# Patient Record
Sex: Female | Born: 1950
Health system: Southern US, Community
[De-identification: ages and names within clinical notes are randomized; demographics above are authoritative.]

## PROBLEM LIST (undated history)

## (undated) DIAGNOSIS — K219 Gastro-esophageal reflux disease without esophagitis: Secondary | ICD-10-CM

## (undated) DIAGNOSIS — F329 Major depressive disorder, single episode, unspecified: Secondary | ICD-10-CM

## (undated) DIAGNOSIS — F410 Panic disorder [episodic paroxysmal anxiety] without agoraphobia: Secondary | ICD-10-CM

## (undated) DIAGNOSIS — R42 Dizziness and giddiness: Secondary | ICD-10-CM

## (undated) DIAGNOSIS — N63 Unspecified lump in unspecified breast: Secondary | ICD-10-CM

## (undated) DIAGNOSIS — R238 Other skin changes: Secondary | ICD-10-CM

## (undated) DIAGNOSIS — H5347 Heteronymous bilateral field defects: Secondary | ICD-10-CM

## (undated) DIAGNOSIS — K5090 Crohn's disease, unspecified, without complications: Secondary | ICD-10-CM

## (undated) DIAGNOSIS — Z9889 Other specified postprocedural states: Secondary | ICD-10-CM

## (undated) DIAGNOSIS — R112 Nausea with vomiting, unspecified: Secondary | ICD-10-CM

## (undated) DIAGNOSIS — E119 Type 2 diabetes mellitus without complications: Secondary | ICD-10-CM

## (undated) DIAGNOSIS — R233 Spontaneous ecchymoses: Secondary | ICD-10-CM

## (undated) DIAGNOSIS — I639 Cerebral infarction, unspecified: Secondary | ICD-10-CM

## (undated) DIAGNOSIS — E039 Hypothyroidism, unspecified: Secondary | ICD-10-CM

## (undated) DIAGNOSIS — R269 Unspecified abnormalities of gait and mobility: Secondary | ICD-10-CM

## (undated) DIAGNOSIS — E785 Hyperlipidemia, unspecified: Secondary | ICD-10-CM

## (undated) DIAGNOSIS — I69398 Other sequelae of cerebral infarction: Principal | ICD-10-CM

## (undated) DIAGNOSIS — I69359 Hemiplegia and hemiparesis following cerebral infarction affecting unspecified side: Principal | ICD-10-CM

## (undated) DIAGNOSIS — I10 Essential (primary) hypertension: Secondary | ICD-10-CM

## (undated) DIAGNOSIS — F32A Depression, unspecified: Secondary | ICD-10-CM

## (undated) HISTORY — DX: Other skin changes: R23.8

## (undated) HISTORY — DX: Unspecified lump in unspecified breast: N63.0

## (undated) HISTORY — DX: Essential (primary) hypertension: I10

## (undated) HISTORY — DX: Panic disorder (episodic paroxysmal anxiety): F41.0

## (undated) HISTORY — DX: Hyperlipidemia, unspecified: E78.5

## (undated) HISTORY — DX: Spontaneous ecchymoses: R23.3

## (undated) HISTORY — DX: Crohn's disease, unspecified, without complications: K50.90

## (undated) HISTORY — DX: Hemiplegia and hemiparesis following cerebral infarction affecting unspecified side: I69.359

## (undated) HISTORY — DX: Other sequelae of cerebral infarction: I69.398

## (undated) HISTORY — DX: Depression, unspecified: F32.A

## (undated) HISTORY — DX: Unspecified abnormalities of gait and mobility: R26.9

## (undated) HISTORY — DX: Cerebral infarction, unspecified: I63.9

## (undated) HISTORY — DX: Gastro-esophageal reflux disease without esophagitis: K21.9

## (undated) HISTORY — DX: Major depressive disorder, single episode, unspecified: F32.9

## (undated) HISTORY — DX: Dizziness and giddiness: R42

---

## 1981-02-24 HISTORY — PX: TUBAL LIGATION: SHX77

## 1984-02-25 HISTORY — PX: APPENDECTOMY: SHX54

## 1984-02-25 HISTORY — PX: CHOLECYSTECTOMY: SHX55

## 1988-10-25 HISTORY — PX: BREAST LUMPECTOMY: SHX2

## 1991-05-26 HISTORY — PX: SMALL INTESTINE SURGERY: SHX150

## 1999-12-13 ENCOUNTER — Other Ambulatory Visit: Admission: RE | Admit: 1999-12-13 | Discharge: 1999-12-13 | Payer: Self-pay | Admitting: *Deleted

## 2000-10-13 ENCOUNTER — Encounter: Payer: Self-pay | Admitting: Gastroenterology

## 2000-10-14 ENCOUNTER — Inpatient Hospital Stay (HOSPITAL_COMMUNITY): Admission: EM | Admit: 2000-10-14 | Discharge: 2000-10-17 | Payer: Self-pay | Admitting: Emergency Medicine

## 2000-10-14 ENCOUNTER — Encounter: Payer: Self-pay | Admitting: Gastroenterology

## 2000-10-15 ENCOUNTER — Encounter: Payer: Self-pay | Admitting: General Surgery

## 2000-10-16 ENCOUNTER — Encounter: Payer: Self-pay | Admitting: Cardiology

## 2000-10-16 ENCOUNTER — Encounter: Payer: Self-pay | Admitting: Gastroenterology

## 2000-11-26 ENCOUNTER — Encounter: Admission: RE | Admit: 2000-11-26 | Discharge: 2000-11-26 | Payer: Self-pay | Admitting: Gastroenterology

## 2000-11-26 ENCOUNTER — Encounter: Payer: Self-pay | Admitting: Gastroenterology

## 2001-04-13 ENCOUNTER — Encounter: Payer: Self-pay | Admitting: Gastroenterology

## 2001-04-13 ENCOUNTER — Inpatient Hospital Stay (HOSPITAL_COMMUNITY): Admission: EM | Admit: 2001-04-13 | Discharge: 2001-04-15 | Payer: Self-pay | Admitting: Gastroenterology

## 2001-04-14 ENCOUNTER — Encounter: Payer: Self-pay | Admitting: General Surgery

## 2001-04-15 ENCOUNTER — Encounter: Payer: Self-pay | Admitting: Gastroenterology

## 2001-08-05 ENCOUNTER — Ambulatory Visit (HOSPITAL_COMMUNITY): Admission: RE | Admit: 2001-08-05 | Discharge: 2001-08-05 | Payer: Self-pay | Admitting: Gastroenterology

## 2002-05-16 ENCOUNTER — Other Ambulatory Visit: Admission: RE | Admit: 2002-05-16 | Discharge: 2002-05-16 | Payer: Self-pay | Admitting: *Deleted

## 2003-06-28 ENCOUNTER — Other Ambulatory Visit: Admission: RE | Admit: 2003-06-28 | Discharge: 2003-06-28 | Payer: Self-pay | Admitting: *Deleted

## 2004-01-24 ENCOUNTER — Encounter: Admission: RE | Admit: 2004-01-24 | Discharge: 2004-01-24 | Payer: Self-pay | Admitting: *Deleted

## 2004-02-25 HISTORY — PX: BREAST BIOPSY: SHX20

## 2004-05-07 ENCOUNTER — Emergency Department: Payer: Self-pay | Admitting: Emergency Medicine

## 2004-11-27 ENCOUNTER — Observation Stay (HOSPITAL_COMMUNITY): Admission: EM | Admit: 2004-11-27 | Discharge: 2004-11-28 | Payer: Self-pay | Admitting: Emergency Medicine

## 2005-02-07 ENCOUNTER — Encounter: Admission: RE | Admit: 2005-02-07 | Discharge: 2005-02-07 | Payer: Self-pay | Admitting: *Deleted

## 2005-05-13 ENCOUNTER — Ambulatory Visit: Payer: Self-pay | Admitting: General Practice

## 2005-05-25 ENCOUNTER — Ambulatory Visit: Payer: Self-pay | Admitting: General Practice

## 2005-06-08 ENCOUNTER — Emergency Department (HOSPITAL_COMMUNITY): Admission: EM | Admit: 2005-06-08 | Discharge: 2005-06-08 | Payer: Self-pay | Admitting: Emergency Medicine

## 2005-08-22 ENCOUNTER — Emergency Department: Payer: Self-pay | Admitting: Emergency Medicine

## 2006-02-24 HISTORY — PX: ANAL FISSURE REPAIR: SHX2312

## 2006-04-05 ENCOUNTER — Inpatient Hospital Stay: Payer: Self-pay | Admitting: Internal Medicine

## 2006-04-09 ENCOUNTER — Encounter: Admission: RE | Admit: 2006-04-09 | Discharge: 2006-04-09 | Payer: Self-pay | Admitting: *Deleted

## 2006-04-28 ENCOUNTER — Encounter: Admission: RE | Admit: 2006-04-28 | Discharge: 2006-04-28 | Payer: Self-pay | Admitting: *Deleted

## 2006-06-24 ENCOUNTER — Encounter: Admission: RE | Admit: 2006-06-24 | Discharge: 2006-06-24 | Payer: Self-pay | Admitting: Gastroenterology

## 2006-06-29 ENCOUNTER — Encounter: Admission: RE | Admit: 2006-06-29 | Discharge: 2006-06-29 | Payer: Self-pay | Admitting: Gastroenterology

## 2006-06-30 ENCOUNTER — Ambulatory Visit (HOSPITAL_COMMUNITY): Admission: AD | Admit: 2006-06-30 | Discharge: 2006-06-30 | Payer: Self-pay | Admitting: *Deleted

## 2006-09-23 ENCOUNTER — Ambulatory Visit (HOSPITAL_COMMUNITY): Admission: RE | Admit: 2006-09-23 | Discharge: 2006-09-23 | Payer: Self-pay | Admitting: *Deleted

## 2007-02-25 DIAGNOSIS — I639 Cerebral infarction, unspecified: Secondary | ICD-10-CM

## 2007-02-25 HISTORY — DX: Cerebral infarction, unspecified: I63.9

## 2007-03-22 ENCOUNTER — Ambulatory Visit: Payer: Self-pay | Admitting: Endocrinology

## 2007-05-06 ENCOUNTER — Ambulatory Visit: Payer: Self-pay | Admitting: Unknown Physician Specialty

## 2007-05-20 ENCOUNTER — Emergency Department: Payer: Self-pay | Admitting: Emergency Medicine

## 2007-05-21 ENCOUNTER — Inpatient Hospital Stay: Payer: Self-pay | Admitting: Internal Medicine

## 2007-05-21 ENCOUNTER — Other Ambulatory Visit: Payer: Self-pay

## 2007-05-26 ENCOUNTER — Ambulatory Visit: Payer: Self-pay | Admitting: Physical Medicine & Rehabilitation

## 2007-05-26 ENCOUNTER — Inpatient Hospital Stay (HOSPITAL_COMMUNITY)
Admission: AD | Admit: 2007-05-26 | Discharge: 2007-06-17 | Payer: Self-pay | Admitting: Physical Medicine & Rehabilitation

## 2007-06-17 ENCOUNTER — Encounter: Payer: Self-pay | Admitting: Internal Medicine

## 2007-06-25 ENCOUNTER — Encounter: Payer: Self-pay | Admitting: Internal Medicine

## 2007-07-26 ENCOUNTER — Encounter: Payer: Self-pay | Admitting: Internal Medicine

## 2007-07-29 ENCOUNTER — Encounter: Payer: Self-pay | Admitting: Internal Medicine

## 2007-08-25 ENCOUNTER — Encounter: Payer: Self-pay | Admitting: Internal Medicine

## 2007-09-09 ENCOUNTER — Encounter
Admission: RE | Admit: 2007-09-09 | Discharge: 2007-09-10 | Payer: Self-pay | Admitting: Physical Medicine & Rehabilitation

## 2007-09-10 ENCOUNTER — Ambulatory Visit: Payer: Self-pay | Admitting: Physical Medicine & Rehabilitation

## 2007-09-25 ENCOUNTER — Encounter: Payer: Self-pay | Admitting: Internal Medicine

## 2007-10-26 ENCOUNTER — Encounter: Payer: Self-pay | Admitting: Internal Medicine

## 2007-11-25 ENCOUNTER — Encounter: Payer: Self-pay | Admitting: Internal Medicine

## 2007-12-26 ENCOUNTER — Encounter: Payer: Self-pay | Admitting: Internal Medicine

## 2008-01-25 ENCOUNTER — Encounter: Payer: Self-pay | Admitting: Internal Medicine

## 2008-02-25 ENCOUNTER — Encounter: Payer: Self-pay | Admitting: Internal Medicine

## 2008-03-27 ENCOUNTER — Encounter: Payer: Self-pay | Admitting: Internal Medicine

## 2008-04-24 ENCOUNTER — Encounter: Payer: Self-pay | Admitting: Internal Medicine

## 2008-05-25 ENCOUNTER — Encounter: Payer: Self-pay | Admitting: Internal Medicine

## 2008-06-24 ENCOUNTER — Encounter: Payer: Self-pay | Admitting: Internal Medicine

## 2008-07-25 ENCOUNTER — Encounter: Payer: Self-pay | Admitting: Internal Medicine

## 2008-08-24 ENCOUNTER — Encounter: Payer: Self-pay | Admitting: Internal Medicine

## 2010-07-09 NOTE — Op Note (Signed)
NAMEERYNNE, KEALEY                 ACCOUNT NO.:  [id number]   MEDICAL RECORD NO.:  [id number]          PATIENT TYPE:  AMB   LOCATION:  SDS                          FACILITY:  [facility]   PHYSICIAN:  Jonne Ply, MD   DATE OF BIRTH:  [birth year]   DATE OF PROCEDURE:  09/23/2006  DATE OF DISCHARGE:                               OPERATIVE REPORT   PREOPERATIVE DIAGNOSIS:  History of Crohn's disease with perianal  abscess status post drainage now with fistula en ano.   POSTOPERATIVE DIAGNOSIS:  History of Crohn's disease with perianal  abscess status post drainage now with fistula en ano, trans-sphincteric.   PROCEDURE:  Fistulotomy.   SURGEON:  Jonne Ply, M.D.   ANESTHESIA:  General.   DESCRIPTION:  The patient was taken to the operating room and placed in  the supine position.  The fistulous opening in the right posterior  perianal space was probed with a rectal probe and a small opening was  seen at one of the anal crypts.  The skin and subcutaneous tissue was  opened along with a partial portion of the internal sphincter muscle.  Adequate hemostasis was ensured.  There was no active infection.  Surgicel was placed along with Gelfoam packing.  A sterile dressing was  applied.  The patient tolerated the procedure well and went to the PACU  in good condition.      Jonne Ply, MD  Electronically Signed     KRE/MEDQ  D:  09/23/2006  T:  09/24/2006  Job:  [id number]   cc:   Earle Gell, M.D.

## 2010-07-09 NOTE — Op Note (Signed)
Danielle, [name]                 ACCOUNT NO.:  [id number]   MEDICAL RECORD NO.:  [id number]          PATIENT TYPE:  AMB   LOCATION:  SDS                          FACILITY:  [facility]   PHYSICIAN:  Jonne Ply, MD   DATE OF BIRTH:  1950/06/22   DATE OF PROCEDURE:  09/23/2006  DATE OF DISCHARGE:  09/23/2006                               OPERATIVE REPORT   This is a repeat dictation.   PREOPERATIVE DIAGNOSIS:  Anal fistula with a history of Crohn's disease.   POSTOPERATIVE DIAGNOSIS:  Anal fistula with a history of Crohn's  disease.   PROCEDURE:  Fistulotomy.   ANESTHESIA:  General.   DESCRIPTION:  The patient was taken to the operating room and placed in  the supine position.  After adequate general anesthesia was induced  using endotracheal tube, the patient was placed in the lithotomy  position.  The perianal prep was undertaken with Betadine.  The  fistulous track was identified with an endoscope and a rectal probe.  This was opened with electrocautery through the external sphincter.  Adequate hemostasis was insured.  Gelfoam packing was placed.  The  patient tolerated the procedure well.  Sterile dressing was applied and  she was taken to PACU in good condition.      Jonne Ply, MD  Electronically Signed     KRE/MEDQ  D:  10/22/2006  T:  10/22/2006  Job:  [id number]

## 2010-07-09 NOTE — Assessment & Plan Note (Signed)
[name] returns to clinic today for followup evaluation accompanied by  her husband.  The patient is a 60 year old Caucasian female with a  history of diabetes mellitus along with Crohn disease.  She was admitted  to [facility] on May 21, 2007, with a 3-4 day history  of blurred vision and left facial numbness and tingling.  Initial  cranial CT was negative.  Followup cranial CT showed focal areas of low  attentuation in the left basal ganglia which were thought to be chronic  but no acute changes.  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  No significant  vertebrobasilar or carotid stenosis was identified.  Carotid Dopplers  showed no signs of significant plaque or stenosis.  Lumbar puncture  showed a glucose of 90 with negative culture.  Cardiac echo showed an  ejection fraction of 55% with mild aortic stenosis.  Repeat CT of the  brain on May 23, 2007, showed an involving right temporo-occipital  infarct with chronic small vessel disease.  It was recommended that she  will be placed on Aggrenox for stroke prophylaxis.   The patient stabilized and was moved to the Rehabilitation Unit at [facility] on May 26, 2007.  She remained there until June 17, 2007.   The patient has finished home health therapy and is now doing outpatient  therapy at [facility] including physical and occupational therapy  2 times per week and speech therapy is cutting back.  She did see her  primary care physician, Dr. Wynetta Emery, who apparently stopped the Aggrenox  secondary to potential diarrhea and placed her on aspirin.  She reports  that her blood sugars have been in the 105-135 range.  She still reports  poor vision.  She walks with a cane and uses a wheelchair when she  is  tired.  She walks at least 30 minutes per day.  She is continent of  bowel and bladder and is modified independent with bathing and dressing.   MEDICATIONS:  1. Levoxyl 150 mcg p.o. daily.  2. Protonix 40 mg daily.  3. Aspirin 81 mg daily.  4. Januvia 100 mg daily.  5. Altace 2.5 mg daily.  6. Zocor 10 mg daily.   REVIEW OF SYSTEMS:  Noncontributory.   PHYSICAL EXAMINATION:  GENERAL:  Reasonably well-appearing, middle aged  adult female, in mild-to-no acute discomfort.  VITAL SIGNS:  Not obtained in the office today.  EXTREMITIES:  She had 4+/5 strength throughout the right upper and right  lower extremity.  Left upper extremity strength showed approximately 3  to 3+/5 strength with decreased coordinated movements on the left hand.  Tone was increased in the left upper extremity.  Left lower extremity  strength was essentially 4-/5 with slightly increased tone.   IMPRESSION:  1. Status post right temporo-occipital infarct with left homonymous      hemianopsia and left hemisensory deficits along with left      hemiparesis of the arm and leg.  2. Type 2 diabetes mellitus.  3. Hypertension.  4. Dyslipidemia.  5. History of Crohn disease.  6. Hypothyroidism.  7. Situational depression.   In the office today, we did give the family the name of Dr. Frederico Hamman,  local neuro-ophthalmologist, that they can see in [facility] here for  problems related  to above-noted vision after the stroke.  She will be  continuing in outpatient therapies at this point.  At this point, I do  not think she is going to be able to return to work, and her husband is  considering starting the process to apply for disability.  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  I doubt that she would be able to return to that in the  foreseeable future.  We will plan on seeing the patient in followup on  an as needed basis as she continues to do reasonably well  recovering  from the above-noted stroke.           ______________________________  [name], M.D.     DC/MedQ  D:  09/13/2007 11:23:17  T:  09/14/2007 02:04:02  Job #:  [id number]

## 2010-07-09 NOTE — Discharge Summary (Signed)
[name], [name]                 ACCOUNT NO.:  [id number]   MEDICAL RECORD NO.:  [id number]          PATIENT TYPE:  IPS   LOCATION:  4031                         FACILITY:  [place]   PHYSICIAN:  Jarvis Morgan, M.D.   DATE OF BIRTH:  04-20-1950   DATE OF ADMISSION:  05/26/2007  DATE OF DISCHARGE:  06/17/2007                               DISCHARGE SUMMARY   DISCHARGE DIAGNOSES:  1. Right temporo-occipital cerebrovascular accident with left      hemiparesis, left homonymous hemianopsia, and left hemisensory      deficits, left facial droop, and mild dysphagia.  2. Diabetes mellitus type 2.  3. Hypertension.  4. Dyslipidemia.  5. History of Crohn's disease.  6. Hypothyroid.  7. Constipation, resolving.  8. Situational depression.   [name] is a 60 year old female with a history of diabetes mellitus,  Crohn's disease, admitted to [facility] on May 21, 2007, with a 3-4-day history of blurred vision and left facial numbness  and tingling.  CCT was initially negative.  Followup CCT showed focal  areas of low attenuation of left basal ganglia, it was chronic, no acute  changes.  CTA head and neck showed complete occlusion of proximal right  PCA, mild hypoplasia of left AC, and direct origin of left vertebral  artery from aortic arch.  No significant vertebrobasilar or carotid  stenosis.  Carotid ultrasound showed no plaque or stenosis.  LP done  showed increased glucose at 90, WBC without growth on culture.  Cardiac  echo showed EF of 55% with mild aortic stenosis.  Repeat CT of head on  May 23, 2007, showed evolving right temporo-occipital infarct, chronic  small-vessel disease.  Neuro recommends Aggrenox for CVA prophylaxis,  hypocoagulopathy panel drawn and currently pending.  TEE done showed no  thrombus and no shunt.  The patient continues with blurred vision,  decreased sensation on the left side with hypersensitivity, question  left homonymous  hemianopsia.  The patient has had issues with shortness  of breath and chest pain on May 26, 2007.  Bilateral lower extremity  Dopplers done were negative.  CTA chest negative for PE.  Currently, the  patient continues to have problems with mobility requiring +2 assist  with verbal cues to avoid objects on the left, requires cues to raise  left lower extremity.  Rehab consulted for further therapies.   PAST MEDICAL HISTORY:  Significant for:  1. Crohn's disease.  2. Anal fissure.  3. DM type 2.  4. Hypothyroid.  5. GERD.  6. Anemia in the past.  7. History of small bowel obstruction, requiring resection.  8. Cholecystectomy.  9. C-sections x2.  10.I&D perirectal abscess.   ALLERGIES:  CODEINE AND MORPHINE.   FAMILY HISTORY:  Positive for coronary artery disease and diabetes.   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  Quit tobacco 10 years ago.  Has 10-  pack-year history.  The patient was very active and walking  approximately 2 miles daily.   HOSPITAL COURSE:  [name] was admitted to rehab on May 26, 2007, for inpatient therapies to consist of PT, OT, and speech therapy.  Past admission, she was maintained on Aggrenox for CVA prophylaxis.  She  was noted to have situational depression with high levels of anxiety and  Dr. Valentina Shaggy, neuropsych was consulted for input.  She was noted to have  complaints of constipation and senna-S was added for treatment.   Labs done past admission revealed hemoglobin 12.0, hematocrit 35.8,  white count 9.3, and platelets 229,000.  Check of lytes revealed sodium  137, potassium 3.9, chloride 103, CO2 25, BUN 15, creatinine 0.85, and  glucose 151.  LFTs revealed SGOT 25, SGPT 19, alkaline phosphatase 15,  and total bilirubin 0.4.  The patient's blood sugars were checked on  a.c. and nightly during his  stay.  These have ranged from 80s to 130s  range.  Blood pressures checked on b.i.d. have been stable ranging from  16X to 096E systolic and 45W to 09W diastolic.  The patient has been  continent of bowel and bladder with toileting.  PVRs checked initially  showed evidence of retention.  However, with the patient's improved  mobility and getting the patient to bedside commode, this has resolved  issues with retention.   The patient's lability and depressive mood is much improving.  The  patient is better adjusting to deficits with improved overall outlook.  She did have a followup with General Surgery for her chronic anal  fistula and Dr. Excell Seltzer, who evaluated the patient felt that the  patient meets complete healing of anal fissure.  Therapies have been  ongoing during this stay.  The patient is currently at supervision level  for scoot, pivot, tub transfers, is noted to have increase in dynamic  standing balance with supervision with increase in functional use of  left upper extremity for ADL completion.  She is able to recall her  precautions for ADLs with minimal verbal cues showing increase in  attention to left side to compensate for left homonymous hemianopsia.  Last PT notes revealed the patient had min assist for ambulating 90 feet  with rolling walker with cues for correct left foot placement and  balance.  She is min to mod assist to ambulate around corners with  increase apraxia with tones.  She is able to ambulate up to 100 feet  with rolling walker with left foot Ace wrapped and braced.  Double  upright left AFO was ordered to help with her mobility.  Speech therapy  has been working with the patient to help complete oral motor  therapeutic exercises.  The patient is currently independent for this.  She is at supervision to maintain alternate attention in min distracting  environment.  She is supervision for solving mildly complex problems,  supervision to min assist to  utilize strategies to visually scan to the  left.  Currently, secondary to amount of assistance needed, her family  has elected on SNF.  Bed is available at [facility] for June 17, 2007, and the patient to be discharged to this facility with progressive  PT, OT, and speech therapy to continue past discharge.   DISCHARGE MEDICATIONS:  1. Levothyroxine 150 mcg p.o. per day.  2. Protonix 40 mg a day.  3. Januvia 100 mg p.o. per day.  4. Aggrenox one p.o. b.i.d.  5. Altace 2.5 mg p.o. nightly.  6. Zocor 10 mg p.o.  nightly.  7. Tylenol 650 mg p.o. q.12 hours.  8. Zofran 4 mg p.o. b.i.d.  9. Flexeril 5 mg p.o. nightly.  10.Senokot-S two p.o. b.i.d.  11.MiraLax 17 g and 8 ounces fluid p.o. per day.  12.Dulcolax suppository one p.o. per rectum daily p.r.n., no BM.  13.Ultram 50 mg p.o. q.i.d. p.r.n. pain.   DIET:  Carb modified medium.   SPECIAL INSTRUCTIONS:  A 24-hour supervision assistance as needed.  Continue monitoring blood sugars with a.c. and nightly CBG checks and  adjust oral hypoglycemic as needed.  Routine pressure relief measures.  Progressive PT, OT, and speech therapy to continue past discharge.   FOLLOWUP:  The patient to follow up with LMD for medical issues and for  results regarding coagulopathy panel.  Follow up with Dr. Theda Sers in 4-6  weeks.      Thornton Dales, P.A.    ______________________________  Jarvis Morgan, M.D.    PP/MEDQ  D:  06/16/2007  T:  06/17/2007  Job:  [id number]   cc:   Earle Gell, M.D.

## 2010-07-12 NOTE — Discharge Summary (Signed)
[facility]  Patient:    [name], [name] Visit Number: [id number] MRN: [id number]          Service Type: Attending:  Mickeal Skinner, M.D. Dictated by:   Mickeal Skinner, M.D. Adm. Date:  04/08/01 Disc. Date: 04/15/01   CC:         Marland Kitchen T. Hoxworth, M.D.   Discharge Summary  DISCHARGE DIAGNOSES: 1. Crohns ileitis complicated by early partial small bowel obstruction. 2. Iron-deficiency anemia.  DISCHARGE MEDICATIONS: 1. Pentasa 1 g t.i.d. 2. Aciphex 20 mg q.d. for gastroesophageal reflux. 3. Multivitamin with iron. 4. Cipro 500 mg b.i.d. x6 weeks; then 500 mg q.d. x6 weeks.  FOLLOWUP:  I will reassess Ms. Boan in my office in 4 to 6 weeks.  LABORATORY DATA:  Vitamin B12 level 409.  Serum ferratin 3.  Total cholesterol 156, HDL cholesterol 43, LDL cholesterol 90, triglycerides 117.  Reticulocyte count 64,500.  White blood cell count 12,100, hemoglobin 10.2, platelet count 304,000.  Comprehensive metabolic profile on admission to the hospital was normal except for a random glucose of 147.  SGOT 40.  Admission acute abdominal x-ray series was consistent with an early partial small bowel obstruction with no signs of cardiopulmonary disease.  HOSPITAL COURSE:  Ms. Skyann Ganim is a 60 year old female born [birth year].  She was diagnosed with Crohns ileitis approximately 18 years ago.  In May 1994, she presented with small bowel obstruction secondary to Crohns ileitis.  She had 12 cm of her terminal ileum resected.  The proximal ileum was re-anastomosed to the terminal ileum.  She also underwent an appendectomy and Meckels diverticulectomy.  On November 26, 2000, Ms. Bowens upper GI small-bowel follow-through x-ray series revealed Crohns ileitis associated with a stricture approximately 10 cm proximal the ileocecal valve.  Ms. Freehling presented to my office on April 08, 2001, with a 12 hour history of generalized abdominal  distension, abdominal cramps, nausea, and bilious vomiting.  Ms. Lodato was admitted through the hospital, but did not require nasogastric suction.  She preferred not being placed on prednisone or Solu-Medrol.  She was placed on intravenous Cipro which was switched to oral Cipro.  Her small bowel obstruction symptoms rapidly improved, to the point that she is able to be discharged on oral Cipro, Pentasa, multivitamin with iron, and Aciphex.  I will see Ms. Trickett in my office in followup. Dictated by:   Mickeal Skinner, M.D. Attending:  Mickeal Skinner, M.D. DD:  04/15/01 TD:  04/16/01 Job: [id number]

## 2010-07-12 NOTE — H&P (Signed)
[facility]  Patient:    [name], [name] Visit Number: [id number] MRN: [id number]          Service Type: Attending:  Mickeal Skinner, M.D. Dictated by:   Mickeal Skinner, M.D. Adm. Date:  04/13/01   CC:         Marland Kitchen T. Hoxworth, M.D.                         History and Physical  ADMISSION PROBLEM:  Crohns ileitis with early small-bowel obstruction.  HISTORY OF PRESENT ILLNESS:  Ms. [name] is a 60 year old female born [birth year].  Ms. [name] was diagnosed with Crohns ileitis approximately 18 years ago.  In May 1994, she presented with small-bowel obstruction secondary to Crohns ileitis.  She had 12 cm of her terminal ileum resected.  The proximal ileum was reanastomosed to the terminal ileum.  She also underwent an appendectomy and Meckels diverticulectomy.  On November 26, 2000, Ms. [name] upper GI and small-bowel follow-through x-ray series revealed Crohns ileitis associated with a stricture approximately 10 cm proximal to the ileocecal valve.  She was treated with Pentasa 750 mg t.i.d. plus Cipro 500 mg b.i.d. for six weeks and then 500 mg daily for six weeks.  Ms. [name] presented to my office April 13, 2001, with a 12-hour history of generalized abdominal distention, abdominal cramps, nausea, and bilious vomiting.  Prior to coming to my office she did have a small bowel movement.  MEDICATION ALLERGIES:  CODEINE.  CHRONIC MEDICATIONS: 1. Pentasa 750 mg t.i.d. 2. Aciphex 20 mg daily. 3. Multivitamin with iron.  PAST MEDICAL HISTORY: 1. May 1994, 12 cm of terminal ileum resected to treat obstructing Crohns    ileitis.  The proximal ileum was reanastomosed to the terminal ileum.    Incidental appendectomy and Meckels diverticulectomy performed. 2. September 2002, [place] hospitalization with small-bowel obstruction.    CT scan of the abdomen and pelvis revealed a fatty liver.  Upper GI and    small-bowel  follow-through x-ray series revealed Crohns ileitis with a    stricture 10 cm proximal to the ileocecal valve. 3. Chronic iron deficiency anemia. 4. Remote open cholecystectomy in 1985. 5. Gastroesophageal reflux disease. 6. June 01, 1992, esophagogastroduodenoscopy and proctocolonoscopy to the    cecum were normal. 7. Two remote cesarean sections.  HABITS:  Ms. [name] does not smoke cigarettes or consume alcohol.  FAMILY HISTORY:  Noncontributory.  PHYSICAL EXAMINATION:  VITAL SIGNS:  Weight 185 pounds (80 kg).  Blood pressure 140/90.  GENERAL:  Ms. [name] is ambulatory and alert.  HEENT:  Sclerae nonicteric.  Oropharynx normal.  LUNGS:  Clear to auscultation.  CARDIAC:  Regular rhythm.  Without murmurs, clicks, or rubs.  ABDOMEN:  Soft but slightly distended.  Diminished bowel sounds.  Generalized abdominal discomfort to palpation without signs of peritonitis.  EXTREMITIES:  No edema.  ASSESSMENT: 1. Early partial small-bowel obstruction. 2. November 26, 2000, upper gastrointestinal and small-bowel follow-through    x-ray series revealed Crohns ileitis complicated by a stricture 10 cm    proximal to the ileocecal valve. 3. May 1994, obstructing Crohns disease, required resection of 12 cm distal    ileum with the proximal ileum reanastomosed to the terminal ileum plus    appendectomy and Meckels diverticulectomy.  PLAN:  Ms. [name] requests that I not use intravenous Solu-Medrol or oral prednisone due to side effects she has experienced in the past (insomnia).  I will start her on intravenous Cipro.  I have asked Dr. Adonis Housekeeper to evaluate Ms. [name] for her ileal stricture. Dictated by:   Mickeal Skinner, M.D. Attending:  Mickeal Skinner, M.D. DD:  04/14/01 TD:  04/14/01 Job: [id number]

## 2010-07-12 NOTE — Discharge Summary (Signed)
[facility]  Patient:    [name], [name] Visit Number: [id number] MRN: [id number]          Service Type: Attending:  Mickeal Skinner, M.D. Dictated by:   Mickeal Skinner, M.D. Disc. Date: 10/17/00                             Discharge Summary  DISCHARGE DIAGNOSES: 1. Resolved small bowel obstruction, etiology undetermined. 2. History of Crohns ileitis. 3. Distal ileal resection to treat obstructing Crohns ileitis in 1995. 4. Iron-deficiency anemia.  DISCHARGE MEDICATIONS: 1. Cipro 500 mg b.i.d. x 6 weeks; then 500 mg q.d. x 6 weeks. 2. Multivitamin with iron b.i.d. 3. Aciphex 20 mg q.a.m.  OFFICE FOLLOWUP:  I will reevaluate [name] in my office in 2 to 4 weeks.  LABORATORY DATA:  White blood cell count 7600, hemoglobin 9.9 g, platelet count normal.  Comprehensive metabolic panel normal.  Serum amylase normal. Serum iron saturation 9%.  Urinalysis normal.  C. difficile toxin negative. Acute abdominal x-ray series on admission was consistent with distal small bowel obstruction.  CT scan of the abdomen and pelvis reveals fatty infiltration of the liver, status post cholecystectomy, upper limits of normal caliber small bowel loops without evidence of bowel obstruction.  HOSPITAL COURSE:  [name] is a 60 year old female.  She has a history of chronic Crohns ileitis diagnosed 17 years ago.  In 1995, she presented with ileal obstruction secondary to her Crohns disease and required a terminal ileal resection by Dr. Adonis Housekeeper.  [name] was admitted to the hospital on October 14, 2000, by Dr. Mikki Santee Buccini with signs and symptoms consistent with a partial small bowel obstruction. She was placed on intravenous Cipro and intravenous Protonix.  She did not require nasogastric suction.  With observation, her small bowel obstruction resolved spontaneously without surgery.  CT scan of the abdomen and pelvis at the end of her  hospitalization was unremarkable.  There was no signs of recurrent Crohns disease.  [name] was discharged in stable medical condition.  She is on oral Cipro to treat possible recurrent Crohns ileitis, a multivitamin with iron to treat iron-deficiency anemia, and oral Aciphex.  I will see her back in my office in 2 to 4 weeks. Dictated by:   Mickeal Skinner, M.D. Attending:  Mickeal Skinner, M.D. DD:  10/21/00 TD:  10/21/00 Job: [id number]

## 2010-07-12 NOTE — H&P (Signed)
[facility]  Patient:    [name], [name] Visit Number: [id number] MRN: [id number]          Service Type: MED Location: 3W 5701 01 Attending Physician:  Mauri Brooklyn Ii Adm. Date:  10/13/2000   CC:         Mickeal Skinner, M.D.   History and Physical  HISTORY OF PRESENT ILLNESS:  This 60 year old female is being admitted to the hospital because of an apparent small-bowel obstruction.  [name] is a patient of Dr. [name] III, with a history of Crohns disease diagnosed approximately 17 years ago and requiring a small bowel resection approximately 8 years ago.  She has been doing well and has not required any ongoing medical therapy.  In recent months, she has had occasional brief twinges of abdominal pain but this afternoon around 3 oclock, developed fairly severe abdominal pain, colicky in character, migratory across her abdomen, consistent with waves of peristalsis, and finally culminating in multiple episodes of vomiting with some relief of the pain, however, the nausea and pain started to recur thereafter and it was felt prudent to have her come to the emergency room for evaluation.  She has felt chilled with this but has not had any fevers.  Her last bowel movement was earlier today and was normal in character.  ALLERGIES:  CODEINE (passes out).  OUTPATIENT MEDICATIONS:  Aciphex 20 mg daily.  OPERATIONS:  Terminal ileal resection with incidental appendectomy around 1995 by Dr. Marland Kitchen T. Hoxworth.  Open cholecystectomy around [place] prior to diagnosis of Crohns disease.  PAST MEDICAL HISTORY:  Medical illnesses:  Crohns disease diagnosed approximately 17 years ago.  No other significant medical conditions other than GERD.  No known cardiopulmonary disease, diabetes or hypertension.  HABITS:  Nonsmoker, nondrinker.  FAMILY HISTORY:  Negative for inflammatory bowel disease except for a questionable history of some sort of  intestinal condition in her father. Negative for gallstones, colon cancer or ulcers.  SOCIAL HISTORY:  The patient is married with two children.  She is originally from [place].  Currently, she works part-time at [employer] and also does some real estate work and she is taking college-level studies at [employer] in [employer] in religious studies.  REVIEW OF SYSTEMS:  Generally negative.  Prior to the current illness, the patient would get periodic mild twinges of abdominal pain but by and large, she does not have any chronic abdominal pain; no problem with anorexia, weight loss or fatigue; no ongoing extra-intestinal manifestations of inflammatory bowel disease such as ocular inflammation, skin rashes, oral ulcerations or significant arthritis.  The patient does have a tendency toward heartburn and right shoulder pain related to her GERD, but these symptoms are well-controlled by Aciphex.  No problem with dysphagia.  No nausea.  No rectal bleeding.  From the lower GI perspective, the patient actually has alternating constipation and diarrhea.  She might go as many as two days without a bowel movement, and then have a succession of multiple loose bowel movements at the end of that period.  No problem with shortness of breath, cough or chest pain.  PHYSICAL EXAMINATION:  GENERAL:  Ms. Spike is an overweight, pleasant, generally healthy-appearing female who is in not in any acute distress but appears perhaps slightly uncomfortable.  VITAL SIGNS:  Temperature 98.4, blood pressure 116/86, pulse 92, respirations 20 and unlabored.  HEENT:  Anicteric.  No conjunctival pallor.  Oropharynx benign without ulcerations or thrush.  CHEST:  Clear to auscultation.  HEART:  No gallops, rubs, murmurs, clicks or arrhythmias appreciated.  ABDOMEN:  Bowel sounds are actually rather quiet and sparse at this time.  The patient has well-healed incisions without any obvious ventral or  incisional herniae.  No organomegaly, guarding, mass or significant tenderness are present at this time, although subjectively, she does report a little bit of tenderness to palpation.  RECTAL:  Examination demonstrates well-formed but not hard, brown, Hemoccult-negative stool, without rectal masses.  NEUROLOGIC:  The patient is alert, coherent, appropriate and without obvious cranial nerve or focal motor deficits.  LABORATORY DATA:  WBC 13,800, hemoglobin 10.9 with an MCV of 70, platelets 141,000.  Metabolic panel generally unremarkable.  Glucose 140 (nonfasting). BUN 8, creatinine 0.8, albumin of 4.2, SGOT slightly elevated at 44, other liver chemistries within normal limits.  Amylase normal at 59.  Three-way abdominal series (my interpretation):  No free air.  Dilated stepladdered small bowel loops in the upper abdomen consistent with SBO. Small air-fluid levels elsewhere.  IMPRESSION: 1. Small-bowel obstruction.  Differential diagnosis would include Crohns    exacerbation with ileal edema leading to obstruction, progressive    stricturing secondary to Crohns disease leading to obstruction, plugging    of a narrowed segment of the patients ileum with food debris leading to    acute obstruction, or intestinal obstruction due to adhesions. 2. Long-standing Crohns disease, relatively clinically quiescent with minimal    symptomatology despite absence of medications (has had occasional courses    of Cipro for apparent mild flare-ups now and then). 3. Status post resection of terminal ileum, status post open cholecystectomy. 4. Microcytic anemia.  PLAN:  The patient will be given hydration overnight and, as needed, symptomatic medications such as antiemetic and analgesic medications intravenously.  We will repeat films in the morning.  Depending on her clinical evolution, NG suction may be needed, and/or a CT scan of the abdomen to better delineate the nature of the process causing  her obstruction and  associated symptoms.  Attending Physician:  Mauri Brooklyn Ii DD:  10/14/00 TD:  10/14/00 Job: [id number]

## 2010-07-12 NOTE — Procedures (Signed)
[place]  Patient:    [name], [name] Visit Number: [id number] MRN: [id number]          Service Type: END Location: ENDO Attending Physician:  Mauri Brooklyn Ii Dictated by:   Mickeal Skinner, M.D. Proc. Date: 08/05/01 Admit Date:  08/05/2001 Discharge Date: 08/05/2001   CC:         Earnstine Regal, M.D.   Procedure Report  PROCEDURE:  Colonoscopy.  INDICATIONS FOR PROCEDURE:  The patient is a 60 year old female born 1950/08/05.  The patient was diagnosed with Crohns ileitis in 1986.  On June 01, 1992, esophagogastroduodenoscopy, proctocolonoscopy to the cecum with distal ileoscopy were normal.  In 1995, the patient presented with obstructing Crohns ileitis; she underwent a segmental partial ileal resection.  The proximal ileum was reanastomosed to her normal terminal ileum.  She also underwent a Meckels diverticulectomy and appendectomy.  The patient was hospitalized at [facility] in September 2002, with a partial small bowel obstruction.  CT scan of the abdomen and pelvis revealed fatty infiltration of the liver.  Her small bowel obstruction resolved.  On November 26, 2000, her upper GI small bowel follow through x-ray series revealed either Crohns ileitis or an anastomotic stricture in the distal ileum.  The patient has intermittent small bowel obstructive-type symptoms.  She is undergoing diagnostic colonoscopy and distal ileoscopy to rule out Crohns ileocolitis.  PAST MEDICAL HISTORY:  Iron-deficiency anemia, primary hypothyroidism.  ENDOSCOPIST:  Mickeal Skinner, M.D.  PREMEDICATION:  Versed 10 mg and Demerol 50 mg.  ENDOSCOPE:  Olympus pediatric colonoscope.  DESCRIPTION OF PROCEDURE:  After obtaining informed consent, the patient was placed in the left lateral decubitus position.  I administered intravenous Demerol and intravenous Versed to achieve conscious sedation for the procedure.  The  patients blood pressure, oxygen saturation, and cardiac rhythm were monitored throughout the procedure, and documented in the medical record.  Anal inspection was normal.  Digital rectal exam was normal.  The Olympus pediatric video colonoscope was introduced into the rectum and advanced to the cecum.  A widely patent normal-appearing ileocecal valve was intubated and the mid - distal ileum was easily inspected including the surgical anastomosis in the ileum.  Colonic preparation for the exam today was excellent.  Rectum normal.  Sigmoid colon and descending colon normal.  Splenic flexure normal.  Transverse colon normal.  Hepatic flexure normal.  Ascending colon normal.  Cecum and ileocecal valve normal.  Mid - distal ileum normal.  There is a mild stricture at the ileal surgical anastomosis, but no signs of Crohns ileitis.  ASSESSMENT:  Normal proctocolonoscopy to the cecum.  Widely patent normal-appearing ileocecal valve.  Normal mid - distal ileum except for the presence of a mild stricture at the ileal surgical anastomoses which offers minimal resistance to passage of the pediatric colonoscope. Dictated by:   Mickeal Skinner, M.D. Attending Physician:  Mauri Brooklyn Ii DD:  08/05/01 TD:  08/07/01 Job: [id number]

## 2010-07-12 NOTE — Op Note (Signed)
NAMEJERELENE, SALAAM                 ACCOUNT NO.:  [id number]   MEDICAL RECORD NO.:  [id number]          PATIENT TYPE:  AMB   LOCATION:  DAY                          FACILITY:  [facility]   PHYSICIAN:  Jonne Ply, MD   DATE OF BIRTH:  Mar 21, 1950   DATE OF PROCEDURE:  06/30/2006  DATE OF DISCHARGE:  06/30/2006                               OPERATIVE REPORT   PREOPERATIVE DIAGNOSIS:  Perirectal abscess.   POSTOPERATIVE DIAGNOSIS:  Perirectal abscess.   PROCEDURE:  Incision and drainage of perirectal abscess.   SURGEON:  Jonne Ply, M.D.   ANESTHESIA:  General.   DESCRIPTION OF PROCEDURE:  The patient was taken to the operating room  and placed in the lithotomy position.  Perianal and rectal prep were  undertaken.  Area of induration was identified on the right lateral  buttock and gluteal cleft.  This was incised and drained easily and  packed opened.  The patient was advised to be discharged, do Sitz baths.  Medications include Vicodin.  The patient went to the recovery room in  good condition.      Jonne Ply, MD  Electronically Signed     KRE/MEDQ  D:  07/22/2006  T:  07/22/2006  Job:  [id number]

## 2010-07-12 NOTE — H&P (Signed)
NAMEHAROLYN, [name]                 ACCOUNT NO.:  [id number]   MEDICAL RECORD NO.:  [id number]          PATIENT TYPE:  INP   LOCATION:  5010                         FACILITY:  [facility]   PHYSICIAN:  Earle Gell, M.D.   DATE OF BIRTH:  [birth year]   DATE OF ADMISSION:  11/27/2004  DATE OF DISCHARGE:                                HISTORY & PHYSICAL   ADMITTING DIAGNOSIS:  Lower gastrointestinal bleeding (painless  hematochezia).   HISTORY:  Ms. [name] is a 60 year old female born Jun 01, 1950. Ms.  [name] has had chronic Crohn's ileitis for approximately 20 years but no  history of Crohn's colitis. Her June 2003 proctocolonoscopy to the cecum was  normal. She has chronic iron deficiency anemia.   Ms. [name] has had three episodes of painless hematochezia since this morning  unassociated with anorectal pain or abdominal pain. She does not use  nonsteroidal antiinflammatory drugs. She has been feeling well.   MEDICATION ALLERGIES:  CODEINE.   CHRONIC MEDICATIONS:  1.  Levoxyl 150 mcg daily.  2.  Zoloft 50 mg daily.  3.  AcipHex 20 mg daily.   PAST MEDICAL HISTORY:  1.  Chronic Crohn's ileitis.  2.  May 1994 resection of 12 cm ileum, anastomosing the proximal ileum to      the terminal ileum to manage obstructing Crohn's ileitis.  3.  Remote appendectomy.  4.  Open cholecystectomy.  5.  Meckel's diverticulectomy.  6.  Chronic iron deficiency anemia.  7.  Gastroesophageal reflux disease.  8.  In 1994, esophagogastroduodenoscopy and colonoscopy normal.  9.  Primary hypothyroidism.  10. June 2003, proctocolonoscopy to the cecum normal. Inspection of the      ileum revealed a mild stricture at the ileoileal surgical anastomosis.  11. Two previous cesarean sections.  12. Nonmelanoma skin cancer removed from the left shoulder.  13. Anxiety/depression.   FAMILY HISTORY:  Noncontributory.   HABITS:  [name] does not smoke cigarettes or consume alcohol.   PHYSICAL EXAMINATION:   GENERAL:  [name] is alert and feels well.  HEENT:  Nonicteric sclerae.  LUNGS:  Clear to auscultation.  CARDIAC:  Reveals a regular rhythm without murmurs, clicks, or rubs.  ABDOMEN:  Soft, flat, and nontender.  SKIN:  Reveals a tattoo on her right forearm.   ASSESSMENT:  Painless hematochezia, rule out internal hemorrhoidal bleed,  diverticular bleed, vascular ectasia bleed.   PLAN:  Monitor hemoglobin.           ______________________________  Earle Gell, M.D.     MJ/MEDQ  D:  11/27/2004  T:  11/27/2004  Job:  [id number]

## 2010-07-12 NOTE — Discharge Summary (Signed)
NAMEJOSHUA, [name]                 ACCOUNT NO.:  [id number]   MEDICAL RECORD NO.:  [id number]          PATIENT TYPE:  INP   LOCATION:  5010                         FACILITY:  [facility] [facility]   PHYSICIAN:  Earle Gell, M.D.   DATE OF BIRTH:  January 21, 1951   DATE OF ADMISSION:  11/27/2004  DATE OF DISCHARGE:  11/28/2004                                 DISCHARGE SUMMARY   DISCHARGE DIAGNOSES:  1.  Lower gastrointestinal bleeding (painless hematochezia), resolved.  2.  New-onset type 2 diabetes mellitus (fasting blood sugar 192;  hemoglobin      A1c pending).   DISCHARGE MEDICATIONS:  1.  Levoxyl 150 mcg daily.  2.  Zoloft 25 mg daily for one week and then discontinue Zoloft.  3.  Aciphex 20 mg each morning.   OFFICE FOLLOWUP:  [name] will perform home glucose monitoring for the next  two weeks, and I will see her back in the office in two weeks and obtain a  repeat hemoglobin.   HOSPITAL COURSE:  [name] is a 60 year old female, born 1950/12/07.  Ms. Eyman has had chronic Crohn's ileitis for approximately 20 years,  but there is no history of colitis.  In June, 2003, her proctocolonoscopy to  the cecum was normal.  [name] has chronic iron deficiency anemia.   Ms. Driver was admitted to the hospital yesterday afternoon following three  episodes of painless hematochezia earlier in the morning.  She reported no  significant abdominal pain and no anorectal pain.  She does not use  nonsteroidal anti-inflammatory drugs.  She had been feeling well.   Her admission hemoglobin to the hospital was 10.5 g.  Twelve hours post-  hospitalization, her hemoglobin was 9.5 g.  Approximately 24 hours post-  hospitalization, her hemoglobin was 9.4 g.  Her lower gastrointestinal  bleeding has resolved.  I do not plan to repeat a colonoscopy or flexible  proctosigmoidoscopy.   [name]'s random blood sugar on admission to the hospital was 205.  Her  fasting blood sugar the morning of discharge was  192.  Her hemoglobin A1c is  pending.  Her mother had diabetes mellitus.  Her older brother had  prediabetes.   [name] has done blood sugar monitoring for her mother in the past, and I will  have her perform home glucose monitoring over the next two weeks and re-  evaluate her in the office.   [name] is discharged this morning in stable medical condition.   MEDICATION ALLERGIES:  CODEINE.   PAST MEDICAL HISTORY:  Chronic Crohn's ileitis;  May, 1994:  Resection of 12  cm of ileum, anastomosing the proximal ileum to the terminal ileum to manage  obstructing Crohn's ileitis, remote appendectomy, open cholecystectomy,  Meckel's diverticulectomy, chronic iron deficiency anemia, chronic  gastroesophageal reflux  associated with a normal esophagogastroduodenoscopy in 1994,  proctocolonoscopy to the cecum normal in 1994 in June, 2003, primary  hypothyroidism, two previous cesarean sections, non-melanoma skin cancer  removed from the left shoulder, anxiety/depression.           ______________________________  Earle Gell, M.D.  MJ/MEDQ  D:  11/28/2004  T:  11/28/2004  Job:  [id number]

## 2010-11-19 LAB — URINALYSIS, ROUTINE W REFLEX MICROSCOPIC
Bilirubin Urine: NEGATIVE
Bilirubin Urine: NEGATIVE
Glucose, UA: NEGATIVE
Glucose, UA: NEGATIVE
Hgb urine dipstick: NEGATIVE
Hgb urine dipstick: NEGATIVE
Ketones, ur: NEGATIVE
Ketones, ur: NEGATIVE
Nitrite: NEGATIVE
Nitrite: NEGATIVE
Protein, ur: NEGATIVE
Protein, ur: NEGATIVE
Specific Gravity, Urine: 1.012
Specific Gravity, Urine: 1.037 — ABNORMAL HIGH
Urobilinogen, UA: 0.2
Urobilinogen, UA: 1
pH: 6
pH: 6.5

## 2010-11-19 LAB — URINE CULTURE
Colony Count: 70000
Colony Count: NO GROWTH
Culture: NO GROWTH
Special Requests: NEGATIVE

## 2010-11-19 LAB — COMPREHENSIVE METABOLIC PANEL
ALT: 19
AST: 25
Albumin: 3.4 — ABNORMAL LOW
Alkaline Phosphatase: 57
BUN: 15
CO2: 25
Calcium: 9.3
Chloride: 103
Creatinine, Ser: 0.85
GFR calc Af Amer: >60
GFR calc non Af Amer: >60
Glucose, Bld: 151 — ABNORMAL HIGH
Potassium: 3.9
Sodium: 137
Total Bilirubin: 0.4
Total Protein: 6.5

## 2010-11-19 LAB — CBC
HCT: 35.8 — ABNORMAL LOW
Hemoglobin: 12
MCHC: 33.5
MCV: 75.7 — ABNORMAL LOW
Platelets: 229
RBC: 4.74
RDW: 15.8 — ABNORMAL HIGH
WBC: 9.3

## 2010-11-19 LAB — DIFFERENTIAL
Basophils Absolute: 0
Basophils Relative: 0
Eosinophils Absolute: 0.2
Eosinophils Relative: 2
Lymphocytes Relative: 28
Lymphs Abs: 2.6
Monocytes Absolute: 0.7
Monocytes Relative: 7
Neutro Abs: 5.9
Neutrophils Relative %: 63

## 2010-11-19 LAB — URINE MICROSCOPIC-ADD ON

## 2010-12-09 LAB — CBC
HCT: 37.6
Hemoglobin: 12.3
MCHC: 32.7
MCV: 75.9 — ABNORMAL LOW
Platelets: 267
RBC: 4.95
RDW: 21.2 — ABNORMAL HIGH
WBC: 7

## 2010-12-09 LAB — BASIC METABOLIC PANEL
BUN: 11
CO2: 25
Calcium: 9.5
Chloride: 103
Creatinine, Ser: 0.61
GFR calc Af Amer: >60
GFR calc non Af Amer: >60
Glucose, Bld: 121 — ABNORMAL HIGH
Potassium: 4.2
Sodium: 136

## 2011-04-01 ENCOUNTER — Telehealth (INDEPENDENT_AMBULATORY_CARE_PROVIDER_SITE_OTHER): Payer: Self-pay

## 2011-04-01 NOTE — Telephone Encounter (Signed)
Received voice message from [facility] requesting appointment for [name].

## 2011-04-02 NOTE — Telephone Encounter (Signed)
Spoke with [name] regarding her upcoming appointment with [name] for evaluation of new breast lump, patient states her last mammogram was in 2008.  Spoke to [name] @ [name] office, they will order a mammogram and have films sent to our office prior to patient appointment.  [name] has a cyst on her chin she would like to have removed, I informed patient that she would need to see a Plastic Surgeon due to location.  Patient scheduled for 04/17/11 w/[name] for Breast Lump Evaluation.

## 2011-04-03 ENCOUNTER — Ambulatory Visit: Payer: Self-pay

## 2011-04-17 ENCOUNTER — Ambulatory Visit (INDEPENDENT_AMBULATORY_CARE_PROVIDER_SITE_OTHER): Payer: MEDICARE | Admitting: General Surgery

## 2011-04-17 ENCOUNTER — Encounter (INDEPENDENT_AMBULATORY_CARE_PROVIDER_SITE_OTHER): Payer: Self-pay | Admitting: General Surgery

## 2011-04-17 VITALS — BP 126/72 | HR 68 | Temp 97.4°F | Resp 16 | Ht 64.5 in | Wt 168.8 lb

## 2011-04-17 DIAGNOSIS — N63 Unspecified lump in unspecified breast: Secondary | ICD-10-CM

## 2011-04-17 NOTE — Progress Notes (Signed)
Subjective:   Possible right breast lump  Patient ID: [name], female   DOB: 18-May-1950, 61 y.o.   MRN: [id number]  HPI The patient comes to the office referred through the courtesy of Dr. Earle Gell for a possible right breast mass. The patient states that she noted a very small soft lump just adjacent to her right nipple on self exam about one month ago. It has not really changed since she discovered. She does have a remote history of a lipoma removed from the right breast about 15 years ago. She has no family history of breast cancer. She had mammogram and ultrasound performed in evaluation.  Past Medical History  Diagnosis Date  . Diabetes mellitus   . GERD (gastroesophageal reflux disease)   . Hyperlipidemia   . Hypertension   . Stroke   . Thyroid disease   . Visual disturbance     Patient has vision loss.  . Constipation   . Bruises easily   . Lump in female breast    Past Surgical History  Procedure Date  . Cesarean section 1981  . Cesarean section 1983  . Cholecystectomy 1986  . Small intestine surgery 05/1991  . Av fistula repair 2008  . Breast surgery    Current Outpatient Prescriptions  Medication Sig Dispense Refill  . aspirin 81 MG tablet Take 81 mg by mouth daily.      . cyclobenzaprine (FLEXERIL) 5 MG tablet Daily.      Marland Kitchen JANUVIA 100 MG tablet Daily.      Marland Kitchen levothyroxine (SYNTHROID, LEVOTHROID) 125 MCG tablet Daily.      . pantoprazole (PROTONIX) 40 MG tablet Daily.      . ramipril (ALTACE) 2.5 MG capsule Daily.      . simvastatin (ZOCOR) 10 MG tablet Daily.       Allergies  Allergen Reactions  . Codeine Other (See Comments)    Syncope.  . Morphine And Related Nausea And Vomiting and Other (See Comments)    Hallucinations.  . Sulfur Itching    Per patient happened years ago.     Review of Systems  Respiratory: Negative for shortness of breath.   Cardiovascular: Negative for chest pain.  Gastrointestinal: Negative for abdominal pain.    Neurological: Positive for dizziness, weakness and numbness.       Objective:   Physical Exam General: Alert and well-developed in no distress Skin: No rash or infection HEENT: No palpable thyromegaly. There is a tiny subcutaneous probably sebaceous cyst. 4 mm just beneath her chin noninflamed. Lymph nodes: No palpable cervical, supraclavicular, or axillary nodes Breasts: Right breast slightly atrophic. Just medial to the areolar edge is a very small rounded superficial density probably just a few millimeters. I think this represents just a surface or subcutaneous irregularity. No dominant mass is really in either breast.  Imaging: I reviewed mammogram and ultrasound reports.  With attention directed to the area of concern there was no abnormality seen on either exam    Assessment:     Possible small right breast mass most consistent with just irregular subcutaneous or breast tissue. I don't think this represents a clinically dominant mass in all her imaging is negative.    Plan:     Will return in 3 months for reexamination to confirm stability.

## 2011-06-19 ENCOUNTER — Encounter (INDEPENDENT_AMBULATORY_CARE_PROVIDER_SITE_OTHER): Payer: Self-pay

## 2013-01-17 ENCOUNTER — Telehealth: Payer: Self-pay | Admitting: Neurology

## 2013-01-17 NOTE — Telephone Encounter (Signed)
I called patient. She has not been seen through this practice yet. The patient is having new onset headaches, had to be rescheduled from her December 5 appointment. I'll try to get her into the office as soon as possible.

## 2013-01-17 NOTE — Telephone Encounter (Signed)
Spoke with patient and she said that the pain is coming and going, don't think its anything serious, will call back to reschedule appt.

## 2013-01-17 NOTE — Telephone Encounter (Signed)
Called patient to sched appt, left VM scheduled appt, patient to call back to confirm, sooner appt

## 2013-01-18 ENCOUNTER — Ambulatory Visit: Payer: Self-pay | Admitting: Neurology

## 2013-01-26 ENCOUNTER — Encounter: Payer: Self-pay | Admitting: Neurology

## 2013-01-26 ENCOUNTER — Ambulatory Visit (INDEPENDENT_AMBULATORY_CARE_PROVIDER_SITE_OTHER): Payer: Medicare Other | Admitting: Neurology

## 2013-01-26 ENCOUNTER — Telehealth: Payer: Self-pay | Admitting: Neurology

## 2013-01-26 VITALS — BP 126/77 | HR 96 | Ht 64.5 in | Wt 169.0 lb

## 2013-01-26 DIAGNOSIS — R269 Unspecified abnormalities of gait and mobility: Secondary | ICD-10-CM | POA: Insufficient documentation

## 2013-01-26 DIAGNOSIS — R2681 Unsteadiness on feet: Secondary | ICD-10-CM | POA: Insufficient documentation

## 2013-01-26 DIAGNOSIS — I69998 Other sequelae following unspecified cerebrovascular disease: Secondary | ICD-10-CM

## 2013-01-26 DIAGNOSIS — I69359 Hemiplegia and hemiparesis following cerebral infarction affecting unspecified side: Secondary | ICD-10-CM

## 2013-01-26 DIAGNOSIS — R42 Dizziness and giddiness: Secondary | ICD-10-CM

## 2013-01-26 DIAGNOSIS — I69959 Hemiplegia and hemiparesis following unspecified cerebrovascular disease affecting unspecified side: Secondary | ICD-10-CM

## 2013-01-26 HISTORY — DX: Unspecified abnormalities of gait and mobility: R26.9

## 2013-01-26 HISTORY — DX: Hemiplegia and hemiparesis following cerebral infarction affecting unspecified side: I69.359

## 2013-01-26 HISTORY — DX: Dizziness and giddiness: R42

## 2013-01-26 MED ORDER — MECLIZINE HCL 25 MG PO TABS: 25.0000 mg | ORAL_TABLET | Freq: Three times a day (TID) | ORAL | Status: DC | PRN

## 2013-01-26 MED ORDER — TIZANIDINE HCL 4 MG PO TABS: 4.0000 mg | ORAL_TABLET | Freq: Every day | ORAL | Status: DC

## 2013-01-26 NOTE — Telephone Encounter (Signed)
I called patient. The patient is on Flexeril 5 mg at night, she may stop this medication when she goes on the tizanidine.

## 2013-01-26 NOTE — Progress Notes (Signed)
Reason for visit: Left sided spasticity  [name] is a 62 y.o. female  History of present illness:  [name] is a 62 year old right-handed white female with a history of a stroke in 2009 affecting the right posterior cerebral artery distribution. The patient sustained a left-sided hemiparesis, and a left homonymous visual field deficit. The patient has had a gait disturbance since that time, and she has developed significant spasticity affecting her left arm and left leg. The patient indicates that her left arm is in flexion, and her fingers are also in flexion. The patient has difficulty with pronation of the left foot, and she uses a brace for this reason. The patient has a service dog to help her walk, and to help maintain balance. The patient has not had any falls. The patient indicates that at nighttime, the left arm and leg will go into spasms that are painful. The patient is taking Flexeril for this without significant benefit. The patient in the past has not been able to tolerate baclofen secondary to drowsiness. The patient also reports some problems with depression, and intermittent episodes of vertigo associated with nystagmus, nausea. The patient has had episodes of "ice pick pains", and some occasional headaches. The patient has issues with alternating diarrhea and constipation, with a history of Crohn's disease. The patient remains on low-dose aspirin. The patient comes to this office for an evaluation. The patient is mainly interested in treating the spasticity, and she is interested in Botox therapies.  Past Medical History  Diagnosis Date  . Diabetes mellitus   . GERD (gastroesophageal reflux disease)   . Hyperlipidemia   . Hypertension   . Stroke   . Thyroid disease   . Visual disturbance     Patient has vision loss.  . Constipation   . Bruises easily   . Lump in female breast   . Hemiparesis and alteration of sensations as late effects of stroke 01/26/2013  .  Abnormality of gait 01/26/2013  . Dizziness and giddiness 01/26/2013  . Crohn's disease   . Depression   . Panic disorder     Past Surgical History  Procedure Laterality Date  . Cesarean section  1981  . Cesarean section  1983  . Cholecystectomy  1986  . Small intestine surgery  05/1991  . Av fistula repair  2008  . Breast surgery      Family History  Problem Relation Age of Onset  . Heart disease Mother   . Heart disease Father   . Diabetes Brother   . Diabetes Brother   . Migraines Son     Social history:  reports that she quit smoking about 19 years ago. She has never used smokeless tobacco. She reports that she does not drink alcohol or use illicit drugs.  Medications:  Current Outpatient Prescriptions on File Prior to Visit  Medication Sig Dispense Refill  . aspirin 81 MG tablet Take 81 mg by mouth daily.      . cyclobenzaprine (FLEXERIL) 5 MG tablet Take 5 mg by mouth Daily.       Marland Kitchen JANUVIA 100 MG tablet Take 100 mg by mouth Daily.       Marland Kitchen levothyroxine (SYNTHROID, LEVOTHROID) 125 MCG tablet Take 125 mcg by mouth Daily.       . pantoprazole (PROTONIX) 40 MG tablet Take 40 mg by mouth daily.       . ramipril (ALTACE) 2.5 MG capsule Take 2.5 mg by mouth daily.       Marland Kitchen  simvastatin (ZOCOR) 10 MG tablet Take 10 mg by mouth Daily.        No current facility-administered medications on file prior to visit.      Allergies  Allergen Reactions  . Codeine Other (See Comments)    Syncope.  . Morphine And Related Nausea And Vomiting and Other (See Comments)    Hallucinations.  . Baclofen     Sleepy   . Sulfur Itching    Per patient happened years ago.    ROS:  Out of a complete 14 system review of symptoms, the patient complains only of the following symptoms, and all other reviewed systems are negative.  Vertigo Itching Diarrhea, constipation  Joint pain Allergies Memory loss, confusion, headache, numbness, weakness Depression, anxiety, suicidal  thoughts Restless legs  Blood pressure 126/77, pulse 96, height 5' 4.5" (1.638 m), weight 169 lb (76.658 kg).  Physical Exam  General: The patient is alert and cooperative at the time of the examination. Mild masking of the face is seen.  Head: Pupils are equal, round, and reactive to light. Discs are flat bilaterally.  Neck: The neck is supple, no carotid bruits are noted.  Respiratory: The respiratory examination is clear.  Cardiovascular: The cardiovascular examination reveals a regular rate and rhythm, no obvious murmurs or rubs are noted.  Skin: Extremities are without significant edema.  Neurologic Exam  Mental status:  Cranial nerves: Facial symmetry is present. There is good sensation of the face to pinprick and soft touch on the right, decreased on the left. The strength of the facial muscles and the muscles to head turning and shoulder shrug are normal bilaterally. Speech is well enunciated, no aphasia or dysarthria is noted. Extraocular movements are full. Visual fields are notable for a dense left homonymous visual field deficit.  Motor: The motor testing reveals 5 over 5 strength of the right extremities. Increased motor tone is noted on the left arm and left leg, left arm is in flexion. The patient has good grip strength, good flexion at the elbow, difficulty with extension of the left arm. 4/5 strength proximally in the left arm. The patient has increased tone on the left leg, pronation of the left foot, left toe is upgoing, unable to dorsiflex the left foot well. The patient has good extensor and flexor strength of the left leg, good proximal strength. The patient has difficulty with eversion and inversion of the left foot.  Sensory: Sensory testing is intact to pinprick, soft touch, vibration sensation, and position sense on the right side, decreased pinprick on the left arm and leg, vibration sensation is relatively symmetric, position sense is decreased on the left foot,  normal on the left hand. No evidence of extinction is noted.  Coordination: Cerebellar testing reveals good finger-nose-finger and heel-to-shin on the right, the patient has difficulty performing on the left side secondary to spasticity.  Gait and station: Gait is unsteady, the patient has a circumduction type gait with the left leg. Tandem gait was not attempted. Romberg is negative.  Reflexes: Deep tendon reflexes are brisk on the left arm and leg, normal the right.. Toes are downgoing on the right, upgoing on the left.   Assessment/Plan:  1. Cerebrovascular disease, right posterior cerebral artery distribution stroke  2. Left hemiparesis, spasticity  3. Gait disturbance  4. History of vertigo  5. Depression, anxiety, panic disorder  The patient should be a good candidate for Botox therapy on her leg and arm. The patient will be set up to see  Dr. Krista Blue for this purpose. The patient will be placed on tizanidine 4 mg at night, and in the future, medication such as Lexapro may be added for depression and anxiety. The patient was given a prescription for meclizine to take if needed for her intermittent vertigo. The patient will followup for evaluation for possible Botox. The patient may benefit from an occupational therapy evaluation in the future to maximize the use of her left hand.  Jill Alexanders MD 01/26/2013 9:04 PM  [facility] [address] [place], [place] [address]  Phone [phone] Fax [phone]

## 2013-01-26 NOTE — Telephone Encounter (Signed)
States Dr. Vonzella Nipple her a muscle relaxer for her and she wants to know if she should stop taking Flexeril. Please advise

## 2013-01-26 NOTE — Patient Instructions (Signed)

## 2013-01-28 ENCOUNTER — Ambulatory Visit: Payer: Medicare Other | Admitting: Neurology

## 2013-01-31 ENCOUNTER — Telehealth: Payer: Self-pay | Admitting: *Deleted

## 2013-01-31 NOTE — Telephone Encounter (Signed)
Apparently Dr. Hosie Poisson can do Botox for stroke, I have no problem with him doing this patient's Botox therapies.

## 2013-02-14 ENCOUNTER — Telehealth: Payer: Self-pay | Admitting: Neurology

## 2013-02-14 MED ORDER — TIZANIDINE HCL 2 MG PO TABS: ORAL_TABLET | ORAL | Status: DC

## 2013-02-14 NOTE — Telephone Encounter (Signed)
Spoke with patient and she said after reading medical profile of botox physicians, would prefer to schedule treatment with Dr Hosie Poisson instead of Dr Terrace Arabia.  She wanted to know if Dr Anne Hahn could  Prescribe Zanaflex for daytime as well, has noticed improvement in her tone but seems to wear off after about eight hours.

## 2013-02-15 NOTE — Telephone Encounter (Signed)
Patient is orginally sched with Dr Terrace Arabia, reschedule with Dr Hosie Poisson per Dr Anne Hahn.  She should already have paperwork

## 2013-02-15 NOTE — Telephone Encounter (Signed)
Change appt to botox with Dr Hosie Poisson

## 2013-02-15 NOTE — Telephone Encounter (Signed)
I don't have paperwork for this patient at all as far as botox.

## 2013-03-02 ENCOUNTER — Telehealth: Payer: Self-pay | Admitting: Neurology

## 2013-03-02 NOTE — Telephone Encounter (Signed)
TOOK AN EXTRA 2MG ZANAFLEX TODAY BY MISTAKE--FEELS VERY SLEEPY--PLEASE CALL

## 2013-03-02 NOTE — Telephone Encounter (Signed)
Spoke to patient and she was concerned that she took and extra 2mg  tab of Zanaflex today.  She feels sleepy.  I let her know I didn't think the extra tab was harmful but will forward to doctor for advice.

## 2013-03-02 NOTE — Telephone Encounter (Signed)
I called the patient. The patient is doing better now, took an extra 2 mg Zanaflex tablet. She was somewhat sleepy, the medication effect is now worn off.

## 2013-03-21 ENCOUNTER — Telehealth: Payer: Self-pay | Admitting: Neurology

## 2013-03-21 NOTE — Telephone Encounter (Signed)
I called patient. The patient had onset of true vertigo today, associated with some nausea. The patient forgot that she had meclizine, but she is feeling better anyway as the day goes on. The patient will contact me if the vertigo persists, and we will consider a referral for vestibular rehabilitation. The patient does have a headache, but she generally does get headaches if there are changes in the weather. The patient will followup if needed.

## 2013-03-21 NOTE — Telephone Encounter (Signed)
Patient called stating she has been dizzy for a few days and her muscle tone feels tight. She has mild pain on the right side of her head. Patient requesting a call back from a nurse.

## 2013-03-21 NOTE — Telephone Encounter (Signed)
R/T patient's call in regards to complaints of dizziness and head pain.  She woke up around 5 am and felt very dizzy.She says her bs was 195 and her mouth is very dry. I asked if she had been taking Meclizine. She says she totally forgot about it.  She says she is starting to feel a little better. I told her that I would still give Dr. Jannifer Franklin the message.

## 2013-04-01 ENCOUNTER — Ambulatory Visit: Payer: Medicare Other | Admitting: Neurology

## 2013-04-01 ENCOUNTER — Ambulatory Visit (INDEPENDENT_AMBULATORY_CARE_PROVIDER_SITE_OTHER): Payer: Medicare Other | Admitting: Neurology

## 2013-04-01 ENCOUNTER — Encounter: Payer: Self-pay | Admitting: Neurology

## 2013-04-01 VITALS — BP 132/82 | HR 95 | Ht 64.0 in | Wt 176.0 lb

## 2013-04-01 DIAGNOSIS — G8110 Spastic hemiplegia affecting unspecified side: Secondary | ICD-10-CM

## 2013-04-01 DIAGNOSIS — I6350 Cerebral infarction due to unspecified occlusion or stenosis of unspecified cerebral artery: Secondary | ICD-10-CM

## 2013-04-01 DIAGNOSIS — I639 Cerebral infarction, unspecified: Secondary | ICD-10-CM

## 2013-04-01 NOTE — Progress Notes (Signed)
Reason for visit: Left sided spasticity  [name] is a 63 y.o. female presenting today for initial evaluation for botulinum toxin therapy for spastic hemiplegia resulting from a stroke in 2009.  She notes a flexed posture of the left upper extremity, difficulty opening her fingers, pain in fingers and forearm area. Notes a inversion of the right ankle and flexion of the toes causing difficulty walking and pain. Her symptoms have made it difficult to function on a daily basis, she has had trips and falls, impaired hygiene of her hand and difficulty taking her shirt and clothing off. She uses an assistance dog for balance.  Past Medical History  Diagnosis Date  . Diabetes mellitus   . GERD (gastroesophageal reflux disease)   . Hyperlipidemia   . Hypertension   . Stroke   . Thyroid disease   . Visual disturbance     Patient has vision loss.  . Constipation   . Bruises easily   . Lump in female breast   . Hemiparesis and alteration of sensations as late effects of stroke 01/26/2013  . Abnormality of gait 01/26/2013  . Dizziness and giddiness 01/26/2013  . Crohn's disease   . Depression   . Panic disorder     Past Surgical History  Procedure Laterality Date  . Cesarean section  1981  . Cesarean section  1983  . Cholecystectomy  1986  . Small intestine surgery  05/1991  . Av fistula repair  2008  . Breast surgery      Family History  Problem Relation Age of Onset  . Heart disease Mother   . Heart disease Father   . Diabetes Brother   . Diabetes Brother   . Migraines Son     Social history:  reports that she quit smoking about 19 years ago. She has never used smokeless tobacco. She reports that she does not drink alcohol or use illicit drugs.  Medications:  Current Outpatient Prescriptions on File Prior to Visit  Medication Sig Dispense Refill  . aspirin 81 MG tablet Take 81 mg by mouth daily.      Marland Kitchen JANUVIA 100 MG tablet Take 100 mg by mouth Daily.       Marland Kitchen  levothyroxine (SYNTHROID, LEVOTHROID) 125 MCG tablet Take 125 mcg by mouth Daily.       . meclizine (ANTIVERT) 25 MG tablet Take 1 tablet (25 mg total) by mouth 3 (three) times daily as needed for dizziness.  30 tablet  3  . MICRONIZED COLESTIPOL HCL 1 G tablet Take 1 g by mouth daily.      . pantoprazole (PROTONIX) 40 MG tablet Take 40 mg by mouth daily.       . ramipril (ALTACE) 2.5 MG capsule Take 2.5 mg by mouth daily.       . repaglinide (PRANDIN) 0.5 MG tablet Take 0.5 mg by mouth daily.      . simvastatin (ZOCOR) 10 MG tablet Take 10 mg by mouth Daily.       Marland Kitchen tiZANidine (ZANAFLEX) 2 MG tablet 1 tablet twice daily, 2 tablets at night  120 tablet  5   No current facility-administered medications on file prior to visit.      Allergies  Allergen Reactions  . Codeine Other (See Comments)    Syncope.  . Morphine And Related Nausea And Vomiting and Other (See Comments)    Hallucinations.  . Baclofen     Sleepy   . Sulfur Itching  Per patient happened years ago.    ROS:  Out of a complete 14 system review of symptoms, the patient complains only of the following symptoms, and all other reviewed systems are negative.  Denies any positive root systems at this time   Blood pressure 132/82, pulse 95, height 5\' 4"  (1.626 m), weight 176 lb (79.833 kg).  Physical Exam  General: The patient is alert and cooperative at the time of the examination. Mild masking of the face is seen.  Head: Pupils are equal, round, and reactive to light. Discs are flat bilaterally.  Neck: The neck is supple, no carotid bruits are noted.  Respiratory: The respiratory examination is clear.  Cardiovascular: The cardiovascular examination reveals a regular rate and rhythm, no obvious murmurs or rubs are noted.  Skin: Extremities are without significant edema.  Neurologic Exam  Mental status:  Cranial nerves: Facial symmetry is present. There is good sensation of the face to pinprick and soft  touch on the right, decreased on the left. The strength of the facial muscles and the muscles to head turning and shoulder shrug are normal bilaterally. Speech is well enunciated, no aphasia or dysarthria is noted. Extraocular movements are full. Visual fields are notable for a dense left homonymous visual field deficit.  Motor: The motor testing reveals 5 over 5 strength of the right extremities. Increased motor tone is noted on the left arm and left leg, left arm is in flexion. With effort able to fully extend all fingers. The patient has good grip strength, good flexion at the elbow, difficulty with extension of the left arm. 4/5 strength proximally in the left arm. The patient has increased tone on the left leg, pronation of the left foot, left toe is upgoing, unable to dorsiflex the left foot well. The patient has good extensor and flexor strength of the left leg, good proximal strength. The patient has difficulty with eversion and inversion of the left foot.  Sensory: Sensory testing is intact to pinprick, soft touch, vibration sensation, and position sense on the right side, decreased pinprick on the left arm and leg, vibration sensation is relatively symmetric, position sense is decreased on the left foot, normal on the left hand. No evidence of extinction is noted.  Coordination: Cerebellar testing reveals good finger-nose-finger and heel-to-shin on the right, the patient has difficulty performing on the left side secondary to spasticity.  Gait and station: Gait is unsteady, the patient has a circumduction type gait with the left leg. Tandem gait was not attempted. Romberg is negative.  Reflexes: Deep tendon reflexes are brisk on the left arm and leg, normal the right.. Toes are downgoing on the right, upgoing on the left.   Assessment/Plan:  1. Cerebrovascular disease, right posterior cerebral artery distribution stroke  2. Left hemiparesis, spasticity  3. Gait disturbance  Patient is  in today for initial evaluation of Botox therapy for spastic hemiplegia resulting from a stroke in 2009. Patient's exam is notable for increased tone on the left side, with flex posture of the left upper extremity and extended posture the left lower extremity. The symptoms are interfering with patient's quality of life and causing trouble walking. She appears to be an excellent candidate for Botox therapy. Discussed benefits and risks with patient, she expressed understanding. Will place referral with entrance, once approval granted will schedule patient for initial visit.  Jim Like, DO   [facility] [address] [place], [place] [address]  Phone [phone] Fax [phone]

## 2013-04-01 NOTE — Patient Instructions (Signed)
Overall you are doing fairly well but I do want to suggest a few things today:   You may benefit from Botox injections to help treat your spasticity. We will place a request with your insurance company and notify you once we get approval.   After you do the injections you will likely benefit from doing a course of physical therapy. We can discuss this further in the future.   My clinical assistant and will answer any of your questions and relay your messages to me and also relay most of my messages to you.   Our phone number is [phone]. We also have an after hours call service for urgent matters and there is a physician on-call for urgent questions. For any emergencies you know to call 911 or go to the nearest emergency room

## 2013-05-13 ENCOUNTER — Ambulatory Visit: Payer: Self-pay | Admitting: Gastroenterology

## 2013-05-13 ENCOUNTER — Ambulatory Visit: Payer: Medicare Other | Admitting: Neurology

## 2013-05-20 ENCOUNTER — Ambulatory Visit (INDEPENDENT_AMBULATORY_CARE_PROVIDER_SITE_OTHER): Payer: Medicare Other | Admitting: Neurology

## 2013-05-20 ENCOUNTER — Encounter: Payer: Self-pay | Admitting: Neurology

## 2013-05-20 VITALS — BP 134/86 | HR 96

## 2013-05-20 DIAGNOSIS — G8110 Spastic hemiplegia affecting unspecified side: Secondary | ICD-10-CM

## 2013-05-20 MED ORDER — ONABOTULINUMTOXINA 100 UNITS IJ SOLR: 200.0000 [IU] | Freq: Once | INTRAMUSCULAR | Status: DC

## 2013-05-20 NOTE — Progress Notes (Signed)
[facility]   Provider:  Dr Janann Colonel Referring Provider: [name], MD Primary Care Physician:  [name], MD  CC:  Spastic hemiplegia  HPI:  [name] is a 63 y.o. female here for initial botox injections for spastic hemiplegia.   Presents today for initial Botox injections. Notes main concerns is increased flexion of her fingers, difficulty abducting her shoulder and inversion of her left ankle when walking. Continues to do stretching exercises at home. Has service dog who helps with her balance.   PE:  Flexion posture of LUE, pronated forearm. Inversion L ankle L pec major: 30 L biceps 25 L pronator teres 20 L FDP 15 L FDS 10  L posterior tib: 30  Total 130  Contraindications and precautions discussed with patient. EMG guidance was used to inject muscles detailed below. Aseptic procedure was observed and patient tolerated procedure. Procedure performed by Dr. Jim Like.  The condition has existed for more than 6 months, and pt does not have a diagnosis of ALS, Myasthenia Gravis or Lambert-Eaton Syndrome.  Risks and benefits of injections discussed and pt agrees to proceed with the procedure.  Written consent obtained These injections are medically necessary. He receives good benefits from these injections. These injections do not cause sedations or hallucinations which the oral therapies may cause.  Indication/Diagnosis:spastic hemiplegia  Type of toxin:Botox   Lot #[id number]  Expiration date: March 2016    History   Social History  . Marital Status: Married    Spouse Name: [name]    Number of Children: 2  . 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 Smoking status: Former Smoker    Quit date: 04/16/1993  . Smokeless tobacco: Never Used  . Alcohol Use: No  . Drug Use: No  . Sexual Activity: Not on file   Other Topics Concern  . Not on file   Social  History Narrative   Patient is married [name]), has 2 children   Patient is right handed   Education level is Doctorate   Caffeine consumption is rarely, drinks decaf    Family History  Problem Relation Age of Onset  . Heart disease Mother   . Heart disease Father   . Diabetes Brother   . Diabetes Brother   . Migraines Son     Past Medical History  Diagnosis Date  . Diabetes mellitus   . GERD (gastroesophageal reflux disease)   . Hyperlipidemia   . Hypertension   . Stroke   . Thyroid disease   . Visual disturbance     Patient has vision loss.  . Constipation   . Bruises easily   . Lump in female breast   . Hemiparesis and alteration of sensations as late effects of stroke 01/26/2013  . Abnormality of gait 01/26/2013  . Dizziness and giddiness 01/26/2013  . Crohn's disease   . Depression   . Panic disorder     Past Surgical History  Procedure Laterality Date  . Cesarean section  1981  . Cesarean section  1983  . Cholecystectomy  1986  . Small intestine surgery  05/1991  . Av fistula repair  2008  . Breast surgery      Current Outpatient Prescriptions  Medication Sig Dispense Refill  . aspirin 81 MG tablet Take 81 mg by mouth daily.      Marland Kitchen JANUVIA 100 MG tablet Take 100 mg by mouth Daily.       Marland Kitchen  levothyroxine (SYNTHROID, LEVOTHROID) 125 MCG tablet Take 125 mcg by mouth Daily.       . meclizine (ANTIVERT) 25 MG tablet Take 1 tablet (25 mg total) by mouth 3 (three) times daily as needed for dizziness.  30 tablet  3  . MICRONIZED COLESTIPOL HCL 1 G tablet Take 1 g by mouth daily.      . pantoprazole (PROTONIX) 40 MG tablet Take 40 mg by mouth daily.       . ramipril (ALTACE) 2.5 MG capsule Take 2.5 mg by mouth daily.       . repaglinide (PRANDIN) 0.5 MG tablet Take 0.5 mg by mouth daily.      . simvastatin (ZOCOR) 10 MG tablet Take 10 mg by mouth Daily.       Marland Kitchen tiZANidine (ZANAFLEX) 2 MG tablet 1 tablet twice daily, 2 tablets at night  120 tablet  5   No current  facility-administered medications for this visit.    Allergies as of 05/20/2013 - Review Complete 05/20/2013  Allergen Reaction Noted  . Codeine Other (See Comments) 04/17/2011  . Morphine and related Nausea And Vomiting and Other (See Comments) 04/17/2011  . Baclofen Nausea Only and Other (See Comments) 01/26/2013  . Sulfur Itching 04/17/2011    Vitals: BP 134/86  Pulse 96 Last Weight:  Wt Readings from Last 1 Encounters:  04/01/13 176 lb (79.833 kg)   Last Height:   Ht Readings from Last 1 Encounters:  04/01/13 5\' 4"  (1.626 m)      Assessment:  After physical and neurologic examination, review of laboratory studies, imaging, neurophysiology testing and pre-existing records, assessment will be reviewed on the problem list.  Plan:  Treatment plan and additional workup will be reviewed under Problem List.  [name] is a 64 y.o. female here for botulinum toxin injections. They tolerated procedure well with no complications.   1) Botox injections as detailed above. A total of 130 units was used, 70 units wasted. 2) Tylenol or Motrin for injection site pain. 3) Medication guide dispensed. 4) Follow up for repeat injections in 3 months   Jim Like, DO  [facility] [address] [place], [place] [address]  Phone [phone] Fax [phone]

## 2013-05-26 ENCOUNTER — Telehealth: Payer: Self-pay | Admitting: Neurology

## 2013-05-26 NOTE — Telephone Encounter (Signed)
Patient questioning when occupational therapy is to start? She has not heard back from anyone and would like to start treatment. Please call to advise.

## 2013-05-26 NOTE — Telephone Encounter (Signed)
Called pt and pt wanted to know about her occupational therapy, pt stated that she has not heard from anyone. I informed the pt that Dr. Janann Colonel was out of the office and would not be in until Monday. I informed pt that per OV note on 04/01/13 that occupational therapy would be set up after the botox injections. Pt stated that it was supposed to be set up while she was getting the injections. Pt stated that she would wait until Dr. Janann Colonel comes back in the office. Please advise

## 2013-05-30 ENCOUNTER — Other Ambulatory Visit: Payer: Self-pay | Admitting: Neurology

## 2013-05-30 DIAGNOSIS — G8110 Spastic hemiplegia affecting unspecified side: Secondary | ICD-10-CM

## 2013-05-30 NOTE — Telephone Encounter (Signed)
Please let her know the referral is in and she should be hearing from someone shortly. Thanks.

## 2013-05-30 NOTE — Telephone Encounter (Signed)
Called pt to inform her per Dr. Janann Colonel that her referral had been put in and that someone would be getting in contact with the pt shortly and if she has any other problems, questions or concerns to call the office. Pt verbalized understanding.

## 2013-06-08 ENCOUNTER — Other Ambulatory Visit: Payer: Self-pay | Admitting: Neurology

## 2013-06-08 DIAGNOSIS — G8110 Spastic hemiplegia affecting unspecified side: Secondary | ICD-10-CM

## 2013-06-17 ENCOUNTER — Other Ambulatory Visit: Payer: Self-pay | Admitting: Neurology

## 2013-06-17 DIAGNOSIS — G8110 Spastic hemiplegia affecting unspecified side: Secondary | ICD-10-CM

## 2013-06-17 NOTE — Telephone Encounter (Signed)
Please let her know I sent a request in for PT. Thanks.

## 2013-06-17 NOTE — Telephone Encounter (Signed)
Pt called states she can't do OT in [place] due to transportation, pt would like this to be at [facility]. Pt would like for Dr. Janann Colonel or his nurse to return her call concerning this matter. Thanks

## 2013-06-17 NOTE — Telephone Encounter (Signed)
Spoke with patient, referral was sent to [facility], patient states that she would also like to be seen for physical therapy. Dr. Janann Colonel please advise.

## 2013-07-20 ENCOUNTER — Encounter: Payer: Self-pay | Admitting: Neurology

## 2013-07-25 ENCOUNTER — Telehealth: Payer: Self-pay | Admitting: Neurology

## 2013-07-25 NOTE — Telephone Encounter (Signed)
Patient calling to state that she believes she may have had hallucinations today. Patient took her medication at 1 PM and then fell asleep while watching TV. Patient describes that when she woke up and looked down, she saw a lizard crawling o her sofa. Patient states that she doesn't have any lizards in her house and states she believes she may have just had a hallucination. Please call patient and advise.

## 2013-07-26 NOTE — Telephone Encounter (Signed)
I called the patient, left a message, I will call back later. 

## 2013-07-26 NOTE — Telephone Encounter (Signed)
I called patient. The patient has had an event where she had a hallucination of the lizard after taking a nap. This is from the tizanidine. She is also having some vivid dreams. These events are not too concerning for her, she may continue the medication. At times, she may get sleepy from the a.m. dose of the tizanidine, she is to take medication with food, and consider breaking the tablet in half on the first dose taking only 1 mg to 2 mg.

## 2013-07-26 NOTE — Telephone Encounter (Signed)
Spoke with patient and she said that the zanaflex sometimes works fine but sometimes she can't stay awake.  She started hallucinating -seeing movements out of the corner of her eyes, seeing things that were not there. She saw on the bottle that you are to take the medicine with a full glass of water which she did not do otherwise was taken as directed. Patient said that  Once in the past  she had taken morphine and 1 hour after taken, had the same feeling.

## 2013-08-12 ENCOUNTER — Ambulatory Visit (INDEPENDENT_AMBULATORY_CARE_PROVIDER_SITE_OTHER): Payer: Medicare Other | Admitting: Neurology

## 2013-08-12 ENCOUNTER — Encounter: Payer: Self-pay | Admitting: Neurology

## 2013-08-12 VITALS — BP 138/77 | HR 84 | Ht 64.0 in | Wt 176.0 lb

## 2013-08-12 DIAGNOSIS — H8110 Benign paroxysmal vertigo, unspecified ear: Secondary | ICD-10-CM

## 2013-08-12 DIAGNOSIS — G8110 Spastic hemiplegia affecting unspecified side: Secondary | ICD-10-CM

## 2013-08-12 MED ORDER — ONABOTULINUMTOXINA 100 UNITS IJ SOLR: 200.0000 [IU] | Freq: Once | INTRAMUSCULAR | Status: DC

## 2013-08-12 NOTE — Progress Notes (Signed)
[facility]   Provider:  Dr Janann Colonel Referring Provider: [name], MD Primary Care Physician:  [name], MD  CC:  Spastic hemiplegia  HPI:  [name] is a 63 y.o. female here for follow up botox injections for spastic hemiplegia.   Presents today for follow up Botox injections. Recently started OT, has gone 4 times so far. Notes good benefit from the injections. Tolerated well.  Notes main concerns is increased flexion of her fingers, difficulty abducting her shoulder and inversion of her left ankle when walking. Continues to do stretching exercises at home. Has service dog who helps with her balance.   Currently taking Zanaflex 2mg  tablet, (1/2 tab at 7am, 1/2 tab at 1pm and 2 tabs at 7pm) PE:  Flexion posture of LUE, pronated forearm. Inversion L ankle L pec major: 35 L biceps 40 L pronator teres 20 L FDP 20 L FDS 15  L posterior tib: 40  Total 170  Contraindications and precautions discussed with patient. EMG guidance was used to inject muscles detailed below. Aseptic procedure was observed and patient tolerated procedure. Procedure performed by Dr. Jim [name].  The condition has existed for more than 6 months, and pt does not have a diagnosis of ALS, Myasthenia Gravis or Lambert-Eaton Syndrome.  Risks and benefits of injections discussed and pt agrees to proceed with the procedure.  Written consent obtained These injections are medically necessary. He receives good benefits from these injections. These injections do not cause sedations or hallucinations which the oral therapies may cause.  Indication/Diagnosis:spastic hemiplegia  Type of toxin:Botox   Lot #[id number]  Expiration date: March 2016    History   Social History  . Marital Status: Married    Spouse Name: [name]    Number of Children: 2  . 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 Smoking status:  Former Smoker    Quit date: 04/16/1993  . Smokeless tobacco: Never Used  . Alcohol Use: No  . Drug Use: No  . Sexual Activity: Not on file   Other Topics Concern  . Not on file   Social History Narrative   Patient is married [name]), has 2 children   Patient is right handed   Education level is Doctorate   Caffeine consumption is rarely, drinks decaf    Family History  Problem Relation Age of Onset  . Heart disease Mother   . Heart disease Father   . Diabetes Brother   . Diabetes Brother   . Migraines Son     Past Medical History  Diagnosis Date  . Diabetes mellitus   . GERD (gastroesophageal reflux disease)   . Hyperlipidemia   . Hypertension   . Stroke   . Thyroid disease   . Visual disturbance     Patient has vision loss.  . Constipation   . Bruises easily   . Lump in female breast   . Hemiparesis and alteration of sensations as late effects of stroke 01/26/2013  . Abnormality of gait 01/26/2013  . Dizziness and giddiness 01/26/2013  . Crohn's disease   . Depression   . Panic disorder     Past Surgical History  Procedure Laterality Date  . Cesarean section  1981  . Cesarean section  1983  . Cholecystectomy  1986  . Small intestine surgery  05/1991  . Av fistula repair  2008  . Breast surgery  Current Outpatient Prescriptions  Medication Sig Dispense Refill  . aspirin 81 MG tablet Take 81 mg by mouth daily.      Marland Kitchen JANUVIA 100 MG tablet Take 100 mg by mouth Daily.       Marland Kitchen levothyroxine (SYNTHROID, LEVOTHROID) 125 MCG tablet Take 125 mcg by mouth Daily.       . meclizine (ANTIVERT) 25 MG tablet Take 1 tablet (25 mg total) by mouth 3 (three) times daily as needed for dizziness.  30 tablet  3  . MICRONIZED COLESTIPOL HCL 1 G tablet Take 1 g by mouth daily.      . pantoprazole (PROTONIX) 40 MG tablet Take 40 mg by mouth daily.       . ramipril (ALTACE) 2.5 MG capsule Take 2.5 mg by mouth daily.       . repaglinide (PRANDIN) 0.5 MG tablet Take 0.5 mg by  mouth daily.      . simvastatin (ZOCOR) 10 MG tablet Take 10 mg by mouth Daily.       Marland Kitchen tiZANidine (ZANAFLEX) 2 MG tablet 1 tablet twice daily, 2 tablets at night  120 tablet  5   Current Facility-Administered Medications  Medication Dose Route Frequency Provider Last Rate Last Dose  . botulinum toxin Type A (BOTOX) injection 200 Units  200 Units Intramuscular Once Hulen Luster, DO        Allergies as of 08/12/2013 - Review Complete 08/12/2013  Allergen Reaction Noted  . Codeine Other (See Comments) 04/17/2011  . Morphine and related Nausea And Vomiting and Other (See Comments) 04/17/2011  . Baclofen Nausea Only and Other (See Comments) 01/26/2013  . Sulfur Itching 04/17/2011    Vitals: BP 138/77  Pulse 84  Ht 5\' 4"  (1.626 m)  Wt 176 lb (79.833 kg)  BMI 30.20 kg/m2 Last Weight:  Wt Readings from Last 1 Encounters:  08/12/13 176 lb (79.833 kg)   Last Height:   Ht Readings from Last 1 Encounters:  08/12/13 5\' 4"  (1.626 m)      Assessment:  After physical and neurologic examination, review of laboratory studies, imaging, neurophysiology testing and pre-existing records, assessment will be reviewed on the problem list.  Plan:  Treatment plan and additional workup will be reviewed under Problem List.  [name] is a 63 y.o. female here for botulinum toxin injections. They tolerated procedure well with no complications.   1) Botox injections as detailed above. A total of 170 units was used, 30 units wasted. 2) Tylenol or Motrin for injection site pain. 3) Medication guide dispensed. 4) Follow up for repeat injections in 3 months 5)Referral placed for vestibular rehab as patient notes intermittent episodes of vertigo   Danielle Like, DO  [facility] [address] [place], [place] [address]  Phone [phone] Fax [phone]

## 2013-08-19 ENCOUNTER — Ambulatory Visit: Payer: Medicare Other | Admitting: Neurology

## 2013-08-22 ENCOUNTER — Other Ambulatory Visit: Payer: Self-pay | Admitting: Neurology

## 2013-08-22 NOTE — Telephone Encounter (Signed)
Last Procedure visit says: Patient takes Tizanidine 1/2 tab at 7am, 1/2 tab at 1pm and 2 tabs at 7pm

## 2013-09-06 ENCOUNTER — Inpatient Hospital Stay (HOSPITAL_COMMUNITY)
Admission: EM | Admit: 2013-09-06 | Discharge: 2013-09-10 | DRG: 389 | Disposition: A | Discharge status: Home / Self Care | Payer: Medicare Other | Attending: Family Medicine | Admitting: Family Medicine

## 2013-09-06 ENCOUNTER — Encounter (HOSPITAL_COMMUNITY): Payer: Self-pay | Admitting: Emergency Medicine

## 2013-09-06 ENCOUNTER — Emergency Department (HOSPITAL_COMMUNITY): Payer: Medicare Other

## 2013-09-06 DIAGNOSIS — R42 Dizziness and giddiness: Secondary | ICD-10-CM

## 2013-09-06 DIAGNOSIS — K219 Gastro-esophageal reflux disease without esophagitis: Secondary | ICD-10-CM | POA: Diagnosis present

## 2013-09-06 DIAGNOSIS — K5000 Crohn's disease of small intestine without complications: Secondary | ICD-10-CM | POA: Diagnosis present

## 2013-09-06 DIAGNOSIS — E039 Hypothyroidism, unspecified: Secondary | ICD-10-CM | POA: Diagnosis present

## 2013-09-06 DIAGNOSIS — H547 Unspecified visual loss: Secondary | ICD-10-CM | POA: Diagnosis present

## 2013-09-06 DIAGNOSIS — R269 Unspecified abnormalities of gait and mobility: Secondary | ICD-10-CM

## 2013-09-06 DIAGNOSIS — R1030 Lower abdominal pain, unspecified: Secondary | ICD-10-CM

## 2013-09-06 DIAGNOSIS — I69359 Hemiplegia and hemiparesis following cerebral infarction affecting unspecified side: Secondary | ICD-10-CM

## 2013-09-06 DIAGNOSIS — E860 Dehydration: Secondary | ICD-10-CM | POA: Diagnosis present

## 2013-09-06 DIAGNOSIS — R109 Unspecified abdominal pain: Secondary | ICD-10-CM | POA: Diagnosis not present

## 2013-09-06 DIAGNOSIS — Z8673 Personal history of transient ischemic attack (TIA), and cerebral infarction without residual deficits: Secondary | ICD-10-CM

## 2013-09-06 DIAGNOSIS — K50112 Crohn's disease of large intestine with intestinal obstruction: Secondary | ICD-10-CM

## 2013-09-06 DIAGNOSIS — E119 Type 2 diabetes mellitus without complications: Secondary | ICD-10-CM | POA: Diagnosis present

## 2013-09-06 DIAGNOSIS — K56609 Unspecified intestinal obstruction, unspecified as to partial versus complete obstruction: Principal | ICD-10-CM | POA: Diagnosis present

## 2013-09-06 DIAGNOSIS — K5900 Constipation, unspecified: Secondary | ICD-10-CM | POA: Diagnosis present

## 2013-09-06 DIAGNOSIS — Z7982 Long term (current) use of aspirin: Secondary | ICD-10-CM

## 2013-09-06 DIAGNOSIS — I10 Essential (primary) hypertension: Secondary | ICD-10-CM | POA: Diagnosis present

## 2013-09-06 DIAGNOSIS — Z87891 Personal history of nicotine dependence: Secondary | ICD-10-CM

## 2013-09-06 DIAGNOSIS — I69398 Other sequelae of cerebral infarction: Secondary | ICD-10-CM

## 2013-09-06 DIAGNOSIS — K5010 Crohn's disease of large intestine without complications: Secondary | ICD-10-CM | POA: Diagnosis present

## 2013-09-06 DIAGNOSIS — E785 Hyperlipidemia, unspecified: Secondary | ICD-10-CM | POA: Diagnosis present

## 2013-09-06 LAB — COMPREHENSIVE METABOLIC PANEL
ALT: 29 U/L (ref 0–35)
ANION GAP: 16 — AB (ref 5–15)
AST: 40 U/L — ABNORMAL HIGH (ref 0–37)
Albumin: 4.3 g/dL (ref 3.5–5.2)
Alkaline Phosphatase: 57 U/L (ref 39–117)
BUN: 12 mg/dL (ref 6–23)
CALCIUM: 9.3 mg/dL (ref 8.4–10.5)
CO2: 22 meq/L (ref 19–32)
CREATININE: 0.56 mg/dL (ref 0.50–1.10)
Chloride: 98 mEq/L (ref 96–112)
Glucose, Bld: 176 mg/dL — ABNORMAL HIGH (ref 70–99)
Potassium: 4.4 mEq/L (ref 3.7–5.3)
SODIUM: 136 meq/L — AB (ref 137–147)
TOTAL PROTEIN: 7.7 g/dL (ref 6.0–8.3)
Total Bilirubin: 0.5 mg/dL (ref 0.3–1.2)

## 2013-09-06 LAB — CBC WITH DIFFERENTIAL/PLATELET
Basophils Absolute: 0 10*3/uL (ref 0.0–0.1)
Basophils Relative: 0 % (ref 0–1)
EOS ABS: 0.1 10*3/uL (ref 0.0–0.7)
EOS PCT: 1 % (ref 0–5)
HEMATOCRIT: 39.7 % (ref 36.0–46.0)
Hemoglobin: 12.5 g/dL (ref 12.0–15.0)
Lymphocytes Relative: 15 % (ref 12–46)
Lymphs Abs: 1.3 10*3/uL (ref 0.7–4.0)
MCH: 25.5 pg — AB (ref 26.0–34.0)
MCHC: 31.5 g/dL (ref 30.0–36.0)
MCV: 80.9 fL (ref 78.0–100.0)
Monocytes Absolute: 0.4 10*3/uL (ref 0.1–1.0)
Monocytes Relative: 5 % (ref 3–12)
Neutro Abs: 7 10*3/uL (ref 1.7–7.7)
Neutrophils Relative %: 79 % — ABNORMAL HIGH (ref 43–77)
PLATELETS: 198 10*3/uL (ref 150–400)
RBC: 4.91 MIL/uL (ref 3.87–5.11)
RDW: 15.3 % (ref 11.5–15.5)
WBC: 8.9 10*3/uL (ref 4.0–10.5)

## 2013-09-06 LAB — URINALYSIS, ROUTINE W REFLEX MICROSCOPIC
Bilirubin Urine: NEGATIVE
GLUCOSE, UA: NEGATIVE mg/dL
Hgb urine dipstick: NEGATIVE
Leukocytes, UA: NEGATIVE
Nitrite: NEGATIVE
PROTEIN: NEGATIVE mg/dL
Specific Gravity, Urine: 1.022 (ref 1.005–1.030)
UROBILINOGEN UA: 1 mg/dL (ref 0.0–1.0)
pH: 6 (ref 5.0–8.0)

## 2013-09-06 LAB — CBG MONITORING, ED: Glucose-Capillary: 145 mg/dL — ABNORMAL HIGH (ref 70–99)

## 2013-09-06 LAB — LIPASE, BLOOD: LIPASE: 45 U/L (ref 11–59)

## 2013-09-06 MED ORDER — PREDNISONE 20 MG PO TABS: 40.0000 mg | ORAL_TABLET | Freq: Once | ORAL | Status: DC

## 2013-09-06 MED ORDER — SODIUM CHLORIDE 0.9 % IV BOLUS (SEPSIS)
1000.0000 mL | Freq: Once | INTRAVENOUS | Status: AC
  Administered 2013-09-06: 1000 mL via INTRAVENOUS

## 2013-09-06 MED ORDER — ONDANSETRON HCL 4 MG/2ML IJ SOLN
4.0000 mg | Freq: Once | INTRAMUSCULAR | Status: AC
  Administered 2013-09-06: 4 mg via INTRAVENOUS
  Filled 2013-09-06: qty 2

## 2013-09-06 MED ORDER — IOHEXOL 300 MG/ML  SOLN
80.0000 mL | Freq: Once | INTRAMUSCULAR | Status: AC | PRN
  Administered 2013-09-06: 80 mL via INTRAVENOUS

## 2013-09-06 MED ORDER — ONDANSETRON 4 MG PO TBDP: 4.0000 mg | ORAL_TABLET | Freq: Once | ORAL | Status: DC

## 2013-09-06 NOTE — ED Notes (Signed)
Pt vomiting at this time 

## 2013-09-06 NOTE — ED Notes (Signed)
Presents with upper abdominal pain with radiation to back began this AM, Sent ove rby Dr, Earle Gell [facility] with concerns for a bowel obstruction. Abdomen distended, tender. Pain described as spasms. Hyperactive bowel sounds on right-bowels sounds auscultated in all fields.  Reports nausea, denies vomiting, denies diarrhea. Reports constipation. Small hard BM this AM.  HX of Crohn's and colon resection.

## 2013-09-06 NOTE — Discharge Instructions (Signed)

## 2013-09-06 NOTE — ED Provider Notes (Signed)
CSN: [id number]     Arrival date & time 09/06/13  1659 History   First MD Initiated Contact with Patient 09/06/13 1752     Chief Complaint  Patient presents with  . Abdominal Pain     (Consider location/radiation/quality/duration/timing/severity/associated sxs/prior Treatment) HPI 63 y/o female with PMH of Chrones who presented with acute onset of diffuse abdominal pain that radiates to the back, worse with movement and food with associated nausea/vomiting. Patient has not tried any pain medication at home and is refusing pain medication in the department. She has had a small BM that is non-bloody.   Past Medical History  Diagnosis Date  . Diabetes mellitus   . GERD (gastroesophageal reflux disease)   . Hyperlipidemia   . Hypertension   . Stroke   . Thyroid disease   . Visual disturbance     Patient has vision loss.  . Constipation   . Bruises easily   . Lump in female breast   . Hemiparesis and alteration of sensations as late effects of stroke 01/26/2013  . Abnormality of gait 01/26/2013  . Dizziness and giddiness 01/26/2013  . Crohn's disease   . Depression   . Panic disorder    Past Surgical History  Procedure Laterality Date  . Cesarean section  1981  . Cesarean section  1983  . Cholecystectomy  1986  . Small intestine surgery  05/1991  . Av fistula repair  2008  . Breast surgery     Family History  Problem Relation Age of Onset  . Heart disease Mother   . Heart disease Father   . Diabetes Brother   . Diabetes Brother   . Migraines Son    History  Substance Use Topics  . Smoking status: Former Smoker    Quit date: 04/16/1993  . Smokeless tobacco: Never Used  . Alcohol Use: No   OB History   Grav Para Term Preterm Abortions TAB SAB Ect Mult Living                 Review of Systems  Constitutional: Negative for activity change.  HENT: Negative for congestion.   Respiratory: Negative for cough and shortness of breath.   Cardiovascular: Negative for chest  pain and leg swelling.  Gastrointestinal: Positive for nausea, vomiting and abdominal pain. Negative for diarrhea, constipation, blood in stool and abdominal distention.  Genitourinary: Negative for dysuria, flank pain and vaginal discharge.  Musculoskeletal: Negative for back pain.  Skin: Negative for color change.  Neurological: Negative for syncope and headaches.  Psychiatric/Behavioral: Negative for agitation.      Allergies  Codeine; Morphine and related; Baclofen; and Sulfur  Home Medications   Prior to Admission medications   Medication Sig Start Date End Date Taking? Authorizing Provider  aspirin 81 MG tablet Take 81 mg by mouth daily.   Yes Historical Provider, MD  JANUVIA 100 MG tablet Take 100 mg by mouth Daily.  04/13/11  Yes Historical Provider, MD  levothyroxine (SYNTHROID, LEVOTHROID) 125 MCG tablet Take 125 mcg by mouth Daily.  04/01/11  Yes Historical Provider, MD  MICRONIZED COLESTIPOL HCL 1 G tablet Take 1 g by mouth daily. 01/06/13  Yes Historical Provider, MD  pantoprazole (PROTONIX) 40 MG tablet Take 40 mg by mouth daily.  04/01/11  Yes Historical Provider, MD  ramipril (ALTACE) 2.5 MG capsule Take 2.5 mg by mouth daily.  04/01/11  Yes Historical Provider, MD  repaglinide (PRANDIN) 0.5 MG tablet Take 0.5 mg by mouth 2 (two) times daily  before a meal.  01/11/13  Yes Historical Provider, MD  simvastatin (ZOCOR) 10 MG tablet Take 10 mg by mouth Daily.  04/01/11  Yes Historical Provider, MD  tiZANidine (ZANAFLEX) 2 MG tablet Take 2-4 mg by mouth every 6 (six) hours as needed for muscle spasms. Take 1mg  every morning, 1mg  at noon, and 4mg  at bedtime.   Yes Historical Provider, MD   BP 154/81  Pulse 85  Temp(Src) 98 F (36.7 C) (Oral)  Resp 18  Ht 5\' 4"  (1.626 m)  Wt 165 lb (74.844 kg)  BMI 28.31 kg/m2  SpO2 97% Physical Exam  Constitutional: She is oriented to person, place, and time. She appears well-developed.  HENT:  Head: Normocephalic.  Eyes: Pupils are equal,  round, and reactive to light.  Neck: Neck supple.  Cardiovascular: Normal rate.  Exam reveals no gallop and no friction rub.   No murmur heard. Pulmonary/Chest: Effort normal and breath sounds normal. No respiratory distress.  Abdominal: Soft. She exhibits no distension. There is generalized tenderness. There is no rigidity, no rebound, no guarding, no CVA tenderness, no tenderness at McBurney's point and negative Murphy's sign.  Musculoskeletal: She exhibits no edema.  No tenderness to palpation of back    Neurological: She is alert and oriented to person, place, and time.  Skin: Skin is warm.  Psychiatric: She has a normal mood and affect.    ED Course  Procedures (including critical care time) Labs Review Labs Reviewed  CBC WITH DIFFERENTIAL - Abnormal; Notable for the following:    MCH 25.5 (*)    Neutrophils Relative % 79 (*)    All other components within normal limits  COMPREHENSIVE METABOLIC PANEL - Abnormal; Notable for the following:    Sodium 136 (*)    Glucose, Bld 176 (*)    AST 40 (*)    Anion gap 16 (*)    All other components within normal limits  URINALYSIS, ROUTINE W REFLEX MICROSCOPIC - Abnormal; Notable for the following:    Ketones, ur >80 (*)    All other components within normal limits  C-REACTIVE PROTEIN - Abnormal; Notable for the following:    CRP <0.5 (*)    All other components within normal limits  CBC WITH DIFFERENTIAL - Abnormal; Notable for the following:    Hemoglobin 11.2 (*)    HCT 35.8 (*)    MCH 24.8 (*)    All other components within normal limits  COMPREHENSIVE METABOLIC PANEL - Abnormal; Notable for the following:    Glucose, Bld 147 (*)    AST 41 (*)    Anion gap 17 (*)    All other components within normal limits  GLUCOSE, CAPILLARY - Abnormal; Notable for the following:    Glucose-Capillary 143 (*)    All other components within normal limits  GLUCOSE, CAPILLARY - Abnormal; Notable for the following:    Glucose-Capillary 192  (*)    All other components within normal limits  GLUCOSE, CAPILLARY - Abnormal; Notable for the following:    Glucose-Capillary 194 (*)    All other components within normal limits  BASIC METABOLIC PANEL - Abnormal; Notable for the following:    Glucose, Bld 175 (*)    Calcium 8.3 (*)    All other components within normal limits  CBC - Abnormal; Notable for the following:    Hemoglobin 10.5 (*)    HCT 34.1 (*)    MCH 24.8 (*)    RDW 15.7 (*)    All other  components within normal limits  GLUCOSE, CAPILLARY - Abnormal; Notable for the following:    Glucose-Capillary 155 (*)    All other components within normal limits  GLUCOSE, CAPILLARY - Abnormal; Notable for the following:    Glucose-Capillary 112 (*)    All other components within normal limits  GLUCOSE, CAPILLARY - Abnormal; Notable for the following:    Glucose-Capillary 163 (*)    All other components within normal limits  GLUCOSE, CAPILLARY - Abnormal; Notable for the following:    Glucose-Capillary 173 (*)    All other components within normal limits  GLUCOSE, CAPILLARY - Abnormal; Notable for the following:    Glucose-Capillary 137 (*)    All other components within normal limits  GLUCOSE, CAPILLARY - Abnormal; Notable for the following:    Glucose-Capillary 237 (*)    All other components within normal limits  CBG MONITORING, ED - Abnormal; Notable for the following:    Glucose-Capillary 145 (*)    All other components within normal limits  CLOSTRIDIUM DIFFICILE BY PCR  LIPASE, BLOOD  SEDIMENTATION RATE  GI PATHOGEN PANEL BY PCR, STOOL    Imaging Review Ct Abdomen Pelvis W Contrast  09/06/2013   CLINICAL DATA:  Upper abdominal pain  EXAM: CT ABDOMEN AND PELVIS WITH CONTRAST  TECHNIQUE: Multidetector CT imaging of the abdomen and pelvis was performed using the standard protocol following bolus administration of intravenous contrast.  CONTRAST:  31mL OMNIPAQUE IOHEXOL 300 MG/ML  SOLN  COMPARISON:  None.  FINDINGS:  Lung bases are free of acute infiltrate or sizable effusion.  The liver is diffusely fatty infiltrated. The gallbladder has been surgically removed. The spleen, adrenal glands and pancreas are within normal limits. The kidneys are well visualized and demonstrates some cystic changes bilaterally. No renal calculi or obstructive changes are seen.  Small bowel dilatation is noted in the mid jejunum and distal ileum although no definitive obstructive changes seen. There are postsurgical changes in the right lower quadrant consistent with prior small bowel resections although they do not appear to be obstructive in nature. No definitive transition zone is seen.  Bladder is well distended. No free pelvic fluid is seen. No all sidewall abnormality is noted. The osseous structures are within normal limits.  IMPRESSION: Small bowel dilatation without definitive transition zone.  Chronic changes without acute abnormality.   Electronically Signed   By: Inez Catalina M.D.   On: 09/06/2013 20:14     EKG Interpretation None      MDM   Final diagnoses:  Lower abdominal pain   62 y/o female with PMH Crones that presented to nausea/vomiting with out fever. Patient evaluated with CT abd/pelvis and found to have dialated loops of bowel with no transition point. Screening labs non-contributory. U/A positive for ketones.  Patient given fluids and zofran in the ED, refused pain medications on multiple occasions.  GI consulted and state that patient should be started on prednisone which she refused because she does not like the way it makes her feel. Patient noted to be actively vomiting in the department and was admitted for inability to tolerate PO to the [facility] internal medicine service.     Claudean Severance, MD 09/08/13 1827

## 2013-09-06 NOTE — ED Notes (Signed)
Dr. Shepard at bedside.  

## 2013-09-06 NOTE — ED Notes (Signed)
Notified CT that patient is done with contrast.

## 2013-09-07 ENCOUNTER — Encounter (HOSPITAL_COMMUNITY): Payer: Self-pay | Admitting: *Deleted

## 2013-09-07 DIAGNOSIS — H547 Unspecified visual loss: Secondary | ICD-10-CM | POA: Diagnosis present

## 2013-09-07 DIAGNOSIS — E785 Hyperlipidemia, unspecified: Secondary | ICD-10-CM | POA: Diagnosis present

## 2013-09-07 DIAGNOSIS — I10 Essential (primary) hypertension: Secondary | ICD-10-CM | POA: Diagnosis present

## 2013-09-07 DIAGNOSIS — K56609 Unspecified intestinal obstruction, unspecified as to partial versus complete obstruction: Principal | ICD-10-CM

## 2013-09-07 DIAGNOSIS — K219 Gastro-esophageal reflux disease without esophagitis: Secondary | ICD-10-CM | POA: Diagnosis present

## 2013-09-07 DIAGNOSIS — K5000 Crohn's disease of small intestine without complications: Secondary | ICD-10-CM | POA: Diagnosis present

## 2013-09-07 DIAGNOSIS — K5010 Crohn's disease of large intestine without complications: Secondary | ICD-10-CM

## 2013-09-07 DIAGNOSIS — Z7982 Long term (current) use of aspirin: Secondary | ICD-10-CM | POA: Diagnosis not present

## 2013-09-07 DIAGNOSIS — E860 Dehydration: Secondary | ICD-10-CM | POA: Diagnosis present

## 2013-09-07 DIAGNOSIS — K5090 Crohn's disease, unspecified, without complications: Secondary | ICD-10-CM | POA: Insufficient documentation

## 2013-09-07 DIAGNOSIS — E039 Hypothyroidism, unspecified: Secondary | ICD-10-CM

## 2013-09-07 DIAGNOSIS — E119 Type 2 diabetes mellitus without complications: Secondary | ICD-10-CM | POA: Insufficient documentation

## 2013-09-07 DIAGNOSIS — K5900 Constipation, unspecified: Secondary | ICD-10-CM | POA: Diagnosis present

## 2013-09-07 DIAGNOSIS — R109 Unspecified abdominal pain: Secondary | ICD-10-CM | POA: Diagnosis present

## 2013-09-07 DIAGNOSIS — Z8673 Personal history of transient ischemic attack (TIA), and cerebral infarction without residual deficits: Secondary | ICD-10-CM | POA: Diagnosis not present

## 2013-09-07 DIAGNOSIS — Z87891 Personal history of nicotine dependence: Secondary | ICD-10-CM | POA: Diagnosis not present

## 2013-09-07 LAB — CBC WITH DIFFERENTIAL/PLATELET
Basophils Absolute: 0 10*3/uL (ref 0.0–0.1)
Basophils Relative: 0 % (ref 0–1)
EOS PCT: 2 % (ref 0–5)
Eosinophils Absolute: 0.2 10*3/uL (ref 0.0–0.7)
HEMATOCRIT: 35.8 % — AB (ref 36.0–46.0)
Hemoglobin: 11.2 g/dL — ABNORMAL LOW (ref 12.0–15.0)
LYMPHS ABS: 2.3 10*3/uL (ref 0.7–4.0)
LYMPHS PCT: 25 % (ref 12–46)
MCH: 24.8 pg — ABNORMAL LOW (ref 26.0–34.0)
MCHC: 31.3 g/dL (ref 30.0–36.0)
MCV: 79.2 fL (ref 78.0–100.0)
Monocytes Absolute: 0.8 10*3/uL (ref 0.1–1.0)
Monocytes Relative: 8 % (ref 3–12)
Neutro Abs: 6.1 10*3/uL (ref 1.7–7.7)
Neutrophils Relative %: 65 % (ref 43–77)
Platelets: 189 10*3/uL (ref 150–400)
RBC: 4.52 MIL/uL (ref 3.87–5.11)
RDW: 15.4 % (ref 11.5–15.5)
WBC: 9.3 10*3/uL (ref 4.0–10.5)

## 2013-09-07 LAB — GLUCOSE, CAPILLARY
GLUCOSE-CAPILLARY: 143 mg/dL — AB (ref 70–99)
Glucose-Capillary: 192 mg/dL — ABNORMAL HIGH (ref 70–99)
Glucose-Capillary: 194 mg/dL — ABNORMAL HIGH (ref 70–99)
Glucose-Capillary: 155 mg/dL — ABNORMAL HIGH (ref 70–99)
Glucose-Capillary: 112 mg/dL — ABNORMAL HIGH (ref 70–99)

## 2013-09-07 LAB — SEDIMENTATION RATE: Sed Rate: 2 mm/hr (ref 0–22)

## 2013-09-07 LAB — C-REACTIVE PROTEIN: CRP: <0.5 mg/dL — ABNORMAL LOW (ref <0.60)

## 2013-09-07 LAB — COMPREHENSIVE METABOLIC PANEL
ALT: 31 U/L (ref 0–35)
ANION GAP: 17 — AB (ref 5–15)
AST: 41 U/L — ABNORMAL HIGH (ref 0–37)
Albumin: 3.5 g/dL (ref 3.5–5.2)
Alkaline Phosphatase: 53 U/L (ref 39–117)
BUN: 9 mg/dL (ref 6–23)
CO2: 19 meq/L (ref 19–32)
CREATININE: 0.6 mg/dL (ref 0.50–1.10)
Calcium: 8.7 mg/dL (ref 8.4–10.5)
Chloride: 102 mEq/L (ref 96–112)
GLUCOSE: 147 mg/dL — AB (ref 70–99)
Potassium: 3.9 mEq/L (ref 3.7–5.3)
Sodium: 138 mEq/L (ref 137–147)
Total Bilirubin: 0.4 mg/dL (ref 0.3–1.2)
Total Protein: 6.2 g/dL (ref 6.0–8.3)

## 2013-09-07 MED ORDER — ONDANSETRON HCL 4 MG PO TABS: 4.0000 mg | ORAL_TABLET | Freq: Four times a day (QID) | ORAL | Status: DC | PRN

## 2013-09-07 MED ORDER — BUDESONIDE 3 MG PO CP24
9.0000 mg | ORAL_CAPSULE | Freq: Every day | ORAL | Status: DC
  Administered 2013-09-07 – 2013-09-08 (×2): 9 mg via ORAL
  Filled 2013-09-07 (×2): qty 3

## 2013-09-07 MED ORDER — CIPROFLOXACIN IN D5W 400 MG/200ML IV SOLN
400.0000 mg | Freq: Once | INTRAVENOUS | Status: AC
  Administered 2013-09-07: 400 mg via INTRAVENOUS
  Filled 2013-09-07: qty 200

## 2013-09-07 MED ORDER — SODIUM CHLORIDE 0.9 % IV SOLN
INTRAVENOUS | Status: DC
  Administered 2013-09-07 (×2): via INTRAVENOUS

## 2013-09-07 MED ORDER — MORPHINE SULFATE 2 MG/ML IJ SOLN: 2.0000 mg | INTRAMUSCULAR | Status: DC | PRN

## 2013-09-07 MED ORDER — PANTOPRAZOLE SODIUM 40 MG PO TBEC
40.0000 mg | DELAYED_RELEASE_TABLET | Freq: Every day | ORAL | Status: DC
  Administered 2013-09-07 – 2013-09-10 (×4): 40 mg via ORAL
  Filled 2013-09-07 (×4): qty 1

## 2013-09-07 MED ORDER — TIZANIDINE HCL 4 MG PO TABS
4.0000 mg | ORAL_TABLET | Freq: Every day | ORAL | Status: DC
  Administered 2013-09-07 – 2013-09-09 (×3): 4 mg via ORAL
  Filled 2013-09-07 (×4): qty 1

## 2013-09-07 MED ORDER — INSULIN ASPART 100 UNIT/ML ~~LOC~~ SOLN: 0.0000 [IU] | Freq: Three times a day (TID) | SUBCUTANEOUS | Status: DC

## 2013-09-07 MED ORDER — TIZANIDINE HCL 2 MG PO TABS
1.0000 mg | ORAL_TABLET | Freq: Every day | ORAL | Status: DC
  Administered 2013-09-08 – 2013-09-10 (×3): 1 mg via ORAL
  Filled 2013-09-07 (×5): qty 0.5

## 2013-09-07 MED ORDER — ACETAMINOPHEN 650 MG RE SUPP: 650.0000 mg | Freq: Four times a day (QID) | RECTAL | Status: DC | PRN

## 2013-09-07 MED ORDER — METRONIDAZOLE IN NACL 5-0.79 MG/ML-% IV SOLN
500.0000 mg | Freq: Three times a day (TID) | INTRAVENOUS | Status: DC
  Administered 2013-09-07 – 2013-09-08 (×4): 500 mg via INTRAVENOUS
  Filled 2013-09-07 (×7): qty 100

## 2013-09-07 MED ORDER — ASPIRIN 81 MG PO CHEW
81.0000 mg | CHEWABLE_TABLET | Freq: Every day | ORAL | Status: DC
  Administered 2013-09-07 – 2013-09-10 (×4): 81 mg via ORAL
  Filled 2013-09-07 (×4): qty 1

## 2013-09-07 MED ORDER — INSULIN ASPART 100 UNIT/ML ~~LOC~~ SOLN
0.0000 [IU] | Freq: Three times a day (TID) | SUBCUTANEOUS | Status: DC
  Administered 2013-09-07 (×2): 2 [IU] via SUBCUTANEOUS
  Administered 2013-09-08: 3 [IU] via SUBCUTANEOUS
  Administered 2013-09-08: 1 [IU] via SUBCUTANEOUS
  Administered 2013-09-08: 2 [IU] via SUBCUTANEOUS

## 2013-09-07 MED ORDER — INSULIN ASPART 100 UNIT/ML ~~LOC~~ SOLN
0.0000 [IU] | Freq: Four times a day (QID) | SUBCUTANEOUS | Status: DC
  Administered 2013-09-07: 2 [IU] via SUBCUTANEOUS

## 2013-09-07 MED ORDER — ONDANSETRON 8 MG/NS 50 ML IVPB: 8.0000 mg | Freq: Four times a day (QID) | INTRAVENOUS | Status: DC | PRN

## 2013-09-07 MED ORDER — SIMVASTATIN 10 MG PO TABS
10.0000 mg | ORAL_TABLET | Freq: Every day | ORAL | Status: DC
  Administered 2013-09-07 – 2013-09-09 (×3): 10 mg via ORAL
  Filled 2013-09-07 (×4): qty 1

## 2013-09-07 MED ORDER — LEVOTHYROXINE SODIUM 125 MCG PO TABS
125.0000 ug | ORAL_TABLET | Freq: Every day | ORAL | Status: DC
  Administered 2013-09-07 – 2013-09-10 (×4): 125 ug via ORAL
  Filled 2013-09-07 (×5): qty 1

## 2013-09-07 MED ORDER — ACETAMINOPHEN 325 MG PO TABS: 650.0000 mg | ORAL_TABLET | Freq: Four times a day (QID) | ORAL | Status: DC | PRN

## 2013-09-07 MED ORDER — HEPARIN SODIUM (PORCINE) 5000 UNIT/ML IJ SOLN
5000.0000 [IU] | Freq: Three times a day (TID) | INTRAMUSCULAR | Status: DC
  Filled 2013-09-07 (×12): qty 1

## 2013-09-07 MED ORDER — TIZANIDINE HCL 2 MG PO TABS
1.0000 mg | ORAL_TABLET | Freq: Three times a day (TID) | ORAL | Status: DC | PRN
  Filled 2013-09-07: qty 0.5

## 2013-09-07 MED ORDER — TIZANIDINE HCL 2 MG PO TABS
1.0000 mg | ORAL_TABLET | Freq: Every day | ORAL | Status: DC
  Administered 2013-09-07 – 2013-09-09 (×3): 1 mg via ORAL
  Filled 2013-09-07 (×5): qty 0.5

## 2013-09-07 MED ORDER — CIPROFLOXACIN IN D5W 400 MG/200ML IV SOLN
400.0000 mg | Freq: Two times a day (BID) | INTRAVENOUS | Status: DC
  Filled 2013-09-07: qty 200

## 2013-09-07 NOTE — Progress Notes (Signed)
Subjective: Patient admitted this morning with abdominal pain, see H&p by Dr Posey Pronto for details. Filed Vitals:   09/07/13 1501  BP: 97/64  Pulse: 73  Temp: 98.2 F (36.8 C)  Resp: 16    Chest: Clear Bilaterally Heart : S1S2 RRR Abdomen: Soft, nontender Ext : No edema Neuro: Alert, oriented x 3  A/P Patient says that she is feeling better, has been followed by [facility] for Crohn's disease.  CT Abdomen shows small bowel dilation in the mid jejunum and distal ileum. Will consult [facility] for further recommendations.    [place] Hospitalist [phone]

## 2013-09-07 NOTE — H&P (Signed)
[facility] History and Physical  Patient: [name]  IOM:[id number]  DOB: 01/14/1951  DOS: the patient was seen and examined on 09/07/2013 PCP: [name], MD  Chief Complaint: Abdominal pain, nausea  HPI: [name] is a 63 y.o. female with Past medical history of Crohn's colitis status post resection, diabetes, GERD, hypertension, stroke, depression. Patient presented with complaints of abdominal pain that has been ongoing since earlier this morning. The pain is located diffusely and is crampy in nature. She also had episodes of nausea and dry heaving this. She denies any fever or chills. She denies any diarrhea now that she has constipation. She denies any burning urination. She denies any active bleeding. She is not on any medications for Crohn's as her Crohn's is fairly controlled. She mentions she has chronic diarrhea which she uses colestipol for which causes her to have chronic constipation. She mentions she has once or twice a year for that her up for Crohn's disease.  The patient is coming from home. And at her baseline independent for most of her ADL.  Review of Systems: as mentioned in the history of present illness.  A Comprehensive review of the other systems is negative.  Past Medical History  Diagnosis Date  . Diabetes mellitus   . GERD (gastroesophageal reflux disease)   . Hyperlipidemia   . Hypertension   . Stroke   . Thyroid disease   . Visual disturbance     Patient has vision loss.  . Constipation   . Bruises easily   . Lump in female breast   . Hemiparesis and alteration of sensations as late effects of stroke 01/26/2013  . Abnormality of gait 01/26/2013  . Dizziness and giddiness 01/26/2013  . Crohn's disease   . Depression   . Panic disorder    Past Surgical History  Procedure Laterality Date  . Cesarean section  1981  . Cesarean section  1983  . Cholecystectomy  1986  . Small intestine surgery  05/1991  . Av fistula repair  2008  .  Breast surgery     Social History:  reports that she quit smoking about 20 years ago. She has never used smokeless tobacco. She reports that she does not drink alcohol or use illicit drugs.  Allergies  Allergen Reactions  . Codeine Other (See Comments)    Syncope.  . Morphine And Related Nausea And Vomiting and Other (See Comments)    Hallucinations.  . Baclofen Nausea Only and Other (See Comments)    dizziness  . Sulfur Itching    Per patient happened years ago.    Family History  Problem Relation Age of Onset  . Heart disease Mother   . Heart disease Father   . Diabetes Brother   . Diabetes Brother   . Migraines Son     Prior to Admission medications   Medication Sig Start Date End Date Taking? Authorizing Provider  aspirin 81 MG tablet Take 81 mg by mouth daily.   Yes Historical Provider, MD  JANUVIA 100 MG tablet Take 100 mg by mouth Daily.  04/13/11  Yes Historical Provider, MD  levothyroxine (SYNTHROID, LEVOTHROID) 125 MCG tablet Take 125 mcg by mouth Daily.  04/01/11  Yes Historical Provider, MD  MICRONIZED COLESTIPOL HCL 1 G tablet Take 1 g by mouth daily. 01/06/13  Yes Historical Provider, MD  pantoprazole (PROTONIX) 40 MG tablet Take 40 mg by mouth daily.  04/01/11  Yes Historical Provider, MD  ramipril (ALTACE) 2.5 MG capsule Take 2.5  mg by mouth daily.  04/01/11  Yes Historical Provider, MD  repaglinide (PRANDIN) 0.5 MG tablet Take 0.5 mg by mouth 2 (two) times daily before a meal.  01/11/13  Yes Historical Provider, MD  simvastatin (ZOCOR) 10 MG tablet Take 10 mg by mouth Daily.  04/01/11  Yes Historical Provider, MD  tiZANidine (ZANAFLEX) 2 MG tablet Take 2-4 mg by mouth every 6 (six) hours as needed for muscle spasms. Take 23m every morning, 163mat noon, and 51m151mt bedtime.   Yes Historical Provider, MD    Physical Exam: Filed Vitals:   09/07/13 0038 09/07/13 0052 09/07/13 0100 09/07/13 0142  BP: 150/82 150/82 126/86 136/74  Pulse: 109 103 99 111  Temp:  98.6 F (37  C)  98.5 F (36.9 C)  TempSrc:  Oral  Oral  Resp: 18 18 15 18   Height:    5' 4"  (1.626 m)  Weight:    77.61 kg (171 lb 1.6 oz)  SpO2: 97% 98% 96% 99%    General: Alert, Awake and Oriented to Time, Place and Person. Appear in mild distress Eyes: PERRL ENT: Oral Mucosa clear moist. Neck: no JVD Cardiovascular: S1 and S2 Present, no Murmur, Peripheral Pulses Present Respiratory: Bilateral Air entry equal and Decreased, Clear to Auscultation, noCrackles, no wheezes Abdomen: Bowel Sound Present, Soft and diffuse abdomen tenderness without guarding or rigidity Skin: no Rash Extremities: no Pedal edema, no calf tenderness Neurologic: Grossly no focal neuro deficit.  Labs on Admission:  CBC:  Recent Labs Lab 09/06/13 1739 09/07/13 0347  WBC 8.9 9.3  NEUTROABS 7.0 6.1  HGB 12.5 11.2*  HCT 39.7 35.8*  MCV 80.9 79.2  PLT 198 189    CMP     Component Value Date/Time   NA 138 09/07/2013 0347   K 3.9 09/07/2013 0347   CL 102 09/07/2013 0347   CO2 19 09/07/2013 0347   GLUCOSE 147* 09/07/2013 0347   BUN 9 09/07/2013 0347   CREATININE 0.60 09/07/2013 0347   CALCIUM 8.7 09/07/2013 0347   PROT 6.2 09/07/2013 0347   ALBUMIN 3.5 09/07/2013 0347   AST 41* 09/07/2013 0347   ALT 31 09/07/2013 0347   ALKPHOS 53 09/07/2013 0347   BILITOT 0.4 09/07/2013 0347   GFRNONAA >90 09/07/2013 0347   GFRAA >90 09/07/2013 0347     Recent Labs Lab 09/06/13 1739  LIPASE 45   No results found for this basename: AMMONIA,  in the last 168 hours  No results found for this basename: CKTOTAL, CKMB, CKMBINDEX, TROPONINI,  in the last 168 hours BNP (last 3 results) No results found for this basename: PROBNP,  in the last 8760 hours  Radiological Exams on Admission: Ct Abdomen Pelvis W Contrast  09/06/2013   CLINICAL DATA:  Upper abdominal pain  EXAM: CT ABDOMEN AND PELVIS WITH CONTRAST  TECHNIQUE: Multidetector CT imaging of the abdomen and pelvis was performed using the standard protocol following bolus  administration of intravenous contrast.  CONTRAST:  40m451mNIPAQUE IOHEXOL 300 MG/ML  SOLN  COMPARISON:  None.  FINDINGS: Lung bases are free of acute infiltrate or sizable effusion.  The liver is diffusely fatty infiltrated. The gallbladder has been surgically removed. The spleen, adrenal glands and pancreas are within normal limits. The kidneys are well visualized and demonstrates some cystic changes bilaterally. No renal calculi or obstructive changes are seen.  Small bowel dilatation is noted in the mid jejunum and distal ileum although no definitive obstructive changes seen. There are postsurgical changes in  the right lower quadrant consistent with prior small bowel resections although they do not appear to be obstructive in nature. No definitive transition zone is seen.  Bladder is well distended. No free pelvic fluid is seen. No all sidewall abnormality is noted. The osseous structures are within normal limits.  IMPRESSION: Small bowel dilatation without definitive transition zone.  Chronic changes without acute abnormality.   Electronically Signed   By: Inez Catalina M.D.   On: 09/06/2013 20:14    Assessment/Plan Principal Problem:   Crohn's colitis Active Problems:   Type II or unspecified type diabetes mellitus without mention of complication, not stated as uncontrolled   Unspecified hypothyroidism   1. Crohn's colitis Patient presents with complaints of abdominal pain. A CT of the abdomen was showing small bowel medication with possible obstruction without any transition point. Currently patient will be kept n.p.o. except medication. I will give her IV fluids. She patient has refused prednisone. And therefore second option would be a for the patient will be to provide Entocort Also Cipro and Flagyl. Also C. difficile and GI pathogen check.  2. Diabetes mellitus. Holding her oral hypoglycemic medications and placing her on sliding scale.  3. Hypothyroidism Continue Synthroid.  Consults:  Gastroenterology  DVT Prophylaxis: subcutaneous Heparin Nutrition: N.p.o. except medications  Code Status: Full  Disposition: Admitted to inpatient in med-surge unit.  Author: Berle Mull, MD [facility] Pager: [phone] 09/07/2013, 4:49 AM    If 7PM-7AM, please contact night-coverage [email or web address] Password [email or web address]  **Disclaimer: This note may have been dictated with voice recognition software. Similar sounding words can inadvertently be transcribed and this note may contain transcription errors which may not have been corrected upon publication of note.**

## 2013-09-07 NOTE — ED Notes (Addendum)
Patient refusing prednisone b/c "it makes me hallucinate." Resident notified.

## 2013-09-07 NOTE — Consult Note (Signed)
Date: 09/07/2013               Patient Name:  [name] MRN: [id number]  DOB: 1950-09-03 Age / Sex: 63 y.o., female   PCP: [name], MD         Requesting Physician: Dr. Oswald Hillock, MD    Consulting Reason:  Crohn's Disease     Chief Complaint: abdominal pain  History of Present Illness: [name] is a 63 year old woman with Crohn's disease s/p small bowel resection in 1993 presenting with abdominal pain, nausea, vomiting. The abdominal pain started Tuesday morning. She describes it as a sharp spasm in the R mid to upper quadrant that comes and goes. It is exacerbated by PO intake. No alleviating factors. She had nausea and nonbloody, nonbilious emesis x 1 yesterday. Reports this is similar to her previous Crohn's flares - last one was in 2013. Her last BM was yesterday - small formed, constipated. Her previous stools have been formed except for 1 BM on Monday which was small, watery diarrhea. She has been taking colestipol for her chronic diarrhea. Denies hematochezia or melena. Last colonoscopy was 1 year ago and normal per patient. She denies fevers, chest pain, shortness of breath, lightheadedness, cough, vision changes, dysuria, hematuria, changes in her chronic paresthesias.  Meds: Current Facility-Administered Medications  Medication Dose Route Frequency Provider Last Rate Last Dose  . 0.9 %  sodium chloride infusion   Intravenous Continuous Berle Mull, MD 75 mL/hr at 09/07/13 0254    . acetaminophen (TYLENOL) tablet 650 mg  650 mg Oral Q6H PRN Berle Mull, MD       Or  . acetaminophen (TYLENOL) suppository 650 mg  650 mg Rectal Q6H PRN Berle Mull, MD      . aspirin chewable tablet 81 mg  81 mg Oral Daily Berle Mull, MD   81 mg at 09/07/13 1026  . budesonide (ENTOCORT EC) 24 hr capsule 9 mg  9 mg Oral Daily Berle Mull, MD   9 mg at 09/07/13 0320  . heparin injection 5,000 Units  5,000 Units Subcutaneous 3 times per day Berle Mull, MD      . insulin aspart  (novoLOG) injection 0-9 Units  0-9 Units Subcutaneous TID AC & HS Oswald Hillock, MD   2 Units at 09/07/13 1226  . levothyroxine (SYNTHROID, LEVOTHROID) tablet 125 mcg  125 mcg Oral QAC breakfast Berle Mull, MD   125 mcg at 09/07/13 0749  . metroNIDAZOLE (FLAGYL) IVPB 500 mg  500 mg Intravenous Q8H Berle Mull, MD   500 mg at 09/07/13 1026  . morphine 2 MG/ML injection 2 mg  2 mg Intravenous Q4H PRN Berle Mull, MD      . ondansetron (ZOFRAN) tablet 4 mg  4 mg Oral Q6H PRN Berle Mull, MD       Or  . ondansetron (ZOFRAN) 8 mg/NS 50 ml IVPB  8 mg Intravenous Q6H PRN Berle Mull, MD      . pantoprazole (PROTONIX) EC tablet 40 mg  40 mg Oral QAC breakfast Berle Mull, MD   40 mg at 09/07/13 0749  . simvastatin (ZOCOR) tablet 10 mg  10 mg Oral q1800 Berle Mull, MD      . Derrill Memo ON 09/08/2013] tiZANidine (ZANAFLEX) tablet 1 mg  1 mg Oral Q breakfast Oswald Hillock, MD      . tiZANidine (ZANAFLEX) tablet 1 mg  1 mg Oral QAC lunch Oswald Hillock, MD   1 mg at 09/07/13  1227  . tiZANidine (ZANAFLEX) tablet 4 mg  4 mg Oral QHS Oswald Hillock, MD        Allergies: Allergies as of 09/06/2013 - Review Complete 09/06/2013  Allergen Reaction Noted  . Codeine Other (See Comments) 04/17/2011  . Morphine and related Nausea And Vomiting and Other (See Comments) 04/17/2011  . Baclofen Nausea Only and Other (See Comments) 01/26/2013  . Sulfur Itching 04/17/2011   Past Medical History  Diagnosis Date  . Diabetes mellitus   . GERD (gastroesophageal reflux disease)   . Hyperlipidemia   . Hypertension   . Stroke   . Thyroid disease   . Visual disturbance     Patient has vision loss.  . Constipation   . Bruises easily   . Lump in female breast   . Hemiparesis and alteration of sensations as late effects of stroke 01/26/2013  . Abnormality of gait 01/26/2013  . Dizziness and giddiness 01/26/2013  . Crohn's disease   . Depression   . Panic disorder    Past Surgical History  Procedure Laterality Date  .  Cesarean section  1981  . Cesarean section  1983  . Cholecystectomy  1986  . Small intestine surgery  05/1991  . Av fistula repair  2008  . Breast surgery     Family History  Problem Relation Age of Onset  . Heart disease Mother   . Heart disease Father   . Diabetes Brother   . Diabetes Brother   . Migraines Son    History   Social History  . Marital Status: Married    Spouse Name: [name]    Number of Children: 2  . 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 Smoking status: Former Smoker    Quit date: 04/16/1993  . Smokeless tobacco: Never Used  . Alcohol Use: No  . Drug Use: No  . Sexual Activity: Not on file   Other Topics Concern  . Not on file   Social History Narrative   Patient is married [name]), has 2 children   Patient is right handed   Education level is Doctorate   Caffeine consumption is rarely, drinks decaf    Review of Systems: Constitutional: +chills, denies fevers Eyes: chronic nystagmus and vision loss Respiratory: denies cough, shortness of breath Cardiovascular: denies chest pain GI: as per HPI Genitourinary: denies dysuria or hematuria Neurological: chronic left hemi paresthesias  Physical Exam: Blood pressure 128/66, pulse 89, temperature 98 F (36.7 C), temperature source Oral, resp. rate 18, height _0  (1.626 m), weight 171 lb 1.6 oz (77.61 kg), SpO2 99.00%. BP 128/66  Pulse 89  Temp(Src) 98 F (36.7 C) (Oral)  Resp 18  Ht _1  (1.626 m)  Wt 171 lb 1.6 oz (77.61 kg)  BMI 29.35 kg/m2  SpO2 99%  General Appearance:    Alert, cooperative, no distress  Head:    Normocephalic, without obvious abnormality, atraumatic  Eyes:    EOMI  Ears:    Normal external ear canals, both ears  Nose:   Nares normal, septum midline, mucosa normal, no drainage    or sinus tenderness  Throat:   Lips, mucosa, and tongue normal  Neck:   Supple, symmetrical, trachea midline, no  adenopathy  Lungs:     Clear to auscultation bilaterally, respirations unlabored   Heart:    Regular rate and rhythm, S1 and S2 normal, no murmur, rub  or gallop  Abdomen:     Soft, mild tenderness in upper quadrants, bowel sounds active all four quadrants, no masses, no organomegaly  Extremities:   Extremities normal, atraumatic, no cyanosis or edema  Skin:   Skin color, texture, turgor normal, no rashes or lesions  Neurologic:   Grossly intact    Lab results: Basic Metabolic Panel:  Recent Labs  09/06/13 1739 09/07/13 0347  NA 136* 138  K 4.4 3.9  CL 98 102  CO2 22 19  GLUCOSE 176* 147*  BUN 12 9  CREATININE 0.56 0.60  CALCIUM 9.3 8.7   Liver Function Tests:  Recent Labs  09/06/13 1739 09/07/13 0347  AST 40* 41*  ALT 29 31  ALKPHOS 57 53  BILITOT 0.5 0.4  PROT 7.7 6.2  ALBUMIN 4.3 3.5    Recent Labs  09/06/13 1739  LIPASE 45   CBC:  Recent Labs  09/06/13 1739 09/07/13 0347  WBC 8.9 9.3  NEUTROABS 7.0 6.1  HGB 12.5 11.2*  HCT 39.7 35.8*  MCV 80.9 79.2  PLT 198 189   CBG:  Recent Labs  09/06/13 2356 09/07/13 0322 09/07/13 0734 09/07/13 1205  GLUCAP 145* 143* 192* 194*   Urinalysis:  Recent Labs  09/06/13 1923  COLORURINE YELLOW  LABSPEC 1.022  PHURINE 6.0  GLUCOSEU NEGATIVE  HGBUR NEGATIVE  BILIRUBINUR NEGATIVE  KETONESUR >80*  PROTEINUR NEGATIVE  UROBILINOGEN 1.0  NITRITE NEGATIVE  LEUKOCYTESUR NEGATIVE   Misc. Labs: ESR 2 CRP <0.5  Imaging results:  Ct Abdomen Pelvis W Contrast  09/06/2013   CLINICAL DATA:  Upper abdominal pain  EXAM: CT ABDOMEN AND PELVIS WITH CONTRAST  TECHNIQUE: Multidetector CT imaging of the abdomen and pelvis was performed using the standard protocol following bolus administration of intravenous contrast.  CONTRAST:  63m OMNIPAQUE IOHEXOL 300 MG/ML  SOLN  COMPARISON:  None.  FINDINGS: Lung bases are free of acute infiltrate or sizable effusion.  The liver is diffusely fatty infiltrated. The  gallbladder has been surgically removed. The spleen, adrenal glands and pancreas are within normal limits. The kidneys are well visualized and demonstrates some cystic changes bilaterally. No renal calculi or obstructive changes are seen.  Small bowel dilatation is noted in the mid jejunum and distal ileum although no definitive obstructive changes seen. There are postsurgical changes in the right lower quadrant consistent with prior small bowel resections although they do not appear to be obstructive in nature. No definitive transition zone is seen.  Bladder is well distended. No free pelvic fluid is seen. No all sidewall abnormality is noted. The osseous structures are within normal limits.  IMPRESSION: Small bowel dilatation without definitive transition zone.  Chronic changes without acute abnormality.   Electronically Signed   By: MInez CatalinaM.D.   On: 09/06/2013 20:14   Assessment, Plan, & Recommendations by Problem: 63year old woman with Crohn's disease s/p small bowel resection in 1993 presenting with abdominal pain, nausea, vomiting. CT A/P with small bowel dilatation in mid jejunum and distal ileum without definitive obstructive changes. Concern for acute partial bowel obstruction. Less likely to be Crohn's flare given lack of inflammatory changes on CT scan and neg ESR and CRP.  Recommendations: -Bowel rest with sips, ice chips -Can consider dulcolax suppository -Consider NG tube placement if patient's nausea, vomiting, distention worsens  Pt seen and discussed with attending physician, Dr. OPaulita Fujita Signed: JJacques Earthly MD 09/07/2013, 1:02 PM

## 2013-09-07 NOTE — Consult Note (Signed)
[facility] Gastroenterology Consultation Note  Referring Provider: Dr. Eleonore Chiquito [facility]) Primary Care Physician:  [name], MD Primary Gastroenterologist:  Dr. Earle Gell  Reason for Consultation:  Abdominal pain, history small bowel Crohn's disease  HPI: [name] is a 63 y.o. female admitted for abdominal pain.  Started abruptly last night, periumbilical, had one episode of vomiting.  Has chronic diarrhea, on colestipol, but for the past couple days she's been constipated, in fact for bowel movement for over 24 hours.  She is passing some small amounts of gas.  History of Crohn's ileitis with history of small bowel resection about 20 years ago.  Her Crohn's has been quiescent, and she has not required any medication for it for several years.  CT recently showed small bowel dilatation without focal transition point.  No inflammatory changes noted.   Past Medical History  Diagnosis Date  . Diabetes mellitus   . GERD (gastroesophageal reflux disease)   . Hyperlipidemia   . Hypertension   . Stroke   . Thyroid disease   . Visual disturbance     Patient has vision loss.  . Constipation   . Bruises easily   . Lump in female breast   . Hemiparesis and alteration of sensations as late effects of stroke 01/26/2013  . Abnormality of gait 01/26/2013  . Dizziness and giddiness 01/26/2013  . Crohn's disease   . Depression   . Panic disorder     Past Surgical History  Procedure Laterality Date  . Cesarean section  1981  . Cesarean section  1983  . Cholecystectomy  1986  . Small intestine surgery  05/1991  . Av fistula repair  2008  . Breast surgery      Prior to Admission medications   Medication Sig Start Date End Date Taking? Authorizing Provider  aspirin 81 MG tablet Take 81 mg by mouth daily.   Yes Historical Provider, MD  JANUVIA 100 MG tablet Take 100 mg by mouth Daily.  04/13/11  Yes Historical Provider, MD  levothyroxine (SYNTHROID, LEVOTHROID) 125 MCG tablet Take 125 mcg  by mouth Daily.  04/01/11  Yes Historical Provider, MD  MICRONIZED COLESTIPOL HCL 1 G tablet Take 1 g by mouth daily. 01/06/13  Yes Historical Provider, MD  pantoprazole (PROTONIX) 40 MG tablet Take 40 mg by mouth daily.  04/01/11  Yes Historical Provider, MD  ramipril (ALTACE) 2.5 MG capsule Take 2.5 mg by mouth daily.  04/01/11  Yes Historical Provider, MD  repaglinide (PRANDIN) 0.5 MG tablet Take 0.5 mg by mouth 2 (two) times daily before a meal.  01/11/13  Yes Historical Provider, MD  simvastatin (ZOCOR) 10 MG tablet Take 10 mg by mouth Daily.  04/01/11  Yes Historical Provider, MD  tiZANidine (ZANAFLEX) 2 MG tablet Take 2-4 mg by mouth every 6 (six) hours as needed for muscle spasms. Take 1mg  every morning, 1mg  at noon, and 4mg  at bedtime.   Yes Historical Provider, MD    Current Facility-Administered Medications  Medication Dose Route Frequency Provider Last Rate Last Dose  . 0.9 %  sodium chloride infusion   Intravenous Continuous Berle Mull, MD 75 mL/hr at 09/07/13 0254    . acetaminophen (TYLENOL) tablet 650 mg  650 mg Oral Q6H PRN Berle Mull, MD       Or  . acetaminophen (TYLENOL) suppository 650 mg  650 mg Rectal Q6H PRN Berle Mull, MD      . aspirin chewable tablet 81 mg  81 mg Oral Daily Berle Mull,  MD   81 mg at 09/07/13 1026  . budesonide (ENTOCORT EC) 24 hr capsule 9 mg  9 mg Oral Daily Berle Mull, MD   9 mg at 09/07/13 0320  . heparin injection 5,000 Units  5,000 Units Subcutaneous 3 times per day Berle Mull, MD      . insulin aspart (novoLOG) injection 0-9 Units  0-9 Units Subcutaneous TID AC & HS Oswald Hillock, MD   2 Units at 09/07/13 1226  . levothyroxine (SYNTHROID, LEVOTHROID) tablet 125 mcg  125 mcg Oral QAC breakfast Berle Mull, MD   125 mcg at 09/07/13 0749  . metroNIDAZOLE (FLAGYL) IVPB 500 mg  500 mg Intravenous Q8H Berle Mull, MD   500 mg at 09/07/13 1026  . morphine 2 MG/ML injection 2 mg  2 mg Intravenous Q4H PRN Berle Mull, MD      . ondansetron (ZOFRAN)  tablet 4 mg  4 mg Oral Q6H PRN Berle Mull, MD       Or  . ondansetron (ZOFRAN) 8 mg/NS 50 ml IVPB  8 mg Intravenous Q6H PRN Berle Mull, MD      . pantoprazole (PROTONIX) EC tablet 40 mg  40 mg Oral QAC breakfast Berle Mull, MD   40 mg at 09/07/13 0749  . simvastatin (ZOCOR) tablet 10 mg  10 mg Oral q1800 Berle Mull, MD      . Derrill Memo ON 09/08/2013] tiZANidine (ZANAFLEX) tablet 1 mg  1 mg Oral Q breakfast Oswald Hillock, MD      . tiZANidine (ZANAFLEX) tablet 1 mg  1 mg Oral QAC lunch Oswald Hillock, MD   1 mg at 09/07/13 1227  . tiZANidine (ZANAFLEX) tablet 4 mg  4 mg Oral QHS Oswald Hillock, MD        Allergies as of 09/06/2013 - Review Complete 09/06/2013  Allergen Reaction Noted  . Codeine Other (See Comments) 04/17/2011  . Morphine and related Nausea And Vomiting and Other (See Comments) 04/17/2011  . Baclofen Nausea Only and Other (See Comments) 01/26/2013  . Sulfur Itching 04/17/2011    Family History  Problem Relation Age of Onset  . Heart disease Mother   . Heart disease Father   . Diabetes Brother   . Diabetes Brother   . Migraines Son     History   Social History  . Marital Status: Married    Spouse Name: [name]    Number of Children: 2  . 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 Smoking status: Former Smoker    Quit date: 04/16/1993  . Smokeless tobacco: Never Used  . Alcohol Use: No  . Drug Use: No  . Sexual Activity: Not on file   Other Topics Concern  . Not on file   Social History Narrative   [name], has 2 children   Patient is right handed   Education level is Doctorate   Caffeine consumption is rarely, drinks decaf    Review of Systems: As per HPI, all others negative.  Physical Exam: Vital signs in last 24 hours: Temp:  [98 F (36.7 C)-98.6 F (37 C)] 98 F (36.7 C) (07/15 0451) Pulse Rate:  [80-113] 89 (07/15 0451) Resp:  [12-20] 18 (07/15 0451) BP:  (118-154)/(66-87) 128/66 mmHg (07/15 0451) SpO2:  [95 %-99 %] 99 % (07/15 0451) Weight:  [74.844 kg (165 lb)-77.61 kg (171 lb 1.6 oz)] 77.61 kg (171 lb 1.6  oz) (07/15 0451) Last BM Date: 09/06/13 General:   Alert,  Well-developed, well-nourished, pleasant and cooperative in NAD Head:  Normocephalic and atraumatic. Eyes:  Sclera clear, no icterus.   Conjunctiva pink. Ears:  Normal auditory acuity. Nose:  No deformity, discharge,  or lesions. Mouth:  No deformity or lesions.  Oropharynx pink & moist. Neck:  Supple; no masses or thyromegaly. Lungs:  Clear throughout to auscultation.   No wheezes, crackles, or rhonchi. No acute distress. Heart:  Regular rate and rhythm; no murmurs, clicks, rubs,  or gallops. Abdomen:  Soft, mild distention with mild tympany; No masses, hepatosplenomegaly or hernias noted. Normal bowel sounds, no peritonitis   Msk:  Symmetrical without gross deformities. Normal posture. Pulses:  Normal pulses noted. Extremities:  Without clubbing or edema. Neurologic:  Alert and  oriented x4;  grossly normal neurologically. Skin:  Intact without significant lesions or rashes. Psych:  Alert and cooperative. Normal mood and affect.  Lab Results:  Recent Labs  09/06/13 1739 09/07/13 0347  WBC 8.9 9.3  HGB 12.5 11.2*  HCT 39.7 35.8*  PLT 198 189   BMET  Recent Labs  09/06/13 1739 09/07/13 0347  NA 136* 138  K 4.4 3.9  CL 98 102  CO2 22 19  GLUCOSE 176* 147*  BUN 12 9  CREATININE 0.56 0.60  CALCIUM 9.3 8.7   LFT  Recent Labs  09/07/13 0347  PROT 6.2  ALBUMIN 3.5  AST 41*  ALT 31  ALKPHOS 53  BILITOT 0.4   PT/INR No results found for this basename: LABPROT, INR,  in the last 72 hours  Studies/Results: Ct Abdomen Pelvis W Contrast  09/06/2013   CLINICAL DATA:  Upper abdominal pain  EXAM: CT ABDOMEN AND PELVIS WITH CONTRAST  TECHNIQUE: Multidetector CT imaging of the abdomen and pelvis was performed using the standard protocol following bolus  administration of intravenous contrast.  CONTRAST:  55mL OMNIPAQUE IOHEXOL 300 MG/ML  SOLN  COMPARISON:  None.  FINDINGS: Lung bases are free of acute infiltrate or sizable effusion.  The liver is diffusely fatty infiltrated. The gallbladder has been surgically removed. The spleen, adrenal glands and pancreas are within normal limits. The kidneys are well visualized and demonstrates some cystic changes bilaterally. No renal calculi or obstructive changes are seen.  Small bowel dilatation is noted in the mid jejunum and distal ileum although no definitive obstructive changes seen. There are postsurgical changes in the right lower quadrant consistent with prior small bowel resections although they do not appear to be obstructive in nature. No definitive transition zone is seen.  Bladder is well distended. No free pelvic fluid is seen. No all sidewall abnormality is noted. The osseous structures are within normal limits.  IMPRESSION: Small bowel dilatation without definitive transition zone.  Chronic changes without acute abnormality.   Electronically Signed   By: Inez Catalina M.D.   On: 09/06/2013 20:14   Impression:  1.  Abdominal pain.  Suspect partial small bowel obstruction.  Based on negative sed rate, crp and lack of inflammatory changes on CT scan, I suspect this is likely adhesion-related.  Doubt active Crohn's disease at the present time.  Plan:  1.  Downgrade diet, pending clinical improvement, to sips of ice chips only. 2.  Intravenous fluids and antiemetics as needed. 3.  Follow clinically; if worsening distention and/or nausea/vomiting tomorrow, consider NGT placement and consider suppositories; if improvement in distention and/or passage of increasing amounts of stool/flatus, consider advancement of diet. 4.  Doubt clinical  course will be significantly affected, one way or the other, by antibiotics or steroids.  We can continue them for now, on the off-chance that she has subtle small bowel  inflammation not seen on CT, but I don't see any need for a protracted course of these agents. 5.  Will follow.   LOS: 1 day   Jaelen Soth M  09/07/2013, 2:23 PM

## 2013-09-07 NOTE — Progress Notes (Signed)
Utilization review completed.  

## 2013-09-08 LAB — BASIC METABOLIC PANEL
Anion gap: 14 (ref 5–15)
BUN: 8 mg/dL (ref 6–23)
CO2: 24 mEq/L (ref 19–32)
CREATININE: 0.64 mg/dL (ref 0.50–1.10)
Calcium: 8.3 mg/dL — ABNORMAL LOW (ref 8.4–10.5)
Chloride: 105 mEq/L (ref 96–112)
GFR calc Af Amer: >90 mL/min (ref >90)
GLUCOSE: 175 mg/dL — AB (ref 70–99)
Potassium: 3.9 mEq/L (ref 3.7–5.3)
Sodium: 143 mEq/L (ref 137–147)

## 2013-09-08 LAB — CBC
HCT: 34.1 % — ABNORMAL LOW (ref 36.0–46.0)
Hemoglobin: 10.5 g/dL — ABNORMAL LOW (ref 12.0–15.0)
MCH: 24.8 pg — AB (ref 26.0–34.0)
MCHC: 30.8 g/dL (ref 30.0–36.0)
MCV: 80.4 fL (ref 78.0–100.0)
PLATELETS: 184 10*3/uL (ref 150–400)
RBC: 4.24 MIL/uL (ref 3.87–5.11)
RDW: 15.7 % — AB (ref 11.5–15.5)
WBC: 6.4 10*3/uL (ref 4.0–10.5)

## 2013-09-08 LAB — GLUCOSE, CAPILLARY
GLUCOSE-CAPILLARY: 173 mg/dL — AB (ref 70–99)
GLUCOSE-CAPILLARY: 137 mg/dL — AB (ref 70–99)
GLUCOSE-CAPILLARY: 174 mg/dL — AB (ref 70–99)
Glucose-Capillary: 163 mg/dL — ABNORMAL HIGH (ref 70–99)
Glucose-Capillary: 237 mg/dL — ABNORMAL HIGH (ref 70–99)
Glucose-Capillary: 146 mg/dL — ABNORMAL HIGH (ref 70–99)

## 2013-09-08 LAB — CLOSTRIDIUM DIFFICILE BY PCR: Toxigenic C. Difficile by PCR: NEGATIVE

## 2013-09-08 MED ORDER — INSULIN ASPART 100 UNIT/ML ~~LOC~~ SOLN
0.0000 [IU] | Freq: Three times a day (TID) | SUBCUTANEOUS | Status: DC
  Administered 2013-09-09: 2 [IU] via SUBCUTANEOUS
  Administered 2013-09-09 (×2): 3 [IU] via SUBCUTANEOUS
  Administered 2013-09-10: 2 [IU] via SUBCUTANEOUS

## 2013-09-08 NOTE — Progress Notes (Signed)
Subjective: Had a bowel movement and lots more flatus over the past 24 hours. Overall feels much better. No vomiting.  Objective: Vital signs in last 24 hours: Temp:  [98.2 F (36.8 C)-98.4 F (36.9 C)] 98.3 F (36.8 C) (07/16 0558) Pulse Rate:  [73-80] 74 (07/16 0558) Resp:  [16] 16 (07/16 0558) BP: (97-126)/(64-81) 126/81 mmHg (07/16 0558) SpO2:  [92 %-99 %] 92 % (07/16 0558) Weight:  [77.61 kg (171 lb 1.6 oz)] 77.61 kg (171 lb 1.6 oz) (07/16 0558) Weight change: 2.767 kg (6 lb 1.6 oz) Last BM Date: 09/08/13  PE: GEN:  NAD ABD:  Less distended, minimal tympany, active bowel sounds, mild tenderness, no peritonitis  Lab Results: CBC    Component Value Date/Time   WBC 6.4 09/08/2013 0718   RBC 4.24 09/08/2013 0718   HGB 10.5* 09/08/2013 0718   HCT 34.1* 09/08/2013 0718   PLT 184 09/08/2013 0718   MCV 80.4 09/08/2013 0718   MCH 24.8* 09/08/2013 0718   MCHC 30.8 09/08/2013 0718   RDW 15.7* 09/08/2013 0718   LYMPHSABS 2.3 09/07/2013 0347   MONOABS 0.8 09/07/2013 0347   EOSABS 0.2 09/07/2013 0347   BASOSABS 0.0 09/07/2013 0347   Assessment:  1.  Partial small bowel obstruction, improving, likely adhesion-related. 2.  Small bowel Crohn's disease.  Doubt active Crohn's disease.  Plan:  1.  Advance diet (full liquid now, advance as tolerated. 2.  D/C entocort and antibiotics. 3.  Hopefully home tomorrow, if she tolerates diet and continues to pass flatus and stool. 4.  Will follow.   Landry Dyke 09/08/2013, 12:56 PM

## 2013-09-08 NOTE — Care Management Note (Signed)
    Page 1 of 1   09/09/2013     10:45:54 AM CARE MANAGEMENT NOTE 09/09/2013  Patient:  [name], [name]   Account Number:  [id number]  Date Initiated:  09/08/2013  Documentation initiated by:  Tomi Bamberger  Subjective/Objective Assessment:   dx bowel obstruction  admit lives with spouse.     Action/Plan:   Anticipated DC Date:  09/09/2013   Anticipated DC Plan:  [place]  CM consult      Choice offered to / List presented to:             Status of service:  Completed, signed off Medicare Important Message given?  YES (If response is "NO", the following Medicare IM given date fields will be blank) Date Medicare IM given:  09/09/2013 Medicare IM given by:  Tomi Bamberger Date Additional Medicare IM given:   Additional Medicare IM given by:    Discharge Disposition:  HOME/SELF CARE  Per UR Regulation:  Reviewed for med. necessity/level of care/duration of stay  If discussed at [place] of Stay Meetings, dates discussed:    Comments:

## 2013-09-08 NOTE — Progress Notes (Signed)
[facility] PROGRESS NOTE  [name] ZCH:[id number] DOB: 1950/10/04 DOA: 09/06/2013 PCP: [name], MD  Assessment/Plan: 1. ? Partial SBO- Resolved, GI has now advanced her diet.She is off antibiotics, also off Entocort. If tolerates diet, possible d/c home in am. 2.  Crohn's disease- stable. 3. Diabetes mellitus- SSI.  Code Status: Full code Family Communication: *No family at bedside Disposition Plan: Home when stable   Consultants:  GI  Procedures:  None  Antibiotics:  Cipro 7/15- 7/15  Flagyl 7/15-7/15  HPI/Subjective: Patient seen and examined, admitted with abdominal pain. Seen by GI, made npo for possible partial SBO. Now she has had BM and passing flatus.  Objective: Filed Vitals:   09/08/13 0558  BP: 126/81  Pulse: 74  Temp: 98.3 F (36.8 C)  Resp: 16    Intake/Output Summary (Last 24 hours) at 09/08/13 1403 Last data filed at 09/08/13 0900  Gross per 24 hour  Intake    615 ml  Output    500 ml  Net    115 ml   Filed Weights   09/07/13 0142 09/07/13 0451 09/08/13 0558  Weight: 77.61 kg (171 lb 1.6 oz) 77.61 kg (171 lb 1.6 oz) 77.61 kg (171 lb 1.6 oz)    Exam:   General:  Appear in no acute distress  Cardiovascular: S1S2 RRR  Respiratory: Clear bilaterally  Abdomen: Soft, mild generalized tenderness on palpation  Musculoskeletal: No edema  Data Reviewed: Basic Metabolic Panel:  Recent Labs Lab 09/06/13 1739 09/07/13 0347 09/08/13 0718  NA 136* 138 143  K 4.4 3.9 3.9  CL 98 102 105  CO2 22 19 24   GLUCOSE 176* 147* 175*  BUN 12 9 8   CREATININE 0.56 0.60 0.64  CALCIUM 9.3 8.7 8.3*   Liver Function Tests:  Recent Labs Lab 09/06/13 1739 09/07/13 0347  AST 40* 41*  ALT 29 31  ALKPHOS 57 53  BILITOT 0.5 0.4  PROT 7.7 6.2  ALBUMIN 4.3 3.5    Recent Labs Lab 09/06/13 1739  LIPASE 45   No results found for this basename: AMMONIA,  in the last 168 hours CBC:  Recent Labs Lab 09/06/13 1739  09/07/13 0347 09/08/13 0718  WBC 8.9 9.3 6.4  NEUTROABS 7.0 6.1  --   HGB 12.5 11.2* 10.5*  HCT 39.7 35.8* 34.1*  MCV 80.9 79.2 80.4  PLT 198 189 184   Cardiac Enzymes: No results found for this basename: CKTOTAL, CKMB, CKMBINDEX, TROPONINI,  in the last 168 hours BNP (last 3 results) No results found for this basename: PROBNP,  in the last 8760 hours CBG:  Recent Labs Lab 09/07/13 1713 09/07/13 2200 09/08/13 0605 09/08/13 0756 09/08/13 1226  GLUCAP 155* 112* 163* 173* 137*    Recent Results (from the past 240 hour(s))  CLOSTRIDIUM DIFFICILE BY PCR     Status: None   Collection Time    09/08/13  6:55 AM      Result Value Ref Range Status   C difficile by pcr NEGATIVE  NEGATIVE Final     Studies: Ct Abdomen Pelvis W Contrast  09/06/2013   CLINICAL DATA:  Upper abdominal pain  EXAM: CT ABDOMEN AND PELVIS WITH CONTRAST  TECHNIQUE: Multidetector CT imaging of the abdomen and pelvis was performed using the standard protocol following bolus administration of intravenous contrast.  CONTRAST:  57m OMNIPAQUE IOHEXOL 300 MG/ML  SOLN  COMPARISON:  None.  FINDINGS: Lung bases are free of acute infiltrate or sizable effusion.  The liver is diffusely fatty  infiltrated. The gallbladder has been surgically removed. The spleen, adrenal glands and pancreas are within normal limits. The kidneys are well visualized and demonstrates some cystic changes bilaterally. No renal calculi or obstructive changes are seen.  Small bowel dilatation is noted in the mid jejunum and distal ileum although no definitive obstructive changes seen. There are postsurgical changes in the right lower quadrant consistent with prior small bowel resections although they do not appear to be obstructive in nature. No definitive transition zone is seen.  Bladder is well distended. No free pelvic fluid is seen. No all sidewall abnormality is noted. The osseous structures are within normal limits.  IMPRESSION: Small bowel  dilatation without definitive transition zone.  Chronic changes without acute abnormality.   Electronically Signed   By: Inez Catalina M.D.   On: 09/06/2013 20:14    Scheduled Meds: . aspirin  81 mg Oral Daily  . heparin  5,000 Units Subcutaneous 3 times per day  . insulin aspart  0-9 Units Subcutaneous TID AC & HS  . levothyroxine  125 mcg Oral QAC breakfast  . pantoprazole  40 mg Oral QAC breakfast  . simvastatin  10 mg Oral q1800  . tiZANidine  1 mg Oral Q breakfast  . tiZANidine  1 mg Oral QAC lunch  . tiZANidine  4 mg Oral QHS   Continuous Infusions: . sodium chloride 75 mL/hr at 09/07/13 2345    Principal Problem:   Crohn's colitis Active Problems:   Type II or unspecified type diabetes mellitus without mention of complication, not stated as uncontrolled   Unspecified hypothyroidism    Time spent: 25 min    [facility]  [facility] Pager [phone]*. If 7PM-7AM, please contact night-coverage at [email or web address], password [facility] 09/08/2013, 2:03 PM  LOS: 2 days

## 2013-09-08 NOTE — Progress Notes (Signed)
RN assessed and could not gain IV access on patient. IV team called and attempted to obtain peripheral IV access. MD notified. Orders received from Dr. Paulita Fujita okay for patient to not have IV access.

## 2013-09-08 NOTE — Progress Notes (Signed)
Chaplain Note: Chaplain responded to HCP's recommendation to see patient for spiritual care. HCP reported that patient had previously experienced some aggravation. Upon entering the room, patient was sitting up in her bed and working on her tablet. Patient seemed receptive to the visit, but seemed a little embarrassed, asking if nurses had sent a chaplain to calm her down. 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 Patient reported it being difficult for her to receive care since she has always been the one giving care to others. 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 She also said that she'd been through the process of this none->liquid diet, so the combination of those made her a bit frustrated. Patient's overall demeanor was pleasant and relaxed, with subtle signs of anxiety.   Sheryn Bison  09/08/13 1500  Clinical Encounter Type  Visited With Patient;Health care provider  Visit Type Initial;Spiritual support;Social support  Referral From Nurse  Spiritual Encounters  Spiritual Needs Emotional

## 2013-09-09 LAB — GI PATHOGEN PANEL BY PCR, STOOL
C difficile toxin A/B: POSITIVE
Campylobacter by PCR: NEGATIVE
Cryptosporidium by PCR: NEGATIVE
E COLI (STEC): NEGATIVE
E coli (ETEC) LT/ST: NEGATIVE
E coli 0157 by PCR: NEGATIVE
G LAMBLIA BY PCR: NEGATIVE
NOROVIRUS G1/G2: NEGATIVE
ROTAVIRUS A BY PCR: NEGATIVE
SALMONELLA BY PCR: NEGATIVE
SHIGELLA BY PCR: NEGATIVE

## 2013-09-09 LAB — GLUCOSE, CAPILLARY
GLUCOSE-CAPILLARY: 171 mg/dL — AB (ref 70–99)
GLUCOSE-CAPILLARY: 248 mg/dL — AB (ref 70–99)
Glucose-Capillary: 219 mg/dL — ABNORMAL HIGH (ref 70–99)
Glucose-Capillary: 171 mg/dL — ABNORMAL HIGH (ref 70–99)

## 2013-09-09 NOTE — Progress Notes (Signed)
[facility] PROGRESS NOTE  [name] JHE:[id number] DOB: Dec 24, 1950 DOA: 09/06/2013 PCP: [name], MD  Assessment/Plan: 1. ? Partial SBO- Resolved, GI has now advanced her diet.She is off antibiotics, also off Entocort. If tolerates diet, possible d/c home in am. 2.  Crohn's disease- stable. 3. Diabetes mellitus- SSI.  Code Status: Full code Family Communication: *No family at bedside Disposition Plan: Home when stable   Consultants:  GI  Procedures:  None  Antibiotics:  Cipro 7/15- 7/15  Flagyl 7/15-7/15  HPI/Subjective: Patient seen and examined, admitted with abdominal pain. Seen by GI, made npo for possible partial SBO. Now she has had BM and passing flatus.  Objective: Filed Vitals:   09/09/13 1252  BP: 124/76  Pulse: 75  Temp: 98.2 F (36.8 C)  Resp: 16    Intake/Output Summary (Last 24 hours) at 09/09/13 1346 Last data filed at 09/09/13 0932  Gross per 24 hour  Intake   1132 ml  Output     15 ml  Net   1117 ml   Filed Weights   09/07/13 0451 09/08/13 0558 09/09/13 0500  Weight: 77.61 kg (171 lb 1.6 oz) 77.61 kg (171 lb 1.6 oz) 76.839 kg (169 lb 6.4 oz)    Exam:   General:  Appear in no acute distress  Cardiovascular: S1S2 RRR  Respiratory: Clear bilaterally  Abdomen: Soft, mild generalized tenderness on palpation  Musculoskeletal: No edema  Data Reviewed: Basic Metabolic Panel:  Recent Labs Lab 09/06/13 1739 09/07/13 0347 09/08/13 0718  NA 136* 138 143  K 4.4 3.9 3.9  CL 98 102 105  CO2 22 19 24   GLUCOSE 176* 147* 175*  BUN 12 9 8   CREATININE 0.56 0.60 0.64  CALCIUM 9.3 8.7 8.3*   Liver Function Tests:  Recent Labs Lab 09/06/13 1739 09/07/13 0347  AST 40* 41*  ALT 29 31  ALKPHOS 57 53  BILITOT 0.5 0.4  PROT 7.7 6.2  ALBUMIN 4.3 3.5    Recent Labs Lab 09/06/13 1739  LIPASE 45   No results found for this basename: AMMONIA,  in the last 168 hours CBC:  Recent Labs Lab 09/06/13 1739  09/07/13 0347 09/08/13 0718  WBC 8.9 9.3 6.4  NEUTROABS 7.0 6.1  --   HGB 12.5 11.2* 10.5*  HCT 39.7 35.8* 34.1*  MCV 80.9 79.2 80.4  PLT 198 189 184   Cardiac Enzymes: No results found for this basename: CKTOTAL, CKMB, CKMBINDEX, TROPONINI,  in the last 168 hours BNP (last 3 results) No results found for this basename: PROBNP,  in the last 8760 hours CBG:  Recent Labs Lab 09/08/13 1650 09/08/13 1838 09/08/13 2125 09/09/13 0808 09/09/13 1218  GLUCAP 237* 174* 146* 171* 248*    Recent Results (from the past 240 hour(s))  CLOSTRIDIUM DIFFICILE BY PCR     Status: None   Collection Time    09/08/13  6:55 AM      Result Value Ref Range Status   C difficile by pcr NEGATIVE  NEGATIVE Final     Studies: No results found.  Scheduled Meds: . aspirin  81 mg Oral Daily  . heparin  5,000 Units Subcutaneous 3 times per day  . insulin aspart  0-9 Units Subcutaneous TID WC  . levothyroxine  125 mcg Oral QAC breakfast  . pantoprazole  40 mg Oral QAC breakfast  . simvastatin  10 mg Oral q1800  . tiZANidine  1 mg Oral Q breakfast  . tiZANidine  1 mg Oral QAC lunch  .  tiZANidine  4 mg Oral QHS   Continuous Infusions:    Principal Problem:   Crohn's colitis Active Problems:   Type II or unspecified type diabetes mellitus without mention of complication, not stated as uncontrolled   Unspecified hypothyroidism    Time spent: 25 min    [facility]  [facility] Pager [phone]*. If 7PM-7AM, please contact night-coverage at [email or web address], password [facility] 09/09/2013, 1:46 PM  LOS: 3 days

## 2013-09-09 NOTE — Progress Notes (Signed)
#  1.Partial small bowel obstruction. Patient is tolerating a full liquid diet. Regular diet ordered.

## 2013-09-10 LAB — GLUCOSE, CAPILLARY: Glucose-Capillary: 184 mg/dL — ABNORMAL HIGH (ref 70–99)

## 2013-09-10 NOTE — Discharge Summary (Signed)
Physician Discharge Summary  [name] NWG:[id number] DOB: 04-12-1950 DOA: 09/06/2013  PCP: Danielle Fair, MD  Admit date: 09/06/2013 Discharge date: 09/10/2013  Time spent: 50* minutes  Recommendations for Outpatient Follow-up:  1. *Follow up PCP in 2 weeks  Discharge Diagnoses:  Principal Problem:   Crohn's colitis Active Problems:   Type II or unspecified type diabetes mellitus without mention of complication, not stated as uncontrolled   Unspecified hypothyroidism   Discharge Condition: Stable  Diet recommendation: Regular diet  Filed Weights   09/08/13 0558 09/09/13 0500 09/10/13 0430  Weight: 77.61 kg (171 lb 1.6 oz) 76.839 kg (169 lb 6.4 oz) 76 kg (167 lb 8.8 oz)    History of present illness:  63 y.o. female with Past medical history of Crohn's colitis status post resection, diabetes, GERD, hypertension, stroke, depression.  Patient presented with complaints of abdominal pain that has been ongoing since earlier this morning. The pain is located diffusely and is crampy in nature. She also had episodes of nausea and dry heaving this. She denies any fever or chills. She denies any diarrhea now that she has constipation. She denies any burning urination. She denies any active bleeding.  She is not on any medications for Crohn's as her Crohn's is fairly controlled. She mentions she has chronic diarrhea which she uses colestipol for which causes her to have chronic constipation. She mentions she has once or twice a year for that her up for Crohn's disease.  The patient is coming from home. And at her baseline independent for most of her ADL   Hospital Course:  ? Partial SBO-  CT scan abdomen showed possible partial SBO, Gi was consulted.Resolved, GI has now advanced her diet.She is off antibiotics, also off Entocort. Tolerating diet, called and discussed with GI on call Dr Michail Sermon, ok for discharge  home .  Crohn's disease- stable.  Diabetes mellitus- Continue oral  hypoglycemics.   Procedures:  *None  Consultations:  Gastroenterology   Discharge Exam: Filed Vitals:   09/10/13 0430  BP: 121/78  Pulse: 72  Temp: 98.1 F (36.7 C)  Resp: 18    General: Appear in no acute distress Cardiovascular: *S1s2 RRR Respiratory: Clear bilaterally  Discharge Instructions You were cared for by a hospitalist during your hospital stay. If you have any questions about your discharge medications or the care you received while you were in the hospital after you are discharged, you can call the unit and asked to speak with the hospitalist on call if the hospitalist that took care of you is not available. Once you are discharged, your primary care physician will handle any further medical issues. Please note that NO REFILLS for any discharge medications will be authorized once you are discharged, as it is imperative that you return to your primary care physician (or establish a relationship with a primary care physician if you do not have one) for your aftercare needs so that they can reassess your need for medications and monitor your lab values.  Discharge Instructions   Diet - low sodium heart healthy    Complete by:  As directed      Discharge instructions    Complete by:  As directed   Can resume outpatient PT/OT     Increase activity slowly    Complete by:  As directed             Medication List         aspirin 81 MG tablet  Take 81 mg by  mouth daily.     JANUVIA 100 MG tablet  Generic drug:  sitaGLIPtin  Take 100 mg by mouth Daily.     levothyroxine 125 MCG tablet  Commonly known as:  SYNTHROID, LEVOTHROID  Take 125 mcg by mouth Daily.     MICRONIZED COLESTIPOL HCL 1 G tablet  Generic drug:  colestipol  Take 1 g by mouth daily.     pantoprazole 40 MG tablet  Commonly known as:  PROTONIX  Take 40 mg by mouth daily.     ramipril 2.5 MG capsule  Commonly known as:  ALTACE  Take 2.5 mg by mouth daily.     repaglinide 0.5 MG tablet   Commonly known as:  PRANDIN  Take 0.5 mg by mouth 2 (two) times daily before a meal.     simvastatin 10 MG tablet  Commonly known as:  ZOCOR  Take 10 mg by mouth Daily.     tiZANidine 2 MG tablet  Commonly known as:  ZANAFLEX  Take 2-4 mg by mouth every 6 (six) hours as needed for muscle spasms. Take 15m every morning, 1102mat noon, and 6m78mt bedtime.       Allergies  Allergen Reactions  . Codeine Other (See Comments)    Syncope.  . Morphine And Related Nausea And Vomiting and Other (See Comments)    Hallucinations.  . Baclofen Nausea Only and Other (See Comments)    dizziness  . Sulfur Itching    Per patient happened years ago.       Follow-up Information   Follow up with JOHGarlan FairD In 1 week.   Specialty:  Gastroenterology   Contact information:   [address] [place] [phone]     The results of significant diagnostics from this hospitalization (including imaging, microbiology, ancillary and laboratory) are listed below for reference.    Significant Diagnostic Studies: Ct Abdomen Pelvis W Contrast  09/06/2013   CLINICAL DATA:  Upper abdominal pain  EXAM: CT ABDOMEN AND PELVIS WITH CONTRAST  TECHNIQUE: Multidetector CT imaging of the abdomen and pelvis was performed using the standard protocol following bolus administration of intravenous contrast.  CONTRAST:  25m38mNIPAQUE IOHEXOL 300 MG/ML  SOLN  COMPARISON:  None.  FINDINGS: Lung bases are free of acute infiltrate or sizable effusion.  The liver is diffusely fatty infiltrated. The gallbladder has been surgically removed. The spleen, adrenal glands and pancreas are within normal limits. The kidneys are well visualized and demonstrates some cystic changes bilaterally. No renal calculi or obstructive changes are seen.  Small bowel dilatation is noted in the mid jejunum and distal ileum although no definitive obstructive changes seen. There are postsurgical changes in the  right lower quadrant consistent with prior small bowel resections although they do not appear to be obstructive in nature. No definitive transition zone is seen.  Bladder is well distended. No free pelvic fluid is seen. No all sidewall abnormality is noted. The osseous structures are within normal limits.  IMPRESSION: Small bowel dilatation without definitive transition zone.  Chronic changes without acute abnormality.   Electronically Signed   By: MarkInez Catalina.   On: 09/06/2013 20:14    Microbiology: Recent Results (from the past 240 hour(s))  CLOSTRIDIUM DIFFICILE BY PCR     Status: None   Collection Time    09/08/13  6:55 AM      Result Value Ref Range Status   C difficile by pcr NEGATIVE  NEGATIVE Final     Labs: Basic Metabolic Panel:  Recent Labs Lab 09/06/13 1739 09/07/13 0347 09/08/13 0718  NA 136* 138 143  K 4.4 3.9 3.9  CL 98 102 105  CO2 22 19 24   GLUCOSE 176* 147* 175*  BUN 12 9 8   CREATININE 0.56 0.60 0.64  CALCIUM 9.3 8.7 8.3*   Liver Function Tests:  Recent Labs Lab 09/06/13 1739 09/07/13 0347  AST 40* 41*  ALT 29 31  ALKPHOS 57 53  BILITOT 0.5 0.4  PROT 7.7 6.2  ALBUMIN 4.3 3.5    Recent Labs Lab 09/06/13 1739  LIPASE 45   No results found for this basename: AMMONIA,  in the last 168 hours CBC:  Recent Labs Lab 09/06/13 1739 09/07/13 0347 09/08/13 0718  WBC 8.9 9.3 6.4  NEUTROABS 7.0 6.1  --   HGB 12.5 11.2* 10.5*  HCT 39.7 35.8* 34.1*  MCV 80.9 79.2 80.4  PLT 198 189 184   Cardiac Enzymes: No results found for this basename: CKTOTAL, CKMB, CKMBINDEX, TROPONINI,  in the last 168 hours BNP: BNP (last 3 results) No results found for this basename: PROBNP,  in the last 8760 hours CBG:  Recent Labs Lab 09/09/13 0808 09/09/13 1218 09/09/13 1716 09/09/13 2134 09/10/13 0739  GLUCAP 171* 248* 219* 171* 184*       Signed:  Philena Obey S  [facility] 09/10/2013, 10:39 AM

## 2013-09-10 NOTE — Progress Notes (Signed)
09/10/13 Patient discharged today home. No IV access to remove. Discharge instructions reviewed with patient.

## 2013-09-12 NOTE — ED Provider Notes (Signed)
I saw and evaluated the patient, reviewed the resident's note and I agree with the findings and plan.   EKG Interpretation None      PT with abd pain. Hx of crohn's disease.  CT ordered and showed some non specific inflammation around small bowel. She is dehydrated per UA showing ketones. Failed po challenge - due to nausea and worsening pain. Admitted.  Varney Biles, MD 09/12/13 [phone]

## 2013-09-15 ENCOUNTER — Encounter: Payer: Self-pay | Admitting: Neurology

## 2013-09-15 ENCOUNTER — Telehealth: Payer: Self-pay | Admitting: Neurology

## 2013-09-15 NOTE — Telephone Encounter (Signed)
Patient stated [facility], requesting a note from Dr. Janann Colonel stated pt may resume PT and OT after hospitalization.  She has appointment scheduled Monday and would like to have note faxed ASAP.  Please call and advise.

## 2013-09-15 NOTE — Telephone Encounter (Signed)
Called patient and lt VM message to return call per Dr Janann Colonel

## 2013-09-15 NOTE — Telephone Encounter (Signed)
Per the notes she was admitted for a partial small bowel obstruction, not for a neurological issue. She should get cleared by either GI or her primary care doctor.

## 2013-09-16 ENCOUNTER — Other Ambulatory Visit: Payer: Self-pay | Admitting: Neurology

## 2013-09-16 ENCOUNTER — Encounter: Payer: Self-pay | Admitting: Neurology

## 2013-09-16 DIAGNOSIS — R2681 Unsteadiness on feet: Secondary | ICD-10-CM

## 2013-09-16 NOTE — Telephone Encounter (Signed)
Shared message with patient per Dr Janann Colonel, and patient said that her PCP/GI physician  said that her Neurologist should give her the go ahead to resume her PT /OT since he had given the original order,was taking the therapy up until she went to hospital. Patient is also requesting an prescription for AFO(metal brace) for her new shoes. Her pcp said that her Neurologist should write that as well.

## 2013-09-16 NOTE — Telephone Encounter (Signed)
Letter faxed and order for AFO placed

## 2013-09-16 NOTE — Telephone Encounter (Signed)
Patient is returning a call. °

## 2013-09-16 NOTE — Telephone Encounter (Signed)
Faxed order to [facility] , received confirmation

## 2013-09-30 ENCOUNTER — Ambulatory Visit: Payer: Medicare Other | Admitting: Rehabilitative and Restorative Service Providers"

## 2013-10-07 ENCOUNTER — Ambulatory Visit: Payer: Medicare Other | Admitting: Physical Therapy

## 2013-11-11 ENCOUNTER — Encounter: Payer: Self-pay | Admitting: Neurology

## 2013-11-11 ENCOUNTER — Ambulatory Visit (INDEPENDENT_AMBULATORY_CARE_PROVIDER_SITE_OTHER): Payer: Medicare Other | Admitting: Neurology

## 2013-11-11 VITALS — BP 112/74 | HR 84 | Ht 64.0 in | Wt 174.0 lb

## 2013-11-11 DIAGNOSIS — G8114 Spastic hemiplegia affecting left nondominant side: Secondary | ICD-10-CM

## 2013-11-11 DIAGNOSIS — G8110 Spastic hemiplegia affecting unspecified side: Secondary | ICD-10-CM

## 2013-11-11 MED ORDER — ONABOTULINUMTOXINA 100 UNITS IJ SOLR: 300.0000 [IU] | Freq: Once | INTRAMUSCULAR | Status: DC

## 2013-11-11 NOTE — Progress Notes (Signed)
[facility]   Provider:  Dr Janann Colonel Referring Provider: [name], MD Primary Care Physician:  [name], MD  CC:  Spastic hemiplegia  HPI:  [name] is a 63 y.o. female here for follow up botox injections for spastic hemiplegia.   Presents today for follow up Botox injections. Last injections were 08/12/2013. Since last visit was admitted for SBO. Stable since discharge. Last injections went well. Tolerated well. No adverse effects.   Currently taking Zanaflex 2mg  tablet, (1/2 tab at 7am, 1/2 tab at 1pm and 2 tabs at 7pm) PE:  Flexion posture of LUE, pronated forearm. Inversion L ankle L pec major: 35 L biceps 60 (2 x 30) L pronator teres 30 L FDP 40 (2 x 20) L FDS 20 (2x 10)  L posterior tib: 40  Total 225  Contraindications and precautions discussed with patient. EMG guidance was used to inject muscles detailed below. Aseptic procedure was observed and patient tolerated procedure. Procedure performed by Dr. Jim Like.  The condition has existed for more than 6 months, and pt does not have a diagnosis of ALS, Myasthenia Gravis or Lambert-Eaton Syndrome.  Risks and benefits of injections discussed and pt agrees to proceed with the procedure.  Written consent obtained These injections are medically necessary. He receives good benefits from these injections. These injections do not cause sedations or hallucinations which the oral therapies may cause.  Indication/Diagnosis:spastic hemiplegia  Type of toxin:Botox   Lot #[id number]  Expiration date: March 2018    History   Social History  . Marital Status: Married    Spouse Name: [name]    Number of Children: 2  . 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 Smoking status: Former Smoker    Quit date: 04/16/1993  . Smokeless tobacco: Never Used  . Alcohol Use: No  . Drug Use: No  . Sexual Activity: Not on file    Other Topics Concern  . Not on file   Social History Narrative   Patient is married [name]), has 2 children   Patient is right handed   Education level is Doctorate   Caffeine consumption is rarely, drinks decaf    Family History  Problem Relation Age of Onset  . Heart disease Mother   . Heart disease Father   . Diabetes Brother   . Diabetes Brother   . Migraines Son     Past Medical History  Diagnosis Date  . Diabetes mellitus   . GERD (gastroesophageal reflux disease)   . Hyperlipidemia   . Hypertension   . Stroke   . Thyroid disease   . Visual disturbance     Patient has vision loss.  . Constipation   . Bruises easily   . Lump in female breast   . Hemiparesis and alteration of sensations as late effects of stroke 01/26/2013  . Abnormality of gait 01/26/2013  . Dizziness and giddiness 01/26/2013  . Crohn's disease   . Depression   . Panic disorder     Past Surgical History  Procedure Laterality Date  . Cesarean section  1981  . Cesarean section  1983  . Cholecystectomy  1986  . Small intestine surgery  05/1991  . Av fistula repair  2008  . Breast surgery      Current Outpatient Prescriptions  Medication Sig Dispense Refill  . aspirin 81 MG tablet Take 81 mg by mouth daily.      Marland Kitchen  JANUVIA 100 MG tablet Take 100 mg by mouth Daily.       Marland Kitchen levothyroxine (SYNTHROID, LEVOTHROID) 125 MCG tablet Take 125 mcg by mouth Daily.       . pantoprazole (PROTONIX) 40 MG tablet Take 40 mg by mouth daily.       . ramipril (ALTACE) 2.5 MG capsule Take 2.5 mg by mouth daily.       . repaglinide (PRANDIN) 0.5 MG tablet Take 0.5 mg by mouth 2 (two) times daily before a meal.       . simvastatin (ZOCOR) 10 MG tablet Take 10 mg by mouth Daily.       Marland Kitchen tiZANidine (ZANAFLEX) 2 MG tablet Take 2-4 mg by mouth every 6 (six) hours as needed for muscle spasms. Take 1mg  every morning, 1mg  at noon, and 4mg  at bedtime.      Marland Kitchen MICRONIZED COLESTIPOL HCL 1 G tablet Take 1 g by mouth daily.        Current Facility-Administered Medications  Medication Dose Route Frequency Provider Last Rate Last Dose  . botulinum toxin Type A (BOTOX) injection 200 Units  200 Units Intramuscular Once Hulen Luster, DO      . botulinum toxin Type A (BOTOX) injection 200 Units  200 Units Intramuscular Once Hulen Luster, DO        Allergies as of 11/11/2013 - Review Complete 11/11/2013  Allergen Reaction Noted  . Codeine Other (See Comments) 04/17/2011  . Morphine and related Nausea And Vomiting and Other (See Comments) 04/17/2011  . Baclofen Nausea Only and Other (See Comments) 01/26/2013  . Sulfur Itching 04/17/2011    Vitals: BP 112/74  Pulse 84  Ht 5\' 4"  (1.626 m)  Wt 174 lb (78.926 kg)  BMI 29.85 kg/m2 Last Weight:  Wt Readings from Last 1 Encounters:  11/11/13 174 lb (78.926 kg)   Last Height:   Ht Readings from Last 1 Encounters:  11/11/13 5\' 4"  (1.626 m)      Assessment:  After physical and neurologic examination, review of laboratory studies, imaging, neurophysiology testing and pre-existing records, assessment will be reviewed on the problem list.  Plan:  Treatment plan and additional workup will be reviewed under Problem List.  [name] is a 63 y.o. female here for botulinum toxin injections. They tolerated procedure well with no complications.   1) Botox injections as detailed above. A total of 225 units was used, will order 300 units for next visit 2) Tylenol or Motrin for injection site pain. 3) Medication guide dispensed. 4) Follow up for repeat injections in 3 months 5)Patient will notify us on decision of where she wishes to continue her Botox therapy   Jim Like, DO  [facility] [address] [place], [place] [address]  Phone [phone] Fax [phone]

## 2013-11-11 NOTE — Patient Instructions (Signed)
Overall you are doing fairly well but I do want to suggest a few things today:   Remember to drink plenty of fluid, eat healthy meals and do not skip any meals. Try to eat protein with a every meal and eat a healthy snack such as fruit or nuts in between meals. Try to keep a regular sleep-wake schedule and try to exercise daily, particularly in the form of walking, 20-30 minutes a day, if you can.   Please let us know where you would like to have further botox injections. If you decide to try outside of [facility] you can consider Salome Holmes, [facility] or Dr Letta Pate with [facility]  My clinical assistant and will answer any of your questions and relay your messages to me and also relay most of my messages to you.   Our phone number is [phone]. We also have an after hours call service for urgent matters and there is a physician on-call for urgent questions. For any emergencies you know to call 911 or go to the nearest emergency room

## 2013-11-15 ENCOUNTER — Other Ambulatory Visit: Payer: Self-pay | Admitting: Neurology

## 2013-11-15 ENCOUNTER — Telehealth: Payer: Self-pay | Admitting: *Deleted

## 2013-11-15 DIAGNOSIS — G8110 Spastic hemiplegia affecting unspecified side: Secondary | ICD-10-CM

## 2013-11-15 NOTE — Telephone Encounter (Signed)
Pt contacted Dr. Letta Pate for botox treatments, need referral to him, with notes. Call pt back after referral sent. [phone].

## 2013-11-15 NOTE — Telephone Encounter (Signed)
Please let her know a referral has been sent. Thanks.

## 2013-11-16 NOTE — Telephone Encounter (Signed)
I  LMVM that referral placed to Dr. Letta Pate.  Faxed referral and notes to [phone]. [facility].  [phone].  Relayed that referral and notes reviewed by clinic staff then decision made, if accepted they will call her with appt.

## 2013-12-06 ENCOUNTER — Emergency Department (HOSPITAL_COMMUNITY): Payer: Medicare Other

## 2013-12-06 ENCOUNTER — Inpatient Hospital Stay (HOSPITAL_COMMUNITY): Payer: Medicare Other

## 2013-12-06 ENCOUNTER — Encounter (HOSPITAL_COMMUNITY): Payer: Self-pay | Admitting: Emergency Medicine

## 2013-12-06 ENCOUNTER — Inpatient Hospital Stay (HOSPITAL_COMMUNITY)
Admission: EM | Admit: 2013-12-06 | Discharge: 2013-12-11 | DRG: 392 | Disposition: A | Discharge status: Home / Self Care | Payer: Medicare Other | Attending: Internal Medicine | Admitting: Internal Medicine

## 2013-12-06 DIAGNOSIS — E039 Hypothyroidism, unspecified: Secondary | ICD-10-CM | POA: Diagnosis present

## 2013-12-06 DIAGNOSIS — R269 Unspecified abnormalities of gait and mobility: Secondary | ICD-10-CM

## 2013-12-06 DIAGNOSIS — I69398 Other sequelae of cerebral infarction: Secondary | ICD-10-CM

## 2013-12-06 DIAGNOSIS — Z79899 Other long term (current) drug therapy: Secondary | ICD-10-CM

## 2013-12-06 DIAGNOSIS — K5090 Crohn's disease, unspecified, without complications: Secondary | ICD-10-CM | POA: Diagnosis present

## 2013-12-06 DIAGNOSIS — R1084 Generalized abdominal pain: Secondary | ICD-10-CM

## 2013-12-06 DIAGNOSIS — I10 Essential (primary) hypertension: Secondary | ICD-10-CM | POA: Diagnosis present

## 2013-12-06 DIAGNOSIS — F329 Major depressive disorder, single episode, unspecified: Secondary | ICD-10-CM | POA: Diagnosis present

## 2013-12-06 DIAGNOSIS — K5000 Crohn's disease of small intestine without complications: Secondary | ICD-10-CM | POA: Diagnosis present

## 2013-12-06 DIAGNOSIS — K50919 Crohn's disease, unspecified, with unspecified complications: Secondary | ICD-10-CM

## 2013-12-06 DIAGNOSIS — R197 Diarrhea, unspecified: Secondary | ICD-10-CM | POA: Diagnosis present

## 2013-12-06 DIAGNOSIS — E785 Hyperlipidemia, unspecified: Secondary | ICD-10-CM | POA: Diagnosis present

## 2013-12-06 DIAGNOSIS — E118 Type 2 diabetes mellitus with unspecified complications: Secondary | ICD-10-CM

## 2013-12-06 DIAGNOSIS — E119 Type 2 diabetes mellitus without complications: Secondary | ICD-10-CM

## 2013-12-06 DIAGNOSIS — K219 Gastro-esophageal reflux disease without esophagitis: Secondary | ICD-10-CM | POA: Diagnosis present

## 2013-12-06 DIAGNOSIS — Z87891 Personal history of nicotine dependence: Secondary | ICD-10-CM | POA: Diagnosis not present

## 2013-12-06 DIAGNOSIS — I69354 Hemiplegia and hemiparesis following cerebral infarction affecting left non-dominant side: Secondary | ICD-10-CM

## 2013-12-06 DIAGNOSIS — Z7982 Long term (current) use of aspirin: Secondary | ICD-10-CM

## 2013-12-06 DIAGNOSIS — I69359 Hemiplegia and hemiparesis following cerebral infarction affecting unspecified side: Secondary | ICD-10-CM

## 2013-12-06 DIAGNOSIS — R109 Unspecified abdominal pain: Secondary | ICD-10-CM

## 2013-12-06 DIAGNOSIS — K580 Irritable bowel syndrome with diarrhea: Secondary | ICD-10-CM | POA: Diagnosis present

## 2013-12-06 HISTORY — DX: Nausea with vomiting, unspecified: R11.2

## 2013-12-06 HISTORY — DX: Unspecified abdominal pain: R10.9

## 2013-12-06 HISTORY — DX: Hypothyroidism, unspecified: E03.9

## 2013-12-06 HISTORY — DX: Heteronymous bilateral field defects: H53.47

## 2013-12-06 HISTORY — DX: Other specified postprocedural states: Z98.890

## 2013-12-06 HISTORY — DX: Type 2 diabetes mellitus without complications: E11.9

## 2013-12-06 LAB — CBC WITH DIFFERENTIAL/PLATELET
BASOS PCT: 0 % (ref 0–1)
Basophils Absolute: 0 10*3/uL (ref 0.0–0.1)
EOS ABS: 0.2 10*3/uL (ref 0.0–0.7)
EOS PCT: 3 % (ref 0–5)
HEMATOCRIT: 37.8 % (ref 36.0–46.0)
Hemoglobin: 12.2 g/dL (ref 12.0–15.0)
Lymphocytes Relative: 34 % (ref 12–46)
Lymphs Abs: 2.4 10*3/uL (ref 0.7–4.0)
MCH: 25 pg — AB (ref 26.0–34.0)
MCHC: 32.3 g/dL (ref 30.0–36.0)
MCV: 77.5 fL — ABNORMAL LOW (ref 78.0–100.0)
MONO ABS: 0.5 10*3/uL (ref 0.1–1.0)
MONOS PCT: 7 % (ref 3–12)
Neutro Abs: 3.8 10*3/uL (ref 1.7–7.7)
Neutrophils Relative %: 56 % (ref 43–77)
Platelets: 226 10*3/uL (ref 150–400)
RBC: 4.88 MIL/uL (ref 3.87–5.11)
RDW: 15.9 % — ABNORMAL HIGH (ref 11.5–15.5)
WBC: 6.8 10*3/uL (ref 4.0–10.5)

## 2013-12-06 LAB — LIPASE, BLOOD: LIPASE: 38 U/L (ref 11–59)

## 2013-12-06 LAB — COMPREHENSIVE METABOLIC PANEL
ALT: 37 U/L — ABNORMAL HIGH (ref 0–35)
AST: 35 U/L (ref 0–37)
Albumin: 4 g/dL (ref 3.5–5.2)
Alkaline Phosphatase: 60 U/L (ref 39–117)
Anion gap: 13 (ref 5–15)
BILIRUBIN TOTAL: 0.6 mg/dL (ref 0.3–1.2)
BUN: 11 mg/dL (ref 6–23)
CHLORIDE: 103 meq/L (ref 96–112)
CO2: 24 mEq/L (ref 19–32)
Calcium: 9.3 mg/dL (ref 8.4–10.5)
Creatinine, Ser: 0.71 mg/dL (ref 0.50–1.10)
GFR calc Af Amer: >90 mL/min (ref >90)
GFR calc non Af Amer: >90 mL/min (ref >90)
Glucose, Bld: 119 mg/dL — ABNORMAL HIGH (ref 70–99)
Potassium: 4 mEq/L (ref 3.7–5.3)
Sodium: 140 mEq/L (ref 137–147)
TOTAL PROTEIN: 7.6 g/dL (ref 6.0–8.3)

## 2013-12-06 LAB — URINE MICROSCOPIC-ADD ON

## 2013-12-06 LAB — URINALYSIS, ROUTINE W REFLEX MICROSCOPIC
BILIRUBIN URINE: NEGATIVE
Glucose, UA: NEGATIVE mg/dL
Hgb urine dipstick: NEGATIVE
Ketones, ur: 15 mg/dL — AB
Nitrite: NEGATIVE
Protein, ur: NEGATIVE mg/dL
Specific Gravity, Urine: 1.018 (ref 1.005–1.030)
Urobilinogen, UA: 0.2 mg/dL (ref 0.0–1.0)
pH: 5.5 (ref 5.0–8.0)

## 2013-12-06 LAB — GLUCOSE, CAPILLARY: GLUCOSE-CAPILLARY: 99 mg/dL (ref 70–99)

## 2013-12-06 LAB — SEDIMENTATION RATE: Sed Rate: 5 mm/hr (ref 0–22)

## 2013-12-06 LAB — TSH: TSH: 2.35 u[IU]/mL (ref 0.350–4.500)

## 2013-12-06 MED ORDER — ONDANSETRON HCL 4 MG/2ML IJ SOLN: 4.0000 mg | Freq: Three times a day (TID) | INTRAMUSCULAR | Status: DC | PRN

## 2013-12-06 MED ORDER — ACETAMINOPHEN 325 MG PO TABS: 650.0000 mg | ORAL_TABLET | Freq: Four times a day (QID) | ORAL | Status: DC | PRN

## 2013-12-06 MED ORDER — ONDANSETRON HCL 4 MG PO TABS: 4.0000 mg | ORAL_TABLET | Freq: Four times a day (QID) | ORAL | Status: DC | PRN

## 2013-12-06 MED ORDER — ASPIRIN 81 MG PO TABS: 81.0000 mg | ORAL_TABLET | Freq: Every day | ORAL | Status: DC

## 2013-12-06 MED ORDER — ALUM & MAG HYDROXIDE-SIMETH 200-200-20 MG/5ML PO SUSP
30.0000 mL | Freq: Four times a day (QID) | ORAL | Status: DC | PRN
  Administered 2013-12-08: 30 mL via ORAL
  Filled 2013-12-06: qty 30

## 2013-12-06 MED ORDER — DICYCLOMINE HCL 10 MG PO CAPS
10.0000 mg | ORAL_CAPSULE | Freq: Three times a day (TID) | ORAL | Status: DC
  Administered 2013-12-06 – 2013-12-11 (×19): 10 mg via ORAL
  Filled 2013-12-06 (×22): qty 1

## 2013-12-06 MED ORDER — TIZANIDINE HCL 4 MG PO TABS
4.0000 mg | ORAL_TABLET | Freq: Every day | ORAL | Status: DC
  Administered 2013-12-06: 4 mg via ORAL
  Filled 2013-12-06 (×2): qty 1

## 2013-12-06 MED ORDER — IOHEXOL 300 MG/ML  SOLN
100.0000 mL | Freq: Once | INTRAMUSCULAR | Status: AC | PRN
  Administered 2013-12-06: 100 mL via INTRAVENOUS

## 2013-12-06 MED ORDER — FENTANYL CITRATE 0.05 MG/ML IJ SOLN: 25.0000 ug | INTRAMUSCULAR | Status: DC | PRN

## 2013-12-06 MED ORDER — ENOXAPARIN SODIUM 40 MG/0.4ML ~~LOC~~ SOLN
40.0000 mg | SUBCUTANEOUS | Status: DC
  Filled 2013-12-06 (×6): qty 0.4

## 2013-12-06 MED ORDER — RAMIPRIL 2.5 MG PO CAPS
2.5000 mg | ORAL_CAPSULE | Freq: Every day | ORAL | Status: DC
Start: 2013-12-07 — End: 2013-12-11
  Administered 2013-12-07 – 2013-12-11 (×5): 2.5 mg via ORAL
  Filled 2013-12-06 (×5): qty 1

## 2013-12-06 MED ORDER — DEXTROSE-NACL 5-0.45 % IV SOLN: INTRAVENOUS | Status: DC

## 2013-12-06 MED ORDER — SIMVASTATIN 10 MG PO TABS
10.0000 mg | ORAL_TABLET | Freq: Every day | ORAL | Status: DC
  Administered 2013-12-07 – 2013-12-10 (×4): 10 mg via ORAL
  Filled 2013-12-06 (×5): qty 1

## 2013-12-06 MED ORDER — SODIUM CHLORIDE 0.9 % IV BOLUS (SEPSIS)
1000.0000 mL | Freq: Once | INTRAVENOUS | Status: AC
  Administered 2013-12-06: 1000 mL via INTRAVENOUS

## 2013-12-06 MED ORDER — DICYCLOMINE HCL 10 MG/ML IM SOLN
20.0000 mg | Freq: Once | INTRAMUSCULAR | Status: AC
  Administered 2013-12-06: 20 mg via INTRAMUSCULAR
  Filled 2013-12-06: qty 2

## 2013-12-06 MED ORDER — TIZANIDINE HCL 2 MG PO TABS
2.0000 mg | ORAL_TABLET | ORAL | Status: DC
  Filled 2013-12-06: qty 1

## 2013-12-06 MED ORDER — ASPIRIN EC 81 MG PO TBEC
81.0000 mg | DELAYED_RELEASE_TABLET | Freq: Every day | ORAL | Status: DC
  Administered 2013-12-07 – 2013-12-11 (×5): 81 mg via ORAL
  Filled 2013-12-06 (×5): qty 1

## 2013-12-06 MED ORDER — TIZANIDINE HCL 2 MG PO TABS: 2.0000 mg | ORAL_TABLET | Freq: Four times a day (QID) | ORAL | Status: DC | PRN

## 2013-12-06 MED ORDER — SODIUM CHLORIDE 0.9 % IV SOLN: INTRAVENOUS | Status: DC

## 2013-12-06 MED ORDER — IOHEXOL 300 MG/ML  SOLN
25.0000 mL | INTRAMUSCULAR | Status: AC
  Administered 2013-12-06: 25 mL via ORAL

## 2013-12-06 MED ORDER — ACETAMINOPHEN 650 MG RE SUPP: 650.0000 mg | Freq: Four times a day (QID) | RECTAL | Status: DC | PRN

## 2013-12-06 MED ORDER — ONDANSETRON HCL 4 MG/2ML IJ SOLN: 4.0000 mg | Freq: Four times a day (QID) | INTRAMUSCULAR | Status: DC | PRN

## 2013-12-06 MED ORDER — PANTOPRAZOLE SODIUM 40 MG PO TBEC
40.0000 mg | DELAYED_RELEASE_TABLET | Freq: Every day | ORAL | Status: DC
  Administered 2013-12-07 – 2013-12-11 (×5): 40 mg via ORAL
  Filled 2013-12-06 (×5): qty 1

## 2013-12-06 MED ORDER — TIZANIDINE HCL 2 MG PO TABS
2.0000 mg | ORAL_TABLET | Freq: Every day | ORAL | Status: DC
  Filled 2013-12-06 (×2): qty 1

## 2013-12-06 MED ORDER — SODIUM CHLORIDE 0.9 % IV SOLN
INTRAVENOUS | Status: DC
  Administered 2013-12-06: 20:00:00 via INTRAVENOUS

## 2013-12-06 MED ORDER — INSULIN ASPART 100 UNIT/ML ~~LOC~~ SOLN
0.0000 [IU] | SUBCUTANEOUS | Status: DC
  Administered 2013-12-07 – 2013-12-08 (×4): 1 [IU] via SUBCUTANEOUS

## 2013-12-06 MED ORDER — LEVOTHYROXINE SODIUM 125 MCG PO TABS
125.0000 ug | ORAL_TABLET | Freq: Every day | ORAL | Status: DC
  Administered 2013-12-07 – 2013-12-11 (×5): 125 ug via ORAL
  Filled 2013-12-06 (×7): qty 1

## 2013-12-06 NOTE — H&P (Signed)
History and Physical       Hospital Admission Note Date: 12/06/2013  Patient name: [name] Medical record number: [id number] Date of birth: 1950/07/10 Age: 63 y.o. Gender: female  PCP: [name], MD    Chief Complaint:  Abdominal pain with nausea, diarrhea for last 3 days  HPI: Patient is a 63 year old female with history of hypertension, hyperlipidemia, Crohn's disease, diabetes, prior history of CVA with left-sided hemiparesis, presented with above symptoms. History was obtained from the patient who reported that she has abdominal pain, cramping, diffusely for the last 3 days associated with nausea, diarrhea. Patient had 6 episodes of watery diarrhea today and denied any hematochezia or melena. She does have a history of chronic diarrhea, reports that her last Crohn's flare was in July 2015 when she was admitted.   Review of Systems:  Constitutional: Denies fever, + chills, diaphoresis, + poor appetite and fatigue.  HEENT: Denies photophobia, eye pain, redness, hearing loss, ear pain, congestion, sore throat, rhinorrhea, sneezing, mouth sores, trouble swallowing, neck pain, neck stiffness and tinnitus.   Respiratory: Denies SOB, DOE, cough, chest tightness,  and wheezing.   Cardiovascular: Denies chest pain, palpitations and leg swelling.  Gastrointestinal: Please see history of present illness  Genitourinary: Denies dysuria, urgency, frequency, hematuria, flank pain and difficulty urinating.  Musculoskeletal: Denies myalgias, back pain, joint swelling, arthralgias and gait problem.  Skin: Denies pallor, rash and wound.  Neurological: Denies dizziness, seizures, syncope, weakness, light-headedness, numbness and headaches.  Hematological: Denies adenopathy. Easy bruising, personal or family bleeding history  Psychiatric/Behavioral: Denies suicidal ideation, mood changes, confusion, nervousness, sleep disturbance and  agitation  Past Medical History: Past Medical History  Diagnosis Date  . Diabetes mellitus   . GERD (gastroesophageal reflux disease)   . Hyperlipidemia   . Hypertension   . Stroke   . Thyroid disease   . Visual disturbance     Patient has vision loss.  . Constipation   . Bruises easily   . Lump in female breast   . Hemiparesis and alteration of sensations as late effects of stroke 01/26/2013  . Abnormality of gait 01/26/2013  . Dizziness and giddiness 01/26/2013  . Crohn's disease   . Depression   . Panic disorder    Past Surgical History  Procedure Laterality Date  . Cesarean section  1981  . Cesarean section  1983  . Cholecystectomy  1986  . Small intestine surgery  05/1991  . Av fistula repair  2008  . Breast surgery      Medications: Prior to Admission medications   Medication Sig Start Date End Date Taking? Authorizing Provider  aspirin 81 MG tablet Take 81 mg by mouth daily.   Yes Historical Provider, MD  Dulaglutide (TRULICITY [place]) Inject 1 each into the skin every 7 (seven) days. Every Friday   Yes Historical Provider, MD  levothyroxine (SYNTHROID, LEVOTHROID) 125 MCG tablet Take 125 mcg by mouth Daily.  04/01/11  Yes Historical Provider, MD  pantoprazole (PROTONIX) 40 MG tablet Take 40 mg by mouth daily.  04/01/11  Yes Historical Provider, MD  ramipril (ALTACE) 2.5 MG capsule Take 2.5 mg by mouth daily.  04/01/11  Yes Historical Provider, MD  repaglinide (PRANDIN) 0.5 MG tablet Take 0.5 mg by mouth 2 (two) times daily before a meal.  01/11/13  Yes Historical Provider, MD  simvastatin (ZOCOR) 10 MG tablet Take 10 mg by mouth Daily.  04/01/11  Yes Historical Provider, MD  tiZANidine (ZANAFLEX) 2 MG tablet Take 2-4 mg by  mouth every 6 (six) hours as needed for muscle spasms. Take 43m every morning, 130mat noon, and 24m16mt bedtime.   Yes Historical Provider, MD    Allergies:   Allergies  Allergen Reactions  . Codeine Other (See Comments)    Syncope.  . Morphine And  Related Nausea And Vomiting and Other (See Comments)    Hallucinations.  . Baclofen Nausea Only and Other (See Comments)    dizziness  . Penicillins Other (See Comments)    Family history  . Sulfur Itching    Per patient happened years ago.    Social History:  reports that she quit smoking about 20 years ago. She has never used smokeless tobacco. She reports that she does not drink alcohol or use illicit drugs.  Family History: Family History  Problem Relation Age of Onset  . Heart disease Mother   . Heart disease Father   . Diabetes Brother   . Diabetes Brother   . Migraines Son     Physical Exam: Blood pressure 126/78, pulse 81, temperature 98.8 F (37.1 C), temperature source Oral, resp. rate 16, height _0  (1.626 m), weight 77.111 kg (170 lb), SpO2 98.00%. General: Alert, awake, oriented x3, in no acute distress. HEENT: normocephalic, atraumatic, anicteric sclera, pink conjunctiva, pupils equal and reactive to light and accomodation, oropharynx clear Neck: supple, no masses or lymphadenopathy, no goiter, no bruits  Heart: Regular rate and rhythm, without murmurs, rubs or gallops. Lungs: Clear to auscultation bilaterally, no wheezing, rales or rhonchi. Abdomen: Soft,  diffuse tenderness to deep palpation, no rebound or guarding  Extremities: No clubbing, cyanosis or edema with positive pedal pulses. Neuro: Grossly intact, no focal neurological deficits, strength 5/5 upper and lower extremities bilaterally Psych: alert and oriented x 3, normal mood and affect Skin: no rashes or lesions, warm and dry   LABS on Admission:  Basic Metabolic Panel:  Recent Labs Lab 12/06/13 1230  NA 140  K 4.0  CL 103  CO2 24  GLUCOSE 119*  BUN 11  CREATININE 0.71  CALCIUM 9.3   Liver Function Tests:  Recent Labs Lab 12/06/13 1230  AST 35  ALT 37*  ALKPHOS 60  BILITOT 0.6  PROT 7.6  ALBUMIN 4.0    Recent Labs Lab 12/06/13 1230  LIPASE 38   No results found for  this basename: AMMONIA,  in the last 168 hours CBC:  Recent Labs Lab 12/06/13 1230  WBC 6.8  NEUTROABS 3.8  HGB 12.2  HCT 37.8  MCV 77.5*  PLT 226   Cardiac Enzymes: No results found for this basename: CKTOTAL, CKMB, CKMBINDEX, TROPONINI,  in the last 168 hours BNP: No components found with this basename: POCBNP,  CBG: No results found for this basename: GLUCAP,  in the last 168 hours   Radiological Exams on Admission: Dg Abd Acute W/chest  12/06/2013   CLINICAL DATA:  Abdominal pain.  Crohn's disease.  EXAM: ACUTE ABDOMEN SERIES (ABDOMEN 2 VIEW & CHEST 1 VIEW)  COMPARISON:  06/24/2006  FINDINGS: There is no evidence of dilated bowel loops or free intraperitoneal air. No radiopaque calculi or other significant radiographic abnormality is seen. Surgical clips noted in the right upper quadrant.  Heart size and mediastinal contours are within normal limits. Both lungs are clear.  IMPRESSION: No acute findings.   Electronically Signed   By: JohEarle GellD.   On: 12/06/2013 16:45    Assessment/Plan Principal Problem:   Abdominal pain with history of Crohn's disease, history  of small bowel resection 20 years ago - Will admit to MedSurg, n.p.o., place on gentle hydration, pain control, obtain CT abdomen and pelvis. KUB negative for any small bowel obstruction - Obtain Cdiff PCR, GI pathogen panel, ESR, CRP - Patient refused Flagyl stating that it gives her diarrhea. Hold off on ciprofloxacin until C. difficile is ruled out - EDP, Dr Vanita Panda requested GI consult  - If no nausea or vomiting, will advance diet to clears in a.m.   Active Problems:   Hemiparesis and alteration of sensations as late effects of stroke - Continue aspirin    Diabetes mellitus - Place on sliding scale insulin, obtain hemoglobin A1c    Hypothyroidism - Continue Synthroid, check TSH   DVT prophylaxis:  Lovenox   CODE STATUS:  full CODE STATUS   Family Communication: Admission, patients condition and  plan of care including tests being ordered have been discussed with the patient who indicates understanding and agree with the plan and Code Status   Further plan will depend as patient's clinical course evolves and further radiologic and laboratory data become available.   Time Spent on Admission: 1 hour  RAI,RIPUDEEP M.D. [facility] 12/06/2013, 5:40 PM Pager: [phone]  If 7PM-7AM, please contact night-coverage [email or web address] Password [email or web address]

## 2013-12-06 NOTE — Progress Notes (Signed)
[name] [id number] Admission Data: 12/06/2013 8:08 PM Attending Provider: Mendel Corning, MD TIW:PYKDXIP,JASNKN K, MD Code Status: Full  [name] is a 63 y.o. female patient admitted from ED:  -No acute distress noted.  -No complaints of shortness of breath.  -No complaints of chest pain.   Cardiac Monitoring: N/a  Blood pressure 130/78, pulse 76, temperature 98.2 F (36.8 C), temperature source Oral, resp. rate 18, height 5\' 4"  (1.626 m), weight 77.111 kg (170 lb), SpO2 98.00%.   IV Fluids:  IV in place, occlusive dsg intact without redness, IV cath antecubital right, condition patent and no redness.  Allergies:  Codeine; Morphine and related; Baclofen; Penicillins; and Sulfur  Past Medical History:   has a past medical history of Diabetes mellitus; GERD (gastroesophageal reflux disease); Hyperlipidemia; Hypertension; Stroke; Thyroid disease; Visual disturbance; Constipation; Bruises easily; Lump in female breast; Hemiparesis and alteration of sensations as late effects of stroke (01/26/2013); Abnormality of gait (01/26/2013); Dizziness and giddiness (01/26/2013); Crohn's disease; Depression; and Panic disorder.  Past Surgical History:   has past surgical history that includes Cesarean section (1981); Cesarean section (1983); Cholecystectomy (1986); Small intestine surgery (05/1991); AV fistula repair (2008); and Breast surgery.  Social History:   reports that she quit smoking about 20 years ago. She has never used smokeless tobacco. She reports that she does not drink alcohol or use illicit drugs.  Skin: NSI  Patient/Family orientated to room. Information packet given to patient/family. Admission inpatient armband information verified with patient/family to include name and date of birth and placed on patient arm. Side rails up x 2, fall assessment and education completed with patient/family. Patient/family able to verbalize understanding of risk associated with falls and verbalized  understanding to call for assistance before getting out of bed. Call light within reach. Patient/family able to voice and demonstrate understanding of unit orientation instructions.

## 2013-12-06 NOTE — ED Notes (Signed)
Pt presents with 3 day h/o abdominal pain and diarrhea.  Pt reports pain to R side and epigastric area that has been constant; +nausea, pt denies dysuria.

## 2013-12-06 NOTE — ED Notes (Addendum)
MD Vanita Panda at the bedside. Patient returned from [place].

## 2013-12-06 NOTE — ED Provider Notes (Signed)
I spoke with Dr. Michail Sermon who is on call for GI who will put pt on their consult list.  Pt states that her regular GI doctor is with [facility].  Malvin Johns, MD 12/06/13 [phone]

## 2013-12-06 NOTE — ED Notes (Signed)
MD lockwood at the bedside.

## 2013-12-06 NOTE — ED Provider Notes (Signed)
CSN: [id number]     Arrival date & time 12/06/13  1205 History   First MD Initiated Contact with Patient 12/06/13 1530     No chief complaint on file.    HPI  Patient presents with worsening abdominal pain, diarrhea, generalized discomfort, nausea, anorexia. Symptoms began 3 days ago, gradually has become worse. No relief with OTC medication or anything. There is associated nausea, by mouth intolerance. Patient has had innumerable episodes of loose stool. Pain is diffuse, and largely right sided, crampy. Pain is similar to multiple prior Crohn disease flare. Patient has not spoken with her gastroenterologist. No fever, chills.  Past Medical History  Diagnosis Date  . Diabetes mellitus   . GERD (gastroesophageal reflux disease)   . Hyperlipidemia   . Hypertension   . Stroke   . Thyroid disease   . Visual disturbance     Patient has vision loss.  . Constipation   . Bruises easily   . Lump in female breast   . Hemiparesis and alteration of sensations as late effects of stroke 01/26/2013  . Abnormality of gait 01/26/2013  . Dizziness and giddiness 01/26/2013  . Crohn's disease   . Depression   . Panic disorder    Past Surgical History  Procedure Laterality Date  . Cesarean section  1981  . Cesarean section  1983  . Cholecystectomy  1986  . Small intestine surgery  05/1991  . Av fistula repair  2008  . Breast surgery     Family History  Problem Relation Age of Onset  . Heart disease Mother   . Heart disease Father   . Diabetes Brother   . Diabetes Brother   . Migraines Son    History  Substance Use Topics  . Smoking status: Former Smoker    Quit date: 04/16/1993  . Smokeless tobacco: Never Used  . Alcohol Use: No   OB History   Grav Para Term Preterm Abortions TAB SAB Ect Mult Living                 Review of Systems  Constitutional:       Per HPI, otherwise negative  HENT:       Per HPI, otherwise negative  Respiratory:       Per HPI, otherwise  negative  Cardiovascular:       Per HPI, otherwise negative  Gastrointestinal: Negative for vomiting.  Endocrine:       Negative aside from HPI  Genitourinary:       Neg aside from HPI   Musculoskeletal:       Per HPI, otherwise negative  Skin: Negative.   Neurological: Negative for syncope.      Allergies  Codeine; Morphine and related; Baclofen; Penicillins; and Sulfur  Home Medications   Prior to Admission medications   Medication Sig Start Date End Date Taking? Authorizing Provider  aspirin 81 MG tablet Take 81 mg by mouth daily.   Yes Historical Provider, MD  Dulaglutide (TRULICITY [place]) Inject 1 each into the skin every 7 (seven) days. Every Friday   Yes Historical Provider, MD  levothyroxine (SYNTHROID, LEVOTHROID) 125 MCG tablet Take 125 mcg by mouth Daily.  04/01/11  Yes Historical Provider, MD  pantoprazole (PROTONIX) 40 MG tablet Take 40 mg by mouth daily.  04/01/11  Yes Historical Provider, MD  ramipril (ALTACE) 2.5 MG capsule Take 2.5 mg by mouth daily.  04/01/11  Yes Historical Provider, MD  repaglinide (PRANDIN) 0.5 MG tablet Take 0.5 mg by  mouth 2 (two) times daily before a meal.  01/11/13  Yes Historical Provider, MD  simvastatin (ZOCOR) 10 MG tablet Take 10 mg by mouth Daily.  04/01/11  Yes Historical Provider, MD  tiZANidine (ZANAFLEX) 2 MG tablet Take 2-4 mg by mouth every 6 (six) hours as needed for muscle spasms. Take 45m every morning, 188mat noon, and 67m39mt bedtime.   Yes Historical Provider, MD   BP 126/78  Pulse 81  Temp(Src) 98.8 F (37.1 C) (Oral)  Resp 16  Ht 5' 4"  (1.626 m)  Wt 170 lb (77.111 kg)  BMI 29.17 kg/m2  SpO2 98% Physical Exam  Nursing note and vitals reviewed. Constitutional: She is oriented to person, place, and time. She appears well-developed and well-nourished. No distress.  HENT:  Head: Normocephalic and atraumatic.  Eyes: Conjunctivae and EOM are normal.  Cardiovascular: Normal rate and regular rhythm.   Pulmonary/Chest: Effort  normal and breath sounds normal. No stridor. No respiratory distress.  Abdominal: She exhibits no distension. There is generalized tenderness. There is guarding. There is no rigidity and no rebound.  Musculoskeletal: She exhibits no edema.  Neurological: She is alert and oriented to person, place, and time. No cranial nerve deficit.  Skin: Skin is warm and dry.  Psychiatric: She has a normal mood and affect.    ED Course  Procedures (including critical care time) Labs Review Labs Reviewed  CBC WITH DIFFERENTIAL - Abnormal; Notable for the following:    MCV 77.5 (*)    MCH 25.0 (*)    RDW 15.9 (*)    All other components within normal limits  COMPREHENSIVE METABOLIC PANEL - Abnormal; Notable for the following:    Glucose, Bld 119 (*)    ALT 37 (*)    All other components within normal limits  URINALYSIS, ROUTINE W REFLEX MICROSCOPIC - Abnormal; Notable for the following:    Ketones, ur 15 (*)    Leukocytes, UA SMALL (*)    All other components within normal limits  URINE MICROSCOPIC-ADD ON - Abnormal; Notable for the following:    Squamous Epithelial / LPF FEW (*)    All other components within normal limits  CLOSTRIDIUM DIFFICILE BY PCR  LIPASE, BLOOD    Imaging Review Dg Abd Acute W/chest  12/06/2013   CLINICAL DATA:  Abdominal pain.  Crohn's disease.  EXAM: ACUTE ABDOMEN SERIES (ABDOMEN 2 VIEW & CHEST 1 VIEW)  COMPARISON:  06/24/2006  FINDINGS: There is no evidence of dilated bowel loops or free intraperitoneal air. No radiopaque calculi or other significant radiographic abnormality is seen. Surgical clips noted in the right upper quadrant.  Heart size and mediastinal contours are within normal limits. Both lungs are clear.  IMPRESSION: No acute findings.   Electronically Signed   By: JohEarle GellD.   On: 12/06/2013 16:45    I reviewed the EMR.  100 exam the patient continues to have crampy pain, nausea. We discussed all findings.   MDM  This patient's Crohn's disease  presents with abdominal pain, nausea, diarrhea and anorexia with by mouth intolerance. Patient is afebrile, has been on peritoneal abdomen, but given her prescription of increasing pain, inability to take medicine and is patient received antispasmodics, fluids, antiemetics, was admitted for further evaluation and management.     RobCarmin MuskratD 12/06/13 [phone]

## 2013-12-06 NOTE — ED Notes (Signed)
Called CT. Patient finished contrast.  

## 2013-12-06 NOTE — ED Notes (Signed)
MD Belfi at the bedside  

## 2013-12-06 NOTE — ED Notes (Signed)
Admitting Physician at the bedside.  

## 2013-12-07 DIAGNOSIS — I69359 Hemiplegia and hemiparesis following cerebral infarction affecting unspecified side: Secondary | ICD-10-CM

## 2013-12-07 DIAGNOSIS — I69398 Other sequelae of cerebral infarction: Secondary | ICD-10-CM

## 2013-12-07 LAB — CBC
HEMATOCRIT: 33.5 % — AB (ref 36.0–46.0)
Hemoglobin: 10.6 g/dL — ABNORMAL LOW (ref 12.0–15.0)
MCH: 24.8 pg — ABNORMAL LOW (ref 26.0–34.0)
MCHC: 31.6 g/dL (ref 30.0–36.0)
MCV: 78.5 fL (ref 78.0–100.0)
Platelets: 183 10*3/uL (ref 150–400)
RBC: 4.27 MIL/uL (ref 3.87–5.11)
RDW: 15.9 % — AB (ref 11.5–15.5)
WBC: 5.2 10*3/uL (ref 4.0–10.5)

## 2013-12-07 LAB — GI PATHOGEN PANEL BY PCR, STOOL
C difficile toxin A/B: POSITIVE
CAMPYLOBACTER BY PCR: NEGATIVE
Cryptosporidium by PCR: NEGATIVE
E COLI (STEC): NEGATIVE
E COLI 0157 BY PCR: NEGATIVE
E coli (ETEC) LT/ST: NEGATIVE
G lamblia by PCR: NEGATIVE
NOROVIRUS G1/G2: NEGATIVE
Rotavirus A by PCR: NEGATIVE
Salmonella by PCR: NEGATIVE
Shigella by PCR: NEGATIVE

## 2013-12-07 LAB — GLUCOSE, CAPILLARY
GLUCOSE-CAPILLARY: 107 mg/dL — AB (ref 70–99)
GLUCOSE-CAPILLARY: 119 mg/dL — AB (ref 70–99)
GLUCOSE-CAPILLARY: 120 mg/dL — AB (ref 70–99)
GLUCOSE-CAPILLARY: 128 mg/dL — AB (ref 70–99)
Glucose-Capillary: 110 mg/dL — ABNORMAL HIGH (ref 70–99)
Glucose-Capillary: 111 mg/dL — ABNORMAL HIGH (ref 70–99)

## 2013-12-07 LAB — HEMOGLOBIN A1C
HEMOGLOBIN A1C: 7.4 % — AB (ref <5.7)
MEAN PLASMA GLUCOSE: 166 mg/dL — AB (ref <117)

## 2013-12-07 LAB — BASIC METABOLIC PANEL
ANION GAP: 12 (ref 5–15)
BUN: 10 mg/dL (ref 6–23)
CALCIUM: 8.3 mg/dL — AB (ref 8.4–10.5)
CO2: 21 mEq/L (ref 19–32)
Chloride: 108 mEq/L (ref 96–112)
Creatinine, Ser: 0.69 mg/dL (ref 0.50–1.10)
GFR calc non Af Amer: >90 mL/min (ref >90)
Glucose, Bld: 120 mg/dL — ABNORMAL HIGH (ref 70–99)
Potassium: 3.8 mEq/L (ref 3.7–5.3)
SODIUM: 141 meq/L (ref 137–147)

## 2013-12-07 LAB — C-REACTIVE PROTEIN

## 2013-12-07 MED ORDER — LOPERAMIDE HCL 1 MG/5ML PO LIQD
0.5000 mg | ORAL | Status: DC | PRN
  Administered 2013-12-08 – 2013-12-11 (×4): 0.5 mg via ORAL
  Filled 2013-12-07 (×4): qty 5

## 2013-12-07 MED ORDER — TIZANIDINE HCL 2 MG PO TABS
1.0000 mg | ORAL_TABLET | ORAL | Status: DC
  Administered 2013-12-07 – 2013-12-10 (×4): 1 mg via ORAL
  Filled 2013-12-07 (×5): qty 0.5

## 2013-12-07 MED ORDER — TIZANIDINE HCL 2 MG PO TABS
2.0000 mg | ORAL_TABLET | Freq: Every day | ORAL | Status: DC
  Administered 2013-12-07 – 2013-12-10 (×4): 2 mg via ORAL
  Filled 2013-12-07 (×5): qty 1

## 2013-12-07 MED ORDER — TIZANIDINE HCL 2 MG PO TABS
1.0000 mg | ORAL_TABLET | Freq: Every day | ORAL | Status: DC
  Administered 2013-12-08 – 2013-12-11 (×4): 1 mg via ORAL
  Filled 2013-12-07 (×6): qty 0.5

## 2013-12-07 NOTE — Evaluation (Addendum)
Occupational Therapy Evaluation Patient Details Name: [name] MRN: [id number] DOB: [birth year] Today's Date: 12/07/2013    History of Present Illness  63 year old female with history of hypertension, hyperlipidemia, Crohn's disease, diabetes, prior history of CVA with left-sided hemiparesis, presented with abdominal pain with nausea and diarrhea   Clinical Impression   Pt admitted with above. Educated on safety. OT signing off as pt seems back to baseline.    Follow Up Recommendations  No OT follow up;Supervision - Intermittent    Equipment Recommendations  None recommended by OT    Recommendations for Other Services       Precautions / Restrictions Restrictions Weight Bearing Restrictions: No      Mobility Bed Mobility               General bed mobility comments: not assessed-sitting EOB  Transfers Overall transfer level: Modified independent                    Balance                                            ADL Overall ADL's : Needs assistance/impaired                     Lower Body Dressing: Modified independent;Sit to/from stand   Toilet Transfer: Modified Independent;Ambulation (bed; cane)           Functional mobility during ADLs: Cane;Modified independent General ADL Comments: Recommended pt have someone with her for tub transfer but pt states she is safe with technique and uses grab bar. OT still recommended someone be with her. Discussed backing to chair and swinging legs in but pt says she has difficulty getting leg up-OT tried to give her ideas to assist LLE but pt not interested. Reviewed safety tips (rugs, sitting for LB ADLs, pet). Told pt to be using LUE during activities. Pt recently stopped therapy due to finances.      Vision  Pt with baseline homonomous hemianopsia (does not drive). Tested visual fields and demonstrated left homonomous hemianopsia (pt very aware of this).                     Perception     Praxis      Pertinent Vitals/Pain Pain Assessment: Faces Faces Pain Scale: Hurts a little bit Pain Location: abdomen Pain Intervention(s): Monitored during session     Hand Dominance     Extremity/Trunk Assessment Upper Extremity Assessment Upper Extremity Assessment: LUE deficits/detail LUE Deficits / Details: residual weakness and increased tone from previous CVA.    Lower Extremity Assessment Lower Extremity Assessment: LLE deficits/detail- residual weakness       Communication Communication Communication: No difficulties   Cognition Arousal/Alertness: Awake/alert Behavior During Therapy: WFL for tasks assessed/performed Overall Cognitive Status: Within Functional Limits for tasks assessed                     General Comments       Exercises       Shoulder Instructions      Home Living Family/patient expects to be discharged to:: Private residence Living Arrangements: Spouse/significant other Available Help at Discharge: Family;Available PRN/intermittently Type of Home: House Home Access: Ramped entrance     Home Layout: One level     Bathroom Shower/Tub: Teacher, early years/pre:  Standard (sink close)     Home Equipment: Cane - single point;Shower seat;Grab bars - tub/shower          Prior Functioning/Environment Level of Independence: Independent with assistive device(s) (normally uses service dog when ambulating)             OT Diagnosis: Acute pain   OT Problem List:     OT Treatment/Interventions:      OT Goals(Current goals can be found in the care plan section)    OT Frequency:     Barriers to D/C:            Co-evaluation              End of Session Equipment Utilized During Treatment: Gait belt;Other (comment) (cane)  Activity Tolerance: Patient tolerated treatment well Patient left: in bed   Time: 3329-5188 OT Time Calculation (min): 13 min Charges:  OT General  Charges $OT Visit: 1 Procedure OT Evaluation $Initial OT Evaluation Tier I: 1 Procedure G-CodesBenito Mccreedy OTR/L [phone] 12/07/2013, 3:27 PM

## 2013-12-07 NOTE — Consult Note (Signed)
Referring Provider: Dr. Hartford Poli Primary Care Physician:  [name], MD Primary Gastroenterologist:  Dr. Earle Gell  Reason for Consultation:  Abdominal pain and diarrhea  HPI: [name] is a 63 y.o. female with a history of Crohn's disease status post ileal resection many years ago, not involving the ileal cecal valve or cecum, followed by Dr. Earle Gell and not requiring any ongoing medication in recent years.   With that background, the patient was admitted through the emergency room yesterday because of abdominal pain which began 3 days ago, followed by diarrhea and severe cramps which awakened her out of sleep at 3 in the morning about 48 hours ago. Since then, she's been having frequent loose stools, which today has become watery in character, not associated with severe cramps or bleeding.   It is hard to pin down an exact number, but I get the impression that she is having between 5 and 10 bowel movements per day. Since the first night it occurred, however, there has not been any further nocturnal diarrhea.   Of note, this patient has what sounds like irritable bowel syndrome of the alternating variety, flip-flopping between constipation and diarrhea. Typically she will get diarrhea, then use some Imodium, and then develop constipation and the cycle will then repeat itself. Therefore, the current illness is somewhat characteristic of what she is accustomed to having. She used to take colestipol, but she found it tended to constipate her. States that she has tried Electronics engineer probiotic therapy for several weeks, without any evident benefit.  She had colonoscopy by Dr. Wynetta Emery in September 2013 which was normal all way to the cecum, including random mucosal biopsies of the colon.   A CT scan on the current admission showed equivocal thickening of the small bowel and localized dilatation in association with one of her 2 anastomotic regions, but nothing impressive, no evidence of bowel  obstruction and no definite evidence of ileitis or other intestinal inflammation.  The patient's blood work on admission is unremarkable, including on normal sedimentation rate and CRP. Stool studies are pending.  The patient denies typical infectious causes of diarrhea, such as association with someone with a diarrheal illness, recent foreign travel, new pets at home, exposure to well water, camping, etc.  Past Medical History  Diagnosis Date  . GERD (gastroesophageal reflux disease)   . Hyperlipidemia   . Hypertension   . Constipation   . Bruises easily   . Lump in female breast   . Hemiparesis and alteration of sensations as late effects of stroke 01/26/2013  . Abnormality of gait 01/26/2013  . Dizziness and giddiness 01/26/2013  . Crohn's disease   . Depression   . Panic disorder   . Hemianopia     left  . PONV (postoperative nausea and vomiting)   . Stroke 2009  . Hypothyroidism   . Type II diabetes mellitus     Past Surgical History  Procedure Laterality Date  . Cesarean section  1981; 1983  . Cholecystectomy  1986  . Small intestine surgery  05/1991  . Anal fissure repair  2008  . Appendectomy  1986  . Breast lumpectomy Right 1990's  . Tubal ligation  1983    Prior to Admission medications   Medication Sig Start Date End Date Taking? Authorizing Provider  aspirin 81 MG tablet Take 81 mg by mouth daily.   Yes Historical Provider, MD  Dulaglutide (TRULICITY [place]) Inject 1 each into the skin every 7 (seven) days. Every Friday  Yes Historical Provider, MD  levothyroxine (SYNTHROID, LEVOTHROID) 125 MCG tablet Take 125 mcg by mouth Daily.  04/01/11  Yes Historical Provider, MD  pantoprazole (PROTONIX) 40 MG tablet Take 40 mg by mouth daily.  04/01/11  Yes Historical Provider, MD  ramipril (ALTACE) 2.5 MG capsule Take 2.5 mg by mouth daily.  04/01/11  Yes Historical Provider, MD  repaglinide (PRANDIN) 0.5 MG tablet Take 0.5 mg by mouth 2 (two) times daily before a meal.  01/11/13   Yes Historical Provider, MD  simvastatin (ZOCOR) 10 MG tablet Take 10 mg by mouth Daily.  04/01/11  Yes Historical Provider, MD  tiZANidine (ZANAFLEX) 2 MG tablet Take 2-4 mg by mouth 3 (three) times daily. Take 1mg  every morning, 1mg  at noon, and 4mg  at bedtime.   Yes Historical Provider, MD    Current Facility-Administered Medications  Medication Dose Route Frequency Provider Last Rate Last Dose  . acetaminophen (TYLENOL) tablet 650 mg  650 mg Oral Q6H PRN Ripudeep Krystal Eaton, MD       Or  . acetaminophen (TYLENOL) suppository 650 mg  650 mg Rectal Q6H PRN Ripudeep K Rai, MD      . alum & mag hydroxide-simeth (MAALOX/MYLANTA) 200-200-20 MG/5ML suspension 30 mL  30 mL Oral Q6H PRN Ripudeep K Rai, MD      . aspirin EC tablet 81 mg  81 mg Oral Daily Ripudeep K Rai, MD   81 mg at 12/07/13 1025  . dicyclomine (BENTYL) capsule 10 mg  10 mg Oral TID AC & HS Ripudeep K Rai, MD   10 mg at 12/07/13 1025  . enoxaparin (LOVENOX) injection 40 mg  40 mg Subcutaneous Q24H Ripudeep K Rai, MD      . fentaNYL (SUBLIMAZE) injection 25 mcg  25 mcg Intravenous Q4H PRN Ripudeep K Rai, MD      . insulin aspart (novoLOG) injection 0-9 Units  0-9 Units Subcutaneous 6 times per day Ripudeep Krystal Eaton, MD   1 Units at 12/07/13 0900  . levothyroxine (SYNTHROID, LEVOTHROID) tablet 125 mcg  125 mcg Oral QAC breakfast Ripudeep Krystal Eaton, MD   125 mcg at 12/07/13 0900  . ondansetron (ZOFRAN) tablet 4 mg  4 mg Oral Q6H PRN Ripudeep K Rai, MD       Or  . ondansetron (ZOFRAN) injection 4 mg  4 mg Intravenous Q6H PRN Ripudeep K Rai, MD      . pantoprazole (PROTONIX) EC tablet 40 mg  40 mg Oral Daily Ripudeep K Rai, MD   40 mg at 12/07/13 1025  . ramipril (ALTACE) capsule 2.5 mg  2.5 mg Oral Daily Ripudeep K Rai, MD   2.5 mg at 12/07/13 1025  . simvastatin (ZOCOR) tablet 10 mg  10 mg Oral q1800 Ripudeep K Rai, MD      . tiZANidine (ZANAFLEX) tablet 1 mg  1 mg Oral Q24H Melton Alar, PA-C   1 mg at 12/07/13 1405  . [START ON 12/08/2013]  tiZANidine (ZANAFLEX) tablet 1 mg  1 mg Oral Q breakfast Bobby Rumpf York, PA-C      . tiZANidine (ZANAFLEX) tablet 2 mg  2 mg Oral QHS Marianne L York, PA-C        Allergies as of 12/06/2013 - Review Complete 12/06/2013  Allergen Reaction Noted  . Codeine Other (See Comments) 04/17/2011  . Morphine and related Nausea And Vomiting and Other (See Comments) 04/17/2011  . Baclofen Nausea Only and Other (See Comments) 01/26/2013  . Penicillins Other (See Comments) 12/06/2013  .  Sulfur Itching 04/17/2011    Family History  Problem Relation Age of Onset  . Heart disease Mother   . Heart disease Father   . Diabetes Brother   . Diabetes Brother   . Migraines Son     History   Social History  . Marital Status: Married    Spouse Name: [name]    Number of Children: 2  . 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 Smoking status: Former Smoker -- 1.00 packs/day for 20 years    Types: Cigarettes    Quit date: 04/16/1993  . Smokeless tobacco: Never Used  . Alcohol Use: Yes     Comment: "haven't drank since early 1980's"  . Drug Use: No  . Sexual Activity: Yes   Other Topics Concern  . Not on file   Social History Narrative   [name], has 2 children   Patient is right handed   Education level is Doctorate   Caffeine consumption is rarely, drinks decaf    Review of Systems: GI symptoms as per history of present illness. As far as I am aware, the patient is not bothered by significant upper tract symptoms. I don't think there has been any loss of weight or appetite. No problem with cough for breathing difficulties, chest pain or palpitations, urinary symptoms such as dysuria or hematuria, joint pains, new neurologic symptoms (the patient has had a stroke and has left hemiparesis), or dermatologic problems   Physical Exam: Vital signs in last 24 hours: Temp:  [97.9 F (36.6 C)-98.4 F (36.9 C)]  97.9 F (36.6 C) (10/14 0609) Pulse Rate:  [72-84] 72 (10/14 0609) Resp:  [15-23] 18 (10/14 0609) BP: (107-130)/(64-84) 129/81 mmHg (10/14 1025) SpO2:  [92 %-100 %] 100 % (10/14 0609) Weight:  [77.111 kg (170 lb)] 77.111 kg (170 lb) (10/13 2003) Last BM Date: 12/06/13 General:   Alert,  Well-developed, well-nourished, pleasant and cooperative in NAD Head:  Normocephalic and atraumatic. Eyes:  Sclera clear, no icterus.   Conjunctiva pink. Mouth:   No ulcerations or lesions.  Oropharynx pink & moist. Neck:   No masses or thyromegaly. Lungs:  Clear throughout to auscultation.   No wheezes, crackles, or rhonchi. No evident respiratory distress. Heart:   Regular rate and rhythm; no murmurs, clicks, rubs,  or gallops. Abdomen:  Soft, nontender,and nondistended. No masses, hepatosplenomegaly or ventral hernias noted. Normal bowel sounds, without bruits, guarding, or rebound.   Msk:   Symmetrical without gross deformities. However, her left lower extremity is in a brace, status post a CVA. Her left arm is somewhat contractured. Pulses:  Normal radial pulse noted. Extremities:   Without clubbing, cyanosis, or edema except for what appears to be some mild chronic edema of the left lower gently status post CVA. Neurologic:  Alert and coherent; left hemiparesis without facial droop. Skin:  Intact without significant lesions or rashes. Cervical Nodes:  No significant cervical adenopathy. Psych:   Alert and cooperative. Intermittently tearful during the evaluation, because she is "set up" with chronic GI symptoms and he needs to be a "prisoner of her GI tract."  Intake/Output from previous day:   Intake/Output this shift:    Lab Results:  Recent Labs  12/06/13 1230 12/07/13 0548  WBC 6.8 5.2  HGB 12.2 10.6*  HCT 37.8 33.5*  PLT 226 183   BMET  Recent Labs  12/06/13 1230 12/07/13 0548  NA 140  141  K 4.0 3.8  CL 103 108  CO2 24 21  GLUCOSE 119* 120*  BUN 11 10  CREATININE 0.71  0.69  CALCIUM 9.3 8.3*   LFT  Recent Labs  12/06/13 1230  PROT 7.6  ALBUMIN 4.0  AST 35  ALT 37*  ALKPHOS 60  BILITOT 0.6   PT/INR No results found for this basename: LABPROT, INR,  in the last 72 hours  Studies/Results: Ct Abdomen Pelvis W Contrast  12/06/2013   CLINICAL DATA:  Right abdominal pain, diarrhea, nausea  EXAM: CT ABDOMEN AND PELVIS WITH CONTRAST  TECHNIQUE: Multidetector CT imaging of the abdomen and pelvis was performed using the standard protocol following bolus administration of intravenous contrast.  CONTRAST:  14mL OMNIPAQUE IOHEXOL 300 MG/ML  SOLN  COMPARISON:  09/06/2013  FINDINGS: Lower chest:  Lung bases are clear.  Hepatobiliary: Hepatic steatosis with focal fatty sparing.  Status post cholecystectomy. No intrahepatic or extrahepatic ductal dilatation.  Pancreas: Within normal limits.  Spleen: Within normal limits.  Adrenals/Urinary Tract: Adrenal glands are unremarkable.  7 mm lateral interpolar left renal cyst. Right kidney is unremarkable. No hydronephrosis.  Bladder is unremarkable.  Stomach/Bowel: Stomach is within normal limits.  Two small bowel resections in the right lower abdomen (series 2/image 67). No evidence of bowel obstruction.  Wall thickening involving a single loop of small bowel just proximal to a small bowel anastomosis (series 2/image 70; series 5/image 65). No convincing mesenteric stranding/inflammatory changes.  Appendix is not discretely visualized and may be surgically absent.  Vascular/Lymphatic: Atherosclerotic calcifications of the abdominal aorta and branch vessels.  No suspicious abdominopelvic lymphadenopathy.  Reproductive: Uterus is unremarkable.  Bilateral ovaries are within normal limits.  Other: No abdominopelvic ascites.  Musculoskeletal: Degenerative changes of the visualized thoracolumbar spine.  IMPRESSION: Two prior small bowel resections in the right lower abdomen. No evidence of bowel obstruction.  Wall thickening involving a  single loop of small bowel just proximal to a small bowel anastomosis. Although equivocal, mild active inflammatory Crohn's disease is possible. No evidence of complication.  Hepatic steatosis.   Electronically Signed   By: Julian Hy M.D.   On: 12/06/2013 19:04   Dg Abd Acute W/chest  12/06/2013   CLINICAL DATA:  Abdominal pain.  Crohn's disease.  EXAM: ACUTE ABDOMEN SERIES (ABDOMEN 2 VIEW & CHEST 1 VIEW)  COMPARISON:  06/24/2006  FINDINGS: There is no evidence of dilated bowel loops or free intraperitoneal air. No radiopaque calculi or other significant radiographic abnormality is seen. Surgical clips noted in the right upper quadrant.  Heart size and mediastinal contours are within normal limits. Both lungs are clear.  IMPRESSION: No acute findings.   Electronically Signed   By: Earle Gell M.D.   On: 12/06/2013 16:45    Impression: 1. Abdominal pain, largely resolved, and diarrhea. She has chronic underlying diarrhea and the current symptoms sounds fairly typical of what she experiences at home, although perhaps more pronounced in severity. 2. Stated history of Crohn's disease involving the ileum, status post some resections, but quiescent off therapy for a number of years. (? Accuracy of that diagnosis) 3. Symptoms consistent with irritable bowel syndrome of the alternating variety 4. Possible depression, or at least dysphoria related to ongoing GI symptoms  Plan: 1. Trial of very low doses of Imodium, using the liquid formulation (which the patient was not aware is available). Hopefully, this will be enough to control or mitigate her diarrhea, without bleeding to "overshoot" constipation. 2. I tried to  clarify for the patient the goals of this hospitalization, which is to get her diarrhea under adequate control. Realistically, we will not likely find the long-term solution to her IBS symptoms while she is here. 3. However, I did encourage the patient to discuss with her primary  gastroenterologist, Dr. Earle Gell, the option of alternative methods of treatment, including possible referral to a [facility] for a second opinion. I explained that although it is not always possible to control GI symptoms, no matter who sees the patient, it is realistic to hope to reduce the symptoms to a more tolerable level.   LOS: 1 day   Rondarius Kadrmas V  12/07/2013, 4:03 PM

## 2013-12-07 NOTE — Progress Notes (Addendum)
PROGRESS NOTE  Shamariah Shewmake JHE:[id number] DOB: 1950-04-18 DOA: 12/06/2013 PCP: [name], MD  HPI/Subjective:  Patient is a 63 year old female with history of hypertension, hyperlipidemia, Crohn's disease, diabetes, prior history of CVA with left-sided hemiparesis, presented with above symptoms. History was obtained from the patient who reported that she has abdominal pain, cramping, diffusely for the last 3 days associated with nausea, diarrhea. Patient had 6 episodes of watery diarrhea on 10/13 and denied any hematochezia or melena. She does have a history of chronic diarrhea, reports that her last Crohn's flare was in July 2015 when she was admitted.  10/14- Patient is feeling better than yesterday. She did have diarrhea this morning. No blood. She mentioned some lingering mild abdominal pain in the left lower quadrant. Her appetite is still low but has improved. Tolerating clear liquid diet. Denies vomiting and dysuria.  Patient states when she is well her bowel mvmts range from constipation to diarrhea.  Patient also states she has hallucinations when taking IV steroids or prednisone.  She indicates that only Cipro helps her Crohns flares.      Assessment/Plan:  Principal Problem:   Abdominal pain/ Nausea and diarrhea without vomiting -History of Crohn's disease and small bowel resection 20 years ago - could be a mild flare vs possible C-diff. -Last Crohn's flare 08/2013. -No nausea, ++ Diarrhea, and very mild abdominal pain in morning of 10/14 -KUB and Abdominal CT negative evidence of bowel obstruction  -Cdiff PCR and GI pathogen panel pending -ESR and CRP within normal limits. -Patient refused Flagyl stating that it gives her diarrhea. Refused steroids stating they cause hallucinations. -Hold off on starting ciprofloxacin until C. difficile is ruled out  -[facility] consulted.  Dr. Cristina Gong to see later this afternoon. -Advanced to clear liquid diet this morning.  Stroke/  Hemiparesis -Stable.  PT/OT consulted. -Continue aspirin   Diabetes mellitus -Well controlled inpatient. -A1c from 10/13 is 7.4  -Takes Dulaglutide and Prandin at home. -Sliding scale insulin  Hypothyroidism -TSH within normal limits, at 2.35  - Continue Synthroid  Hyperlipidemia -Continue statin.  HTN -Ramipril continued.   DVT Prophylaxis:  Lovenox  Code Status: Full Family Communication: Patient is alert and orientated. Disposition Plan: Home   Consultants:  [facility]  Procedures:  None  Antibiotics: None  Objective: Filed Vitals:   12/06/13 2003 12/06/13 2144 12/07/13 0609 12/07/13 1025  BP: 130/78 129/84 113/77 129/81  Pulse: 76 78 72   Temp: 98.2 F (36.8 C) 98.4 F (36.9 C) 97.9 F (36.6 C)   TempSrc: Oral Oral Oral   Resp: 18 18 18    Height: 5' 4"  (1.626 m)     Weight: 77.111 kg (170 lb)     SpO2: 98% 98% 100%    No intake or output data in the 24 hours ending 12/07/13 1408 Filed Weights   12/06/13 1223 12/06/13 2003  Weight: 77.111 kg (170 lb) 77.111 kg (170 lb)    Exam: General: Well developed, well nourished, NAD, appears stated age  68:  PERR, EOMI, Anicteic Sclera, MMM. No pharyngeal erythema or exudates.  Neck: Supple, no JVD, no masses  Cardiovascular: RRR, S1 S2 auscultated, no rubs, murmurs or gallops.   Respiratory: Clear to auscultation bilaterally with equal chest rise  Abdomen: Soft, mild diffuse tenderness especially midline/inferior to umbilicus, nondistended, decreased bowel sounds  Extremities: warm dry without cyanosis clubbing or edema.  Neuro: AAOx3, cranial nerves grossly intact. Weakness in left extremities, 4/5, unable to see on left side (stroke history) Skin:  Without rashes exudates or nodules.   Psych: Normal affect and demeanor with intact judgement and insight   Data Reviewed: Basic Metabolic Panel:  Recent Labs Lab 12/06/13 1230 12/07/13 0548  NA 140 141  K 4.0 3.8  CL 103 108  CO2 24 21  GLUCOSE  119* 120*  BUN 11 10  CREATININE 0.71 0.69  CALCIUM 9.3 8.3*   Liver Function Tests:  Recent Labs Lab 12/06/13 1230  AST 35  ALT 37*  ALKPHOS 60  BILITOT 0.6  PROT 7.6  ALBUMIN 4.0    Recent Labs Lab 12/06/13 1230  LIPASE 38    CBC:  Recent Labs Lab 12/06/13 1230 12/07/13 0548  WBC 6.8 5.2  NEUTROABS 3.8  --   HGB 12.2 10.6*  HCT 37.8 33.5*  MCV 77.5* 78.5  PLT 226 183   CBG:  Recent Labs Lab 12/06/13 2000 12/07/13 0010 12/07/13 0421 12/07/13 0756 12/07/13 1133  GLUCAP 99 120* 119* 128* 107*     Studies: Ct Abdomen Pelvis W Contrast  12/06/2013   CLINICAL DATA:  Right abdominal pain, diarrhea, nausea  EXAM: CT ABDOMEN AND PELVIS WITH CONTRAST  TECHNIQUE: Multidetector CT imaging of the abdomen and pelvis was performed using the standard protocol following bolus administration of intravenous contrast.  CONTRAST:  172m OMNIPAQUE IOHEXOL 300 MG/ML  SOLN  COMPARISON:  09/06/2013  FINDINGS: Lower chest:  Lung bases are clear.  Hepatobiliary: Hepatic steatosis with focal fatty sparing.  Status post cholecystectomy. No intrahepatic or extrahepatic ductal dilatation.  Pancreas: Within normal limits.  Spleen: Within normal limits.  Adrenals/Urinary Tract: Adrenal glands are unremarkable.  7 mm lateral interpolar left renal cyst. Right kidney is unremarkable. No hydronephrosis.  Bladder is unremarkable.  Stomach/Bowel: Stomach is within normal limits.  Two small bowel resections in the right lower abdomen (series 2/image 67). No evidence of bowel obstruction.  Wall thickening involving a single loop of small bowel just proximal to a small bowel anastomosis (series 2/image 70; series 5/image 65). No convincing mesenteric stranding/inflammatory changes.  Appendix is not discretely visualized and may be surgically absent.  Vascular/Lymphatic: Atherosclerotic calcifications of the abdominal aorta and branch vessels.  No suspicious abdominopelvic lymphadenopathy.  Reproductive:  Uterus is unremarkable.  Bilateral ovaries are within normal limits.  Other: No abdominopelvic ascites.  Musculoskeletal: Degenerative changes of the visualized thoracolumbar spine.  IMPRESSION: Two prior small bowel resections in the right lower abdomen. No evidence of bowel obstruction.  Wall thickening involving a single loop of small bowel just proximal to a small bowel anastomosis. Although equivocal, mild active inflammatory Crohn's disease is possible. No evidence of complication.  Hepatic steatosis.   Electronically Signed   By: SJulian HyM.D.   On: 12/06/2013 19:04   Dg Abd Acute W/chest  12/06/2013   CLINICAL DATA:  Abdominal pain.  Crohn's disease.  EXAM: ACUTE ABDOMEN SERIES (ABDOMEN 2 VIEW & CHEST 1 VIEW)  COMPARISON:  06/24/2006  FINDINGS: There is no evidence of dilated bowel loops or free intraperitoneal air. No radiopaque calculi or other significant radiographic abnormality is seen. Surgical clips noted in the right upper quadrant.  Heart size and mediastinal contours are within normal limits. Both lungs are clear.  IMPRESSION: No acute findings.   Electronically Signed   By: JEarle GellM.D.   On: 12/06/2013 16:45    Scheduled Meds: . aspirin EC  81 mg Oral Daily  . dicyclomine  10 mg Oral TID AC & HS  . enoxaparin (LOVENOX) injection  40  mg Subcutaneous Q24H  . insulin aspart  0-9 Units Subcutaneous 6 times per day  . levothyroxine  125 mcg Oral QAC breakfast  . pantoprazole  40 mg Oral Daily  . ramipril  2.5 mg Oral Daily  . simvastatin  10 mg Oral q1800  . tiZANidine  1 mg Oral Q24H  . [START ON 12/08/2013] tiZANidine  1 mg Oral Q breakfast  . tiZANidine  2 mg Oral QHS   Continuous Infusions:   Principal Problem:   Abdominal pain Active Problems:   Hemiparesis and alteration of sensations as late effects of stroke   Crohn disease   Diabetes mellitus   Hypothyroidism    Darcella Gasman, PA-S  Imogene Burn, PA-C [facility] Pager [phone]. If  7PM-7AM, please contact night-coverage at [email or web address], password [facility] 12/07/2013, 2:08 PM  LOS: 1 day    Addendum  Patient seen and examined, chart and data base reviewed.  I agree with the above assessment and plan.  For full details please see Mr. Darcella Gasman, PA-S edited by Mrs. Imogene Burn PA-C.  I reviewed and amended the above note as appropriate.   Birdie Hopes, MD [facility] Pager: [phone] 12/07/2013, 3:16 PM

## 2013-12-07 NOTE — Progress Notes (Signed)
PT Cancellation Note  Patient Details Name: [name] MRN: [id number] DOB: 10/08/1950   Cancelled Treatment:    Reason Eval/Treat Not Completed: PT screened, no needs identified, will sign off Pt reports she is functioning at baseline and does not require any therapy services while in hospital. Explained purpose of PT evaluation however pt declined. "Am I being charged for this?"  Pt up ambulating with OT prior to PT arrival. Will sign off for now. thanks   Candy Sledge A 12/07/2013, 5:18 PM Candy Sledge, [place], DPT [phone]

## 2013-12-07 NOTE — Progress Notes (Signed)
Have been asked to see pt.    Initial chart review done.  I should be seeing pt late this afternoon.  Cleotis Nipper, M.D. [phone]

## 2013-12-07 NOTE — Progress Notes (Signed)
Pt has been refusing and questioning about the goals of care she is been getting. Writer of this note tried to explain the patient about her current medication as well as her diet restriction. Pt remained adamant and kept on comparing her previous hospitalization with [facility]. RN paged Schorr,NP and made aware. Diet modification made. We will continue to monitor.

## 2013-12-08 LAB — GLUCOSE, CAPILLARY
GLUCOSE-CAPILLARY: 146 mg/dL — AB (ref 70–99)
GLUCOSE-CAPILLARY: 137 mg/dL — AB (ref 70–99)
GLUCOSE-CAPILLARY: 126 mg/dL — AB (ref 70–99)
Glucose-Capillary: 106 mg/dL — ABNORMAL HIGH (ref 70–99)
Glucose-Capillary: 123 mg/dL — ABNORMAL HIGH (ref 70–99)
Glucose-Capillary: 138 mg/dL — ABNORMAL HIGH (ref 70–99)

## 2013-12-08 LAB — CBC
HEMATOCRIT: 34.1 % — AB (ref 36.0–46.0)
Hemoglobin: 10.8 g/dL — ABNORMAL LOW (ref 12.0–15.0)
MCH: 24.4 pg — AB (ref 26.0–34.0)
MCHC: 31.7 g/dL (ref 30.0–36.0)
MCV: 77 fL — AB (ref 78.0–100.0)
PLATELETS: 185 10*3/uL (ref 150–400)
RBC: 4.43 MIL/uL (ref 3.87–5.11)
RDW: 15.7 % — AB (ref 11.5–15.5)
WBC: 4.8 10*3/uL (ref 4.0–10.5)

## 2013-12-08 LAB — OCCULT BLOOD X 1 CARD TO LAB, STOOL: Fecal Occult Bld: NEGATIVE

## 2013-12-08 MED ORDER — SACCHAROMYCES BOULARDII 250 MG PO CAPS
500.0000 mg | ORAL_CAPSULE | Freq: Two times a day (BID) | ORAL | Status: DC
  Administered 2013-12-08 – 2013-12-11 (×6): 500 mg via ORAL
  Filled 2013-12-08 (×7): qty 2

## 2013-12-08 MED ORDER — INSULIN ASPART 100 UNIT/ML ~~LOC~~ SOLN: 0.0000 [IU] | Freq: Every day | SUBCUTANEOUS | Status: DC

## 2013-12-08 MED ORDER — INSULIN ASPART 100 UNIT/ML ~~LOC~~ SOLN
0.0000 [IU] | Freq: Three times a day (TID) | SUBCUTANEOUS | Status: DC
  Administered 2013-12-09 (×2): 1 [IU] via SUBCUTANEOUS
  Administered 2013-12-09: 3 [IU] via SUBCUTANEOUS
  Administered 2013-12-10: 2 [IU] via SUBCUTANEOUS
  Administered 2013-12-10 (×2): 1 [IU] via SUBCUTANEOUS
  Administered 2013-12-11: 2 [IU] via SUBCUTANEOUS

## 2013-12-08 MED ORDER — VANCOMYCIN 50 MG/ML ORAL SOLUTION
125.0000 mg | Freq: Four times a day (QID) | ORAL | Status: DC
  Administered 2013-12-08 – 2013-12-09 (×3): 125 mg via ORAL
  Filled 2013-12-08 (×7): qty 2.5

## 2013-12-08 NOTE — Progress Notes (Signed)
PROGRESS NOTE  [name] MRN:[id number] DOB: 01/25/1951 DOA: 12/06/2013 PCP: JOHNSON,MARTIN K, MD  HPI/Subjective:  Patient is a 62-year-old female with history of hypertension, hyperlipidemia, Crohn's disease, diabetes, prior history of CVA with left-sided hemiparesis, presented with above symptoms. History was obtained from the patient who reported that she has abdominal pain, cramping, diffusely for the last 3 days associated with nausea, diarrhea. Patient had 6 episodes of watery diarrhea on 10/13 and denied any hematochezia or melena. She does have a history of chronic diarrhea, reports that her last Crohn's flare was in July 2015 when she was admitted.  10/15- still complaining about minimal abdominal pain, epigastric, RLQ and LLQ.  Reported 6-7 mucoid bowel movements since yesterday, denies any blood in her stools.  Assessment/Plan:  Principal Problem:   Abdominal pain/ Nausea and diarrhea without vomiting -History of Crohn's disease and small bowel resection 20 years ago - could be a mild flare vs possible C-diff. -Last Crohn's flare 08/2013. -No nausea, ++ Diarrhea, and very mild abdominal pain in morning of 10/14 -KUB and Abdominal CT negative evidence of bowel obstruction  -Cdiff PCR and GI pathogen panel pending -ESR and CRP within normal limits. -Patient refused Flagyl stating that it gives her diarrhea. Refused steroids stating they cause hallucinations. -Hold off on starting ciprofloxacin until C. difficile is ruled out  -[facility] consulted.  Dr. Buccini to see later this afternoon. -Continue clear liquids.  Stroke/ Hemiparesis -Stable.  PT/OT consulted. -Continue aspirin   Diabetes mellitus -Well controlled inpatient. -A1c from 10/13 is 7.4  -Takes Dulaglutide and Prandin at home. -Sliding scale insulin  Hypothyroidism -TSH within normal limits, at 2.35  - Continue Synthroid  Hyperlipidemia -Continue statin.  HTN -Ramipril continued.   DVT  Prophylaxis:  Lovenox  Code Status: Full Family Communication: Patient is alert and orientated. Disposition Plan: Home   Consultants:  [facility]  Procedures:  None  Antibiotics: None  Objective: Filed Vitals:   12/07/13 1025 12/07/13 2112 12/08/13 0457 12/08/13 1010  BP: 129/81 120/70 109/76 105/66  Pulse:  78 74 71  Temp:  98.9 F (37.2 C) 97.9 F (36.6 C) 98.5 F (36.9 C)  TempSrc:  Oral Oral Oral  Resp:  18 18   Height:      Weight:      SpO2:  100% 100% 100%   No intake or output data in the 24 hours ending 12/08/13 1243 Filed Weights   12/06/13 1223 12/06/13 2003  Weight: 77.111 kg (170 lb) 77.111 kg (170 lb)    Exam: General: Well developed, well nourished, NAD, appears stated age  HEENT:  PERR, EOMI, Anicteic Sclera, MMM. No pharyngeal erythema or exudates.  Neck: Supple, no JVD, no masses  Cardiovascular: RRR, S1 S2 auscultated, no rubs, murmurs or gallops.   Respiratory: Clear to auscultation bilaterally with equal chest rise  Abdomen: Soft, mild diffuse tenderness especially midline/inferior to umbilicus, nondistended, decreased bowel sounds  Extremities: warm dry without cyanosis clubbing or edema.  Neuro: AAOx3, cranial nerves grossly intact. Weakness in left extremities, 4/5, unable to see on left side (stroke history) Skin: Without rashes exudates or nodules.   Psych: Normal affect and demeanor with intact judgement and insight   Data Reviewed: Basic Metabolic Panel:  Recent Labs Lab 12/06/13 1230 12/07/13 0548  NA 140 141  K 4.0 3.8  CL 103 108  CO2 24 21  GLUCOSE 119* 120*  BUN 11 10  CREATININE 0.71 0.69  CALCIUM 9.3 8.3*   Liver Function Tests:    Recent Labs Lab 12/06/13 1230  AST 35  ALT 37*  ALKPHOS 60  BILITOT 0.6  PROT 7.6  ALBUMIN 4.0    Recent Labs Lab 12/06/13 1230  LIPASE 38    CBC:  Recent Labs Lab 12/06/13 1230 12/07/13 0548 12/08/13 0623  WBC 6.8 5.2 4.8  NEUTROABS 3.8  --   --   HGB 12.2 10.6*  10.8*  HCT 37.8 33.5* 34.1*  MCV 77.5* 78.5 77.0*  PLT 226 183 185   CBG:  Recent Labs Lab 12/07/13 2009 12/08/13 0004 12/08/13 0455 12/08/13 0743 12/08/13 1153  GLUCAP 111* 138* 146* 137* 123*     Studies: Ct Abdomen Pelvis W Contrast  12/06/2013   CLINICAL DATA:  Right abdominal pain, diarrhea, nausea  EXAM: CT ABDOMEN AND PELVIS WITH CONTRAST  TECHNIQUE: Multidetector CT imaging of the abdomen and pelvis was performed using the standard protocol following bolus administration of intravenous contrast.  CONTRAST:  100mL OMNIPAQUE IOHEXOL 300 MG/ML  SOLN  COMPARISON:  09/06/2013  FINDINGS: Lower chest:  Lung bases are clear.  Hepatobiliary: Hepatic steatosis with focal fatty sparing.  Status post cholecystectomy. No intrahepatic or extrahepatic ductal dilatation.  Pancreas: Within normal limits.  Spleen: Within normal limits.  Adrenals/Urinary Tract: Adrenal glands are unremarkable.  7 mm lateral interpolar left renal cyst. Right kidney is unremarkable. No hydronephrosis.  Bladder is unremarkable.  Stomach/Bowel: Stomach is within normal limits.  Two small bowel resections in the right lower abdomen (series 2/image 67). No evidence of bowel obstruction.  Wall thickening involving a single loop of small bowel just proximal to a small bowel anastomosis (series 2/image 70; series 5/image 65). No convincing mesenteric stranding/inflammatory changes.  Appendix is not discretely visualized and may be surgically absent.  Vascular/Lymphatic: Atherosclerotic calcifications of the abdominal aorta and branch vessels.  No suspicious abdominopelvic lymphadenopathy.  Reproductive: Uterus is unremarkable.  Bilateral ovaries are within normal limits.  Other: No abdominopelvic ascites.  Musculoskeletal: Degenerative changes of the visualized thoracolumbar spine.  IMPRESSION: Two prior small bowel resections in the right lower abdomen. No evidence of bowel obstruction.  Wall thickening involving a single loop of  small bowel just proximal to a small bowel anastomosis. Although equivocal, mild active inflammatory Crohn's disease is possible. No evidence of complication.  Hepatic steatosis.   Electronically Signed   By: Sriyesh  Krishnan M.D.   On: 12/06/2013 19:04   Dg Abd Acute W/chest  12/06/2013   CLINICAL DATA:  Abdominal pain.  Crohn's disease.  EXAM: ACUTE ABDOMEN SERIES (ABDOMEN 2 VIEW & CHEST 1 VIEW)  COMPARISON:  06/24/2006  FINDINGS: There is no evidence of dilated bowel loops or free intraperitoneal air. No radiopaque calculi or other significant radiographic abnormality is seen. Surgical clips noted in the right upper quadrant.  Heart size and mediastinal contours are within normal limits. Both lungs are clear.  IMPRESSION: No acute findings.   Electronically Signed   By: John  Stahl M.D.   On: 12/06/2013 16:45    Scheduled Meds: . aspirin EC  81 mg Oral Daily  . dicyclomine  10 mg Oral TID AC & HS  . enoxaparin (LOVENOX) injection  40 mg Subcutaneous Q24H  . insulin aspart  0-9 Units Subcutaneous 6 times per day  . levothyroxine  125 mcg Oral QAC breakfast  . pantoprazole  40 mg Oral Daily  . ramipril  2.5 mg Oral Daily  . simvastatin  10 mg Oral q1800  . tiZANidine  1 mg Oral Q24H  . tiZANidine    1 mg Oral Q breakfast  . tiZANidine  2 mg Oral QHS   Continuous Infusions:   Principal Problem:   Abdominal pain Active Problems:   Hemiparesis and alteration of sensations as late effects of stroke   Crohn disease   Diabetes mellitus   Hypothyroidism    Birdie Hopes, MD [facility] Pager: [phone]  [facility] If 7PM-7AM, please contact night-coverage at [email or web address], password [facility] 12/08/2013, 12:43 PM  LOS: 2 days

## 2013-12-08 NOTE — Progress Notes (Signed)
Still diarrhea.  Stool on GI pathogen study is POSITIVE for C difficile.  D/w pt:  My experience is that the Pathogen Panel is not always reliable, so will repeat an (individual) C Diff by PCR.   In the meantime, will start Vanco and FloraStor  Abd NT, pt in NAD.  No major relief from Imodium so far, but will not increase dose until presence or absence of C diff is confirmed.  Cleotis Nipper, M.D. [phone]

## 2013-12-09 DIAGNOSIS — R269 Unspecified abnormalities of gait and mobility: Secondary | ICD-10-CM

## 2013-12-09 LAB — CLOSTRIDIUM DIFFICILE BY PCR: Toxigenic C. Difficile by PCR: NEGATIVE

## 2013-12-09 LAB — GLUCOSE, CAPILLARY
GLUCOSE-CAPILLARY: 146 mg/dL — AB (ref 70–99)
GLUCOSE-CAPILLARY: 201 mg/dL — AB (ref 70–99)
GLUCOSE-CAPILLARY: 146 mg/dL — AB (ref 70–99)
Glucose-Capillary: 194 mg/dL — ABNORMAL HIGH (ref 70–99)

## 2013-12-09 NOTE — Progress Notes (Signed)
PROGRESS NOTE  [name] EXH:[id number] DOB: [birth year] DOA: 12/06/2013 PCP: [name], MD  HPI/Subjective:  Patient is a 63 year old female with history of hypertension, hyperlipidemia, Crohn's disease, diabetes, prior history of CVA with left-sided hemiparesis, presented with above symptoms. History was obtained from the patient who reported that she has abdominal pain, cramping, diffusely for the last 3 days associated with nausea, diarrhea. Patient had 6 episodes of watery diarrhea on 10/13 and denied any hematochezia or melena. She does have a history of chronic diarrhea, reports that her last Crohn's flare was in July 2015 when she was admitted.  10/16- still complaining about abdominal pain, mucoid bowel movements are improving since yesterday.  Assessment/Plan:  Abdominal pain/ Nausea and diarrhea without vomiting -History of Crohn's disease and small bowel resection 20 years ago - could be a mild flare vs possible C-diff. -Last Crohn's flare 08/2013. -No nausea, ++ Diarrhea, and very mild abdominal pain in morning of 10/14 -KUB and Abdominal CT negative evidence of bowel obstruction  -Cdiff PCR and GI pathogen panel pending -ESR and CRP within normal limits. -Patient refused Flagyl stating that it gives her diarrhea. Refused steroids stating they cause hallucinations. -Hold off on starting ciprofloxacin until C. difficile is ruled out  -Started on vancomycin and discontinued today, positive C. difficile - negative by PCR. -GI recommended to see off of antibiotics  Stroke/ Hemiparesis -Stable.  PT/OT consulted. -Continue aspirin   Diabetes mellitus -Well controlled inpatient. -A1c from 10/13 is 7.4  -Takes Dulaglutide and Prandin at home. -Sliding scale insulin  Hypothyroidism -TSH within normal limits, at 2.35  - Continue Synthroid  Hyperlipidemia -Continue statin.  HTN -Ramipril continued.   DVT Prophylaxis:  Lovenox  Code Status: Full Family  Communication: Patient is alert and orientated. Disposition Plan: Home   Consultants:  [facility]  Procedures:  None  Antibiotics: None  Objective: Filed Vitals:   12/08/13 1010 12/08/13 2152 12/09/13 0529 12/09/13 1052  BP: 105/66 105/62 94/60 104/70  Pulse: 71 75 65   Temp: 98.5 F (36.9 C) 97.9 F (36.6 C) 98.4 F (36.9 C)   TempSrc: Oral Oral Oral   Resp:  18 18   Height:      Weight:      SpO2: 100% 100% 97%     Intake/Output Summary (Last 24 hours) at 12/09/13 1338 Last data filed at 12/08/13 2153  Gross per 24 hour  Intake    120 ml  Output      0 ml  Net    120 ml   Filed Weights   12/06/13 1223 12/06/13 2003  Weight: 77.111 kg (170 lb) 77.111 kg (170 lb)    Exam: General: Well developed, well nourished, NAD, appears stated age  22:  PERR, EOMI, Anicteic Sclera, MMM. No pharyngeal erythema or exudates.  Neck: Supple, no JVD, no masses  Cardiovascular: RRR, S1 S2 auscultated, no rubs, murmurs or gallops.   Respiratory: Clear to auscultation bilaterally with equal chest rise  Abdomen: Soft, mild diffuse tenderness especially midline/inferior to umbilicus, nondistended, decreased bowel sounds  Extremities: warm dry without cyanosis clubbing or edema.  Neuro: AAOx3, cranial nerves grossly intact. Weakness in left extremities, 4/5, unable to see on left side (stroke history) Skin: Without rashes exudates or nodules.   Psych: Normal affect and demeanor with intact judgement and insight   Data Reviewed: Basic Metabolic Panel:  Recent Labs Lab 12/06/13 1230 12/07/13 0548  NA 140 141  K 4.0 3.8  CL 103 108  CO2 24  21  GLUCOSE 119* 120*  BUN 11 10  CREATININE 0.71 0.69  CALCIUM 9.3 8.3*   Liver Function Tests:  Recent Labs Lab 12/06/13 1230  AST 35  ALT 37*  ALKPHOS 60  BILITOT 0.6  PROT 7.6  ALBUMIN 4.0    Recent Labs Lab 12/06/13 1230  LIPASE 38    CBC:  Recent Labs Lab 12/06/13 1230 12/07/13 0548 12/08/13 0623  WBC 6.8  5.2 4.8  NEUTROABS 3.8  --   --   HGB 12.2 10.6* 10.8*  HCT 37.8 33.5* 34.1*  MCV 77.5* 78.5 77.0*  PLT 226 183 185   CBG:  Recent Labs Lab 12/08/13 1153 12/08/13 1657 12/08/13 2149 12/09/13 0753 12/09/13 1144  GLUCAP 123* 106* 126* 146* 201*     Studies: Ct Abdomen Pelvis W Contrast  12/06/2013   CLINICAL DATA:  Right abdominal pain, diarrhea, nausea  EXAM: CT ABDOMEN AND PELVIS WITH CONTRAST  TECHNIQUE: Multidetector CT imaging of the abdomen and pelvis was performed using the standard protocol following bolus administration of intravenous contrast.  CONTRAST:  152m OMNIPAQUE IOHEXOL 300 MG/ML  SOLN  COMPARISON:  09/06/2013  FINDINGS: Lower chest:  Lung bases are clear.  Hepatobiliary: Hepatic steatosis with focal fatty sparing.  Status post cholecystectomy. No intrahepatic or extrahepatic ductal dilatation.  Pancreas: Within normal limits.  Spleen: Within normal limits.  Adrenals/Urinary Tract: Adrenal glands are unremarkable.  7 mm lateral interpolar left renal cyst. Right kidney is unremarkable. No hydronephrosis.  Bladder is unremarkable.  Stomach/Bowel: Stomach is within normal limits.  Two small bowel resections in the right lower abdomen (series 2/image 67). No evidence of bowel obstruction.  Wall thickening involving a single loop of small bowel just proximal to a small bowel anastomosis (series 2/image 70; series 5/image 65). No convincing mesenteric stranding/inflammatory changes.  Appendix is not discretely visualized and may be surgically absent.  Vascular/Lymphatic: Atherosclerotic calcifications of the abdominal aorta and branch vessels.  No suspicious abdominopelvic lymphadenopathy.  Reproductive: Uterus is unremarkable.  Bilateral ovaries are within normal limits.  Other: No abdominopelvic ascites.  Musculoskeletal: Degenerative changes of the visualized thoracolumbar spine.  IMPRESSION: Two prior small bowel resections in the right lower abdomen. No evidence of bowel  obstruction.  Wall thickening involving a single loop of small bowel just proximal to a small bowel anastomosis. Although equivocal, mild active inflammatory Crohn's disease is possible. No evidence of complication.  Hepatic steatosis.   Electronically Signed   By: SJulian HyM.D.   On: 12/06/2013 19:04   Dg Abd Acute W/chest  12/06/2013   CLINICAL DATA:  Abdominal pain.  Crohn's disease.  EXAM: ACUTE ABDOMEN SERIES (ABDOMEN 2 VIEW & CHEST 1 VIEW)  COMPARISON:  06/24/2006  FINDINGS: There is no evidence of dilated bowel loops or free intraperitoneal air. No radiopaque calculi or other significant radiographic abnormality is seen. Surgical clips noted in the right upper quadrant.  Heart size and mediastinal contours are within normal limits. Both lungs are clear.  IMPRESSION: No acute findings.   Electronically Signed   By: JEarle GellM.D.   On: 12/06/2013 16:45    Scheduled Meds: . aspirin EC  81 mg Oral Daily  . dicyclomine  10 mg Oral TID AC & HS  . enoxaparin (LOVENOX) injection  40 mg Subcutaneous Q24H  . insulin aspart  0-5 Units Subcutaneous QHS  . insulin aspart  0-9 Units Subcutaneous TID WC  . levothyroxine  125 mcg Oral QAC breakfast  . pantoprazole  40 mg  Oral Daily  . ramipril  2.5 mg Oral Daily  . saccharomyces boulardii  500 mg Oral BID  . simvastatin  10 mg Oral q1800  . tiZANidine  1 mg Oral Q24H  . tiZANidine  1 mg Oral Q breakfast  . tiZANidine  2 mg Oral QHS   Continuous Infusions:   Principal Problem:   Abdominal pain Active Problems:   Hemiparesis and alteration of sensations as late effects of stroke   Crohn disease   Diabetes mellitus   Hypothyroidism    Birdie Hopes, MD [facility] Pager: [phone]  [facility] If 7PM-7AM, please contact night-coverage at [email or web address], password [facility] 12/09/2013, 1:38 PM  LOS: 3 days

## 2013-12-09 NOTE — Progress Notes (Signed)
The patient feels that her diarrhea is somewhat improved compared to yesterday.  She has had a couple of doses of vancomycin and FloraStor. and looks subjectively improved.  However, her repeat stool study for C. difficile, which was a dedicated PCR test and not part of the GI pathogen panel, came back negative. I shared this information with the patient; in my experience, the GI pathogen panel seems to generate quite a few positive results which do not always seem to correlate clinically.  Given the absence of certainty as to whether the patient truly has Clostridium difficile infection (although I tend to doubt that she does), I would favor seeing how she does off the vancomycin for a few days. She is agreeable to this course of action. We will continue the FloraStor, which has activity against C. difficile as well as being helpful for IBS, which she has a long-standing history of.  If she remains significantly symptomatic and diagnostic uncertainty remains, we may need sigmoidoscopic or colonoscopic evaluation to help sort things out.  Cleotis Nipper, M.D. [phone]

## 2013-12-10 ENCOUNTER — Inpatient Hospital Stay (HOSPITAL_COMMUNITY): Payer: Medicare Other

## 2013-12-10 LAB — GLUCOSE, CAPILLARY
GLUCOSE-CAPILLARY: 160 mg/dL — AB (ref 70–99)
GLUCOSE-CAPILLARY: 151 mg/dL — AB (ref 70–99)
Glucose-Capillary: 154 mg/dL — ABNORMAL HIGH (ref 70–99)
Glucose-Capillary: 131 mg/dL — ABNORMAL HIGH (ref 70–99)

## 2013-12-10 NOTE — Progress Notes (Signed)
No bowel movements. Off vancomycin. No Imodium for the past 36 hours. The patient has slight left lower quadrant abdominal discomfort and is worried about the possibility of impending fecal obstipation.   The abdomen is nontender to exam, and just minimally tympanitic.   Impression: Diarrhea was probably an exacerbation of her IBS, and not C. difficile   Recommendation: I reassured her that, given the severity of her antecedent diarrhea, her colon is probably fairly empty, and therefore may take a few days to fill up before she starts having fecal output again.  For reassurance, I have ordered a KUB to look for evidence of stool within the colon. If that x-ray looks okay, that is, without significant fecal obstipation, I think discharge home later today would be appropriate.   Cleotis Nipper, M.D. [phone]

## 2013-12-10 NOTE — Progress Notes (Signed)
PROGRESS NOTE  [name] MQK:[id number] DOB: 12-04-1950 DOA: 12/06/2013 PCP: [name], MD  HPI/Subjective:  Patient is a 63 year old female with history of hypertension, hyperlipidemia, Crohn's disease, diabetes, prior history of CVA with left-sided hemiparesis, presented with above symptoms. History was obtained from the patient who reported that she has abdominal pain, cramping, diffusely for the last 3 days associated with nausea, diarrhea. Patient had 6 episodes of watery diarrhea on 10/13 and denied any hematochezia or melena. She does have a history of chronic diarrhea, reports that her last Crohn's flare was in July 2015 when she was admitted.  10/17 Still complaining about abdominal pain, no bowel movement since yesterday. Think she is going through the constipation phase of her symptomatology. Close her baseline.  Assessment/Plan:  Abdominal pain/ Nausea and diarrhea without vomiting -History of Crohn's disease and small bowel resection 20 years ago - could be a mild flare vs possible C-diff. -Last Crohn's flare 08/2013. -No nausea, ++ Diarrhea, and very mild abdominal pain in morning of 10/14 -KUB and Abdominal CT negative evidence of bowel obstruction  -Cdiff PCR and GI pathogen panel pending -ESR and CRP within normal limits. -Patient refused Flagyl stating that it gives her diarrhea. Refused steroids stating they cause hallucinations. -Hold off on starting ciprofloxacin until C. difficile is ruled out  -Started on vancomycin and discontinued today, positive C. difficile - negative by PCR. -GI recommended to see off of antibiotics.  Stroke/ Hemiparesis -Stable.  PT/OT consulted. -Continue aspirin   Diabetes mellitus -Well controlled inpatient. -A1c from 10/13 is 7.4  -Takes Dulaglutide and Prandin at home. -Sliding scale insulin  Hypothyroidism -TSH within normal limits, at 2.35  - Continue Synthroid  Hyperlipidemia -Continue statin.  HTN -Ramipril  continued.   DVT Prophylaxis:  Lovenox  Code Status: Full Family Communication: Patient is alert and orientated. Disposition Plan: Home   Consultants:  [facility]  Procedures:  None  Antibiotics: None  Objective: Filed Vitals:   12/09/13 1052 12/09/13 1411 12/09/13 2304 12/10/13 0523  BP: 104/70 107/62 118/69 103/61  Pulse:  79 67 77  Temp:  97.9 F (36.6 C) 98.1 F (36.7 C) 98.3 F (36.8 C)  TempSrc:  Oral Oral Oral  Resp:  18 18 18   Height:      Weight:      SpO2:  99% 100% 98%   No intake or output data in the 24 hours ending 12/10/13 1351 Filed Weights   12/06/13 1223 12/06/13 2003  Weight: 77.111 kg (170 lb) 77.111 kg (170 lb)    Exam: General: Well developed, well nourished, NAD, appears stated age  33:  PERR, EOMI, Anicteic Sclera, MMM. No pharyngeal erythema or exudates.  Neck: Supple, no JVD, no masses  Cardiovascular: RRR, S1 S2 auscultated, no rubs, murmurs or gallops.   Respiratory: Clear to auscultation bilaterally with equal chest rise  Abdomen: Soft, mild diffuse tenderness especially midline/inferior to umbilicus, nondistended, decreased bowel sounds  Extremities: warm dry without cyanosis clubbing or edema.  Neuro: AAOx3, cranial nerves grossly intact. Weakness in left extremities, 4/5, unable to see on left side (stroke history) Skin: Without rashes exudates or nodules.   Psych: Normal affect and demeanor with intact judgement and insight   Data Reviewed: Basic Metabolic Panel:  Recent Labs Lab 12/06/13 1230 12/07/13 0548  NA 140 141  K 4.0 3.8  CL 103 108  CO2 24 21  GLUCOSE 119* 120*  BUN 11 10  CREATININE 0.71 0.69  CALCIUM 9.3 8.3*   Liver Function  Tests:  Recent Labs Lab 12/06/13 1230  AST 35  ALT 37*  ALKPHOS 60  BILITOT 0.6  PROT 7.6  ALBUMIN 4.0    Recent Labs Lab 12/06/13 1230  LIPASE 38    CBC:  Recent Labs Lab 12/06/13 1230 12/07/13 0548 12/08/13 0623  WBC 6.8 5.2 4.8  NEUTROABS 3.8  --   --    HGB 12.2 10.6* 10.8*  HCT 37.8 33.5* 34.1*  MCV 77.5* 78.5 77.0*  PLT 226 183 185   CBG:  Recent Labs Lab 12/09/13 1144 12/09/13 1749 12/09/13 2245 12/10/13 0809 12/10/13 1208  GLUCAP 201* 146* 194* 154* 131*     Studies: Ct Abdomen Pelvis W Contrast  12/06/2013   CLINICAL DATA:  Right abdominal pain, diarrhea, nausea  EXAM: CT ABDOMEN AND PELVIS WITH CONTRAST  TECHNIQUE: Multidetector CT imaging of the abdomen and pelvis was performed using the standard protocol following bolus administration of intravenous contrast.  CONTRAST:  164m OMNIPAQUE IOHEXOL 300 MG/ML  SOLN  COMPARISON:  09/06/2013  FINDINGS: Lower chest:  Lung bases are clear.  Hepatobiliary: Hepatic steatosis with focal fatty sparing.  Status post cholecystectomy. No intrahepatic or extrahepatic ductal dilatation.  Pancreas: Within normal limits.  Spleen: Within normal limits.  Adrenals/Urinary Tract: Adrenal glands are unremarkable.  7 mm lateral interpolar left renal cyst. Right kidney is unremarkable. No hydronephrosis.  Bladder is unremarkable.  Stomach/Bowel: Stomach is within normal limits.  Two small bowel resections in the right lower abdomen (series 2/image 67). No evidence of bowel obstruction.  Wall thickening involving a single loop of small bowel just proximal to a small bowel anastomosis (series 2/image 70; series 5/image 65). No convincing mesenteric stranding/inflammatory changes.  Appendix is not discretely visualized and may be surgically absent.  Vascular/Lymphatic: Atherosclerotic calcifications of the abdominal aorta and branch vessels.  No suspicious abdominopelvic lymphadenopathy.  Reproductive: Uterus is unremarkable.  Bilateral ovaries are within normal limits.  Other: No abdominopelvic ascites.  Musculoskeletal: Degenerative changes of the visualized thoracolumbar spine.  IMPRESSION: Two prior small bowel resections in the right lower abdomen. No evidence of bowel obstruction.  Wall thickening involving  a single loop of small bowel just proximal to a small bowel anastomosis. Although equivocal, mild active inflammatory Crohn's disease is possible. No evidence of complication.  Hepatic steatosis.   Electronically Signed   By: SJulian HyM.D.   On: 12/06/2013 19:04   Dg Abd Acute W/chest  12/06/2013   CLINICAL DATA:  Abdominal pain.  Crohn's disease.  EXAM: ACUTE ABDOMEN SERIES (ABDOMEN 2 VIEW & CHEST 1 VIEW)  COMPARISON:  06/24/2006  FINDINGS: There is no evidence of dilated bowel loops or free intraperitoneal air. No radiopaque calculi or other significant radiographic abnormality is seen. Surgical clips noted in the right upper quadrant.  Heart size and mediastinal contours are within normal limits. Both lungs are clear.  IMPRESSION: No acute findings.   Electronically Signed   By: JEarle GellM.D.   On: 12/06/2013 16:45    Scheduled Meds: . aspirin EC  81 mg Oral Daily  . dicyclomine  10 mg Oral TID AC & HS  . enoxaparin (LOVENOX) injection  40 mg Subcutaneous Q24H  . insulin aspart  0-5 Units Subcutaneous QHS  . insulin aspart  0-9 Units Subcutaneous TID WC  . levothyroxine  125 mcg Oral QAC breakfast  . pantoprazole  40 mg Oral Daily  . ramipril  2.5 mg Oral Daily  . saccharomyces boulardii  500 mg Oral BID  .  simvastatin  10 mg Oral q1800  . tiZANidine  1 mg Oral Q24H  . tiZANidine  1 mg Oral Q breakfast  . tiZANidine  2 mg Oral QHS   Continuous Infusions:   Principal Problem:   Abdominal pain Active Problems:   Hemiparesis and alteration of sensations as late effects of stroke   Crohn disease   Diabetes mellitus   Hypothyroidism    Birdie Hopes, MD [facility] Pager: [phone]  [facility] If 7PM-7AM, please contact night-coverage at [email or web address], password [facility] 12/10/2013, 1:51 PM  LOS: 4 days

## 2013-12-11 LAB — CBC
HCT: 35.3 % — ABNORMAL LOW (ref 36.0–46.0)
HEMOGLOBIN: 10.8 g/dL — AB (ref 12.0–15.0)
MCH: 23.9 pg — ABNORMAL LOW (ref 26.0–34.0)
MCHC: 30.6 g/dL (ref 30.0–36.0)
MCV: 78.1 fL (ref 78.0–100.0)
Platelets: 192 10*3/uL (ref 150–400)
RBC: 4.52 MIL/uL (ref 3.87–5.11)
RDW: 15.9 % — AB (ref 11.5–15.5)
WBC: 6.2 10*3/uL (ref 4.0–10.5)

## 2013-12-11 LAB — COMPREHENSIVE METABOLIC PANEL
ALK PHOS: 49 U/L (ref 39–117)
ALT: 26 U/L (ref 0–35)
AST: 26 U/L (ref 0–37)
Albumin: 3.5 g/dL (ref 3.5–5.2)
Anion gap: 12 (ref 5–15)
BUN: 13 mg/dL (ref 6–23)
CO2: 24 mEq/L (ref 19–32)
Calcium: 9.3 mg/dL (ref 8.4–10.5)
Chloride: 101 mEq/L (ref 96–112)
Creatinine, Ser: 0.73 mg/dL (ref 0.50–1.10)
GFR calc Af Amer: >90 mL/min (ref >90)
GFR calc non Af Amer: 90 mL/min — ABNORMAL LOW (ref >90)
Glucose, Bld: 179 mg/dL — ABNORMAL HIGH (ref 70–99)
POTASSIUM: 4.6 meq/L (ref 3.7–5.3)
Sodium: 137 mEq/L (ref 137–147)
TOTAL PROTEIN: 6.9 g/dL (ref 6.0–8.3)
Total Bilirubin: 0.2 mg/dL — ABNORMAL LOW (ref 0.3–1.2)

## 2013-12-11 LAB — GLUCOSE, CAPILLARY: GLUCOSE-CAPILLARY: 157 mg/dL — AB (ref 70–99)

## 2013-12-11 NOTE — Progress Notes (Signed)
NURSING PROGRESS NOTE  [name] [id number] Discharge Data: 12/11/2013 11:15 AM Attending Provider: Verlee Monte, MD GUY:[id number],VZDGLO [name], MD     [name] to be D/C'd Home per MD order.  Discussed with the patient the After Visit Summary and all questions fully answered. All IV's discontinued with no bleeding noted. All belongings returned to patient for patient to take home.   Last Vital Signs:  Blood pressure 105/68, pulse 75, temperature 98.2 F (36.8 C), temperature source Oral, resp. rate 18, height 5\' 4"  (1.626 m), weight 77.111 kg (170 lb), SpO2 96.00%.  Discharge Medication List   Medication List         aspirin 81 MG tablet  Take 81 mg by mouth daily.     levothyroxine 125 MCG tablet  Commonly known as:  SYNTHROID, LEVOTHROID  Take 125 mcg by mouth Daily.     pantoprazole 40 MG tablet  Commonly known as:  PROTONIX  Take 40 mg by mouth daily.     ramipril 2.5 MG capsule  Commonly known as:  ALTACE  Take 2.5 mg by mouth daily.     repaglinide 0.5 MG tablet  Commonly known as:  PRANDIN  Take 0.5 mg by mouth 2 (two) times daily before a meal.     simvastatin 10 MG tablet  Commonly known as:  ZOCOR  Take 10 mg by mouth Daily.     tiZANidine 2 MG tablet  Commonly known as:  ZANAFLEX  Take 2-4 mg by mouth 3 (three) times daily. Take 1mg  every morning, 1mg  at noon, and 4mg  at bedtime.     TRULICITY [place]  Inject 1 each into the skin every 7 (seven) days. Every Friday

## 2013-12-11 NOTE — Progress Notes (Signed)
Plans for dischg noted--I agree.  KUB from yest (reviewed):  Mod amnt of stool scattered throughout colon, no obstrn, paucity of stool distally (no fecal impaction).  Meanwhile, pt's constipation yesterday has evolved into diarr this morning (w/out laxative admin)--this is pt's typical pattern.  She has c/o some discomfort in deep LLQ--on exam, no tympany, no tenderness; suspect sigmoid spasm. VSS.  Up and walking, NAD.  Labs show nl WBC and stable Hgb.  Pt was offered reassurance regarding nature of IBS and the fact that the chronic, recurring pattern of Sx which she has is very c/w that Dx.  Our current plan revolves around trying low-dose Imodium, so as to hopefully control diarrhea without leading to too much "overshoot" constipation.  I have left a voicemail w/ pt's primary GI, Dr. Wynetta Emery, outlining events of this hospitalization and I anticipate pt will f/u with him.  Cleotis Nipper, M.D. [phone]

## 2013-12-11 NOTE — Discharge Summary (Signed)
Physician Discharge Summary  Anniston Nellums TKZ:[id number] DOB: 1950/04/02 DOA: 12/06/2013  PCP: [name], MD  Admit date: 12/06/2013 Discharge date: 12/11/2013  Time spent: 40 minutes  Recommendations for Outpatient Follow-up:  1.   Discharge Diagnoses:  Principal Problem:   Abdominal pain Active Problems:   Hemiparesis and alteration of sensations as late effects of stroke   Crohn disease   Diabetes mellitus   Hypothyroidism   Discharge Condition: Stable  Diet recommendation: Carb modified diet  Filed Weights   12/06/13 1223 12/06/13 2003  Weight: 77.111 kg (170 lb) 77.111 kg (170 lb)    History of present illness:  Patient is a 63 year old female with history of hypertension, hyperlipidemia, Crohn's disease, diabetes, prior history of CVA with left-sided hemiparesis, presented with above symptoms. History was obtained from the patient who reported that she has abdominal pain, cramping, diffusely for the last 3 days associated with nausea, diarrhea. Patient had 6 episodes of watery diarrhea today and denied any hematochezia or melena. She does have a history of chronic diarrhea, reports that her last Crohn's flare was in July 2015 when she was admitted.  Hospital Course:   Abdominal pain/ Nausea and diarrhea without vomiting  -History of Crohn's disease and small bowel resection 20 years ago. -Last Crohn's flare 08/2013.  -No nausea, ++ Diarrhea, and very mild abdominal pain in morning of 10/14  -KUB and Abdominal CT negative evidence of bowel obstruction  -ESR and CRP within normal limits.  -Patient refused Flagyl stating that it gives her diarrhea. Refused steroids stating they cause hallucinations.  -Started on vancomycin and discontinued today, positive C. difficile - negative by PCR.  -C. difficile is positive in the GI panel is negative by PCR, GI recommended no treatment. -Probably her symptoms are secondary to IBS flare up rather than Crohn's  disease.  IBS -Per GI patient probably has IBS, patient goes through cycles of diarrhea and constipation. -Explained to patient in length this is not a curable condition, management might be frustrating sometimes. -Patient reports that she is been dealing with diarrhea/constipation for at least 20 years. -Today she is constipated, yet she still asking about Imodium, I indicated her about this. -Have asked her to take fiber supplements at home. She uses coconut oil when she is constipated. -KUB prior to discharge showed constipation, I have asked her to use her laxative of choice at home  Stroke/ Hemiparesis  -Stable.  -Continue aspirin   Diabetes mellitus  -Well controlled inpatient.  -A1c from 10/13 is 7.4  -Takes Dulaglutide and Prandin at home.  -Sliding scale insulin , who medication restarted at the time of discharge.  Hypothyroidism  -TSH within normal limits, at 2.35  -Continue Synthroid   Hyperlipidemia  -Continue statin.   HTN  -Ramipril continued.   Procedures:  None  Consultations:  Gastroenterology  Discharge Exam: Filed Vitals:   12/11/13 0543  BP: 105/68  Pulse: 75  Temp: 98.2 F (36.8 C)  Resp: 18   General: Alert and awake, oriented x3, not in any acute distress. HEENT: anicteric sclera, pupils reactive to light and accommodation, EOMI CVS: S1-S2 clear, no murmur rubs or gallops Chest: clear to auscultation bilaterally, no wheezing, rales or rhonchi Abdomen: soft nontender, nondistended, normal bowel sounds, no organomegaly Extremities: no cyanosis, clubbing or edema noted bilaterally Neuro: Cranial nerves II-XII intact, no focal neurological deficits  Discharge Instructions You were cared for by a hospitalist during your hospital stay. If you have any questions about your discharge medications or the  care you received while you were in the hospital after you are discharged, you can call the unit and asked to speak with the hospitalist on call  if the hospitalist that took care of you is not available. Once you are discharged, your primary care physician will handle any further medical issues. Please note that NO REFILLS for any discharge medications will be authorized once you are discharged, as it is imperative that you return to your primary care physician (or establish a relationship with a primary care physician if you do not have one) for your aftercare needs so that they can reassess your need for medications and monitor your lab values.  Discharge Instructions   Diet - low sodium heart healthy    Complete by:  As directed      Increase activity slowly    Complete by:  As directed           Current Discharge Medication List    CONTINUE these medications which have NOT CHANGED   Details  aspirin 81 MG tablet Take 81 mg by mouth daily.    Dulaglutide (TRULICITY [place]) Inject 1 each into the skin every 7 (seven) days. Every Friday    levothyroxine (SYNTHROID, LEVOTHROID) 125 MCG tablet Take 125 mcg by mouth Daily.     pantoprazole (PROTONIX) 40 MG tablet Take 40 mg by mouth daily.     ramipril (ALTACE) 2.5 MG capsule Take 2.5 mg by mouth daily.     repaglinide (PRANDIN) 0.5 MG tablet Take 0.5 mg by mouth 2 (two) times daily before a meal.     simvastatin (ZOCOR) 10 MG tablet Take 10 mg by mouth Daily.     tiZANidine (ZANAFLEX) 2 MG tablet Take 2-4 mg by mouth 3 (three) times daily. Take 48m every morning, 135mat noon, and 66m59mt bedtime.       Allergies  Allergen Reactions  . Codeine Other (See Comments)    Syncope.  . Morphine And Related Nausea And Vomiting and Other (See Comments)    Hallucinations.  . Baclofen Nausea Only and Other (See Comments)    dizziness  . Penicillins Other (See Comments)    Family history  . Sulfur Itching    Per patient happened years ago.   Follow-up Information   Follow up with JOHGarlan FairD In 1 week.   Specialty:  Gastroenterology   Contact information:   [address] [place] [phone]     The results of significant diagnostics from this hospitalization (including imaging, microbiology, ancillary and laboratory) are listed below for reference.    Significant Diagnostic Studies: Dg Abd 1 View  12/10/2013   CLINICAL DATA:  Subsequent evaluation for constipation diarrhea nausea vomiting abdominal pain for 2 days, personal history of small bowel resection and cholecystectomy  EXAM: ABDOMEN - 1 VIEW  COMPARISON:  12/06/2013  FINDINGS: There is stool throughout most of the colon consistent with constipation. There are no abnormally dilated loops of bowel to suggest obstruction. No abnormal opacities. Surgical clips noted right upper quadrant.  IMPRESSION: Constipation   Electronically Signed   By: RaySkipper ClicheD.   On: 12/10/2013 17:02   Ct Abdomen Pelvis W Contrast  12/06/2013   CLINICAL DATA:  Right abdominal pain, diarrhea, nausea  EXAM: CT ABDOMEN AND PELVIS WITH CONTRAST  TECHNIQUE: Multidetector CT imaging of the abdomen and pelvis was performed using the standard protocol following bolus administration of intravenous contrast.  CONTRAST:  170m OMNIPAQUE IOHEXOL 300 MG/ML  SOLN  COMPARISON:  09/06/2013  FINDINGS: Lower chest:  Lung bases are clear.  Hepatobiliary: Hepatic steatosis with focal fatty sparing.  Status post cholecystectomy. No intrahepatic or extrahepatic ductal dilatation.  Pancreas: Within normal limits.  Spleen: Within normal limits.  Adrenals/Urinary Tract: Adrenal glands are unremarkable.  7 mm lateral interpolar left renal cyst. Right kidney is unremarkable. No hydronephrosis.  Bladder is unremarkable.  Stomach/Bowel: Stomach is within normal limits.  Two small bowel resections in the right lower abdomen (series 2/image 67). No evidence of bowel obstruction.  Wall thickening involving a single loop of small bowel just proximal to a small bowel anastomosis (series 2/image 70; series 5/image 65). No  convincing mesenteric stranding/inflammatory changes.  Appendix is not discretely visualized and may be surgically absent.  Vascular/Lymphatic: Atherosclerotic calcifications of the abdominal aorta and branch vessels.  No suspicious abdominopelvic lymphadenopathy.  Reproductive: Uterus is unremarkable.  Bilateral ovaries are within normal limits.  Other: No abdominopelvic ascites.  Musculoskeletal: Degenerative changes of the visualized thoracolumbar spine.  IMPRESSION: Two prior small bowel resections in the right lower abdomen. No evidence of bowel obstruction.  Wall thickening involving a single loop of small bowel just proximal to a small bowel anastomosis. Although equivocal, mild active inflammatory Crohn's disease is possible. No evidence of complication.  Hepatic steatosis.   Electronically Signed   By: SJulian HyM.D.   On: 12/06/2013 19:04   Dg Abd Acute W/chest  12/06/2013   CLINICAL DATA:  Abdominal pain.  Crohn's disease.  EXAM: ACUTE ABDOMEN SERIES (ABDOMEN 2 VIEW & CHEST 1 VIEW)  COMPARISON:  06/24/2006  FINDINGS: There is no evidence of dilated bowel loops or free intraperitoneal air. No radiopaque calculi or other significant radiographic abnormality is seen. Surgical clips noted in the right upper quadrant.  Heart size and mediastinal contours are within normal limits. Both lungs are clear.  IMPRESSION: No acute findings.   Electronically Signed   By: JEarle GellM.D.   On: 12/06/2013 16:45    Microbiology: Recent Results (from the past 240 hour(s))  CLOSTRIDIUM DIFFICILE BY PCR     Status: None   Collection Time    12/08/13  7:30 PM      Result Value Ref Range Status   C difficile by pcr NEGATIVE  NEGATIVE Final     Labs: Basic Metabolic Panel:  Recent Labs Lab 12/06/13 1230 12/07/13 0548 12/11/13 0625  NA 140 141 137  K 4.0 3.8 4.6  CL 103 108 101  CO2 _0 GLUCOSE 119* 120* 179*  BUN _1 CREATININE 0.71 0.69 0.73  CALCIUM 9.3 8.3* 9.3   Liver  Function Tests:  Recent Labs Lab 12/06/13 1230 12/11/13 0625  AST 35 26  ALT 37* 26  ALKPHOS 60 49  BILITOT 0.6 0.2*  PROT 7.6 6.9  ALBUMIN 4.0 3.5    Recent Labs Lab 12/06/13 1230  LIPASE 38   No results found for this basename: AMMONIA,  in the last 168 hours CBC:  Recent Labs Lab 12/06/13 1230 12/07/13 0548 12/08/13 0623 12/11/13 0625  WBC 6.8 5.2 4.8 6.2  NEUTROABS 3.8  --   --   --   HGB 12.2 10.6* 10.8* 10.8*  HCT 37.8 33.5* 34.1* 35.3*  MCV 77.5* 78.5 77.0* 78.1  PLT 226 183 185 192   Cardiac Enzymes: No results found for this basename: CKTOTAL, CKMB, CKMBINDEX, TROPONINI,  in the last 168 hours BNP: BNP (last  3 results) No results found for this basename: PROBNP,  in the last 8760 hours CBG:  Recent Labs Lab 12/10/13 0809 12/10/13 1208 12/10/13 1716 12/10/13 2205 12/11/13 0758  GLUCAP 154* 131* 160* 151* 157*       Signed:  Jaekwon Mcclune A  [facility] 12/11/2013, 10:45 AM

## 2013-12-12 NOTE — Care Management Note (Signed)
    Page 1 of 1   12/12/2013     6:24:52 PM CARE MANAGEMENT NOTE 12/12/2013  Patient:  [name], [name]   Account Number:  [id number]  Date Initiated:  12/12/2013  Documentation initiated by:  Tomi Bamberger  Subjective/Objective Assessment:   dx abd pain     Action/Plan:   Anticipated DC Date:  12/11/2013   Anticipated DC Plan:  [place]  CM consult      Choice offered to / List presented to:             Status of service:  Completed, signed off Medicare Important Message given?  YES (If response is "NO", the following Medicare IM given date fields will be blank) Date Medicare IM given:  12/09/2013 Medicare IM given by:  Tomi Bamberger Date Additional Medicare IM given:   Additional Medicare IM given by:    Discharge Disposition:  HOME/SELF CARE  Per UR Regulation:  Reviewed for med. necessity/level of care/duration of stay  If discussed at [place] of Stay Meetings, dates discussed:    Comments:

## 2013-12-22 ENCOUNTER — Other Ambulatory Visit: Payer: Self-pay | Admitting: Gastroenterology

## 2013-12-22 DIAGNOSIS — K5000 Crohn's disease of small intestine without complications: Secondary | ICD-10-CM

## 2013-12-23 ENCOUNTER — Other Ambulatory Visit: Payer: Medicare Other

## 2013-12-30 ENCOUNTER — Ambulatory Visit
Admission: RE | Admit: 2013-12-30 | Discharge: 2013-12-30 | Disposition: A | Discharge status: Home / Self Care | Payer: Medicare Other | Source: Ambulatory Visit | Attending: Gastroenterology | Admitting: Gastroenterology

## 2013-12-30 ENCOUNTER — Encounter: Payer: Self-pay | Admitting: Physical Medicine & Rehabilitation

## 2013-12-30 DIAGNOSIS — K5000 Crohn's disease of small intestine without complications: Secondary | ICD-10-CM

## 2014-02-03 ENCOUNTER — Ambulatory Visit (HOSPITAL_BASED_OUTPATIENT_CLINIC_OR_DEPARTMENT_OTHER): Payer: Medicare Other | Admitting: Physical Medicine & Rehabilitation

## 2014-02-03 ENCOUNTER — Encounter: Payer: Self-pay | Admitting: Physical Medicine & Rehabilitation

## 2014-02-03 ENCOUNTER — Encounter: Payer: Medicare Other | Attending: Physical Medicine & Rehabilitation

## 2014-02-03 VITALS — BP 124/78 | HR 74 | Resp 14 | Ht 64.0 in | Wt 168.2 lb

## 2014-02-03 DIAGNOSIS — Z87891 Personal history of nicotine dependence: Secondary | ICD-10-CM | POA: Diagnosis not present

## 2014-02-03 DIAGNOSIS — E119 Type 2 diabetes mellitus without complications: Secondary | ICD-10-CM | POA: Insufficient documentation

## 2014-02-03 DIAGNOSIS — G8110 Spastic hemiplegia affecting unspecified side: Secondary | ICD-10-CM | POA: Insufficient documentation

## 2014-02-03 DIAGNOSIS — K219 Gastro-esophageal reflux disease without esophagitis: Secondary | ICD-10-CM | POA: Diagnosis not present

## 2014-02-03 DIAGNOSIS — I10 Essential (primary) hypertension: Secondary | ICD-10-CM | POA: Insufficient documentation

## 2014-02-03 DIAGNOSIS — E039 Hypothyroidism, unspecified: Secondary | ICD-10-CM | POA: Diagnosis not present

## 2014-02-03 DIAGNOSIS — E785 Hyperlipidemia, unspecified: Secondary | ICD-10-CM | POA: Diagnosis not present

## 2014-02-03 NOTE — Patient Instructions (Signed)
Botox to wrist and finger flexors  Also may benefit from Nerve block Left musculocutaneous nerve block for elbow flexor spasticity  Left tibial nerve block for ankle inversion and toe flexion spasticity

## 2014-02-03 NOTE — Progress Notes (Signed)
Subjective:    Patient ID: [name], female    DOB: Jul 02, 1950, 63 y.o.   MRN: [id number]  HPI 63 year old female with history of right temporal occipital infarct and chronic left sided weakness with spasticity. Initial stroke was in March 2009. Was in the inpatient rehabilitation unit at [facility] from April 1 through 06/17/2007. Initially did home health therapy and then did outpatient therapy. Also has persistent homonymous hemianopsia. Has been followed by neurology and  Has been treated with botulinum toxin injections for spasticity. Recent neurology notes reviewed. Patient had following regimen in June 2015 Left pectoralis major 35 units Left biceps 40 units Left pronator teres 20 units Left FDP 20 units Left FDS 15 units Left posterior tibial 40 units  11/11/2013 Botox injection Left pectoralis major 35 units Left biceps 60 units Left pronators teres 30 units Left FDP 40 units Left FDS 20 units Left posterior tibial  40 units Total 225 units Wears left double metal upright AFO with posterior channels  Patient finished doctorate degree after stroke, is employed part-time at the current time. Very active and motivated Pain Inventory Average Pain 5 Pain Right Now 3 My pain is intermittent, stabbing and aching  In the last 24 hours, has pain interfered with the following? General activity 2 Relation with others 2 Enjoyment of life 2 What TIME of day is your pain at its worst? . Sleep (in general) Good  Pain is worse with: unsure Pain improves with: n/a Relief from Meds: 0  Mobility use a walker how many minutes can you walk? 20 ability to climb steps?  yes  Function disabled: date disabled .  Neuro/Psych bowel control problems weakness tremor tingling trouble walking spasms depression  Prior Studies Any changes since last visit?  no  Physicians involved in your care Any changes since last visit?  no   Family History  Problem Relation Age of  Onset  . Heart disease Mother   . Heart disease Father   . Diabetes Brother   . Diabetes Brother   . Migraines Son    History   Social History  . Marital Status: Married    Spouse Name: [name]    Number of Children: 2  . 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 Smoking status: Former Smoker -- 1.00 packs/day for 20 years    Types: Cigarettes    Quit date: 04/16/1993  . Smokeless tobacco: Never Used  . Alcohol Use: Yes     Comment: "haven't drank since early 1980's"  . Drug Use: No  . Sexual Activity: Yes   Other Topics Concern  . None   Social History Narrative   Patient is married [name]), has 2 children   Patient is right handed   Education level is Doctorate   Caffeine consumption is rarely, drinks decaf   Past Surgical History  Procedure Laterality Date  . Cesarean section  1981; 1983  . Cholecystectomy  1986  . Small intestine surgery  05/1991  . Anal fissure repair  2008  . Appendectomy  1986  . Breast lumpectomy Right 1990's  . Tubal ligation  1983   Past Medical History  Diagnosis Date  . GERD (gastroesophageal reflux disease)   . Hyperlipidemia   . Hypertension   . Constipation   . Bruises easily   . Lump in female breast   . Hemiparesis and alteration of sensations as late effects of stroke  01/26/2013  . Abnormality of gait 01/26/2013  . Dizziness and giddiness 01/26/2013  . Crohn's disease   . Depression   . Panic disorder   . Hemianopia     left  . PONV (postoperative nausea and vomiting)   . Stroke 2009  . Hypothyroidism   . Type II diabetes mellitus    BP 124/78 mmHg  Pulse 74  Resp 14  Ht 5' 4"  (1.626 m)  Wt 168 lb 3.2 oz (76.295 kg)  BMI 28.86 kg/m2  SpO2 97%  Opioid Risk Score:   Fall Risk Score: Low Fall Risk (0-5 points)  Review of Systems  HENT: Negative.   Eyes: Negative.   Respiratory: Negative.   Cardiovascular: Negative.   Gastrointestinal: Positive  for diarrhea and constipation.       Bowel control problems  Endocrine: Negative.   Genitourinary: Negative.   Musculoskeletal: Negative.   Skin: Negative.   Allergic/Immunologic: Negative.   Neurological: Positive for tremors.       Trouble walking, spasms, tingling  Hematological: Negative.   Psychiatric/Behavioral: Positive for dysphoric mood.       Objective:   Physical Exam  Motor strength 3 minus Left deltoid, 2 minus at the biceps, 0 at the finger extensors, trace at the finger and wrist flexors 4/5 left hip flexor and knee extensor, trace ankle dorsiflexor plantar flexor Right side 5/5 Modified Ashworth score 3-4 left bicep, 3 left wrist flexors, 3 left finger flexors  Patient ambulates with a straight cane as well as double metal upright AFO with posterior channel. Good toe clearance. Mild foot inversion within the brace.  Left foot with brace off inverted position Hyperactive Babinski Increased plantar flexor tone. Callus under 5th MT  dense left visual field to confrontation testing.         Assessment & Plan:  1. Spastic hemiplegia secondary to right temporal occipital infarct in 2009. Also has mild hemisensory deficits as well as homonymous hemianopsia  Botox  300 units  FDP- 50 FDS- 50 FCR-50 FCU-50 PT- 25 Pec-75  May need to titrate upward to 400 units After next injection, Depending on results  Also discussed tibial nerve  block for plantar flexor, toe flexor and foot invertor spasticity, Would try Marcaine first and if helpful proceed to phenol  Also discussed muscular cutaneous nerve block for left elbow flexor spasticity would try Marcaine first and if helpful proceed to phenol  Discussed the nerve blocks in more detail with the patient answered questions

## 2014-03-10 ENCOUNTER — Encounter: Payer: Commercial Managed Care - HMO | Attending: Physical Medicine & Rehabilitation

## 2014-03-10 ENCOUNTER — Encounter: Payer: Self-pay | Admitting: Physical Medicine & Rehabilitation

## 2014-03-10 ENCOUNTER — Ambulatory Visit (HOSPITAL_BASED_OUTPATIENT_CLINIC_OR_DEPARTMENT_OTHER): Payer: Medicare HMO | Admitting: Physical Medicine & Rehabilitation

## 2014-03-10 VITALS — BP 146/80 | HR 78 | Resp 14

## 2014-03-10 DIAGNOSIS — G8110 Spastic hemiplegia affecting unspecified side: Secondary | ICD-10-CM

## 2014-03-10 DIAGNOSIS — Z87891 Personal history of nicotine dependence: Secondary | ICD-10-CM | POA: Insufficient documentation

## 2014-03-10 DIAGNOSIS — K219 Gastro-esophageal reflux disease without esophagitis: Secondary | ICD-10-CM | POA: Insufficient documentation

## 2014-03-10 DIAGNOSIS — I10 Essential (primary) hypertension: Secondary | ICD-10-CM | POA: Diagnosis not present

## 2014-03-10 DIAGNOSIS — E785 Hyperlipidemia, unspecified: Secondary | ICD-10-CM | POA: Insufficient documentation

## 2014-03-10 DIAGNOSIS — E119 Type 2 diabetes mellitus without complications: Secondary | ICD-10-CM | POA: Insufficient documentation

## 2014-03-10 DIAGNOSIS — E039 Hypothyroidism, unspecified: Secondary | ICD-10-CM | POA: Diagnosis not present

## 2014-03-10 NOTE — Progress Notes (Signed)
Botox Injection for spasticity using needle EMG guidance  Dilution: 50 Units/ml Indication: Severe spasticity which interferes with ADL,mobility and/or  hygiene and is unresponsive to medication management and other conservative care Informed consent was obtained after describing risks and benefits of the procedure with the patient. This includes bleeding, bruising, infection, excessive weakness, or medication side effects. A REMS form is on file and signed. Needle: 25g 2" needle electrode Number of units per muscle FDP- 50 FDS- 50 FCR-50 FCU-50 PT- 25 Pec-75 All injections were done after obtaining appropriate EMG activity and after negative drawback for blood. The patient tolerated the procedure well. Post procedure instructions were given. A followup appointment was made.

## 2014-03-10 NOTE — Patient Instructions (Signed)

## 2014-03-20 ENCOUNTER — Other Ambulatory Visit: Payer: Self-pay

## 2014-03-20 ENCOUNTER — Other Ambulatory Visit: Payer: Self-pay | Admitting: Neurology

## 2014-03-24 DIAGNOSIS — K083 Retained dental root: Secondary | ICD-10-CM | POA: Diagnosis not present

## 2014-03-24 DIAGNOSIS — R69 Illness, unspecified: Secondary | ICD-10-CM | POA: Diagnosis not present

## 2014-03-31 ENCOUNTER — Ambulatory Visit: Payer: Medicare HMO | Admitting: Physical Medicine & Rehabilitation

## 2014-04-14 ENCOUNTER — Ambulatory Visit (HOSPITAL_BASED_OUTPATIENT_CLINIC_OR_DEPARTMENT_OTHER): Payer: Medicare HMO | Admitting: Physical Medicine & Rehabilitation

## 2014-04-14 ENCOUNTER — Encounter: Payer: Self-pay | Admitting: Physical Medicine & Rehabilitation

## 2014-04-14 ENCOUNTER — Encounter: Payer: Commercial Managed Care - HMO | Attending: Physical Medicine & Rehabilitation

## 2014-04-14 VITALS — BP 139/77 | HR 90 | Resp 14

## 2014-04-14 DIAGNOSIS — I10 Essential (primary) hypertension: Secondary | ICD-10-CM | POA: Insufficient documentation

## 2014-04-14 DIAGNOSIS — E785 Hyperlipidemia, unspecified: Secondary | ICD-10-CM | POA: Diagnosis not present

## 2014-04-14 DIAGNOSIS — E039 Hypothyroidism, unspecified: Secondary | ICD-10-CM | POA: Insufficient documentation

## 2014-04-14 DIAGNOSIS — Z87891 Personal history of nicotine dependence: Secondary | ICD-10-CM | POA: Diagnosis not present

## 2014-04-14 DIAGNOSIS — G8110 Spastic hemiplegia affecting unspecified side: Secondary | ICD-10-CM | POA: Insufficient documentation

## 2014-04-14 DIAGNOSIS — E119 Type 2 diabetes mellitus without complications: Secondary | ICD-10-CM | POA: Insufficient documentation

## 2014-04-14 DIAGNOSIS — K219 Gastro-esophageal reflux disease without esophagitis: Secondary | ICD-10-CM | POA: Insufficient documentation

## 2014-04-14 NOTE — Progress Notes (Signed)
Nerve block of the Left tibial nerve  Indication: Severe spasticity in the plantar flexor muscles which is not responding to medical management and other conservative care and interfering with functional use.  Informed consent was obtained after describing the risks and benefits of the procedure with the patient this includes bleeding bruising and infection as well as medication side effects. The patient elected to proceed and has given written consent. Patient placed in a prone position on the exam table. External DC stimulation was applied to the popliteal space using a nerve stimulator. Plantar flexion twitch was obtained. The popliteal region was prepped with Betadine and then entered with a 22-gauge 40 mm needle electrode under electrical stimulation guidance. Plantar flexion which was obtained and confirmed. Then 4 cc of 0.5% Marcaine were injected. The patient tolerated procedure well. Post procedure instructions and followup visit were given.

## 2014-04-14 NOTE — Patient Instructions (Signed)
Left tibial nerve block with Marcaine. This should last around 8 hours. This blocks the nerve that goes to the toe flexor muscles the ankle flexor muscles and the muscle that turns the foot inward. Test Drive this and if it is beneficial for you walking, then we will do phenol neural lysis.   In regards to Botox, this will wear off over the course of the next couple months. Subsequent Botox injections should reduce the dose to the finger flexors

## 2014-04-21 DIAGNOSIS — Z6829 Body mass index (BMI) 29.0-29.9, adult: Secondary | ICD-10-CM | POA: Diagnosis not present

## 2014-04-21 DIAGNOSIS — I69359 Hemiplegia and hemiparesis following cerebral infarction affecting unspecified side: Secondary | ICD-10-CM | POA: Diagnosis not present

## 2014-04-21 DIAGNOSIS — E119 Type 2 diabetes mellitus without complications: Secondary | ICD-10-CM | POA: Diagnosis not present

## 2014-04-21 DIAGNOSIS — E780 Pure hypercholesterolemia: Secondary | ICD-10-CM | POA: Diagnosis not present

## 2014-04-21 DIAGNOSIS — I10 Essential (primary) hypertension: Secondary | ICD-10-CM | POA: Diagnosis not present

## 2014-04-21 DIAGNOSIS — E039 Hypothyroidism, unspecified: Secondary | ICD-10-CM | POA: Diagnosis not present

## 2014-04-28 DIAGNOSIS — E119 Type 2 diabetes mellitus without complications: Secondary | ICD-10-CM | POA: Diagnosis not present

## 2014-04-28 DIAGNOSIS — B351 Tinea unguium: Secondary | ICD-10-CM | POA: Diagnosis not present

## 2014-05-12 ENCOUNTER — Encounter: Payer: Self-pay | Admitting: Physical Medicine & Rehabilitation

## 2014-05-12 ENCOUNTER — Encounter: Payer: Commercial Managed Care - HMO | Attending: Physical Medicine & Rehabilitation

## 2014-05-12 ENCOUNTER — Ambulatory Visit (HOSPITAL_BASED_OUTPATIENT_CLINIC_OR_DEPARTMENT_OTHER): Payer: Commercial Managed Care - HMO | Admitting: Physical Medicine & Rehabilitation

## 2014-05-12 VITALS — BP 140/78 | HR 100 | Resp 16

## 2014-05-12 DIAGNOSIS — K219 Gastro-esophageal reflux disease without esophagitis: Secondary | ICD-10-CM | POA: Insufficient documentation

## 2014-05-12 DIAGNOSIS — I10 Essential (primary) hypertension: Secondary | ICD-10-CM | POA: Insufficient documentation

## 2014-05-12 DIAGNOSIS — E780 Pure hypercholesterolemia: Secondary | ICD-10-CM | POA: Diagnosis not present

## 2014-05-12 DIAGNOSIS — E119 Type 2 diabetes mellitus without complications: Secondary | ICD-10-CM | POA: Diagnosis not present

## 2014-05-12 DIAGNOSIS — E039 Hypothyroidism, unspecified: Secondary | ICD-10-CM | POA: Diagnosis not present

## 2014-05-12 DIAGNOSIS — Z87891 Personal history of nicotine dependence: Secondary | ICD-10-CM | POA: Insufficient documentation

## 2014-05-12 DIAGNOSIS — G8110 Spastic hemiplegia affecting unspecified side: Secondary | ICD-10-CM | POA: Insufficient documentation

## 2014-05-12 DIAGNOSIS — E785 Hyperlipidemia, unspecified: Secondary | ICD-10-CM | POA: Insufficient documentation

## 2014-05-12 NOTE — Progress Notes (Signed)
Subjective:    Patient ID: [name], female    DOB: [birth year], 64 y.o.   MRN: [id number]  HPI  Temporary relief for about a day with the prior left tibial nerve block using Marcaine  Wishes to have phenol neural lysis  Pain Inventory Average Pain 2 Pain Right Now 2 My pain is intermittent, burning, tingling and aching  In the last 24 hours, has pain interfered with the following? General activity 4 Relation with others 4 Enjoyment of life 4 What TIME of day is your pain at its worst? night Sleep (in general) Fair  Pain is worse with: walking, standing and some activites Pain improves with: rest and medication Relief from Meds: 4  Mobility use a walker how many minutes can you walk? 10-15 ability to climb steps?  no  Function disabled: date disabled .  Neuro/Psych weakness tingling trouble walking spasms  Prior Studies Any changes since last visit?  no  Physicians involved in your care Any changes since last visit?  no   Family History  Problem Relation Age of Onset  . Heart disease Mother   . Heart disease Father   . Diabetes Brother   . Diabetes Brother   . Migraines Son    History   Social History  . Marital Status: Married    Spouse Name: [name]  . Number of Children: 2  . 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 Smoking status: Former Smoker -- 1.00 packs/day for 20 years    Types: Cigarettes    Quit date: 04/16/1993  . Smokeless tobacco: Never Used  . Alcohol Use: Yes     Comment: "haven't drank since early 1980's"  . Drug Use: No  . Sexual Activity: Yes   Other Topics Concern  . None   Social History Narrative   Patient is married [name]), has 2 children   Patient is right handed   Education level is Doctorate   Caffeine consumption is rarely, drinks decaf   Past Surgical History  Procedure Laterality Date  . Cesarean section  1981; 1983  . Cholecystectomy   1986  . Small intestine surgery  05/1991  . Anal fissure repair  2008  . Appendectomy  1986  . Breast lumpectomy Right 1990's  . Tubal ligation  1983   Past Medical History  Diagnosis Date  . GERD (gastroesophageal reflux disease)   . Hyperlipidemia   . Hypertension   . Constipation   . Bruises easily   . Lump in female breast   . Hemiparesis and alteration of sensations as late effects of stroke 01/26/2013  . Abnormality of gait 01/26/2013  . Dizziness and giddiness 01/26/2013  . Crohn's disease   . Depression   . Panic disorder   . Hemianopia     left  . PONV (postoperative nausea and vomiting)   . Stroke 2009  . Hypothyroidism   . Type II diabetes mellitus    BP 140/78 mmHg  Pulse 100  Resp 16  SpO2 97%  Opioid Risk Score:   Fall Risk Score: Low Fall Risk (0-5 points)`1  Depression screen PHQ 2/9  Depression screen PHQ 2/9 05/12/2014  Decreased Interest 1  Down, Depressed, Hopeless 0  PHQ - 2 Score 1  Altered sleeping 0  Tired, decreased energy 1  Change in appetite 0  Feeling bad or failure about yourself  0  Trouble concentrating 0  Moving slowly  or fidgety/restless 0  Suicidal thoughts 0  PHQ-9 Score 2      Review of Systems  HENT: Negative.   Eyes: Negative.   Respiratory: Negative.   Cardiovascular: Negative.   Gastrointestinal: Negative.   Endocrine: Negative.   Musculoskeletal:       Thumb, armpit pain  Skin: Negative.   Allergic/Immunologic: Negative.   Neurological: Positive for weakness.       Tingling, trouble walking, spasms  Hematological: Negative.   Psychiatric/Behavioral: Negative.        Objective:   Physical Exam  Equina varus spasticity left foot, Ashworth 3 at the plantar flexors      Assessment & Plan:  Phenol neurolysis of the Left tibial nerve  Indication: Severe spasticity in the plantar flexor muscles which is not responding to medical management and other conservative care and interfering with functional  use.  Informed consent was obtained after describing the risks and benefits of the procedure with the patient this includes bleeding bruising and infection as well as medication side effects. The patient elected to proceed and has given written consent. Patient placed in a prone position on the exam table. External DC stimulation was applied to the popliteal space using a nerve stimulator. Plantar flexion twitch was obtained. The popliteal region was prepped with Betadine and then entered with a 22-gauge 40 mm needle electrode under electrical stimulation guidance. Plantar flexion which was obtained and confirmed. Then 4 cc of 5% phenol were injected. The patient tolerated procedure well. Post procedure instructions and followup visit were given.

## 2014-05-12 NOTE — Patient Instructions (Signed)
Tibial nerve block with phenol today. This medication may start taking the fact today however full effect will be at about one week Duration of the effect is 3-6 months Side effects of medication may include right heel numbness or burning. Call if you have burning pain so we can recommend any medication for that.

## 2014-05-19 DIAGNOSIS — K50019 Crohn's disease of small intestine with unspecified complications: Secondary | ICD-10-CM | POA: Diagnosis not present

## 2014-05-19 DIAGNOSIS — R197 Diarrhea, unspecified: Secondary | ICD-10-CM | POA: Diagnosis not present

## 2014-05-19 DIAGNOSIS — R109 Unspecified abdominal pain: Secondary | ICD-10-CM | POA: Diagnosis not present

## 2014-05-19 DIAGNOSIS — K5900 Constipation, unspecified: Secondary | ICD-10-CM | POA: Diagnosis not present

## 2014-06-18 ENCOUNTER — Other Ambulatory Visit: Payer: Self-pay | Admitting: Neurology

## 2014-06-22 NOTE — Telephone Encounter (Signed)
Currently taking Zanaflex 2mg  tablet, (1/2 tab at 7am, 1/2 tab at 1pm and 2 tabs at 7pm)

## 2014-06-23 ENCOUNTER — Ambulatory Visit (HOSPITAL_BASED_OUTPATIENT_CLINIC_OR_DEPARTMENT_OTHER): Payer: Commercial Managed Care - HMO | Admitting: Physical Medicine & Rehabilitation

## 2014-06-23 ENCOUNTER — Encounter: Payer: Commercial Managed Care - HMO | Attending: Physical Medicine & Rehabilitation

## 2014-06-23 ENCOUNTER — Encounter: Payer: Self-pay | Admitting: Physical Medicine & Rehabilitation

## 2014-06-23 VITALS — BP 124/77 | HR 117 | Resp 16

## 2014-06-23 DIAGNOSIS — I10 Essential (primary) hypertension: Secondary | ICD-10-CM | POA: Insufficient documentation

## 2014-06-23 DIAGNOSIS — K219 Gastro-esophageal reflux disease without esophagitis: Secondary | ICD-10-CM | POA: Insufficient documentation

## 2014-06-23 DIAGNOSIS — G8110 Spastic hemiplegia affecting unspecified side: Secondary | ICD-10-CM | POA: Diagnosis not present

## 2014-06-23 DIAGNOSIS — E119 Type 2 diabetes mellitus without complications: Secondary | ICD-10-CM | POA: Insufficient documentation

## 2014-06-23 DIAGNOSIS — E039 Hypothyroidism, unspecified: Secondary | ICD-10-CM | POA: Diagnosis not present

## 2014-06-23 DIAGNOSIS — Z87891 Personal history of nicotine dependence: Secondary | ICD-10-CM | POA: Insufficient documentation

## 2014-06-23 DIAGNOSIS — E785 Hyperlipidemia, unspecified: Secondary | ICD-10-CM | POA: Insufficient documentation

## 2014-06-23 MED ORDER — TIZANIDINE HCL 2 MG PO TABS: 4.0000 mg | ORAL_TABLET | Freq: Three times a day (TID) | ORAL | Status: DC

## 2014-06-23 NOTE — Progress Notes (Addendum)
Dilution: 50 Units/ml Indication: Severe spasticity which interferes with ADL,mobility and/or  hygiene and is unresponsive to medication management and other conservative care Informed consent was obtained after describing risks and benefits of the procedure with the patient. This includes bleeding, bruising, infection, excessive weakness, or medication side effects. A REMS form is on file and signed. Needle: 25g 2" needle electrode Number of units per muscle LEFT FDP- 50 FDS- 50 FCR-50 FCU-50 PT- 25 Biceps-50 Brachiorad 25 All injections were done after obtaining appropriate EMG activity and after negative drawback for blood. The patient tolerated the procedure well. Post procedure instructions were given. A followup appointment was made.

## 2014-06-23 NOTE — Patient Instructions (Signed)

## 2014-06-30 DIAGNOSIS — K6289 Other specified diseases of anus and rectum: Secondary | ICD-10-CM | POA: Diagnosis not present

## 2014-06-30 DIAGNOSIS — K5900 Constipation, unspecified: Secondary | ICD-10-CM | POA: Diagnosis not present

## 2014-08-08 ENCOUNTER — Other Ambulatory Visit: Payer: Self-pay | Admitting: Internal Medicine

## 2014-08-08 DIAGNOSIS — Z1231 Encounter for screening mammogram for malignant neoplasm of breast: Secondary | ICD-10-CM

## 2014-08-11 ENCOUNTER — Ambulatory Visit: Payer: Self-pay | Admitting: Physical Medicine & Rehabilitation

## 2014-08-18 ENCOUNTER — Ambulatory Visit: Payer: Commercial Managed Care - HMO

## 2014-08-18 ENCOUNTER — Ambulatory Visit
Admission: RE | Admit: 2014-08-18 | Discharge: 2014-08-18 | Disposition: A | Discharge status: Home / Self Care | Payer: Commercial Managed Care - HMO | Source: Ambulatory Visit | Attending: Internal Medicine | Admitting: Internal Medicine

## 2014-08-18 ENCOUNTER — Other Ambulatory Visit: Payer: Self-pay | Admitting: Internal Medicine

## 2014-08-18 DIAGNOSIS — B351 Tinea unguium: Secondary | ICD-10-CM | POA: Diagnosis not present

## 2014-08-18 DIAGNOSIS — Z1231 Encounter for screening mammogram for malignant neoplasm of breast: Secondary | ICD-10-CM

## 2014-08-18 DIAGNOSIS — E119 Type 2 diabetes mellitus without complications: Secondary | ICD-10-CM | POA: Diagnosis not present

## 2014-08-21 ENCOUNTER — Other Ambulatory Visit: Payer: Self-pay

## 2014-08-25 DIAGNOSIS — M6530 Trigger finger, unspecified finger: Secondary | ICD-10-CM | POA: Diagnosis not present

## 2014-09-08 DIAGNOSIS — M65341 Trigger finger, right ring finger: Secondary | ICD-10-CM | POA: Diagnosis not present

## 2014-09-08 DIAGNOSIS — M79641 Pain in right hand: Secondary | ICD-10-CM | POA: Diagnosis not present

## 2014-10-06 DIAGNOSIS — M62838 Other muscle spasm: Secondary | ICD-10-CM | POA: Diagnosis not present

## 2014-10-06 DIAGNOSIS — N8184 Pelvic muscle wasting: Secondary | ICD-10-CM | POA: Diagnosis not present

## 2014-10-06 DIAGNOSIS — R278 Other lack of coordination: Secondary | ICD-10-CM | POA: Diagnosis not present

## 2014-10-13 DIAGNOSIS — N8184 Pelvic muscle wasting: Secondary | ICD-10-CM | POA: Diagnosis not present

## 2014-10-13 DIAGNOSIS — M62838 Other muscle spasm: Secondary | ICD-10-CM | POA: Diagnosis not present

## 2014-10-13 DIAGNOSIS — R278 Other lack of coordination: Secondary | ICD-10-CM | POA: Diagnosis not present

## 2014-10-20 DIAGNOSIS — E039 Hypothyroidism, unspecified: Secondary | ICD-10-CM | POA: Diagnosis not present

## 2014-10-20 DIAGNOSIS — E119 Type 2 diabetes mellitus without complications: Secondary | ICD-10-CM | POA: Diagnosis not present

## 2014-11-03 DIAGNOSIS — M62838 Other muscle spasm: Secondary | ICD-10-CM | POA: Diagnosis not present

## 2014-11-03 DIAGNOSIS — N8184 Pelvic muscle wasting: Secondary | ICD-10-CM | POA: Diagnosis not present

## 2014-11-03 DIAGNOSIS — R278 Other lack of coordination: Secondary | ICD-10-CM | POA: Diagnosis not present

## 2014-11-23 DIAGNOSIS — R278 Other lack of coordination: Secondary | ICD-10-CM | POA: Diagnosis not present

## 2014-11-23 DIAGNOSIS — Z8673 Personal history of transient ischemic attack (TIA), and cerebral infarction without residual deficits: Secondary | ICD-10-CM | POA: Diagnosis not present

## 2014-11-23 DIAGNOSIS — R279 Unspecified lack of coordination: Secondary | ICD-10-CM | POA: Diagnosis not present

## 2014-11-23 DIAGNOSIS — R232 Flushing: Secondary | ICD-10-CM | POA: Diagnosis not present

## 2014-11-23 DIAGNOSIS — I788 Other diseases of capillaries: Secondary | ICD-10-CM | POA: Diagnosis not present

## 2014-11-23 DIAGNOSIS — L820 Inflamed seborrheic keratosis: Secondary | ICD-10-CM | POA: Diagnosis not present

## 2014-11-23 DIAGNOSIS — L821 Other seborrheic keratosis: Secondary | ICD-10-CM | POA: Diagnosis not present

## 2014-11-23 DIAGNOSIS — M62838 Other muscle spasm: Secondary | ICD-10-CM | POA: Diagnosis not present

## 2014-11-23 DIAGNOSIS — L718 Other rosacea: Secondary | ICD-10-CM | POA: Diagnosis not present

## 2014-11-23 DIAGNOSIS — N8184 Pelvic muscle wasting: Secondary | ICD-10-CM | POA: Diagnosis not present

## 2014-11-27 DIAGNOSIS — B351 Tinea unguium: Secondary | ICD-10-CM | POA: Diagnosis not present

## 2014-11-27 DIAGNOSIS — E119 Type 2 diabetes mellitus without complications: Secondary | ICD-10-CM | POA: Diagnosis not present

## 2014-11-30 DIAGNOSIS — R278 Other lack of coordination: Secondary | ICD-10-CM | POA: Diagnosis not present

## 2014-11-30 DIAGNOSIS — N8184 Pelvic muscle wasting: Secondary | ICD-10-CM | POA: Diagnosis not present

## 2014-11-30 DIAGNOSIS — M62838 Other muscle spasm: Secondary | ICD-10-CM | POA: Diagnosis not present

## 2014-11-30 DIAGNOSIS — R279 Unspecified lack of coordination: Secondary | ICD-10-CM | POA: Diagnosis not present

## 2014-12-21 DIAGNOSIS — M65341 Trigger finger, right ring finger: Secondary | ICD-10-CM | POA: Diagnosis not present

## 2014-12-22 DIAGNOSIS — M62838 Other muscle spasm: Secondary | ICD-10-CM | POA: Diagnosis not present

## 2014-12-22 DIAGNOSIS — N8184 Pelvic muscle wasting: Secondary | ICD-10-CM | POA: Diagnosis not present

## 2014-12-22 DIAGNOSIS — R279 Unspecified lack of coordination: Secondary | ICD-10-CM | POA: Diagnosis not present

## 2014-12-22 DIAGNOSIS — R278 Other lack of coordination: Secondary | ICD-10-CM | POA: Diagnosis not present

## 2014-12-29 DIAGNOSIS — N8184 Pelvic muscle wasting: Secondary | ICD-10-CM | POA: Diagnosis not present

## 2014-12-29 DIAGNOSIS — M65341 Trigger finger, right ring finger: Secondary | ICD-10-CM | POA: Diagnosis not present

## 2014-12-29 DIAGNOSIS — M62838 Other muscle spasm: Secondary | ICD-10-CM | POA: Diagnosis not present

## 2014-12-29 DIAGNOSIS — R278 Other lack of coordination: Secondary | ICD-10-CM | POA: Diagnosis not present

## 2014-12-29 DIAGNOSIS — R279 Unspecified lack of coordination: Secondary | ICD-10-CM | POA: Diagnosis not present

## 2015-01-12 DIAGNOSIS — M62838 Other muscle spasm: Secondary | ICD-10-CM | POA: Diagnosis not present

## 2015-01-12 DIAGNOSIS — R2681 Unsteadiness on feet: Secondary | ICD-10-CM | POA: Diagnosis not present

## 2015-01-12 DIAGNOSIS — N8184 Pelvic muscle wasting: Secondary | ICD-10-CM | POA: Diagnosis not present

## 2015-01-12 DIAGNOSIS — R279 Unspecified lack of coordination: Secondary | ICD-10-CM | POA: Diagnosis not present

## 2015-01-12 DIAGNOSIS — R278 Other lack of coordination: Secondary | ICD-10-CM | POA: Diagnosis not present

## 2015-02-01 DIAGNOSIS — R279 Unspecified lack of coordination: Secondary | ICD-10-CM | POA: Diagnosis not present

## 2015-02-01 DIAGNOSIS — N8184 Pelvic muscle wasting: Secondary | ICD-10-CM | POA: Diagnosis not present

## 2015-02-01 DIAGNOSIS — R278 Other lack of coordination: Secondary | ICD-10-CM | POA: Diagnosis not present

## 2015-02-01 DIAGNOSIS — M62838 Other muscle spasm: Secondary | ICD-10-CM | POA: Diagnosis not present

## 2015-02-01 DIAGNOSIS — R2681 Unsteadiness on feet: Secondary | ICD-10-CM | POA: Diagnosis not present

## 2015-02-02 DIAGNOSIS — E119 Type 2 diabetes mellitus without complications: Secondary | ICD-10-CM | POA: Diagnosis not present

## 2015-02-02 DIAGNOSIS — Z7984 Long term (current) use of oral hypoglycemic drugs: Secondary | ICD-10-CM | POA: Diagnosis not present

## 2015-02-02 DIAGNOSIS — E039 Hypothyroidism, unspecified: Secondary | ICD-10-CM | POA: Diagnosis not present

## 2015-02-09 DIAGNOSIS — R279 Unspecified lack of coordination: Secondary | ICD-10-CM | POA: Diagnosis not present

## 2015-02-09 DIAGNOSIS — M62838 Other muscle spasm: Secondary | ICD-10-CM | POA: Diagnosis not present

## 2015-02-09 DIAGNOSIS — N8184 Pelvic muscle wasting: Secondary | ICD-10-CM | POA: Diagnosis not present

## 2015-02-09 DIAGNOSIS — R2681 Unsteadiness on feet: Secondary | ICD-10-CM | POA: Diagnosis not present

## 2015-02-09 DIAGNOSIS — R278 Other lack of coordination: Secondary | ICD-10-CM | POA: Diagnosis not present

## 2015-02-16 DIAGNOSIS — R279 Unspecified lack of coordination: Secondary | ICD-10-CM | POA: Diagnosis not present

## 2015-02-16 DIAGNOSIS — N8184 Pelvic muscle wasting: Secondary | ICD-10-CM | POA: Diagnosis not present

## 2015-02-16 DIAGNOSIS — M62838 Other muscle spasm: Secondary | ICD-10-CM | POA: Diagnosis not present

## 2015-02-16 DIAGNOSIS — R2681 Unsteadiness on feet: Secondary | ICD-10-CM | POA: Diagnosis not present

## 2015-02-16 DIAGNOSIS — R278 Other lack of coordination: Secondary | ICD-10-CM | POA: Diagnosis not present

## 2015-02-23 DIAGNOSIS — R2681 Unsteadiness on feet: Secondary | ICD-10-CM | POA: Diagnosis not present

## 2015-02-23 DIAGNOSIS — R279 Unspecified lack of coordination: Secondary | ICD-10-CM | POA: Diagnosis not present

## 2015-02-23 DIAGNOSIS — R278 Other lack of coordination: Secondary | ICD-10-CM | POA: Diagnosis not present

## 2015-02-23 DIAGNOSIS — N8184 Pelvic muscle wasting: Secondary | ICD-10-CM | POA: Diagnosis not present

## 2015-02-23 DIAGNOSIS — M62838 Other muscle spasm: Secondary | ICD-10-CM | POA: Diagnosis not present

## 2015-03-02 DIAGNOSIS — M62838 Other muscle spasm: Secondary | ICD-10-CM | POA: Diagnosis not present

## 2015-03-02 DIAGNOSIS — R2681 Unsteadiness on feet: Secondary | ICD-10-CM | POA: Diagnosis not present

## 2015-03-02 DIAGNOSIS — R279 Unspecified lack of coordination: Secondary | ICD-10-CM | POA: Diagnosis not present

## 2015-03-02 DIAGNOSIS — R278 Other lack of coordination: Secondary | ICD-10-CM | POA: Diagnosis not present

## 2015-03-02 DIAGNOSIS — N8184 Pelvic muscle wasting: Secondary | ICD-10-CM | POA: Diagnosis not present

## 2015-03-18 ENCOUNTER — Other Ambulatory Visit: Payer: Self-pay | Admitting: Physical Medicine & Rehabilitation

## 2015-03-19 DIAGNOSIS — B351 Tinea unguium: Secondary | ICD-10-CM | POA: Diagnosis not present

## 2015-03-19 DIAGNOSIS — E119 Type 2 diabetes mellitus without complications: Secondary | ICD-10-CM | POA: Diagnosis not present

## 2015-03-19 NOTE — Telephone Encounter (Signed)
Please advise on refill.

## 2015-03-23 DIAGNOSIS — R279 Unspecified lack of coordination: Secondary | ICD-10-CM | POA: Diagnosis not present

## 2015-03-23 DIAGNOSIS — N8184 Pelvic muscle wasting: Secondary | ICD-10-CM | POA: Diagnosis not present

## 2015-03-23 DIAGNOSIS — R278 Other lack of coordination: Secondary | ICD-10-CM | POA: Diagnosis not present

## 2015-03-23 DIAGNOSIS — M62838 Other muscle spasm: Secondary | ICD-10-CM | POA: Diagnosis not present

## 2015-03-23 DIAGNOSIS — R2681 Unsteadiness on feet: Secondary | ICD-10-CM | POA: Diagnosis not present

## 2015-04-20 DIAGNOSIS — R278 Other lack of coordination: Secondary | ICD-10-CM | POA: Diagnosis not present

## 2015-04-20 DIAGNOSIS — M6281 Muscle weakness (generalized): Secondary | ICD-10-CM | POA: Diagnosis not present

## 2015-04-20 DIAGNOSIS — R2681 Unsteadiness on feet: Secondary | ICD-10-CM | POA: Diagnosis not present

## 2015-04-20 DIAGNOSIS — N8184 Pelvic muscle wasting: Secondary | ICD-10-CM | POA: Diagnosis not present

## 2015-04-20 DIAGNOSIS — M62838 Other muscle spasm: Secondary | ICD-10-CM | POA: Diagnosis not present

## 2015-04-20 DIAGNOSIS — R5381 Other malaise: Secondary | ICD-10-CM | POA: Diagnosis not present

## 2015-04-20 DIAGNOSIS — R279 Unspecified lack of coordination: Secondary | ICD-10-CM | POA: Diagnosis not present

## 2015-04-27 ENCOUNTER — Other Ambulatory Visit: Payer: Self-pay | Admitting: Obstetrics & Gynecology

## 2015-04-27 ENCOUNTER — Other Ambulatory Visit (HOSPITAL_COMMUNITY)
Admission: RE | Admit: 2015-04-27 | Discharge: 2015-04-27 | Disposition: A | Discharge status: Home / Self Care | Payer: Commercial Managed Care - HMO | Source: Ambulatory Visit | Attending: Obstetrics & Gynecology | Admitting: Obstetrics & Gynecology

## 2015-04-27 DIAGNOSIS — Z113 Encounter for screening for infections with a predominantly sexual mode of transmission: Secondary | ICD-10-CM | POA: Diagnosis not present

## 2015-04-27 DIAGNOSIS — Z01411 Encounter for gynecological examination (general) (routine) with abnormal findings: Secondary | ICD-10-CM | POA: Diagnosis not present

## 2015-04-27 DIAGNOSIS — Z01419 Encounter for gynecological examination (general) (routine) without abnormal findings: Secondary | ICD-10-CM | POA: Insufficient documentation

## 2015-04-27 DIAGNOSIS — Z1151 Encounter for screening for human papillomavirus (HPV): Secondary | ICD-10-CM | POA: Diagnosis not present

## 2015-04-27 DIAGNOSIS — N941 Unspecified dyspareunia: Secondary | ICD-10-CM | POA: Diagnosis not present

## 2015-04-30 LAB — CYTOLOGY - PAP

## 2015-05-04 DIAGNOSIS — M62838 Other muscle spasm: Secondary | ICD-10-CM | POA: Diagnosis not present

## 2015-05-04 DIAGNOSIS — R278 Other lack of coordination: Secondary | ICD-10-CM | POA: Diagnosis not present

## 2015-05-04 DIAGNOSIS — R2681 Unsteadiness on feet: Secondary | ICD-10-CM | POA: Diagnosis not present

## 2015-05-04 DIAGNOSIS — N8184 Pelvic muscle wasting: Secondary | ICD-10-CM | POA: Diagnosis not present

## 2015-05-04 DIAGNOSIS — R279 Unspecified lack of coordination: Secondary | ICD-10-CM | POA: Diagnosis not present

## 2015-05-11 DIAGNOSIS — R279 Unspecified lack of coordination: Secondary | ICD-10-CM | POA: Diagnosis not present

## 2015-05-11 DIAGNOSIS — M62838 Other muscle spasm: Secondary | ICD-10-CM | POA: Diagnosis not present

## 2015-05-11 DIAGNOSIS — N8184 Pelvic muscle wasting: Secondary | ICD-10-CM | POA: Diagnosis not present

## 2015-05-11 DIAGNOSIS — R278 Other lack of coordination: Secondary | ICD-10-CM | POA: Diagnosis not present

## 2015-05-11 DIAGNOSIS — R2681 Unsteadiness on feet: Secondary | ICD-10-CM | POA: Diagnosis not present

## 2015-05-18 DIAGNOSIS — M62838 Other muscle spasm: Secondary | ICD-10-CM | POA: Diagnosis not present

## 2015-05-18 DIAGNOSIS — N8184 Pelvic muscle wasting: Secondary | ICD-10-CM | POA: Diagnosis not present

## 2015-05-18 DIAGNOSIS — R279 Unspecified lack of coordination: Secondary | ICD-10-CM | POA: Diagnosis not present

## 2015-05-18 DIAGNOSIS — R2681 Unsteadiness on feet: Secondary | ICD-10-CM | POA: Diagnosis not present

## 2015-05-18 DIAGNOSIS — R278 Other lack of coordination: Secondary | ICD-10-CM | POA: Diagnosis not present

## 2015-05-24 ENCOUNTER — Ambulatory Visit: Payer: Commercial Managed Care - HMO | Admitting: Physical Therapy

## 2015-05-25 DIAGNOSIS — R2681 Unsteadiness on feet: Secondary | ICD-10-CM | POA: Diagnosis not present

## 2015-05-25 DIAGNOSIS — N8184 Pelvic muscle wasting: Secondary | ICD-10-CM | POA: Diagnosis not present

## 2015-05-25 DIAGNOSIS — R278 Other lack of coordination: Secondary | ICD-10-CM | POA: Diagnosis not present

## 2015-05-25 DIAGNOSIS — R279 Unspecified lack of coordination: Secondary | ICD-10-CM | POA: Diagnosis not present

## 2015-05-25 DIAGNOSIS — M62838 Other muscle spasm: Secondary | ICD-10-CM | POA: Diagnosis not present

## 2015-05-31 DIAGNOSIS — I69359 Hemiplegia and hemiparesis following cerebral infarction affecting unspecified side: Secondary | ICD-10-CM | POA: Diagnosis not present

## 2015-05-31 DIAGNOSIS — R251 Tremor, unspecified: Secondary | ICD-10-CM | POA: Diagnosis not present

## 2015-05-31 DIAGNOSIS — E7800 Pure hypercholesterolemia, unspecified: Secondary | ICD-10-CM | POA: Diagnosis not present

## 2015-05-31 DIAGNOSIS — E119 Type 2 diabetes mellitus without complications: Secondary | ICD-10-CM | POA: Diagnosis not present

## 2015-05-31 DIAGNOSIS — I10 Essential (primary) hypertension: Secondary | ICD-10-CM | POA: Diagnosis not present

## 2015-05-31 DIAGNOSIS — Z0000 Encounter for general adult medical examination without abnormal findings: Secondary | ICD-10-CM | POA: Diagnosis not present

## 2015-05-31 DIAGNOSIS — E039 Hypothyroidism, unspecified: Secondary | ICD-10-CM | POA: Diagnosis not present

## 2015-05-31 DIAGNOSIS — Z1389 Encounter for screening for other disorder: Secondary | ICD-10-CM | POA: Diagnosis not present

## 2015-05-31 DIAGNOSIS — Z7984 Long term (current) use of oral hypoglycemic drugs: Secondary | ICD-10-CM | POA: Diagnosis not present

## 2015-06-18 DIAGNOSIS — B351 Tinea unguium: Secondary | ICD-10-CM | POA: Diagnosis not present

## 2015-06-18 DIAGNOSIS — E119 Type 2 diabetes mellitus without complications: Secondary | ICD-10-CM | POA: Diagnosis not present

## 2015-07-02 ENCOUNTER — Ambulatory Visit: Payer: Commercial Managed Care - HMO | Admitting: Physical Therapy

## 2015-07-09 ENCOUNTER — Ambulatory Visit: Payer: Commercial Managed Care - HMO | Admitting: Physical Therapy

## 2015-07-16 ENCOUNTER — Ambulatory Visit: Payer: Commercial Managed Care - HMO | Admitting: Physical Therapy

## 2015-08-06 ENCOUNTER — Other Ambulatory Visit: Payer: Self-pay | Admitting: Physical Medicine & Rehabilitation

## 2015-08-17 DIAGNOSIS — M21372 Foot drop, left foot: Secondary | ICD-10-CM | POA: Diagnosis not present

## 2015-08-31 DIAGNOSIS — M6289 Other specified disorders of muscle: Secondary | ICD-10-CM | POA: Diagnosis not present

## 2015-09-18 ENCOUNTER — Other Ambulatory Visit: Payer: Self-pay | Admitting: Physical Medicine & Rehabilitation

## 2015-09-24 DIAGNOSIS — B351 Tinea unguium: Secondary | ICD-10-CM | POA: Diagnosis not present

## 2015-09-24 DIAGNOSIS — E119 Type 2 diabetes mellitus without complications: Secondary | ICD-10-CM | POA: Diagnosis not present

## 2015-10-02 DIAGNOSIS — E119 Type 2 diabetes mellitus without complications: Secondary | ICD-10-CM | POA: Diagnosis not present

## 2015-10-02 DIAGNOSIS — Z713 Dietary counseling and surveillance: Secondary | ICD-10-CM | POA: Diagnosis not present

## 2015-10-02 DIAGNOSIS — K50919 Crohn's disease, unspecified, with unspecified complications: Secondary | ICD-10-CM | POA: Diagnosis not present

## 2015-10-22 DIAGNOSIS — E119 Type 2 diabetes mellitus without complications: Secondary | ICD-10-CM | POA: Diagnosis not present

## 2015-12-07 DIAGNOSIS — E039 Hypothyroidism, unspecified: Secondary | ICD-10-CM | POA: Diagnosis not present

## 2015-12-07 DIAGNOSIS — Z7984 Long term (current) use of oral hypoglycemic drugs: Secondary | ICD-10-CM | POA: Diagnosis not present

## 2015-12-07 DIAGNOSIS — E119 Type 2 diabetes mellitus without complications: Secondary | ICD-10-CM | POA: Diagnosis not present

## 2015-12-14 DIAGNOSIS — R69 Illness, unspecified: Secondary | ICD-10-CM | POA: Diagnosis not present

## 2015-12-14 DIAGNOSIS — K0263 Dental caries on smooth surface penetrating into pulp: Secondary | ICD-10-CM | POA: Diagnosis not present

## 2015-12-26 DIAGNOSIS — M778 Other enthesopathies, not elsewhere classified: Secondary | ICD-10-CM | POA: Diagnosis not present

## 2015-12-26 DIAGNOSIS — M65341 Trigger finger, right ring finger: Secondary | ICD-10-CM | POA: Diagnosis not present

## 2015-12-26 DIAGNOSIS — M1811 Unilateral primary osteoarthritis of first carpometacarpal joint, right hand: Secondary | ICD-10-CM | POA: Diagnosis not present

## 2015-12-26 DIAGNOSIS — M79641 Pain in right hand: Secondary | ICD-10-CM | POA: Diagnosis not present

## 2015-12-27 ENCOUNTER — Other Ambulatory Visit: Payer: Self-pay | Admitting: Physical Medicine & Rehabilitation

## 2015-12-30 ENCOUNTER — Other Ambulatory Visit: Payer: Self-pay | Admitting: Physical Medicine & Rehabilitation

## 2015-12-31 DIAGNOSIS — E119 Type 2 diabetes mellitus without complications: Secondary | ICD-10-CM | POA: Diagnosis not present

## 2015-12-31 DIAGNOSIS — B351 Tinea unguium: Secondary | ICD-10-CM | POA: Diagnosis not present

## 2016-01-04 DIAGNOSIS — K5900 Constipation, unspecified: Secondary | ICD-10-CM | POA: Diagnosis not present

## 2016-01-04 DIAGNOSIS — N8184 Pelvic muscle wasting: Secondary | ICD-10-CM | POA: Diagnosis not present

## 2016-02-29 DIAGNOSIS — I69359 Hemiplegia and hemiparesis following cerebral infarction affecting unspecified side: Secondary | ICD-10-CM | POA: Diagnosis not present

## 2016-02-29 DIAGNOSIS — I10 Essential (primary) hypertension: Secondary | ICD-10-CM | POA: Diagnosis not present

## 2016-02-29 DIAGNOSIS — R251 Tremor, unspecified: Secondary | ICD-10-CM | POA: Diagnosis not present

## 2016-02-29 DIAGNOSIS — K219 Gastro-esophageal reflux disease without esophagitis: Secondary | ICD-10-CM | POA: Diagnosis not present

## 2016-03-07 DIAGNOSIS — K5900 Constipation, unspecified: Secondary | ICD-10-CM | POA: Diagnosis not present

## 2016-03-07 DIAGNOSIS — N8184 Pelvic muscle wasting: Secondary | ICD-10-CM | POA: Diagnosis not present

## 2016-03-11 DIAGNOSIS — K5900 Constipation, unspecified: Secondary | ICD-10-CM | POA: Diagnosis not present

## 2016-03-11 DIAGNOSIS — N8184 Pelvic muscle wasting: Secondary | ICD-10-CM | POA: Diagnosis not present

## 2016-04-11 DIAGNOSIS — M65341 Trigger finger, right ring finger: Secondary | ICD-10-CM | POA: Diagnosis not present

## 2016-04-21 DIAGNOSIS — B351 Tinea unguium: Secondary | ICD-10-CM | POA: Diagnosis not present

## 2016-04-21 DIAGNOSIS — E119 Type 2 diabetes mellitus without complications: Secondary | ICD-10-CM | POA: Diagnosis not present

## 2016-04-29 DIAGNOSIS — G8112 Spastic hemiplegia affecting left dominant side: Secondary | ICD-10-CM | POA: Diagnosis not present

## 2016-04-29 DIAGNOSIS — I639 Cerebral infarction, unspecified: Secondary | ICD-10-CM | POA: Diagnosis not present

## 2016-04-29 DIAGNOSIS — I6350 Cerebral infarction due to unspecified occlusion or stenosis of unspecified cerebral artery: Secondary | ICD-10-CM | POA: Diagnosis not present

## 2016-04-29 DIAGNOSIS — I679 Cerebrovascular disease, unspecified: Secondary | ICD-10-CM | POA: Diagnosis not present

## 2016-05-27 DIAGNOSIS — R0683 Snoring: Secondary | ICD-10-CM | POA: Diagnosis not present

## 2016-05-27 DIAGNOSIS — I69359 Hemiplegia and hemiparesis following cerebral infarction affecting unspecified side: Secondary | ICD-10-CM | POA: Diagnosis not present

## 2016-05-27 DIAGNOSIS — Z8249 Family history of ischemic heart disease and other diseases of the circulatory system: Secondary | ICD-10-CM | POA: Diagnosis not present

## 2016-06-13 DIAGNOSIS — E119 Type 2 diabetes mellitus without complications: Secondary | ICD-10-CM | POA: Diagnosis not present

## 2016-06-13 DIAGNOSIS — E039 Hypothyroidism, unspecified: Secondary | ICD-10-CM | POA: Diagnosis not present

## 2016-06-13 DIAGNOSIS — Z5181 Encounter for therapeutic drug level monitoring: Secondary | ICD-10-CM | POA: Diagnosis not present

## 2016-06-13 DIAGNOSIS — Z7984 Long term (current) use of oral hypoglycemic drugs: Secondary | ICD-10-CM | POA: Diagnosis not present

## 2016-06-13 DIAGNOSIS — Z79899 Other long term (current) drug therapy: Secondary | ICD-10-CM | POA: Diagnosis not present

## 2016-07-03 DIAGNOSIS — R0681 Apnea, not elsewhere classified: Secondary | ICD-10-CM | POA: Diagnosis not present

## 2016-07-03 DIAGNOSIS — R0683 Snoring: Secondary | ICD-10-CM | POA: Diagnosis not present

## 2016-07-30 DIAGNOSIS — B351 Tinea unguium: Secondary | ICD-10-CM | POA: Diagnosis not present

## 2016-07-30 DIAGNOSIS — E119 Type 2 diabetes mellitus without complications: Secondary | ICD-10-CM | POA: Diagnosis not present

## 2016-09-12 DIAGNOSIS — I69359 Hemiplegia and hemiparesis following cerebral infarction affecting unspecified side: Secondary | ICD-10-CM | POA: Diagnosis not present

## 2016-09-12 DIAGNOSIS — I10 Essential (primary) hypertension: Secondary | ICD-10-CM | POA: Diagnosis not present

## 2016-09-12 DIAGNOSIS — Z1389 Encounter for screening for other disorder: Secondary | ICD-10-CM | POA: Diagnosis not present

## 2016-09-12 DIAGNOSIS — K5909 Other constipation: Secondary | ICD-10-CM | POA: Diagnosis not present

## 2016-09-12 DIAGNOSIS — Z0000 Encounter for general adult medical examination without abnormal findings: Secondary | ICD-10-CM | POA: Diagnosis not present

## 2016-11-07 DIAGNOSIS — X32XXXA Exposure to sunlight, initial encounter: Secondary | ICD-10-CM | POA: Diagnosis not present

## 2016-11-07 DIAGNOSIS — D2271 Melanocytic nevi of right lower limb, including hip: Secondary | ICD-10-CM | POA: Diagnosis not present

## 2016-11-07 DIAGNOSIS — L570 Actinic keratosis: Secondary | ICD-10-CM | POA: Diagnosis not present

## 2016-11-07 DIAGNOSIS — D225 Melanocytic nevi of trunk: Secondary | ICD-10-CM | POA: Diagnosis not present

## 2016-11-07 DIAGNOSIS — D2262 Melanocytic nevi of left upper limb, including shoulder: Secondary | ICD-10-CM | POA: Diagnosis not present

## 2016-11-07 DIAGNOSIS — D2272 Melanocytic nevi of left lower limb, including hip: Secondary | ICD-10-CM | POA: Diagnosis not present

## 2016-11-10 DIAGNOSIS — E119 Type 2 diabetes mellitus without complications: Secondary | ICD-10-CM | POA: Diagnosis not present

## 2016-11-10 DIAGNOSIS — B351 Tinea unguium: Secondary | ICD-10-CM | POA: Diagnosis not present

## 2016-12-15 DIAGNOSIS — K5909 Other constipation: Secondary | ICD-10-CM | POA: Diagnosis not present

## 2017-01-05 DIAGNOSIS — E039 Hypothyroidism, unspecified: Secondary | ICD-10-CM | POA: Diagnosis not present

## 2017-01-05 DIAGNOSIS — E119 Type 2 diabetes mellitus without complications: Secondary | ICD-10-CM | POA: Diagnosis not present

## 2017-01-05 DIAGNOSIS — Z5181 Encounter for therapeutic drug level monitoring: Secondary | ICD-10-CM | POA: Diagnosis not present

## 2017-01-05 DIAGNOSIS — E1165 Type 2 diabetes mellitus with hyperglycemia: Secondary | ICD-10-CM | POA: Diagnosis not present

## 2017-01-08 ENCOUNTER — Other Ambulatory Visit: Payer: Self-pay | Admitting: Gastroenterology

## 2017-01-08 ENCOUNTER — Ambulatory Visit
Admission: RE | Admit: 2017-01-08 | Discharge: 2017-01-08 | Disposition: A | Discharge status: Home / Self Care | Payer: Medicare HMO | Source: Ambulatory Visit | Attending: Gastroenterology | Admitting: Gastroenterology

## 2017-01-08 DIAGNOSIS — K5900 Constipation, unspecified: Secondary | ICD-10-CM

## 2017-01-08 DIAGNOSIS — K921 Melena: Secondary | ICD-10-CM | POA: Diagnosis not present

## 2017-02-09 DIAGNOSIS — E119 Type 2 diabetes mellitus without complications: Secondary | ICD-10-CM | POA: Diagnosis not present

## 2017-02-09 DIAGNOSIS — B351 Tinea unguium: Secondary | ICD-10-CM | POA: Diagnosis not present

## 2017-02-09 DIAGNOSIS — L851 Acquired keratosis [keratoderma] palmaris et plantaris: Secondary | ICD-10-CM | POA: Diagnosis not present

## 2017-04-13 ENCOUNTER — Ambulatory Visit: Payer: Self-pay | Admitting: *Deleted

## 2017-04-15 DIAGNOSIS — K5909 Other constipation: Secondary | ICD-10-CM | POA: Diagnosis not present

## 2017-04-15 DIAGNOSIS — G4485 Primary stabbing headache: Secondary | ICD-10-CM | POA: Diagnosis not present

## 2017-04-15 DIAGNOSIS — I10 Essential (primary) hypertension: Secondary | ICD-10-CM | POA: Diagnosis not present

## 2017-04-15 DIAGNOSIS — R252 Cramp and spasm: Secondary | ICD-10-CM | POA: Diagnosis not present

## 2017-04-20 DIAGNOSIS — K5900 Constipation, unspecified: Secondary | ICD-10-CM | POA: Diagnosis not present

## 2017-04-27 ENCOUNTER — Ambulatory Visit
Admission: RE | Admit: 2017-04-27 | Discharge: 2017-04-27 | Disposition: A | Discharge status: Home / Self Care | Payer: Medicare HMO | Source: Ambulatory Visit | Attending: Internal Medicine | Admitting: Internal Medicine

## 2017-04-27 ENCOUNTER — Other Ambulatory Visit: Payer: Self-pay | Admitting: Internal Medicine

## 2017-04-27 DIAGNOSIS — Z5181 Encounter for therapeutic drug level monitoring: Secondary | ICD-10-CM | POA: Diagnosis not present

## 2017-04-27 DIAGNOSIS — E039 Hypothyroidism, unspecified: Secondary | ICD-10-CM | POA: Diagnosis not present

## 2017-04-27 DIAGNOSIS — R52 Pain, unspecified: Secondary | ICD-10-CM

## 2017-04-27 DIAGNOSIS — E119 Type 2 diabetes mellitus without complications: Secondary | ICD-10-CM | POA: Diagnosis not present

## 2017-04-27 DIAGNOSIS — S59902A Unspecified injury of left elbow, initial encounter: Secondary | ICD-10-CM | POA: Diagnosis not present

## 2017-04-27 DIAGNOSIS — M25522 Pain in left elbow: Secondary | ICD-10-CM | POA: Diagnosis not present

## 2017-04-27 DIAGNOSIS — E1165 Type 2 diabetes mellitus with hyperglycemia: Secondary | ICD-10-CM | POA: Diagnosis not present

## 2017-05-05 DIAGNOSIS — S52125A Nondisplaced fracture of head of left radius, initial encounter for closed fracture: Secondary | ICD-10-CM | POA: Diagnosis not present

## 2017-05-05 DIAGNOSIS — S52022A Displaced fracture of olecranon process without intraarticular extension of left ulna, initial encounter for closed fracture: Secondary | ICD-10-CM | POA: Diagnosis not present

## 2017-05-05 DIAGNOSIS — S42435A Nondisplaced fracture (avulsion) of lateral epicondyle of left humerus, initial encounter for closed fracture: Secondary | ICD-10-CM | POA: Diagnosis not present

## 2017-05-05 DIAGNOSIS — W19XXXA Unspecified fall, initial encounter: Secondary | ICD-10-CM | POA: Diagnosis not present

## 2017-05-05 DIAGNOSIS — M25522 Pain in left elbow: Secondary | ICD-10-CM | POA: Diagnosis not present

## 2017-05-18 DIAGNOSIS — S52125D Nondisplaced fracture of head of left radius, subsequent encounter for closed fracture with routine healing: Secondary | ICD-10-CM | POA: Diagnosis not present

## 2017-05-18 DIAGNOSIS — B351 Tinea unguium: Secondary | ICD-10-CM | POA: Diagnosis not present

## 2017-05-18 DIAGNOSIS — S52022D Displaced fracture of olecranon process without intraarticular extension of left ulna, subsequent encounter for closed fracture with routine healing: Secondary | ICD-10-CM | POA: Diagnosis not present

## 2017-05-18 DIAGNOSIS — E119 Type 2 diabetes mellitus without complications: Secondary | ICD-10-CM | POA: Diagnosis not present

## 2017-06-10 DIAGNOSIS — S52125D Nondisplaced fracture of head of left radius, subsequent encounter for closed fracture with routine healing: Secondary | ICD-10-CM | POA: Diagnosis not present

## 2017-06-10 DIAGNOSIS — S52022D Displaced fracture of olecranon process without intraarticular extension of left ulna, subsequent encounter for closed fracture with routine healing: Secondary | ICD-10-CM | POA: Diagnosis not present

## 2017-07-07 ENCOUNTER — Other Ambulatory Visit: Payer: Self-pay | Admitting: Pharmacist

## 2017-07-07 NOTE — Patient Outreach (Signed)
[facility]) Care Management  07/07/2017  [name] 03-10-1950 [id number]   Patient was called regarding [employer] Pilot. HIPAA identifiers could not be obtained because the patient was unwilling to speak with me about the program.  Plan:  Close pharmacy episode and do not call the patient again.   Elayne Guerin, PharmD, [facility] Clinical Pharmacist [phone]

## 2017-07-22 DIAGNOSIS — H5213 Myopia, bilateral: Secondary | ICD-10-CM | POA: Diagnosis not present

## 2017-07-22 DIAGNOSIS — H53462 Homonymous bilateral field defects, left side: Secondary | ICD-10-CM | POA: Diagnosis not present

## 2017-07-22 DIAGNOSIS — E119 Type 2 diabetes mellitus without complications: Secondary | ICD-10-CM | POA: Diagnosis not present

## 2017-07-22 DIAGNOSIS — H52223 Regular astigmatism, bilateral: Secondary | ICD-10-CM | POA: Diagnosis not present

## 2017-07-22 DIAGNOSIS — H524 Presbyopia: Secondary | ICD-10-CM | POA: Diagnosis not present

## 2017-08-10 DIAGNOSIS — E119 Type 2 diabetes mellitus without complications: Secondary | ICD-10-CM | POA: Diagnosis not present

## 2017-08-10 DIAGNOSIS — B351 Tinea unguium: Secondary | ICD-10-CM | POA: Diagnosis not present

## 2017-08-12 DIAGNOSIS — K5900 Constipation, unspecified: Secondary | ICD-10-CM | POA: Diagnosis not present

## 2017-09-24 DIAGNOSIS — K5909 Other constipation: Secondary | ICD-10-CM | POA: Diagnosis not present

## 2017-09-24 DIAGNOSIS — G8929 Other chronic pain: Secondary | ICD-10-CM | POA: Diagnosis not present

## 2017-09-24 DIAGNOSIS — Z0000 Encounter for general adult medical examination without abnormal findings: Secondary | ICD-10-CM | POA: Diagnosis not present

## 2017-09-24 DIAGNOSIS — M25572 Pain in left ankle and joints of left foot: Secondary | ICD-10-CM | POA: Diagnosis not present

## 2017-09-24 DIAGNOSIS — Z1389 Encounter for screening for other disorder: Secondary | ICD-10-CM | POA: Diagnosis not present

## 2017-09-24 DIAGNOSIS — K219 Gastro-esophageal reflux disease without esophagitis: Secondary | ICD-10-CM | POA: Diagnosis not present

## 2017-09-24 DIAGNOSIS — I10 Essential (primary) hypertension: Secondary | ICD-10-CM | POA: Diagnosis not present

## 2017-09-24 DIAGNOSIS — I69359 Hemiplegia and hemiparesis following cerebral infarction affecting unspecified side: Secondary | ICD-10-CM | POA: Diagnosis not present

## 2017-09-30 DIAGNOSIS — H53462 Homonymous bilateral field defects, left side: Secondary | ICD-10-CM | POA: Diagnosis not present

## 2017-09-30 DIAGNOSIS — H04123 Dry eye syndrome of bilateral lacrimal glands: Secondary | ICD-10-CM | POA: Diagnosis not present

## 2017-09-30 DIAGNOSIS — Z7984 Long term (current) use of oral hypoglycemic drugs: Secondary | ICD-10-CM | POA: Diagnosis not present

## 2017-09-30 DIAGNOSIS — E119 Type 2 diabetes mellitus without complications: Secondary | ICD-10-CM | POA: Diagnosis not present

## 2017-10-19 DIAGNOSIS — E039 Hypothyroidism, unspecified: Secondary | ICD-10-CM | POA: Diagnosis not present

## 2017-10-19 DIAGNOSIS — Z23 Encounter for immunization: Secondary | ICD-10-CM | POA: Diagnosis not present

## 2017-10-19 DIAGNOSIS — Z5181 Encounter for therapeutic drug level monitoring: Secondary | ICD-10-CM | POA: Diagnosis not present

## 2017-10-19 DIAGNOSIS — E119 Type 2 diabetes mellitus without complications: Secondary | ICD-10-CM | POA: Diagnosis not present

## 2017-10-19 DIAGNOSIS — Z794 Long term (current) use of insulin: Secondary | ICD-10-CM | POA: Diagnosis not present

## 2017-11-04 DIAGNOSIS — K5900 Constipation, unspecified: Secondary | ICD-10-CM | POA: Diagnosis not present

## 2017-11-11 DIAGNOSIS — B351 Tinea unguium: Secondary | ICD-10-CM | POA: Diagnosis not present

## 2017-11-11 DIAGNOSIS — E119 Type 2 diabetes mellitus without complications: Secondary | ICD-10-CM | POA: Diagnosis not present

## 2017-11-30 ENCOUNTER — Ambulatory Visit: Payer: Medicare HMO | Admitting: Physical Therapy

## 2017-11-30 ENCOUNTER — Ambulatory Visit: Payer: Medicare HMO | Admitting: Occupational Therapy

## 2017-12-02 ENCOUNTER — Ambulatory Visit: Payer: Self-pay | Admitting: Physical Therapy

## 2017-12-02 ENCOUNTER — Encounter: Payer: Self-pay | Admitting: Occupational Therapy

## 2017-12-07 ENCOUNTER — Ambulatory Visit: Payer: Self-pay | Admitting: Physical Therapy

## 2017-12-07 ENCOUNTER — Encounter: Payer: Self-pay | Admitting: Occupational Therapy

## 2017-12-09 ENCOUNTER — Encounter: Payer: Self-pay | Admitting: Occupational Therapy

## 2017-12-09 ENCOUNTER — Ambulatory Visit: Payer: Self-pay | Admitting: Physical Therapy

## 2017-12-10 DIAGNOSIS — R159 Full incontinence of feces: Secondary | ICD-10-CM | POA: Diagnosis not present

## 2017-12-10 DIAGNOSIS — K50012 Crohn's disease of small intestine with intestinal obstruction: Secondary | ICD-10-CM | POA: Diagnosis not present

## 2017-12-10 DIAGNOSIS — R194 Change in bowel habit: Secondary | ICD-10-CM | POA: Diagnosis not present

## 2017-12-11 DIAGNOSIS — R159 Full incontinence of feces: Secondary | ICD-10-CM

## 2017-12-11 DIAGNOSIS — R194 Change in bowel habit: Secondary | ICD-10-CM | POA: Insufficient documentation

## 2017-12-11 DIAGNOSIS — K5900 Constipation, unspecified: Secondary | ICD-10-CM

## 2017-12-11 HISTORY — DX: Full incontinence of feces: K59.00

## 2017-12-11 HISTORY — DX: Constipation, unspecified: R15.9

## 2017-12-11 HISTORY — DX: Change in bowel habit: R19.4

## 2017-12-14 ENCOUNTER — Encounter: Payer: Self-pay | Admitting: Occupational Therapy

## 2017-12-14 ENCOUNTER — Ambulatory Visit: Payer: Self-pay | Admitting: Physical Therapy

## 2017-12-16 ENCOUNTER — Encounter: Payer: Self-pay | Admitting: Occupational Therapy

## 2017-12-16 ENCOUNTER — Ambulatory Visit: Payer: Self-pay | Admitting: Physical Therapy

## 2017-12-21 ENCOUNTER — Encounter: Payer: Self-pay | Admitting: Occupational Therapy

## 2017-12-21 ENCOUNTER — Ambulatory Visit: Payer: Self-pay | Admitting: Physical Therapy

## 2017-12-23 ENCOUNTER — Ambulatory Visit: Payer: Self-pay | Admitting: Physical Therapy

## 2017-12-23 ENCOUNTER — Encounter: Payer: Self-pay | Admitting: Occupational Therapy

## 2017-12-28 ENCOUNTER — Ambulatory Visit: Payer: Self-pay | Admitting: Physical Therapy

## 2017-12-30 ENCOUNTER — Ambulatory Visit: Payer: Self-pay | Admitting: Physical Therapy

## 2018-01-04 ENCOUNTER — Ambulatory Visit: Payer: Self-pay | Admitting: Physical Therapy

## 2018-01-06 ENCOUNTER — Ambulatory Visit: Payer: Self-pay | Admitting: Physical Therapy

## 2018-01-11 ENCOUNTER — Ambulatory Visit: Payer: Self-pay | Admitting: Physical Therapy

## 2018-01-13 ENCOUNTER — Ambulatory Visit: Payer: Self-pay | Admitting: Physical Therapy

## 2018-01-18 ENCOUNTER — Ambulatory Visit: Payer: Self-pay | Admitting: Physical Therapy

## 2018-01-20 ENCOUNTER — Ambulatory Visit: Payer: Self-pay | Admitting: Physical Therapy

## 2018-02-15 DIAGNOSIS — B351 Tinea unguium: Secondary | ICD-10-CM | POA: Diagnosis not present

## 2018-02-15 DIAGNOSIS — E119 Type 2 diabetes mellitus without complications: Secondary | ICD-10-CM | POA: Diagnosis not present

## 2018-04-02 DIAGNOSIS — K5909 Other constipation: Secondary | ICD-10-CM | POA: Diagnosis not present

## 2018-04-02 DIAGNOSIS — I69359 Hemiplegia and hemiparesis following cerebral infarction affecting unspecified side: Secondary | ICD-10-CM | POA: Diagnosis not present

## 2018-04-02 DIAGNOSIS — R1010 Upper abdominal pain, unspecified: Secondary | ICD-10-CM | POA: Diagnosis not present

## 2018-04-02 DIAGNOSIS — Z5181 Encounter for therapeutic drug level monitoring: Secondary | ICD-10-CM | POA: Diagnosis not present

## 2018-04-02 DIAGNOSIS — E119 Type 2 diabetes mellitus without complications: Secondary | ICD-10-CM | POA: Diagnosis not present

## 2018-04-02 DIAGNOSIS — E039 Hypothyroidism, unspecified: Secondary | ICD-10-CM | POA: Diagnosis not present

## 2018-04-02 DIAGNOSIS — I10 Essential (primary) hypertension: Secondary | ICD-10-CM | POA: Diagnosis not present

## 2018-04-02 DIAGNOSIS — Z794 Long term (current) use of insulin: Secondary | ICD-10-CM | POA: Diagnosis not present

## 2018-05-19 DIAGNOSIS — E119 Type 2 diabetes mellitus without complications: Secondary | ICD-10-CM | POA: Diagnosis not present

## 2018-05-19 DIAGNOSIS — B351 Tinea unguium: Secondary | ICD-10-CM | POA: Diagnosis not present

## 2018-06-09 DIAGNOSIS — N3941 Urge incontinence: Secondary | ICD-10-CM | POA: Diagnosis not present

## 2018-06-09 DIAGNOSIS — N9489 Other specified conditions associated with female genital organs and menstrual cycle: Secondary | ICD-10-CM | POA: Diagnosis not present

## 2018-06-09 DIAGNOSIS — K5902 Outlet dysfunction constipation: Secondary | ICD-10-CM | POA: Diagnosis not present

## 2018-06-09 DIAGNOSIS — R159 Full incontinence of feces: Secondary | ICD-10-CM | POA: Diagnosis not present

## 2018-06-29 DIAGNOSIS — E039 Hypothyroidism, unspecified: Secondary | ICD-10-CM | POA: Diagnosis not present

## 2018-06-29 DIAGNOSIS — Z5181 Encounter for therapeutic drug level monitoring: Secondary | ICD-10-CM | POA: Diagnosis not present

## 2018-06-29 DIAGNOSIS — E119 Type 2 diabetes mellitus without complications: Secondary | ICD-10-CM | POA: Diagnosis not present

## 2018-06-29 DIAGNOSIS — Z794 Long term (current) use of insulin: Secondary | ICD-10-CM | POA: Diagnosis not present

## 2018-07-28 DIAGNOSIS — E119 Type 2 diabetes mellitus without complications: Secondary | ICD-10-CM | POA: Diagnosis not present

## 2018-07-28 DIAGNOSIS — B351 Tinea unguium: Secondary | ICD-10-CM | POA: Diagnosis not present

## 2018-07-28 DIAGNOSIS — E1169 Type 2 diabetes mellitus with other specified complication: Secondary | ICD-10-CM | POA: Diagnosis not present

## 2018-08-18 DIAGNOSIS — R1031 Right lower quadrant pain: Secondary | ICD-10-CM | POA: Diagnosis not present

## 2018-10-01 DIAGNOSIS — R05 Cough: Secondary | ICD-10-CM | POA: Diagnosis not present

## 2018-10-01 DIAGNOSIS — Z1389 Encounter for screening for other disorder: Secondary | ICD-10-CM | POA: Diagnosis not present

## 2018-10-01 DIAGNOSIS — Z0000 Encounter for general adult medical examination without abnormal findings: Secondary | ICD-10-CM | POA: Diagnosis not present

## 2018-10-06 DIAGNOSIS — E119 Type 2 diabetes mellitus without complications: Secondary | ICD-10-CM | POA: Diagnosis not present

## 2018-10-06 DIAGNOSIS — B351 Tinea unguium: Secondary | ICD-10-CM | POA: Diagnosis not present

## 2018-11-24 DIAGNOSIS — Z9049 Acquired absence of other specified parts of digestive tract: Secondary | ICD-10-CM | POA: Diagnosis not present

## 2018-11-24 DIAGNOSIS — K50919 Crohn's disease, unspecified, with unspecified complications: Secondary | ICD-10-CM | POA: Diagnosis not present

## 2018-11-24 DIAGNOSIS — R109 Unspecified abdominal pain: Secondary | ICD-10-CM | POA: Diagnosis not present

## 2018-11-25 ENCOUNTER — Other Ambulatory Visit: Payer: Self-pay | Admitting: Gastroenterology

## 2018-11-25 DIAGNOSIS — R109 Unspecified abdominal pain: Secondary | ICD-10-CM

## 2018-12-03 ENCOUNTER — Other Ambulatory Visit: Payer: Self-pay | Admitting: Gastroenterology

## 2018-12-03 DIAGNOSIS — K50919 Crohn's disease, unspecified, with unspecified complications: Secondary | ICD-10-CM

## 2018-12-03 DIAGNOSIS — R109 Unspecified abdominal pain: Secondary | ICD-10-CM

## 2018-12-06 ENCOUNTER — Other Ambulatory Visit: Payer: Self-pay

## 2018-12-06 ENCOUNTER — Ambulatory Visit
Admission: RE | Admit: 2018-12-06 | Discharge: 2018-12-06 | Disposition: A | Discharge status: Home / Self Care | Payer: Medicare HMO | Source: Ambulatory Visit | Attending: Gastroenterology | Admitting: Gastroenterology

## 2018-12-06 DIAGNOSIS — K50919 Crohn's disease, unspecified, with unspecified complications: Secondary | ICD-10-CM | POA: Diagnosis not present

## 2018-12-06 DIAGNOSIS — R109 Unspecified abdominal pain: Secondary | ICD-10-CM | POA: Diagnosis not present

## 2018-12-06 DIAGNOSIS — K5900 Constipation, unspecified: Secondary | ICD-10-CM | POA: Diagnosis not present

## 2018-12-06 DIAGNOSIS — R197 Diarrhea, unspecified: Secondary | ICD-10-CM | POA: Diagnosis not present

## 2018-12-06 LAB — POCT I-STAT CREATININE: Creatinine, Ser: 0.8 mg/dL (ref 0.44–1.00)

## 2018-12-06 MED ORDER — IOHEXOL 300 MG/ML  SOLN
100.0000 mL | Freq: Once | INTRAMUSCULAR | Status: AC | PRN
  Administered 2018-12-06: 100 mL via INTRAVENOUS

## 2018-12-13 DIAGNOSIS — B351 Tinea unguium: Secondary | ICD-10-CM | POA: Diagnosis not present

## 2018-12-13 DIAGNOSIS — E119 Type 2 diabetes mellitus without complications: Secondary | ICD-10-CM | POA: Diagnosis not present

## 2018-12-15 DIAGNOSIS — R109 Unspecified abdominal pain: Secondary | ICD-10-CM | POA: Diagnosis not present

## 2018-12-15 DIAGNOSIS — Z9049 Acquired absence of other specified parts of digestive tract: Secondary | ICD-10-CM | POA: Diagnosis not present

## 2018-12-15 DIAGNOSIS — K50919 Crohn's disease, unspecified, with unspecified complications: Secondary | ICD-10-CM | POA: Diagnosis not present

## 2018-12-15 DIAGNOSIS — J439 Emphysema, unspecified: Secondary | ICD-10-CM | POA: Diagnosis not present

## 2019-01-31 DIAGNOSIS — I700 Atherosclerosis of aorta: Secondary | ICD-10-CM | POA: Diagnosis not present

## 2019-01-31 DIAGNOSIS — Z794 Long term (current) use of insulin: Secondary | ICD-10-CM | POA: Diagnosis not present

## 2019-01-31 DIAGNOSIS — E039 Hypothyroidism, unspecified: Secondary | ICD-10-CM | POA: Diagnosis not present

## 2019-01-31 DIAGNOSIS — E538 Deficiency of other specified B group vitamins: Secondary | ICD-10-CM | POA: Diagnosis not present

## 2019-01-31 DIAGNOSIS — Z5181 Encounter for therapeutic drug level monitoring: Secondary | ICD-10-CM | POA: Diagnosis not present

## 2019-01-31 DIAGNOSIS — E559 Vitamin D deficiency, unspecified: Secondary | ICD-10-CM | POA: Diagnosis not present

## 2019-01-31 DIAGNOSIS — K5909 Other constipation: Secondary | ICD-10-CM | POA: Diagnosis not present

## 2019-01-31 DIAGNOSIS — R1031 Right lower quadrant pain: Secondary | ICD-10-CM | POA: Diagnosis not present

## 2019-01-31 DIAGNOSIS — Z79899 Other long term (current) drug therapy: Secondary | ICD-10-CM | POA: Diagnosis not present

## 2019-01-31 DIAGNOSIS — Z23 Encounter for immunization: Secondary | ICD-10-CM | POA: Diagnosis not present

## 2019-01-31 DIAGNOSIS — I10 Essential (primary) hypertension: Secondary | ICD-10-CM | POA: Diagnosis not present

## 2019-01-31 DIAGNOSIS — E119 Type 2 diabetes mellitus without complications: Secondary | ICD-10-CM | POA: Diagnosis not present

## 2019-01-31 DIAGNOSIS — Z9889 Other specified postprocedural states: Secondary | ICD-10-CM | POA: Diagnosis not present

## 2019-02-02 DIAGNOSIS — Z8719 Personal history of other diseases of the digestive system: Secondary | ICD-10-CM | POA: Diagnosis not present

## 2019-02-10 DIAGNOSIS — B351 Tinea unguium: Secondary | ICD-10-CM | POA: Diagnosis not present

## 2019-02-10 DIAGNOSIS — E119 Type 2 diabetes mellitus without complications: Secondary | ICD-10-CM | POA: Diagnosis not present

## 2019-02-10 DIAGNOSIS — Z8719 Personal history of other diseases of the digestive system: Secondary | ICD-10-CM | POA: Diagnosis not present

## 2019-02-10 DIAGNOSIS — K5010 Crohn's disease of large intestine without complications: Secondary | ICD-10-CM | POA: Diagnosis not present

## 2019-02-28 DIAGNOSIS — G4485 Primary stabbing headache: Secondary | ICD-10-CM | POA: Diagnosis not present

## 2019-03-07 DIAGNOSIS — K50919 Crohn's disease, unspecified, with unspecified complications: Secondary | ICD-10-CM | POA: Diagnosis not present

## 2019-03-08 DIAGNOSIS — Z20828 Contact with and (suspected) exposure to other viral communicable diseases: Secondary | ICD-10-CM | POA: Diagnosis not present

## 2019-03-08 DIAGNOSIS — Z01812 Encounter for preprocedural laboratory examination: Secondary | ICD-10-CM | POA: Diagnosis not present

## 2019-03-11 DIAGNOSIS — Z87891 Personal history of nicotine dependence: Secondary | ICD-10-CM | POA: Diagnosis not present

## 2019-03-11 DIAGNOSIS — E039 Hypothyroidism, unspecified: Secondary | ICD-10-CM | POA: Diagnosis not present

## 2019-03-11 DIAGNOSIS — K9189 Other postprocedural complications and disorders of digestive system: Secondary | ICD-10-CM | POA: Diagnosis not present

## 2019-03-11 DIAGNOSIS — K6289 Other specified diseases of anus and rectum: Secondary | ICD-10-CM | POA: Diagnosis not present

## 2019-03-11 DIAGNOSIS — E119 Type 2 diabetes mellitus without complications: Secondary | ICD-10-CM | POA: Diagnosis not present

## 2019-03-11 DIAGNOSIS — Z794 Long term (current) use of insulin: Secondary | ICD-10-CM | POA: Diagnosis not present

## 2019-03-11 DIAGNOSIS — J449 Chronic obstructive pulmonary disease, unspecified: Secondary | ICD-10-CM | POA: Diagnosis not present

## 2019-03-11 DIAGNOSIS — Z8719 Personal history of other diseases of the digestive system: Secondary | ICD-10-CM | POA: Diagnosis not present

## 2019-03-11 DIAGNOSIS — K5090 Crohn's disease, unspecified, without complications: Secondary | ICD-10-CM | POA: Diagnosis not present

## 2019-03-11 DIAGNOSIS — K648 Other hemorrhoids: Secondary | ICD-10-CM | POA: Diagnosis not present

## 2019-03-11 DIAGNOSIS — K219 Gastro-esophageal reflux disease without esophagitis: Secondary | ICD-10-CM | POA: Diagnosis not present

## 2019-03-11 DIAGNOSIS — E785 Hyperlipidemia, unspecified: Secondary | ICD-10-CM | POA: Diagnosis not present

## 2019-03-11 DIAGNOSIS — Z980 Intestinal bypass and anastomosis status: Secondary | ICD-10-CM | POA: Diagnosis not present

## 2019-03-11 DIAGNOSIS — I10 Essential (primary) hypertension: Secondary | ICD-10-CM | POA: Diagnosis not present

## 2019-03-23 DIAGNOSIS — R194 Change in bowel habit: Secondary | ICD-10-CM | POA: Diagnosis not present

## 2019-03-23 DIAGNOSIS — K5900 Constipation, unspecified: Secondary | ICD-10-CM | POA: Diagnosis not present

## 2019-03-23 DIAGNOSIS — R159 Full incontinence of feces: Secondary | ICD-10-CM | POA: Diagnosis not present

## 2019-03-23 DIAGNOSIS — R109 Unspecified abdominal pain: Secondary | ICD-10-CM | POA: Diagnosis not present

## 2019-04-06 DIAGNOSIS — K5989 Other specified functional intestinal disorders: Secondary | ICD-10-CM | POA: Diagnosis not present

## 2019-04-15 DIAGNOSIS — E119 Type 2 diabetes mellitus without complications: Secondary | ICD-10-CM | POA: Diagnosis not present

## 2019-04-15 DIAGNOSIS — B351 Tinea unguium: Secondary | ICD-10-CM | POA: Diagnosis not present

## 2019-05-02 DIAGNOSIS — Z5181 Encounter for therapeutic drug level monitoring: Secondary | ICD-10-CM | POA: Diagnosis not present

## 2019-05-02 DIAGNOSIS — E119 Type 2 diabetes mellitus without complications: Secondary | ICD-10-CM | POA: Diagnosis not present

## 2019-05-02 DIAGNOSIS — E039 Hypothyroidism, unspecified: Secondary | ICD-10-CM | POA: Diagnosis not present

## 2019-05-02 DIAGNOSIS — Z794 Long term (current) use of insulin: Secondary | ICD-10-CM | POA: Diagnosis not present

## 2019-05-02 DIAGNOSIS — I69359 Hemiplegia and hemiparesis following cerebral infarction affecting unspecified side: Secondary | ICD-10-CM | POA: Diagnosis not present

## 2019-05-02 DIAGNOSIS — R197 Diarrhea, unspecified: Secondary | ICD-10-CM | POA: Diagnosis not present

## 2019-05-02 DIAGNOSIS — R5381 Other malaise: Secondary | ICD-10-CM | POA: Diagnosis not present

## 2019-05-02 DIAGNOSIS — Z9889 Other specified postprocedural states: Secondary | ICD-10-CM | POA: Diagnosis not present

## 2019-05-02 DIAGNOSIS — I69354 Hemiplegia and hemiparesis following cerebral infarction affecting left non-dominant side: Secondary | ICD-10-CM | POA: Diagnosis not present

## 2019-05-12 ENCOUNTER — Ambulatory Visit: Payer: Medicare HMO | Admitting: Physical Therapy

## 2019-05-13 DIAGNOSIS — M21372 Foot drop, left foot: Secondary | ICD-10-CM | POA: Diagnosis not present

## 2019-05-16 ENCOUNTER — Ambulatory Visit: Payer: Medicare HMO | Admitting: Physical Therapy

## 2019-05-16 ENCOUNTER — Encounter: Payer: Medicare HMO | Admitting: Physical Therapy

## 2019-05-17 ENCOUNTER — Ambulatory Visit: Payer: Medicare HMO | Admitting: Physical Therapy

## 2019-05-19 ENCOUNTER — Ambulatory Visit: Payer: Medicare HMO | Admitting: Physical Therapy

## 2019-05-19 ENCOUNTER — Encounter: Payer: Medicare HMO | Admitting: Physical Therapy

## 2019-05-19 DIAGNOSIS — M6281 Muscle weakness (generalized): Secondary | ICD-10-CM | POA: Diagnosis not present

## 2019-05-19 DIAGNOSIS — R2681 Unsteadiness on feet: Secondary | ICD-10-CM | POA: Diagnosis not present

## 2019-05-23 ENCOUNTER — Encounter: Payer: Medicare HMO | Admitting: Physical Therapy

## 2019-05-24 ENCOUNTER — Encounter: Payer: Medicare HMO | Admitting: Physical Therapy

## 2019-05-25 ENCOUNTER — Encounter: Payer: Medicare HMO | Admitting: Physical Therapy

## 2019-05-26 ENCOUNTER — Encounter: Payer: Medicare HMO | Admitting: Physical Therapy

## 2019-06-06 ENCOUNTER — Encounter: Payer: Medicare HMO | Admitting: Physical Therapy

## 2019-06-06 DIAGNOSIS — R2681 Unsteadiness on feet: Secondary | ICD-10-CM | POA: Diagnosis not present

## 2019-06-06 DIAGNOSIS — M6281 Muscle weakness (generalized): Secondary | ICD-10-CM | POA: Diagnosis not present

## 2019-06-08 ENCOUNTER — Encounter: Payer: Medicare HMO | Admitting: Physical Therapy

## 2019-06-09 DIAGNOSIS — R2681 Unsteadiness on feet: Secondary | ICD-10-CM | POA: Diagnosis not present

## 2019-06-09 DIAGNOSIS — M6281 Muscle weakness (generalized): Secondary | ICD-10-CM | POA: Diagnosis not present

## 2019-06-13 DIAGNOSIS — R2681 Unsteadiness on feet: Secondary | ICD-10-CM | POA: Diagnosis not present

## 2019-06-13 DIAGNOSIS — M6281 Muscle weakness (generalized): Secondary | ICD-10-CM | POA: Diagnosis not present

## 2019-06-14 ENCOUNTER — Encounter: Payer: Medicare HMO | Admitting: Physical Therapy

## 2019-06-15 ENCOUNTER — Encounter: Payer: Medicare HMO | Admitting: Physical Therapy

## 2019-06-16 DIAGNOSIS — M6281 Muscle weakness (generalized): Secondary | ICD-10-CM | POA: Diagnosis not present

## 2019-06-16 DIAGNOSIS — R2681 Unsteadiness on feet: Secondary | ICD-10-CM | POA: Diagnosis not present

## 2019-06-20 ENCOUNTER — Encounter: Payer: Medicare HMO | Admitting: Physical Therapy

## 2019-06-22 ENCOUNTER — Encounter: Payer: Medicare HMO | Admitting: Physical Therapy

## 2019-06-22 DIAGNOSIS — R2681 Unsteadiness on feet: Secondary | ICD-10-CM | POA: Diagnosis not present

## 2019-06-22 DIAGNOSIS — M6281 Muscle weakness (generalized): Secondary | ICD-10-CM | POA: Diagnosis not present

## 2019-06-23 DIAGNOSIS — K5010 Crohn's disease of large intestine without complications: Secondary | ICD-10-CM | POA: Diagnosis not present

## 2019-06-27 DIAGNOSIS — R2681 Unsteadiness on feet: Secondary | ICD-10-CM | POA: Diagnosis not present

## 2019-06-27 DIAGNOSIS — M6281 Muscle weakness (generalized): Secondary | ICD-10-CM | POA: Diagnosis not present

## 2019-06-29 DIAGNOSIS — M6281 Muscle weakness (generalized): Secondary | ICD-10-CM | POA: Diagnosis not present

## 2019-06-29 DIAGNOSIS — R2681 Unsteadiness on feet: Secondary | ICD-10-CM | POA: Diagnosis not present

## 2019-07-04 DIAGNOSIS — R2681 Unsteadiness on feet: Secondary | ICD-10-CM | POA: Diagnosis not present

## 2019-07-04 DIAGNOSIS — M6281 Muscle weakness (generalized): Secondary | ICD-10-CM | POA: Diagnosis not present

## 2019-07-07 DIAGNOSIS — M6281 Muscle weakness (generalized): Secondary | ICD-10-CM | POA: Diagnosis not present

## 2019-07-07 DIAGNOSIS — R2681 Unsteadiness on feet: Secondary | ICD-10-CM | POA: Diagnosis not present

## 2019-07-11 DIAGNOSIS — R2681 Unsteadiness on feet: Secondary | ICD-10-CM | POA: Diagnosis not present

## 2019-07-11 DIAGNOSIS — M6281 Muscle weakness (generalized): Secondary | ICD-10-CM | POA: Diagnosis not present

## 2019-07-14 DIAGNOSIS — R2681 Unsteadiness on feet: Secondary | ICD-10-CM | POA: Diagnosis not present

## 2019-07-14 DIAGNOSIS — M6281 Muscle weakness (generalized): Secondary | ICD-10-CM | POA: Diagnosis not present

## 2019-07-18 DIAGNOSIS — E119 Type 2 diabetes mellitus without complications: Secondary | ICD-10-CM | POA: Diagnosis not present

## 2019-07-18 DIAGNOSIS — B351 Tinea unguium: Secondary | ICD-10-CM | POA: Diagnosis not present

## 2019-08-01 DIAGNOSIS — K9089 Other intestinal malabsorption: Secondary | ICD-10-CM | POA: Diagnosis not present

## 2019-08-01 DIAGNOSIS — E1169 Type 2 diabetes mellitus with other specified complication: Secondary | ICD-10-CM | POA: Diagnosis not present

## 2019-08-01 DIAGNOSIS — Z1389 Encounter for screening for other disorder: Secondary | ICD-10-CM | POA: Diagnosis not present

## 2019-08-01 DIAGNOSIS — I10 Essential (primary) hypertension: Secondary | ICD-10-CM | POA: Diagnosis not present

## 2019-08-01 DIAGNOSIS — K219 Gastro-esophageal reflux disease without esophagitis: Secondary | ICD-10-CM | POA: Diagnosis not present

## 2019-08-01 DIAGNOSIS — I700 Atherosclerosis of aorta: Secondary | ICD-10-CM | POA: Diagnosis not present

## 2019-08-01 DIAGNOSIS — Z862 Personal history of diseases of the blood and blood-forming organs and certain disorders involving the immune mechanism: Secondary | ICD-10-CM | POA: Diagnosis not present

## 2019-08-01 DIAGNOSIS — Z0000 Encounter for general adult medical examination without abnormal findings: Secondary | ICD-10-CM | POA: Diagnosis not present

## 2019-08-01 DIAGNOSIS — E039 Hypothyroidism, unspecified: Secondary | ICD-10-CM | POA: Diagnosis not present

## 2019-08-01 DIAGNOSIS — J439 Emphysema, unspecified: Secondary | ICD-10-CM | POA: Diagnosis not present

## 2019-08-01 DIAGNOSIS — E559 Vitamin D deficiency, unspecified: Secondary | ICD-10-CM | POA: Diagnosis not present

## 2019-08-02 DIAGNOSIS — R2681 Unsteadiness on feet: Secondary | ICD-10-CM | POA: Diagnosis not present

## 2019-08-02 DIAGNOSIS — M6281 Muscle weakness (generalized): Secondary | ICD-10-CM | POA: Diagnosis not present

## 2019-08-04 DIAGNOSIS — R2681 Unsteadiness on feet: Secondary | ICD-10-CM | POA: Diagnosis not present

## 2019-08-04 DIAGNOSIS — M6281 Muscle weakness (generalized): Secondary | ICD-10-CM | POA: Diagnosis not present

## 2019-08-08 DIAGNOSIS — E119 Type 2 diabetes mellitus without complications: Secondary | ICD-10-CM | POA: Diagnosis not present

## 2019-08-08 DIAGNOSIS — E039 Hypothyroidism, unspecified: Secondary | ICD-10-CM | POA: Diagnosis not present

## 2019-08-09 DIAGNOSIS — M6281 Muscle weakness (generalized): Secondary | ICD-10-CM | POA: Diagnosis not present

## 2019-08-09 DIAGNOSIS — R2681 Unsteadiness on feet: Secondary | ICD-10-CM | POA: Diagnosis not present

## 2019-08-11 DIAGNOSIS — R2681 Unsteadiness on feet: Secondary | ICD-10-CM | POA: Diagnosis not present

## 2019-08-11 DIAGNOSIS — M6281 Muscle weakness (generalized): Secondary | ICD-10-CM | POA: Diagnosis not present

## 2019-08-18 DIAGNOSIS — R2681 Unsteadiness on feet: Secondary | ICD-10-CM | POA: Diagnosis not present

## 2019-08-18 DIAGNOSIS — M6281 Muscle weakness (generalized): Secondary | ICD-10-CM | POA: Diagnosis not present

## 2019-09-01 DIAGNOSIS — R2681 Unsteadiness on feet: Secondary | ICD-10-CM | POA: Diagnosis not present

## 2019-09-01 DIAGNOSIS — M6281 Muscle weakness (generalized): Secondary | ICD-10-CM | POA: Diagnosis not present

## 2019-09-08 DIAGNOSIS — R2681 Unsteadiness on feet: Secondary | ICD-10-CM | POA: Diagnosis not present

## 2019-09-08 DIAGNOSIS — M6281 Muscle weakness (generalized): Secondary | ICD-10-CM | POA: Diagnosis not present

## 2019-09-12 DIAGNOSIS — M6281 Muscle weakness (generalized): Secondary | ICD-10-CM | POA: Diagnosis not present

## 2019-09-12 DIAGNOSIS — R2681 Unsteadiness on feet: Secondary | ICD-10-CM | POA: Diagnosis not present

## 2019-09-15 DIAGNOSIS — M6281 Muscle weakness (generalized): Secondary | ICD-10-CM | POA: Diagnosis not present

## 2019-09-15 DIAGNOSIS — R2681 Unsteadiness on feet: Secondary | ICD-10-CM | POA: Diagnosis not present

## 2019-10-04 DIAGNOSIS — M6281 Muscle weakness (generalized): Secondary | ICD-10-CM | POA: Diagnosis not present

## 2019-10-04 DIAGNOSIS — R2681 Unsteadiness on feet: Secondary | ICD-10-CM | POA: Diagnosis not present

## 2019-10-06 DIAGNOSIS — R2681 Unsteadiness on feet: Secondary | ICD-10-CM | POA: Diagnosis not present

## 2019-10-06 DIAGNOSIS — M6281 Muscle weakness (generalized): Secondary | ICD-10-CM | POA: Diagnosis not present

## 2019-10-11 DIAGNOSIS — M6281 Muscle weakness (generalized): Secondary | ICD-10-CM | POA: Diagnosis not present

## 2019-10-11 DIAGNOSIS — R2681 Unsteadiness on feet: Secondary | ICD-10-CM | POA: Diagnosis not present

## 2019-10-19 DIAGNOSIS — B351 Tinea unguium: Secondary | ICD-10-CM | POA: Diagnosis not present

## 2019-10-19 DIAGNOSIS — E119 Type 2 diabetes mellitus without complications: Secondary | ICD-10-CM | POA: Diagnosis not present

## 2019-10-20 DIAGNOSIS — R2681 Unsteadiness on feet: Secondary | ICD-10-CM | POA: Diagnosis not present

## 2019-10-20 DIAGNOSIS — M6281 Muscle weakness (generalized): Secondary | ICD-10-CM | POA: Diagnosis not present

## 2019-10-25 DIAGNOSIS — M6281 Muscle weakness (generalized): Secondary | ICD-10-CM | POA: Diagnosis not present

## 2019-10-25 DIAGNOSIS — R2681 Unsteadiness on feet: Secondary | ICD-10-CM | POA: Diagnosis not present

## 2019-11-03 DIAGNOSIS — R2681 Unsteadiness on feet: Secondary | ICD-10-CM | POA: Diagnosis not present

## 2019-11-03 DIAGNOSIS — M6281 Muscle weakness (generalized): Secondary | ICD-10-CM | POA: Diagnosis not present

## 2019-11-16 DIAGNOSIS — M6281 Muscle weakness (generalized): Secondary | ICD-10-CM | POA: Diagnosis not present

## 2019-11-16 DIAGNOSIS — R2681 Unsteadiness on feet: Secondary | ICD-10-CM | POA: Diagnosis not present

## 2019-11-21 DIAGNOSIS — M6281 Muscle weakness (generalized): Secondary | ICD-10-CM | POA: Diagnosis not present

## 2019-11-21 DIAGNOSIS — R2681 Unsteadiness on feet: Secondary | ICD-10-CM | POA: Diagnosis not present

## 2019-11-24 DIAGNOSIS — M6281 Muscle weakness (generalized): Secondary | ICD-10-CM | POA: Diagnosis not present

## 2019-11-24 DIAGNOSIS — R2681 Unsteadiness on feet: Secondary | ICD-10-CM | POA: Diagnosis not present

## 2019-12-01 DIAGNOSIS — R2681 Unsteadiness on feet: Secondary | ICD-10-CM | POA: Diagnosis not present

## 2019-12-01 DIAGNOSIS — M6281 Muscle weakness (generalized): Secondary | ICD-10-CM | POA: Diagnosis not present

## 2019-12-05 DIAGNOSIS — R059 Cough, unspecified: Secondary | ICD-10-CM | POA: Diagnosis not present

## 2019-12-06 DIAGNOSIS — R059 Cough, unspecified: Secondary | ICD-10-CM | POA: Diagnosis not present

## 2019-12-06 DIAGNOSIS — Z03818 Encounter for observation for suspected exposure to other biological agents ruled out: Secondary | ICD-10-CM | POA: Diagnosis not present

## 2019-12-21 DIAGNOSIS — B351 Tinea unguium: Secondary | ICD-10-CM | POA: Diagnosis not present

## 2019-12-21 DIAGNOSIS — E119 Type 2 diabetes mellitus without complications: Secondary | ICD-10-CM | POA: Diagnosis not present

## 2019-12-26 HISTORY — PX: COLONOSCOPY: SHX174

## 2020-01-30 DIAGNOSIS — K219 Gastro-esophageal reflux disease without esophagitis: Secondary | ICD-10-CM | POA: Diagnosis not present

## 2020-01-30 DIAGNOSIS — K5909 Other constipation: Secondary | ICD-10-CM | POA: Diagnosis not present

## 2020-01-30 DIAGNOSIS — G4485 Primary stabbing headache: Secondary | ICD-10-CM | POA: Diagnosis not present

## 2020-01-30 DIAGNOSIS — E039 Hypothyroidism, unspecified: Secondary | ICD-10-CM | POA: Diagnosis not present

## 2020-01-30 DIAGNOSIS — E538 Deficiency of other specified B group vitamins: Secondary | ICD-10-CM | POA: Diagnosis not present

## 2020-01-30 DIAGNOSIS — K9089 Other intestinal malabsorption: Secondary | ICD-10-CM | POA: Diagnosis not present

## 2020-01-30 DIAGNOSIS — I10 Essential (primary) hypertension: Secondary | ICD-10-CM | POA: Diagnosis not present

## 2020-01-30 DIAGNOSIS — E1169 Type 2 diabetes mellitus with other specified complication: Secondary | ICD-10-CM | POA: Diagnosis not present

## 2020-01-30 DIAGNOSIS — R251 Tremor, unspecified: Secondary | ICD-10-CM | POA: Diagnosis not present

## 2020-02-10 DIAGNOSIS — E119 Type 2 diabetes mellitus without complications: Secondary | ICD-10-CM | POA: Diagnosis not present

## 2020-02-10 DIAGNOSIS — E039 Hypothyroidism, unspecified: Secondary | ICD-10-CM | POA: Diagnosis not present

## 2020-03-07 ENCOUNTER — Encounter: Payer: Self-pay | Admitting: Podiatry

## 2020-03-07 ENCOUNTER — Ambulatory Visit: Payer: HMO | Admitting: Podiatry

## 2020-03-07 ENCOUNTER — Other Ambulatory Visit: Payer: Self-pay

## 2020-03-07 DIAGNOSIS — B353 Tinea pedis: Secondary | ICD-10-CM

## 2020-03-07 DIAGNOSIS — B351 Tinea unguium: Secondary | ICD-10-CM

## 2020-03-07 DIAGNOSIS — Z794 Long term (current) use of insulin: Secondary | ICD-10-CM

## 2020-03-07 DIAGNOSIS — M79674 Pain in right toe(s): Secondary | ICD-10-CM | POA: Diagnosis not present

## 2020-03-07 DIAGNOSIS — I69359 Hemiplegia and hemiparesis following cerebral infarction affecting unspecified side: Secondary | ICD-10-CM | POA: Diagnosis not present

## 2020-03-07 DIAGNOSIS — M79675 Pain in left toe(s): Secondary | ICD-10-CM | POA: Diagnosis not present

## 2020-03-07 DIAGNOSIS — I69398 Other sequelae of cerebral infarction: Secondary | ICD-10-CM

## 2020-03-07 DIAGNOSIS — E1142 Type 2 diabetes mellitus with diabetic polyneuropathy: Secondary | ICD-10-CM | POA: Diagnosis not present

## 2020-03-07 DIAGNOSIS — K9089 Other intestinal malabsorption: Secondary | ICD-10-CM | POA: Insufficient documentation

## 2020-03-07 DIAGNOSIS — M216X2 Other acquired deformities of left foot: Secondary | ICD-10-CM | POA: Diagnosis not present

## 2020-03-07 MED ORDER — KETOCONAZOLE 2 % EX CREA: 1.0000 "application " | TOPICAL_CREAM | Freq: Every day | CUTANEOUS | 2 refills | Status: DC

## 2020-03-07 NOTE — Progress Notes (Signed)
  Subjective:  Patient ID: [name], female    DOB: 1950-07-06,  MRN: [id number]  Chief Complaint  Patient presents with  . Nail Problem  . Diabetes    Nail trim and DFC    70 y.o. female presents with the above complaint. History confirmed with patient.  Her diabetes is well controlled at 7.3%.  She uses a wheelchair due to hemiplegia left side from a stroke.  She previously has seen Dr. Caryl Comes for diabetic foot care and is now transitioning care as he is retiring.  She wears a double upright AFO on the left.  She also complains of spasm and cramping with shooting dorsiflexion pain to the hallux on the right at night Objective:  Physical Exam: warm, good capillary refill, no trophic changes or ulcerative lesions and normal DP and PT pulses.  Some protective sensation loss distally in the tips of the toes.  She has onychomycosis of all digits with subungual debris, thickening and discoloration.  She has a fully reducible adductovarus contracture of the left lower extremity.  She has 4 out of 5 muscle strength in dorsiflexion, plantarflexion, inversion on the left side 0/5 strength in eversion.  She has scaling rash on the plantar feet.  Assessment:   1. Tinea pedis of both feet      Plan:  Patient was evaluated and treated and all questions answered.  Unclear to me with the spasmic contracture is that she is having on the right side.  Its possible this is due to her neurologic injuries.  Advised to use quinine and warm water to see if this alleviates any spasms or cramping at night.  The left side she has an acquired varus foot type.  This is fully reducible.  She currently has a double upright brace.  She was evaluated by our pedorthist EJ today.  He thinks this is not the best thing for her as its very difficult for her put on to with her hemiparesis of the left arm.  I will have her back to see our pedorthist Velora Heckler to see if the prefabricated dropfoot AFO will be the best thing  for her or if she should have something more customized and hinged on the plastic from [facility].  We also discussed if this fails that surgical tendon transfer to reduce the deformity could be an option.  Patient educated on diabetes. Discussed proper diabetic foot care and discussed risks and complications of disease. Educated patient in depth on reasons to return to the office immediately should he/she discover anything concerning or new on the feet. All questions answered. Discussed proper shoes as well.   Discussed the etiology and treatment options for the condition in detail with the patient. Educated patient on the topical and oral treatment options for mycotic nails. Recommended debridement of the nails today. Sharp and mechanical debridement performed of all painful and mycotic nails today. Nails debrided in length and thickness using a nail nipper to level of comfort. Discussed treatment options including appropriate shoe gear. Follow up as needed for painful nails.  She would like to follow-up every 9 weeks that she saw Dr. Caryl Comes.  We will have her follow-up with Dr. Prudence Davidson for routine foot care, she can return to see me if tendon transfer is the best option for her.   Return in about 9 weeks (around 05/09/2020) for at risk diabetic foot care.

## 2020-03-09 ENCOUNTER — Encounter: Payer: Self-pay | Admitting: Podiatry

## 2020-03-28 ENCOUNTER — Ambulatory Visit: Payer: HMO | Admitting: Orthotics

## 2020-03-28 ENCOUNTER — Other Ambulatory Visit: Payer: Self-pay

## 2020-03-28 DIAGNOSIS — E1142 Type 2 diabetes mellitus with diabetic polyneuropathy: Secondary | ICD-10-CM

## 2020-03-28 DIAGNOSIS — M216X2 Other acquired deformities of left foot: Secondary | ICD-10-CM

## 2020-03-28 DIAGNOSIS — Z794 Long term (current) use of insulin: Secondary | ICD-10-CM

## 2020-03-28 DIAGNOSIS — I69359 Hemiplegia and hemiparesis following cerebral infarction affecting unspecified side: Secondary | ICD-10-CM

## 2020-03-28 NOTE — Progress Notes (Signed)
Talked with patient about her foot drop LEFT; she reports that she doesn't wear her current double upright MAFO much as it is difficult to don/doff.   She is interested in alternatives.   I feel because of her limited mobility on the left side (upper); she would also be challenged to put on/remove an [place] type brace w/ hinges as well.     I am going to bring her an OTS foot drop brace from [place] to try;  IF we don't have to be too concerned about frontal plane instability, this might work well.    If she becomes more unstable, then we need to send her to [facility] for a solid thermoplastic AFO with tamarrack dorsi-assist hinges and possible pos stop.

## 2020-04-11 ENCOUNTER — Ambulatory Visit: Payer: HMO | Admitting: Orthotics

## 2020-04-11 ENCOUNTER — Other Ambulatory Visit: Payer: Self-pay

## 2020-04-11 DIAGNOSIS — E1142 Type 2 diabetes mellitus with diabetic polyneuropathy: Secondary | ICD-10-CM

## 2020-04-11 DIAGNOSIS — M216X2 Other acquired deformities of left foot: Secondary | ICD-10-CM

## 2020-04-11 DIAGNOSIS — Z794 Long term (current) use of insulin: Secondary | ICD-10-CM

## 2020-04-11 NOTE — Progress Notes (Signed)
Patient will get OTS brace ([place]) LEFT; she tried a floor reaction brace and loved it.

## 2020-04-12 ENCOUNTER — Ambulatory Visit: Payer: Medicare HMO | Admitting: Neurology

## 2020-04-13 ENCOUNTER — Telehealth (INDEPENDENT_AMBULATORY_CARE_PROVIDER_SITE_OTHER): Payer: HMO | Admitting: Podiatry

## 2020-04-13 DIAGNOSIS — I69359 Hemiplegia and hemiparesis following cerebral infarction affecting unspecified side: Secondary | ICD-10-CM | POA: Diagnosis not present

## 2020-04-13 DIAGNOSIS — M21372 Foot drop, left foot: Secondary | ICD-10-CM | POA: Diagnosis not present

## 2020-04-13 DIAGNOSIS — I69398 Other sequelae of cerebral infarction: Secondary | ICD-10-CM | POA: Diagnosis not present

## 2020-04-13 NOTE — Telephone Encounter (Signed)
Received voicemail from Ed[employer] @ [email or web address] stating they received my prior auth and is more information.  I returned call and they are needing a note/rx stating that you are prescribing this particular [phone])  for this pt. Please fax to [phone].Marland Kitchen once she gets this she can get it approved.

## 2020-04-16 DIAGNOSIS — Z7984 Long term (current) use of oral hypoglycemic drugs: Secondary | ICD-10-CM | POA: Diagnosis not present

## 2020-04-16 DIAGNOSIS — H53462 Homonymous bilateral field defects, left side: Secondary | ICD-10-CM | POA: Diagnosis not present

## 2020-04-16 DIAGNOSIS — H04123 Dry eye syndrome of bilateral lacrimal glands: Secondary | ICD-10-CM | POA: Diagnosis not present

## 2020-04-16 DIAGNOSIS — H5213 Myopia, bilateral: Secondary | ICD-10-CM | POA: Diagnosis not present

## 2020-04-16 DIAGNOSIS — H524 Presbyopia: Secondary | ICD-10-CM | POA: Diagnosis not present

## 2020-04-16 DIAGNOSIS — H52223 Regular astigmatism, bilateral: Secondary | ICD-10-CM | POA: Diagnosis not present

## 2020-04-16 DIAGNOSIS — E119 Type 2 diabetes mellitus without complications: Secondary | ICD-10-CM | POA: Diagnosis not present

## 2020-04-20 DIAGNOSIS — M7062 Trochanteric bursitis, left hip: Secondary | ICD-10-CM | POA: Diagnosis not present

## 2020-04-20 DIAGNOSIS — M189 Osteoarthritis of first carpometacarpal joint, unspecified: Secondary | ICD-10-CM | POA: Diagnosis not present

## 2020-04-24 ENCOUNTER — Other Ambulatory Visit: Payer: Self-pay

## 2020-04-24 ENCOUNTER — Ambulatory Visit: Payer: HMO | Admitting: Neurology

## 2020-04-24 ENCOUNTER — Encounter: Payer: Self-pay | Admitting: Neurology

## 2020-04-24 VITALS — BP 168/88 | HR 91 | Ht 64.0 in | Wt 162.0 lb

## 2020-04-24 DIAGNOSIS — R269 Unspecified abnormalities of gait and mobility: Secondary | ICD-10-CM

## 2020-04-24 DIAGNOSIS — I69398 Other sequelae of cerebral infarction: Secondary | ICD-10-CM

## 2020-04-24 DIAGNOSIS — G250 Essential tremor: Secondary | ICD-10-CM

## 2020-04-24 DIAGNOSIS — I69359 Hemiplegia and hemiparesis following cerebral infarction affecting unspecified side: Secondary | ICD-10-CM

## 2020-04-24 HISTORY — DX: Essential tremor: G25.0

## 2020-04-24 MED ORDER — DIAZEPAM 2 MG PO TABS: 2.0000 mg | ORAL_TABLET | Freq: Every day | ORAL | 2 refills | Status: DC

## 2020-04-24 NOTE — Progress Notes (Signed)
Reason for visit: Tremor, ice pick pains  Referring physician: Dr. Michae Kava is a 70 y.o. female  History of present illness:  Ms. Unterreiner is a 70 year old right-handed white female with a history of cerebrovascular disease, the patient sustained a right posterior cerebral artery distribution stroke in 2009 that also included the right thalamic area that left her with a left hemiparesis and sensory changes and a complete left homonymous hemianopsia. The patient has had significant spasticity on the left side of the body including the torso area since that time. Within the last year, she has developed a tremor that has affected the right hand that is most noticeable when she is trying to feed herself or perform handwriting. The patient goes on to say that her mother and her brother also have a similar tremor. The patient has not noted any tremor affecting the jaw or the head and neck or the voice. She has also noted a lifelong history of ice pick pains. She began having these around age 2, but she never actually had migraine. The ice pick pains tend to be in the parietal and occipital areas of the head, but occasionally may be in the frontal regions on either side. The headache events are brief and sharp, unassociated with any vision changes or nausea or vomiting or dizziness. The patient reports that she may get nocturnal leg cramps affecting the right foot and lower leg. She has a lot of discomfort in the left side and flank associated with spasticity, she has to stretch frequently. She was on tizanidine but had a lot of drowsiness and diarrhea on the medication. She has a history of Crohn's disease. The patient also has diabetes, her last hemoglobin A1c was 7.3. The patient sent to this office for further evaluation.   Past Medical History:  Diagnosis Date  . Abnormality of gait 01/26/2013  . Bruises easily   . Constipation   . Crohn's disease ([place])   . Depression   . Dizziness and  giddiness 01/26/2013  . GERD (gastroesophageal reflux disease)   . Hemianopia    left  . Hemiparesis and alteration of sensations as late effects of stroke ([place]) 01/26/2013  . Hyperlipidemia   . Hypertension   . Hypothyroidism   . Lump in female breast   . Panic disorder   . PONV (postoperative nausea and vomiting)   . Stroke [facility]) 2009  . Type II diabetes mellitus ([place])     Past Surgical History:  Procedure Laterality Date  . ANAL FISSURE REPAIR  2008  . APPENDECTOMY  1986  . BREAST BIOPSY Left 2006   neg  . BREAST LUMPECTOMY Right 1990's  . [place]; 1983  . CHOLECYSTECTOMY  1986  . SMALL INTESTINE SURGERY  05/1991  . TUBAL LIGATION  1983    Family History  Problem Relation Age of Onset  . Heart disease Mother   . Heart disease Father   . Diabetes Brother   . Diabetes Brother   . Migraines Son     Social history:  reports that she quit smoking about 27 years ago. Her smoking use included cigarettes. She has a 20.00 pack-year smoking history. She has never used smokeless tobacco. She reports current alcohol use. She reports that she does not use drugs.  Medications:  Prior to Admission medications   Medication Sig Start Date End Date Taking? Authorizing Provider  aspirin 81 MG tablet Take 81 mg by mouth daily.  [provider]  colestipol (COLESTID) 1 g tablet TAKE 2 TABLETS(2 GRAMS) BY MOUTH TWICE DAILY BEFORE MEALS 10/28/19   [provider]  insulin degludec (TRESIBA FLEXTOUCH) 100 UNIT/ML FlexTouch Pen Inject into the skin. 07/17/17   [provider]  ketoconazole (NIZORAL) 2 % cream Apply 1 application topically daily. 03/07/20   Criselda Peaches, DPM  levothyroxine (SYNTHROID) 137 MCG tablet Take 137 mcg by mouth every morning. 01/31/20   [provider]  pantoprazole (PROTONIX) 40 MG tablet Take 40 mg by mouth daily.  04/01/11   [provider]  ramipril (ALTACE) 2.5 MG capsule Take 2.5 mg by mouth daily.   04/01/11   [provider]  simvastatin (ZOCOR) 20 MG tablet SMARTSIG:1 Tablet(s) By Mouth Every Evening 02/04/20   [provider]      Allergies  Allergen Reactions  . Codeine Other (See Comments)    Syncope.  . Morphine And Related Nausea And Vomiting and Other (See Comments)    Hallucinations.  . Baclofen Nausea Only and Other (See Comments)    dizziness  . Metformin Hcl Other (See Comments)  . Penicillins Other (See Comments)    Family history  . Sulfamethoxazole Itching    Hands itching   . Elemental Sulfur Itching    Per patient happened years ago.    ROS:  Out of a complete 14 system review of symptoms, the patient complains only of the following symptoms, and all other reviewed systems are negative.  Tremor Muscle spasms Walking difficulty  Blood pressure (!) 168/88, pulse 91, height 5\' 4"  (1.626 m), weight 162 lb (73.5 kg).  Physical Exam  General: The patient is alert and cooperative at the time of the examination.  Eyes: Pupils are equal, round, and reactive to light. Discs are flat bilaterally.  Neck: The neck is supple, no carotid bruits are noted.  Respiratory: The respiratory examination is clear.  Cardiovascular: The cardiovascular examination reveals a regular rate and rhythm, no obvious murmurs or rubs are noted.  Skin: Extremities are without significant edema.  Neurologic Exam  Mental status: The patient is alert and oriented x 3 at the time of the examination. The patient has apparent normal recent and remote memory, with an apparently normal attention span and concentration ability.  Cranial nerves: Facial symmetry is present. There is good sensation of the face to pinprick and soft touch bilaterally. The strength of the facial muscles and the muscles to head turning and shoulder shrug are normal bilaterally. Speech is well enunciated, no aphasia or dysarthria is noted. Extraocular movements are full. Visual fields are notable  for dense left homonymous visual field deficit. The tongue is midline, and the patient has symmetric elevation of the soft palate. No obvious hearing deficits are noted.  Motor: The motor testing reveals 5 over 5 strength of the right extremities. On the left side, the left arm is in flexion, the patient has a significant increase in motor tone involving the left arm greater than left leg. The patient is unable to elevate the left arm, she does have some grip with the left hand. The patient has 4 -/5 strength proximally in the left leg, 4/5 strength distally.  Sensory: Sensory testing is intact to pinprick, soft touch, vibration sensation, and position sense on the right extremities. Pinprick sensation and vibration sensation are decreased on the left arm and left leg, position sensation is normal in the left hand, decreased in the left foot. No evidence of extinction is noted.  Coordination: Cerebellar testing reveals good finger-nose-finger and heel-to-shin on the right, the patient is not able to perform finger-nose-finger with the left hand, has difficulty with heel-to-shin with the left foot.  Gait and station: The patient walks with a walker, she has a circumduction gait with the left leg. She holds the left arm in flexion.  Reflexes: Deep tendon reflexes are notable for some increased reflexes on the left biceps, triceps, knee jerk reflexes. Toes are downgoing on the right, upgoing on the left.   Assessment/Plan:  1. Essential tremor  2. Ice pick pains  3. History of stroke, left hemiparesis, left-sided spasticity  The patient is having a lot of issues with nocturnal leg cramps and spasticity. The patient does have a strong family history for essential tremor. Given the tremor issues and the spasticity, I will start low-dose diazepam taking 2 mg at night, if she tolerates this well, we may increase the dosing during the day as well. She will follow up in 3 to 4 months. The patient will  begin 5 mg of melatonin daily when the icepick pains occur, she claims that these tend to occur during the spring and fall months, not during the winter or summer. Gabapentin can also be used for ice pick pains and could also help spasticity and tremor.  Jill Alexanders MD 04/24/2020 1:59 PM  [facility] [address] [place], [place] [address]  Phone [phone] Fax [phone]

## 2020-04-25 ENCOUNTER — Ambulatory Visit: Payer: HMO | Admitting: Gastroenterology

## 2020-04-25 ENCOUNTER — Encounter: Payer: Self-pay | Admitting: Gastroenterology

## 2020-04-25 ENCOUNTER — Other Ambulatory Visit: Payer: Self-pay

## 2020-04-25 ENCOUNTER — Other Ambulatory Visit: Payer: HMO

## 2020-04-25 VITALS — BP 153/90 | HR 102 | Temp 98.3°F | Wt 159.5 lb

## 2020-04-25 DIAGNOSIS — K5909 Other constipation: Secondary | ICD-10-CM | POA: Diagnosis not present

## 2020-04-25 DIAGNOSIS — G8929 Other chronic pain: Secondary | ICD-10-CM | POA: Insufficient documentation

## 2020-04-25 DIAGNOSIS — E7800 Pure hypercholesterolemia, unspecified: Secondary | ICD-10-CM | POA: Insufficient documentation

## 2020-04-25 DIAGNOSIS — Z8673 Personal history of transient ischemic attack (TIA), and cerebral infarction without residual deficits: Secondary | ICD-10-CM | POA: Insufficient documentation

## 2020-04-25 DIAGNOSIS — K219 Gastro-esophageal reflux disease without esophagitis: Secondary | ICD-10-CM | POA: Insufficient documentation

## 2020-04-25 DIAGNOSIS — Z862 Personal history of diseases of the blood and blood-forming organs and certain disorders involving the immune mechanism: Secondary | ICD-10-CM | POA: Insufficient documentation

## 2020-04-25 DIAGNOSIS — I10 Essential (primary) hypertension: Secondary | ICD-10-CM | POA: Insufficient documentation

## 2020-04-25 DIAGNOSIS — I700 Atherosclerosis of aorta: Secondary | ICD-10-CM | POA: Insufficient documentation

## 2020-04-25 DIAGNOSIS — J439 Emphysema, unspecified: Secondary | ICD-10-CM | POA: Insufficient documentation

## 2020-04-25 DIAGNOSIS — E119 Type 2 diabetes mellitus without complications: Secondary | ICD-10-CM

## 2020-04-25 DIAGNOSIS — E559 Vitamin D deficiency, unspecified: Secondary | ICD-10-CM | POA: Insufficient documentation

## 2020-04-25 NOTE — Progress Notes (Signed)
Cephas Darby, MD [address]  [place]  [place], [place] [address]  Main: [phone]  Fax: [phone]    Gastroenterology Consultation  Referring Provider:     Lavone Orn, MD Primary Care Physician:  Lavone Orn, MD Primary Gastroenterologist: [facility], Dr. Teresa Pelton Reason for Consultation:     Constipation        HPI:   [name] is a 70 y.o. female referred by Dr. Lavone Orn, MD  for consultation & management of chronic constipation.  Patient had history of possible Crohn's s/p ileal resection in 1990s.  She also has history of pelvic floor dyssynergia, underwent biofeedback in the past.  She has history of chronic constipation, reports having hard bowel movements with incomplete emptying.  When patient tried MiraLAX or Metamucil Linzess, that has resulted in diarrheal episodes.  He was previously seen by Dr. Gustavo Lah, was referred to [facility] for second opinion.  She was told to stop the above medications, and she was told she has bile salt associated diarrhea, therefore she was started on Colestid which has resulted in constipation.  She decreased the dose to 1 pill daily.  For the last 1 month, she stopped Colestid because of severe constipation.  Usually, her initial BM is hard and lumpy, on Bristol stool scale 1, subsequent bowel movements become softer.  She has history of stroke resulting in left-sided weakness as well as partial vision loss which has limited her ability to be physically active.  Also, it is challenging for her to reach bathroom before she has a BM because of her left-sided weakness.  She manages to drink adequate amount of water.  She does have some fiber in her diet.  She had past history of iron deficiency and B12 deficiency anemia.  NSAIDs: None  Antiplts/Anticoagulants/Anti thrombotics: None  GI Procedures:  Colonoscopy 03/11/2019 DIAGNOSIS    A. Ileum, terminal, endoscopic biopsy: Small intestinal mucosa with no  significant pathologic finding.  B. Ileum, anastomosis, endoscopic biopsy: Intestinal mucosa with lamina propria fibrosis and ulceration, consistent with anastomotic site.  C. Colon, ascending, endoscopic biopsy: Colonic mucosa with no significant pathologic finding.  D. Colon, transverse, endoscopic biopsy: Colonic mucosa with no significant pathologic finding.  E. Colon, descending, endoscopic biopsy: Colonic mucosa with no significant pathologic finding.  F. Colon, rectosigmoid, endoscopic biopsy: Colonic mucosa with no significant pathologic finding.   Comment: The patient's history of Crohn's disease is noted. All parts are negative for granulomas or dysplasia  Past Medical History:  Diagnosis Date  . Abnormality of gait 01/26/2013  . Bruises easily   . Constipation   . Crohn's disease ([place])   . Depression   . Dizziness and giddiness 01/26/2013  . GERD (gastroesophageal reflux disease)   . Hemianopia    left  . Hemiparesis and alteration of sensations as late effects of stroke ([place]) 01/26/2013  . Hyperlipidemia   . Hypertension   . Hypothyroidism   . Lump in female breast   . Panic disorder   . PONV (postoperative nausea and vomiting)   . Stroke [facility]) 2009  . Tremor, essential 04/24/2020  . Type II diabetes mellitus ([place])     Past Surgical History:  Procedure Laterality Date  . ANAL FISSURE REPAIR  2008  . APPENDECTOMY  1986  . BREAST BIOPSY Left 2006   neg  . BREAST LUMPECTOMY Right 1990's  . [place]; 1983  . CHOLECYSTECTOMY  1986  . SMALL INTESTINE SURGERY  05/1991  .  TUBAL LIGATION  1983    Current Outpatient Medications:  .  diazepam (VALIUM) 2 MG tablet, Take 1 tablet (2 mg total) by mouth at bedtime., Disp: 30 tablet, Rfl: 2 .  ketoconazole (NIZORAL) 2 % cream, Apply 1 application topically daily., Disp: 60 g, Rfl: 2 .  levothyroxine (SYNTHROID) 125 MCG tablet, Take 125 mcg by mouth daily., Disp: , Rfl:  .  levothyroxine (SYNTHROID)  137 MCG tablet, Take 137 mcg by mouth every morning., Disp: , Rfl:  .  pantoprazole (PROTONIX) 40 MG tablet, Take 40 mg by mouth daily. , Disp: , Rfl:  .  ramipril (ALTACE) 2.5 MG capsule, Take 2.5 mg by mouth daily. , Disp: , Rfl:  .  simvastatin (ZOCOR) 20 MG tablet, SMARTSIG:1 Tablet(s) By Mouth Every Evening, Disp: , Rfl:  .  aspirin 81 MG tablet, Take 81 mg by mouth daily. (Patient not taking: Reported on 04/25/2020), Disp: , Rfl:    Family History  Problem Relation Age of Onset  . Heart disease Mother   . Heart disease Father   . Diabetes Brother   . Diabetes Brother   . Migraines Son      Social History   Tobacco Use  . Smoking status: Former Smoker    Packs/day: 1.00    Years: 20.00    Pack years: 20.00    Types: Cigarettes    Quit date: 04/16/1993    Years since quitting: 27.0  . Smokeless tobacco: Never Used  Substance Use Topics  . Alcohol use: Yes    Comment: "haven't drank since early 1980's"  . Drug use: No    Allergies as of 04/25/2020 - Review Complete 04/25/2020  Allergen Reaction Noted  . Codeine Other (See Comments) 04/17/2011  . Morphine and related Nausea And Vomiting and Other (See Comments) 04/17/2011  . Baclofen Nausea Only and Other (See Comments) 01/26/2013  . Metformin hcl Other (See Comments) 01/30/2020  . Penicillins Other (See Comments) 12/06/2013  . Sulfamethoxazole Itching 12/10/2017  . Elemental sulfur Itching 04/17/2011    Review of Systems:    All systems reviewed and negative except where noted in HPI.   Physical Exam:  BP (!) 153/90 (BP Location: Left Arm, Patient Position: Sitting, Cuff Size: Normal)   Pulse (!) 102   Temp 98.3 F (36.8 C) (Oral)   Wt 159 lb 8 oz (72.3 kg)   BMI 27.38 kg/m  No LMP recorded. Patient is postmenopausal.  General:   Alert,  Well-developed, well-nourished, pleasant and cooperative in NAD Head:  Normocephalic and atraumatic. Eyes:  Sclera clear, no icterus.   Conjunctiva pink. Ears:  Normal  auditory acuity. Nose:  No deformity, discharge, or lesions. Mouth:  No deformity or lesions,oropharynx pink & moist. Neck:  Supple; no masses or thyromegaly. Lungs:  Respirations even and unlabored.  Clear throughout to auscultation.   No wheezes, crackles, or rhonchi. No acute distress. Heart:  Regular rate and rhythm; no murmurs, clicks, rubs, or gallops. Abdomen:  Normal bowel sounds. Soft, non-tender and non-distended without masses, hepatosplenomegaly or hernias noted.  No guarding or rebound tenderness.   Rectal: Not performed Msk: Left upper and lower extremity weakness, patient appears left lower extremity orthotics Pulses:  Normal pulses noted. Extremities:  No clubbing or edema.  No cyanosis. Neurologic:  Alert and oriented x3;  grossly normal neurologically. Skin:  Intact without significant lesions or rashes. No jaundice. Psych:  Alert and cooperative. Normal mood and affect.  Imaging Studies: Reviewed  Assessment and Plan:  [name] is a 70 y.o. female with history of stroke resulting in left-sided hemiparesis, ileal resection in 1990s, with ileoileal anastomosis, no evidence of active Crohn's disease is seen in consultation for chronic constipation.  Patient has history of pelvic floor dyssynergy  Chronic constipation Discussed about high-fiber diet, information provided Fiber supplements regularly Adequate intake of water Patient developed diarrhea on low-dose Linzess 72 MCG daily.  She prefers to increase fiber intake at this time  History of iron deficiency and B12 deficiency anemia Recheck labs during next visit   Follow up in 3 months   Cephas Darby, MD

## 2020-04-30 DIAGNOSIS — E7800 Pure hypercholesterolemia, unspecified: Secondary | ICD-10-CM | POA: Diagnosis not present

## 2020-04-30 DIAGNOSIS — E039 Hypothyroidism, unspecified: Secondary | ICD-10-CM | POA: Diagnosis not present

## 2020-05-02 ENCOUNTER — Other Ambulatory Visit: Payer: Self-pay

## 2020-05-02 ENCOUNTER — Other Ambulatory Visit: Payer: HMO

## 2020-05-02 DIAGNOSIS — M216X2 Other acquired deformities of left foot: Secondary | ICD-10-CM | POA: Diagnosis not present

## 2020-05-02 DIAGNOSIS — E1142 Type 2 diabetes mellitus with diabetic polyneuropathy: Secondary | ICD-10-CM | POA: Diagnosis not present

## 2020-05-02 DIAGNOSIS — I69359 Hemiplegia and hemiparesis following cerebral infarction affecting unspecified side: Secondary | ICD-10-CM | POA: Diagnosis not present

## 2020-05-02 NOTE — Telephone Encounter (Signed)
Placed referral  

## 2020-05-03 ENCOUNTER — Telehealth: Payer: Self-pay | Admitting: Podiatry

## 2020-05-03 NOTE — Telephone Encounter (Signed)
Ptleft message stating she was there with EJ yesterday and got the brace and he cut the strap and it makes it a little hard for pt to put on by herself and is wanting to know if we could get a longer strap for her.  I returned call and told pt that I will have the girls in [place] look for the extra strap and if not I have one and she could pick up Monday in [place]. She asked if she could pick it up when she has her appt with Dr Prudence Davidson on 3.21.22. and I told her that was fine. I put a not in the appt information.

## 2020-05-14 ENCOUNTER — Other Ambulatory Visit: Payer: Self-pay

## 2020-05-14 ENCOUNTER — Encounter: Payer: Self-pay | Admitting: Podiatry

## 2020-05-14 ENCOUNTER — Ambulatory Visit: Payer: HMO | Admitting: Podiatry

## 2020-05-14 DIAGNOSIS — Z794 Long term (current) use of insulin: Secondary | ICD-10-CM

## 2020-05-14 DIAGNOSIS — B351 Tinea unguium: Secondary | ICD-10-CM | POA: Diagnosis not present

## 2020-05-14 DIAGNOSIS — M79674 Pain in right toe(s): Secondary | ICD-10-CM | POA: Diagnosis not present

## 2020-05-14 DIAGNOSIS — M21372 Foot drop, left foot: Secondary | ICD-10-CM

## 2020-05-14 DIAGNOSIS — M79675 Pain in left toe(s): Secondary | ICD-10-CM | POA: Diagnosis not present

## 2020-05-14 DIAGNOSIS — E1142 Type 2 diabetes mellitus with diabetic polyneuropathy: Secondary | ICD-10-CM

## 2020-05-14 NOTE — Progress Notes (Addendum)
This patient returns to my office for at risk foot care.  This patient requires this care by a professional since this patient will be at risk due to having diabetes mellitus and dropfoot left foot.  This patient is unable to cut nails herself since the patient cannot reach her nails.These nails are painful walking and wearing shoes.  This patient presents for at risk foot care today.  General Appearance  Alert, conversant and in no acute stress.  Vascular  Dorsalis pedis and posterior tibial  pulses are palpable  bilaterally.  Capillary return is within normal limits  bilaterally. Temperature is within normal limits  Bilaterally.  Swelling left leg and foot.  Neurologic  Senn-Weinstein monofilament wire test diminished   bilaterally. Muscle power within normal limits bilaterally.  Nails Thick disfigured discolored nails with subungual debris  from hallux to fifth toes bilaterally. No evidence of bacterial infection or drainage bilaterally.  Orthopedic  No crepitus or effusions noted.  No bony pathology or digital deformities noted.  Normal motion and strength right foot.  Limited ROM and muscle strength left foot.  Skin  normotropic skin with no porokeratosis noted bilaterally.  No signs of infections or ulcers noted.     Onychomycosis  Pain in right toes  Pain in left toes  Consent was obtained for treatment procedures.   Mechanical debridement of nails 1-5  bilaterally performed with a nail nipper.  Filed with dremel without incident.  Dsp. Anklet.  Patient to be called for diabetic shoes.   Return office visit   9 weeks                  Told patient to return for periodic foot care and evaluation due to potential at risk complications.   Gardiner Barefoot DPM

## 2020-05-21 ENCOUNTER — Telehealth: Payer: Self-pay | Admitting: Podiatry

## 2020-05-21 NOTE — Telephone Encounter (Signed)
Pt left message stating she received the replacement  strap for the brace but she needs it for the left foot and was given one for the right foot.  Left message I am trying to locate the one for the left foot and will call back as soon as I can with an update.

## 2020-05-28 ENCOUNTER — Other Ambulatory Visit: Payer: Self-pay

## 2020-05-28 ENCOUNTER — Encounter: Payer: Self-pay | Admitting: Dietician

## 2020-05-28 ENCOUNTER — Encounter: Payer: HMO | Attending: Gastroenterology | Admitting: Dietician

## 2020-05-28 VITALS — Wt 153.0 lb

## 2020-05-28 DIAGNOSIS — E119 Type 2 diabetes mellitus without complications: Secondary | ICD-10-CM | POA: Diagnosis not present

## 2020-05-28 DIAGNOSIS — Z794 Long term (current) use of insulin: Secondary | ICD-10-CM

## 2020-05-28 NOTE — Progress Notes (Signed)
Medical Nutrition Therapy: Visit start time: 1540  end time: 1640  Assessment:  Diagnosis: Type 2 diabetes Past medical history: CVA, HTN, constipation, diarrhea Psychosocial issues/ stress concerns: none  Preferred learning method:  . 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 She also reports history of gallbladder removal and small bowel resection.    She has increased fiber intake, and consumes 70-96oz fluids daily (water, 1c regular coffee, 3c decaf coffee)  She has also increased physical exercise   She reports recent HbA1C of 7.3%.   She would like to lose 10-15lbs, to goal of 140lbs.  Cooking is limited due to some paralysis since stroke. Patient does prepare all of her meals but some tasks and longer prep times are difficult.  Physical activity: walking with cane or walker 10 minutes, 3 times daily after meals  Dietary Intake:  Usual eating pattern includes 3 meals and 1-2 snacks per day. Dining out frequency: 0 meals per week.  Breakfast: steel cut oatmeal with cranberries and blueberries with unsweetened almond milk, adds 2Tbsp flaxseed, coffee Snack:  Lunch: pouch chicken with lemon pepper + orange + popcorn Snack: orange or berries 1c. Supper: varies -- turkey/ bison; 4/3 1 bowl chili made with bison meat, beans + 2 slices rye bread Snack: none or apple with peanut butter (when husband not at home, due to his nut allergy) Beverages: water, coffee  Nutrition Care Education: Topics covered:  Basic nutrition: appropriate nutrient balance   Weight control: controlled portions of starchy foods and meats, emphasis on low-carb vegetables and some fruits; limiting high fat foods; role of exercise; factors  such as medical conditions, impaired mobility that make weight loss more challenging Diabetes: appropriate meal and snack schedule, appropriate carb intake and balance, healthy carb choices, role of fiber, protein Hypertension:  importance of limiting high sodium foods GI symptoms: goal for fiber intake at 25-35grams and food sources of fiber; importance of adequate fluid intake with high fiber diet; avoiding excess iron, calcium; role of physical activity; foods that could cause constipation per patient question   Nutritional Diagnosis:  [place]-2.2 Altered nutrition-related laboratory As related to type 2 diabetes.  As evidenced by patient with recent HbA1C of 7.3%. [place]-1.4 Altered GI function As related to constipation.  As evidenced by patient reported symptoms.  Intervention:  . Instruction and discussion as noted above. . Patient has been working on diet and lifestyle changes to improve GI symptoms and blood sugar control, and she is motivated to continue. . Patient will further adjust food choices and portions to promote gut health, BG control, and weight loss.   Education Materials given:  . Plate Planner with food lists, sample meal pattern . Constipation nutrition therapy (AND) . Constipation Meal Planning Tips (AND)    Learner/ who was taught:  . Patient   Level of understanding: Marland Kitchen Verbalizes/ demonstrates competency   Demonstrated degree of understanding via:   Teach back Learning barriers: . None  Willingness to learn/ readiness for change: . Eager, change in progress   Monitoring and Evaluation:  Dietary intake, exercise, BG control, GI symptoms, and body weight      follow up: prn

## 2020-06-05 ENCOUNTER — Encounter: Payer: Self-pay | Admitting: Podiatry

## 2020-06-05 ENCOUNTER — Encounter: Payer: Self-pay | Admitting: Gastroenterology

## 2020-06-11 ENCOUNTER — Other Ambulatory Visit: Payer: HMO

## 2020-06-13 ENCOUNTER — Other Ambulatory Visit: Payer: HMO

## 2020-07-02 ENCOUNTER — Telehealth: Payer: HMO | Admitting: Dietician

## 2020-07-04 ENCOUNTER — Other Ambulatory Visit: Payer: Self-pay

## 2020-07-04 ENCOUNTER — Ambulatory Visit (INDEPENDENT_AMBULATORY_CARE_PROVIDER_SITE_OTHER): Payer: HMO

## 2020-07-04 DIAGNOSIS — M216X2 Other acquired deformities of left foot: Secondary | ICD-10-CM

## 2020-07-04 DIAGNOSIS — E1142 Type 2 diabetes mellitus with diabetic polyneuropathy: Secondary | ICD-10-CM

## 2020-07-04 DIAGNOSIS — M21372 Foot drop, left foot: Secondary | ICD-10-CM

## 2020-07-04 DIAGNOSIS — Z794 Long term (current) use of insulin: Secondary | ICD-10-CM

## 2020-07-12 ENCOUNTER — Encounter: Payer: Self-pay | Admitting: Gastroenterology

## 2020-07-19 ENCOUNTER — Ambulatory Visit: Payer: HMO | Admitting: Podiatry

## 2020-07-19 ENCOUNTER — Other Ambulatory Visit: Payer: Self-pay

## 2020-07-19 ENCOUNTER — Encounter: Payer: Self-pay | Admitting: Podiatry

## 2020-07-19 DIAGNOSIS — M79674 Pain in right toe(s): Secondary | ICD-10-CM | POA: Diagnosis not present

## 2020-07-19 DIAGNOSIS — M79675 Pain in left toe(s): Secondary | ICD-10-CM

## 2020-07-19 DIAGNOSIS — E1142 Type 2 diabetes mellitus with diabetic polyneuropathy: Secondary | ICD-10-CM

## 2020-07-19 DIAGNOSIS — Z794 Long term (current) use of insulin: Secondary | ICD-10-CM

## 2020-07-19 DIAGNOSIS — B351 Tinea unguium: Secondary | ICD-10-CM | POA: Diagnosis not present

## 2020-07-19 DIAGNOSIS — M21372 Foot drop, left foot: Secondary | ICD-10-CM | POA: Diagnosis not present

## 2020-07-19 NOTE — Progress Notes (Signed)
This patient returns to my office for at risk foot care.  This patient requires this care by a professional since this patient will be at risk due to having diabetes mellitus and dropfoot left foot.  This patient is unable to cut nails herself since the patient cannot reach her nails.These nails are painful walking and wearing shoes.  This patient presents for at risk foot care today.  General Appearance  Alert, conversant and in no acute stress.  Vascular  Dorsalis pedis and posterior tibial  pulses are palpable  bilaterally.  Capillary return is within normal limits  bilaterally. Temperature is within normal limits  Bilaterally.  Swelling left leg and foot.  Neurologic  Senn-Weinstein monofilament wire test diminished   bilaterally. Muscle power within normal limits bilaterally.  Nails Thick disfigured discolored nails with subungual debris  from hallux to fifth toes bilaterally. No evidence of bacterial infection or drainage bilaterally.  Orthopedic  No crepitus or effusions noted.  No bony pathology or digital deformities noted.  Normal motion and strength right foot.  Limited ROM and muscle strength left foot.  Skin  normotropic skin with no porokeratosis noted bilaterally.  No signs of infections or ulcers noted.     Onychomycosis  Pain in right toes  Pain in left toes  Consent was obtained for treatment procedures.   Mechanical debridement of nails 1-5  bilaterally performed with a nail nipper.  Filed with dremel without incident.     Return office visit   8 weeks                  Told patient to return for periodic foot care and evaluation due to potential at risk complications.   Gardiner Barefoot DPM

## 2020-07-30 ENCOUNTER — Ambulatory Visit: Payer: HMO | Admitting: Dermatology

## 2020-07-30 ENCOUNTER — Other Ambulatory Visit: Payer: Self-pay

## 2020-07-30 DIAGNOSIS — L853 Xerosis cutis: Secondary | ICD-10-CM

## 2020-07-30 DIAGNOSIS — T148XXA Other injury of unspecified body region, initial encounter: Secondary | ICD-10-CM

## 2020-07-30 DIAGNOSIS — L84 Corns and callosities: Secondary | ICD-10-CM | POA: Diagnosis not present

## 2020-07-30 DIAGNOSIS — L821 Other seborrheic keratosis: Secondary | ICD-10-CM | POA: Diagnosis not present

## 2020-07-30 DIAGNOSIS — L820 Inflamed seborrheic keratosis: Secondary | ICD-10-CM | POA: Diagnosis not present

## 2020-07-30 DIAGNOSIS — L304 Erythema intertrigo: Secondary | ICD-10-CM

## 2020-07-30 MED ORDER — MUPIROCIN 2 % EX OINT: TOPICAL_OINTMENT | CUTANEOUS | 0 refills | Status: DC

## 2020-07-30 NOTE — Progress Notes (Signed)
   New Patient Visit  Subjective  [name] is a 70 y.o. female who presents for the following: irregular/irritated skin lesions (On the R leg, L hand, R arm, and back - patient scratched off the lesion on her R arm but there is still some residual scale. The lesions on her back itch at times, and the lesion on her L hand is scaly. The lesion on the right leg is crusty and scabbed) and pain (From patient's underwear - patient states that she has tried different brands and different designs but her skin is painful in the areas that her underwear crease).  She is partially paralyzed from a stroke, she also has DM.  The following portions of the chart were reviewed this encounter and updated as appropriate:      Review of Systems:  No other skin or systemic complaints except as noted in HPI or Assessment and Plan.  Objective  Well appearing patient in no apparent distress; mood and affect are within normal limits.  A focused examination was performed including the trunk and extremities. Relevant physical exam findings are noted in the Assessment and Plan.  Objective  R upper arm x 1, mid back, L hand: Erythematous keratotic or waxy stuck-on macule/papule   Objective  R pretibial: Healing excoriation with bleeding  Objective  groin, axilla: Mild erythema  Objective  B/L elbow: Thickened skin with xerosis  Assessment & Plan  Inflamed seborrheic keratosis R upper arm x 1, mid back, L hand  Pt defers cryotherapy Recommend [place] Rough and Bumpy moisturizer, and Amlactin over the counter moisturizers to help smooth out.   Excoriation R pretibial  Likely inflamed seborrheic keratosis with recent excoriation- keep covered with Mupirocin 2% ointment and a band-aid until healed.  mupirocin ointment (BACTROBAN) 2 % - R pretibial  Erythema intertrigo groin, axilla  Intertrigo is a chronic recurrent rash that occurs in skin fold areas that may be associated with friction; heat;  moisture; yeast; fungus; and bacteria.  It is exacerbated by increased movement / activity; sweating; and higher atmospheric temperature.  Start Ketoconazole 2% cream apply to aa's QD-BID. Patient has at home already.   Recommend using donut shaped pillow when sitting for long periods of time to avoid skin irritation/pain on tailbone area to avoid pressure injury. Continue to get up and move around when possible  Skin callus B/L elbow  Recommend Amlactin 12% moisturizer daily or GB Rough and Bumpy  Seborrheic Keratoses  - Discussed treatment options of LN2 if bothersome - Stuck-on, waxy, tan-brown papules and/or plaques  - Benign-appearing - Discussed benign etiology and prognosis. - Observe - Call for any changes  Xerosis - diffuse xerotic patches - recommend gentle, hydrating skin care - gentle skin care handout given  Return if symptoms worsen or fail to improve.  Luther Redo, CMA, am acting as scribe for Brendolyn Patty, MD .  Documentation: I have reviewed the above documentation for accuracy and completeness, and I agree with the above.  Brendolyn Patty MD

## 2020-07-30 NOTE — Patient Instructions (Addendum)
If you have any questions or concerns for your doctor, please call our main line at [phone] and press option 4 to reach your doctor's medical assistant. If no one answers, please leave a voicemail as directed and we will return your call as soon as possible. Messages left after 4 pm will be answered the following business day.   You may also send [place] a message via [place]. We typically respond to MyChart messages within 1-2 business days.  For prescription refills, please ask your pharmacy to contact our office. Our fax number is [phone].  If you have an urgent issue when the clinic is closed that cannot wait until the next business day, you can page your doctor at the number below.    Please note that while we do our best to be available for urgent issues outside of office hours, we are not available 24/7.   If you have an urgent issue and are unable to reach [place], you may choose to seek medical care at your doctor's office, retail clinic, urgent care center, or emergency room.  If you have a medical emergency, please immediately call 911 or go to the emergency department.  Pager Numbers  - Dr. Nehemiah Massed: [phone]  - Dr. Laurence Ferrari: [phone]  - Dr. Nicole Kindred: [phone]  In the event of inclement weather, please call our main line at [phone] for an update on the status of any delays or closures.  Dermatology Medication Tips: Please keep the boxes that topical medications come in in order to help keep track of the instructions about where and how to use these. Pharmacies typically print the medication instructions only on the boxes and not directly on the medication tubes.   If your medication is too expensive, please contact our office at [phone] option 4 or send [place] a message through [place].   We are unable to tell what your co-pay for medications will be in advance as this is different depending on your insurance coverage. However, we may be able to find a substitute  medication at lower cost or fill out paperwork to get insurance to cover a needed medication.   If a prior authorization is required to get your medication covered by your insurance company, please allow [place] 1-2 business days to complete this process.  Drug prices often vary depending on where the prescription is filled and some pharmacies may offer cheaper prices.  The website www.goodrx.com contains coupons for medications through different pharmacies. The prices here do not account for what the cost may be with help from insurance (it may be cheaper with your insurance), but the website can give you the price if you did not use any insurance.  - You can print the associated coupon and take it with your prescription to the pharmacy.  - You may also stop by our office during regular business hours and pick up a GoodRx coupon card.  - If you need your prescription sent electronically to a different pharmacy, notify our office through [facility] or by phone at [phone] option 4. Gentle Skin Care Guide  1. Bathe no more than once a day.  2. Avoid bathing in hot water  3. Use a mild soap like Dove, Vanicream, Cetaphil, CeraVe. Can use Lever 2000 or Cetaphil antibacterial soap  4. Use soap only where you need it. On most days, use it under your arms, between your legs, and on your feet. Let the water rinse other areas unless visibly dirty.  5. When you  get out of the bath/shower, use a towel to gently blot your skin dry, don't rub it.  6. While your skin is still a little damp, apply a moisturizing cream such as Vanicream, CeraVe, Cetaphil, Eucerin, Sarna lotion or plain Vaseline Jelly. For hands apply Neutrogena [place] Hand Cream or Excipial Hand Cream.  7. Reapply moisturizer any time you start to itch or feel dry.  8. Sometimes using free and clear laundry detergents can be helpful. Fabric softener sheets should be avoided. Downy Free & Gentle liquid, or any liquid fabric  softener that is free of dyes and perfumes, it acceptable to use  9. If your doctor has given you prescription creams you may apply moisturizers over them

## 2020-08-08 ENCOUNTER — Telehealth: Payer: Self-pay | Admitting: Podiatry

## 2020-08-08 DIAGNOSIS — M7062 Trochanteric bursitis, left hip: Secondary | ICD-10-CM | POA: Diagnosis not present

## 2020-08-08 DIAGNOSIS — M76892 Other specified enthesopathies of left lower limb, excluding foot: Secondary | ICD-10-CM | POA: Diagnosis not present

## 2020-08-08 DIAGNOSIS — M5136 Other intervertebral disc degeneration, lumbar region: Secondary | ICD-10-CM | POA: Diagnosis not present

## 2020-08-08 DIAGNOSIS — M25552 Pain in left hip: Secondary | ICD-10-CM | POA: Diagnosis not present

## 2020-08-08 NOTE — Telephone Encounter (Signed)
Pt left message checking on status of diabetic shoes ordered.  I returned call and left message for pt that it looks like they should be shipping out later this week or beginning of next week and I will call pt when they come in to schedule an appt to pick them up.

## 2020-09-12 ENCOUNTER — Ambulatory Visit: Payer: HMO | Admitting: Neurology

## 2020-09-20 ENCOUNTER — Encounter: Payer: Self-pay | Admitting: Podiatry

## 2020-09-20 ENCOUNTER — Ambulatory Visit: Payer: HMO | Admitting: Podiatry

## 2020-09-20 ENCOUNTER — Other Ambulatory Visit: Payer: Self-pay

## 2020-09-20 DIAGNOSIS — Z794 Long term (current) use of insulin: Secondary | ICD-10-CM | POA: Diagnosis not present

## 2020-09-20 DIAGNOSIS — M79674 Pain in right toe(s): Secondary | ICD-10-CM

## 2020-09-20 DIAGNOSIS — M79675 Pain in left toe(s): Secondary | ICD-10-CM | POA: Diagnosis not present

## 2020-09-20 DIAGNOSIS — E1142 Type 2 diabetes mellitus with diabetic polyneuropathy: Secondary | ICD-10-CM | POA: Diagnosis not present

## 2020-09-20 DIAGNOSIS — M21372 Foot drop, left foot: Secondary | ICD-10-CM | POA: Diagnosis not present

## 2020-09-20 DIAGNOSIS — B351 Tinea unguium: Secondary | ICD-10-CM | POA: Diagnosis not present

## 2020-09-20 NOTE — Progress Notes (Signed)
This patient returns to my office for at risk foot care.  This patient requires this care by a professional since this patient will be at risk due to having diabetes mellitus and dropfoot left foot.  This patient is unable to cut nails herself since the patient cannot reach her nails.These nails are painful walking and wearing shoes.  This patient presents for at risk foot care today.  General Appearance  Alert, conversant and in no acute stress.  Vascular  Dorsalis pedis and posterior tibial  pulses are palpable  bilaterally.  Capillary return is within normal limits  bilaterally. Temperature is within normal limits  Bilaterally.  Swelling left leg and foot.  Neurologic  Senn-Weinstein monofilament wire test diminished   bilaterally. Muscle power within normal limits bilaterally.  Nails Thick disfigured discolored nails with subungual debris  from hallux to fifth toes bilaterally. No evidence of bacterial infection or drainage bilaterally.  Orthopedic  No crepitus or effusions noted.  No bony pathology or digital deformities noted.  Normal motion and strength right foot.  Limited ROM and muscle strength left foot.  Skin  normotropic skin with no porokeratosis noted bilaterally.  No signs of infections or ulcers noted.     Onychomycosis  Pain in right toes  Pain in left toes  Consent was obtained for treatment procedures.   Mechanical debridement of nails 1-5  bilaterally performed with a nail nipper.  Filed with dremel without incident.     Return office visit   8 weeks                  Told patient to return for periodic foot care and evaluation due to potential at risk complications.   Gardiner Barefoot DPM

## 2020-09-21 DIAGNOSIS — E519 Thiamine deficiency, unspecified: Secondary | ICD-10-CM | POA: Diagnosis not present

## 2020-09-21 DIAGNOSIS — G250 Essential tremor: Secondary | ICD-10-CM | POA: Diagnosis not present

## 2020-09-21 DIAGNOSIS — R519 Headache, unspecified: Secondary | ICD-10-CM | POA: Diagnosis not present

## 2020-09-21 DIAGNOSIS — E538 Deficiency of other specified B group vitamins: Secondary | ICD-10-CM | POA: Diagnosis not present

## 2020-09-21 DIAGNOSIS — E559 Vitamin D deficiency, unspecified: Secondary | ICD-10-CM | POA: Diagnosis not present

## 2020-09-21 DIAGNOSIS — R202 Paresthesia of skin: Secondary | ICD-10-CM | POA: Diagnosis not present

## 2020-09-21 DIAGNOSIS — I69359 Hemiplegia and hemiparesis following cerebral infarction affecting unspecified side: Secondary | ICD-10-CM | POA: Diagnosis not present

## 2020-09-21 DIAGNOSIS — H547 Unspecified visual loss: Secondary | ICD-10-CM | POA: Diagnosis not present

## 2020-09-21 DIAGNOSIS — Z8673 Personal history of transient ischemic attack (TIA), and cerebral infarction without residual deficits: Secondary | ICD-10-CM | POA: Diagnosis not present

## 2020-09-21 DIAGNOSIS — E531 Pyridoxine deficiency: Secondary | ICD-10-CM | POA: Diagnosis not present

## 2020-09-25 ENCOUNTER — Other Ambulatory Visit: Payer: Self-pay | Admitting: Neurology

## 2020-09-25 DIAGNOSIS — Z8673 Personal history of transient ischemic attack (TIA), and cerebral infarction without residual deficits: Secondary | ICD-10-CM

## 2020-10-02 ENCOUNTER — Ambulatory Visit: Payer: HMO

## 2020-10-02 DIAGNOSIS — E7800 Pure hypercholesterolemia, unspecified: Secondary | ICD-10-CM | POA: Diagnosis not present

## 2020-10-02 DIAGNOSIS — Z794 Long term (current) use of insulin: Secondary | ICD-10-CM | POA: Diagnosis not present

## 2020-10-02 DIAGNOSIS — E119 Type 2 diabetes mellitus without complications: Secondary | ICD-10-CM | POA: Diagnosis not present

## 2020-10-02 DIAGNOSIS — Z8673 Personal history of transient ischemic attack (TIA), and cerebral infarction without residual deficits: Secondary | ICD-10-CM | POA: Diagnosis not present

## 2020-10-02 DIAGNOSIS — E039 Hypothyroidism, unspecified: Secondary | ICD-10-CM | POA: Diagnosis not present

## 2020-10-03 ENCOUNTER — Ambulatory Visit: Admission: RE | Admit: 2020-10-03 | Payer: HMO | Source: Ambulatory Visit

## 2020-10-09 DIAGNOSIS — E119 Type 2 diabetes mellitus without complications: Secondary | ICD-10-CM | POA: Diagnosis not present

## 2020-10-09 DIAGNOSIS — E531 Pyridoxine deficiency: Secondary | ICD-10-CM | POA: Diagnosis not present

## 2020-10-09 DIAGNOSIS — E538 Deficiency of other specified B group vitamins: Secondary | ICD-10-CM | POA: Diagnosis not present

## 2020-10-09 DIAGNOSIS — E559 Vitamin D deficiency, unspecified: Secondary | ICD-10-CM | POA: Diagnosis not present

## 2020-10-09 DIAGNOSIS — E519 Thiamine deficiency, unspecified: Secondary | ICD-10-CM | POA: Diagnosis not present

## 2020-10-09 DIAGNOSIS — Z8673 Personal history of transient ischemic attack (TIA), and cerebral infarction without residual deficits: Secondary | ICD-10-CM | POA: Diagnosis not present

## 2020-10-09 DIAGNOSIS — E7800 Pure hypercholesterolemia, unspecified: Secondary | ICD-10-CM | POA: Diagnosis not present

## 2020-10-12 ENCOUNTER — Encounter: Payer: Self-pay | Admitting: Gastroenterology

## 2020-11-01 ENCOUNTER — Ambulatory Visit
Admission: RE | Admit: 2020-11-01 | Discharge: 2020-11-01 | Disposition: A | Discharge status: Home / Self Care | Payer: HMO | Source: Ambulatory Visit | Attending: Neurology | Admitting: Neurology

## 2020-11-01 ENCOUNTER — Observation Stay
Admission: EM | Admit: 2020-11-01 | Discharge: 2020-11-02 | Disposition: A | Discharge status: Home / Self Care | Payer: HMO | Attending: Internal Medicine | Admitting: Internal Medicine

## 2020-11-01 ENCOUNTER — Other Ambulatory Visit: Payer: Self-pay

## 2020-11-01 ENCOUNTER — Observation Stay: Payer: HMO

## 2020-11-01 DIAGNOSIS — I69398 Other sequelae of cerebral infarction: Secondary | ICD-10-CM

## 2020-11-01 DIAGNOSIS — I639 Cerebral infarction, unspecified: Secondary | ICD-10-CM | POA: Diagnosis present

## 2020-11-01 DIAGNOSIS — G8194 Hemiplegia, unspecified affecting left nondominant side: Secondary | ICD-10-CM | POA: Diagnosis not present

## 2020-11-01 DIAGNOSIS — R000 Tachycardia, unspecified: Secondary | ICD-10-CM | POA: Diagnosis not present

## 2020-11-01 DIAGNOSIS — E039 Hypothyroidism, unspecified: Secondary | ICD-10-CM | POA: Diagnosis not present

## 2020-11-01 DIAGNOSIS — R531 Weakness: Secondary | ICD-10-CM | POA: Diagnosis present

## 2020-11-01 DIAGNOSIS — I672 Cerebral atherosclerosis: Secondary | ICD-10-CM | POA: Diagnosis not present

## 2020-11-01 DIAGNOSIS — I63233 Cerebral infarction due to unspecified occlusion or stenosis of bilateral carotid arteries: Secondary | ICD-10-CM | POA: Diagnosis not present

## 2020-11-01 DIAGNOSIS — Z79899 Other long term (current) drug therapy: Secondary | ICD-10-CM | POA: Insufficient documentation

## 2020-11-01 DIAGNOSIS — E119 Type 2 diabetes mellitus without complications: Secondary | ICD-10-CM

## 2020-11-01 DIAGNOSIS — I69359 Hemiplegia and hemiparesis following cerebral infarction affecting unspecified side: Secondary | ICD-10-CM

## 2020-11-01 DIAGNOSIS — Z7982 Long term (current) use of aspirin: Secondary | ICD-10-CM | POA: Diagnosis not present

## 2020-11-01 DIAGNOSIS — Z87891 Personal history of nicotine dependence: Secondary | ICD-10-CM | POA: Insufficient documentation

## 2020-11-01 DIAGNOSIS — Z794 Long term (current) use of insulin: Secondary | ICD-10-CM | POA: Insufficient documentation

## 2020-11-01 DIAGNOSIS — Z8673 Personal history of transient ischemic attack (TIA), and cerebral infarction without residual deficits: Secondary | ICD-10-CM

## 2020-11-01 DIAGNOSIS — I10 Essential (primary) hypertension: Secondary | ICD-10-CM | POA: Diagnosis present

## 2020-11-01 DIAGNOSIS — I6612 Occlusion and stenosis of left anterior cerebral artery: Secondary | ICD-10-CM | POA: Diagnosis not present

## 2020-11-01 DIAGNOSIS — Z20822 Contact with and (suspected) exposure to covid-19: Secondary | ICD-10-CM | POA: Diagnosis not present

## 2020-11-01 DIAGNOSIS — I6603 Occlusion and stenosis of bilateral middle cerebral arteries: Secondary | ICD-10-CM | POA: Diagnosis not present

## 2020-11-01 DIAGNOSIS — M47812 Spondylosis without myelopathy or radiculopathy, cervical region: Secondary | ICD-10-CM | POA: Diagnosis not present

## 2020-11-01 LAB — DIFFERENTIAL
Abs Immature Granulocytes: 0.02 10*3/uL (ref 0.00–0.07)
Basophils Absolute: 0 10*3/uL (ref 0.0–0.1)
Basophils Relative: 1 %
Eosinophils Absolute: 0.1 10*3/uL (ref 0.0–0.5)
Eosinophils Relative: 2 %
Immature Granulocytes: 0 %
Lymphocytes Relative: 29 %
Lymphs Abs: 1.8 10*3/uL (ref 0.7–4.0)
Monocytes Absolute: 0.4 10*3/uL (ref 0.1–1.0)
Monocytes Relative: 6 %
Neutro Abs: 3.9 10*3/uL (ref 1.7–7.7)
Neutrophils Relative %: 62 %

## 2020-11-01 LAB — CBC
HCT: 38.8 % (ref 36.0–46.0)
HCT: 35.3 % — ABNORMAL LOW (ref 36.0–46.0)
Hemoglobin: 12 g/dL (ref 12.0–15.0)
Hemoglobin: 11.1 g/dL — ABNORMAL LOW (ref 12.0–15.0)
MCH: 25.8 pg — ABNORMAL LOW (ref 26.0–34.0)
MCH: 24.9 pg — ABNORMAL LOW (ref 26.0–34.0)
MCHC: 30.9 g/dL (ref 30.0–36.0)
MCHC: 31.4 g/dL (ref 30.0–36.0)
MCV: 83.3 fL (ref 80.0–100.0)
MCV: 79.3 fL — ABNORMAL LOW (ref 80.0–100.0)
Platelets: 81 10*3/uL — ABNORMAL LOW (ref 150–400)
Platelets: 230 10*3/uL (ref 150–400)
RBC: 4.66 MIL/uL (ref 3.87–5.11)
RBC: 4.45 MIL/uL (ref 3.87–5.11)
RDW: 15.8 % — ABNORMAL HIGH (ref 11.5–15.5)
RDW: 15.6 % — ABNORMAL HIGH (ref 11.5–15.5)
WBC: 6.3 10*3/uL (ref 4.0–10.5)
WBC: 5.9 10*3/uL (ref 4.0–10.5)
nRBC: 0 % (ref 0.0–0.2)
nRBC: 0 % (ref 0.0–0.2)

## 2020-11-01 LAB — COMPREHENSIVE METABOLIC PANEL
ALT: 17 U/L (ref 0–44)
AST: 27 U/L (ref 15–41)
Albumin: 4.4 g/dL (ref 3.5–5.0)
Alkaline Phosphatase: 46 U/L (ref 38–126)
Anion gap: 11 (ref 5–15)
BUN: 12 mg/dL (ref 8–23)
CO2: 23 mmol/L (ref 22–32)
Calcium: 8.9 mg/dL (ref 8.9–10.3)
Chloride: 101 mmol/L (ref 98–111)
Creatinine, Ser: 0.76 mg/dL (ref 0.44–1.00)
GFR, Estimated: >60 mL/min (ref >60)
Glucose, Bld: 230 mg/dL — ABNORMAL HIGH (ref 70–99)
Potassium: 3.7 mmol/L (ref 3.5–5.1)
Sodium: 135 mmol/L (ref 135–145)
Total Bilirubin: 0.6 mg/dL (ref 0.3–1.2)
Total Protein: 7.4 g/dL (ref 6.5–8.1)

## 2020-11-01 LAB — HIV ANTIBODY (ROUTINE TESTING W REFLEX): HIV Screen 4th Generation wRfx: NONREACTIVE

## 2020-11-01 LAB — APTT: aPTT: 36 seconds (ref 24–36)

## 2020-11-01 LAB — PROTIME-INR
INR: 1 (ref 0.8–1.2)
Prothrombin Time: 13 seconds (ref 11.4–15.2)

## 2020-11-01 LAB — CREATININE, SERUM
Creatinine, Ser: 0.63 mg/dL (ref 0.44–1.00)
GFR, Estimated: >60 mL/min (ref >60)

## 2020-11-01 LAB — GLUCOSE, CAPILLARY: Glucose-Capillary: 154 mg/dL — ABNORMAL HIGH (ref 70–99)

## 2020-11-01 MED ORDER — ACETAMINOPHEN 650 MG RE SUPP: 650.0000 mg | RECTAL | Status: DC | PRN

## 2020-11-01 MED ORDER — INSULIN ASPART 100 UNIT/ML IJ SOLN
0.0000 [IU] | Freq: Three times a day (TID) | INTRAMUSCULAR | Status: DC
  Administered 2020-11-02 (×2): 2 [IU] via SUBCUTANEOUS
  Filled 2020-11-01 (×2): qty 1

## 2020-11-01 MED ORDER — SODIUM CHLORIDE 0.9% FLUSH
3.0000 mL | Freq: Once | INTRAVENOUS | Status: AC
  Administered 2020-11-01: 3 mL via INTRAVENOUS

## 2020-11-01 MED ORDER — SENNOSIDES-DOCUSATE SODIUM 8.6-50 MG PO TABS: 1.0000 | ORAL_TABLET | Freq: Every evening | ORAL | Status: DC | PRN

## 2020-11-01 MED ORDER — IOHEXOL 350 MG/ML SOLN
75.0000 mL | Freq: Once | INTRAVENOUS | Status: AC | PRN
  Administered 2020-11-01: 75 mL via INTRAVENOUS

## 2020-11-01 MED ORDER — ASPIRIN 81 MG PO CHEW
324.0000 mg | CHEWABLE_TABLET | Freq: Once | ORAL | Status: AC
  Administered 2020-11-01: 324 mg via ORAL
  Filled 2020-11-01: qty 4

## 2020-11-01 MED ORDER — ATORVASTATIN CALCIUM 20 MG PO TABS
80.0000 mg | ORAL_TABLET | Freq: Every day | ORAL | Status: DC
  Administered 2020-11-01 – 2020-11-02 (×2): 80 mg via ORAL
  Filled 2020-11-01 (×2): qty 4

## 2020-11-01 MED ORDER — ASPIRIN 300 MG RE SUPP: 300.0000 mg | Freq: Every day | RECTAL | Status: DC

## 2020-11-01 MED ORDER — STROKE: EARLY STAGES OF RECOVERY BOOK: Freq: Once | Status: AC

## 2020-11-01 MED ORDER — ACETAMINOPHEN 160 MG/5ML PO SOLN
650.0000 mg | ORAL | Status: DC | PRN
  Filled 2020-11-01: qty 20.3

## 2020-11-01 MED ORDER — ASPIRIN 325 MG PO TABS
325.0000 mg | ORAL_TABLET | Freq: Every day | ORAL | Status: DC
  Filled 2020-11-01 (×2): qty 1

## 2020-11-01 MED ORDER — ACETAMINOPHEN 325 MG PO TABS: 650.0000 mg | ORAL_TABLET | ORAL | Status: DC | PRN

## 2020-11-01 MED ORDER — ENOXAPARIN SODIUM 40 MG/0.4ML IJ SOSY
40.0000 mg | PREFILLED_SYRINGE | INTRAMUSCULAR | Status: DC
  Administered 2020-11-01: 21:00:00 40 mg via SUBCUTANEOUS
  Filled 2020-11-01: qty 0.4

## 2020-11-01 NOTE — ED Triage Notes (Signed)
Pt to ED sent from Dr Brigitte Pulse for acute stroke showing on MRI that was taken today. Pt has left sided deficits from previous stroke, partial paralysis and partially blind. States was having routine MRI, denies any sx.  Clear speech.

## 2020-11-01 NOTE — ED Notes (Signed)
Spoke with EDP, states he will speak with the referring neurologist to see what kind of work he wants for the pt. Pt denies any new sx

## 2020-11-01 NOTE — Consult Note (Signed)
Neurology Consultation Reason for Consult: Punctate stroke on MRI Requesting Physician: Sharen Hones  CC: Stroke on MRI  History is obtained from: Patient and chart review  HPI: [name] is a 70 y.o. female with a past medical history significant for hypertension, hyperlipidemia, type 2 diabetes, prior right PCA stroke (05/19/2007 with residual right hemiparesis, near-total vision loss and optic ataxia), hypothyroidism, essential tremor, depression  Regarding her Crohn's disease history, including ileal resection she had an evaluation through [facility] including endoscopy and biopsies which were negative for active ongoing Crohn's disease despite no medical treatment for Crohn's in many years.  Dr. Cephas Darby noted "Her current bouts of diarrhea, constipation and fecal incontinence are likely multifactorial in origin. I suspect there could be a component of SIBO, bile acid diarrhea, irritable bowel syndrome and possible neurologic issues related to her prior cerebrovascular accident." (03/23/2019)  She reports no acute neurological complaints, and that the MRI obtained by Dr. Manuella Ghazi was for follow-up of her chronic issues.  She notes she has had longstanding intermittent palpitations since childhood but no acute complaints  She reports that at baseline she walks with a cane or Rollator, has not had a fall in 12 years.  Given her significant residual vision impairment she does not drive but otherwise she manages her own medications, finances, cooking and all other ADLs and IADLs in the home.  She is additionally writing a book about her prior stroke experience, and understandably remains quite upset that she was initially misdiagnosed as a migraine headache.  LKW: Unclear given asymptomatic stroke found incidentally on MRI tPA given?: No, out of the window and asymptomatic on presentation Premorbid modified rankin scale:      4 - Moderately severe disability. Unable to attend to own bodily needs without  assistance, and unable to walk unassisted.  ROS: All other review of systems was negative except as noted in the HPI.  Past Medical History:  Diagnosis Date   Abdominal pain 12/06/2013   Abnormality of gait 01/26/2013   Altered bowel habits 12/11/2017   Bruises easily    Constipation    Crohn's disease ([place])    Depression    Dizziness and giddiness 01/26/2013   Fecal incontinence alternating with constipation 12/11/2017   GERD (gastroesophageal reflux disease)    Hemianopia    left   Hemiparesis and alteration of sensations as late effects of stroke ([place]) 01/26/2013   Hyperlipidemia    Hypertension    Hypothyroidism    Lump in female breast    Panic disorder    PONV (postoperative nausea and vomiting)    Stroke ([place]) 2009   Tremor, essential 04/24/2020   Type II diabetes mellitus ([place])    Past Surgical History:  Procedure Laterality Date   ANAL FISSURE REPAIR  2008   APPENDECTOMY  1986   BREAST BIOPSY Left 2006   neg   BREAST LUMPECTOMY Right 1990's   CESAREAN SECTION  1981; [place]  05/1991   TUBAL LIGATION  1983     Current Meds  Medication Sig   aspirin 81 MG tablet Take 81 mg by mouth daily.   Cyanocobalamin (VITAMIN B12) 1000 MCG TBCR    insulin degludec (TRESIBA FLEXTOUCH) 100 UNIT/ML FlexTouch Pen Inject 36 Units into the skin daily.   ketoconazole (NIZORAL) 2 % cream Apply 1 application topically daily.   levothyroxine (SYNTHROID) 137 MCG tablet Take 137 mcg by mouth every morning.   Multiple Vitamins-Minerals (MULTIVITAMIN &  MINERAL) LIQD    mupirocin ointment (BACTROBAN) 2 % Apply to healing wound QD until healed.   pantoprazole (PROTONIX) 40 MG tablet Take 40 mg by mouth daily.    ramipril (ALTACE) 2.5 MG capsule Take 2.5 mg by mouth daily.    simvastatin (ZOCOR) 20 MG tablet SMARTSIG:1 Tablet(s) By Mouth Every Evening     Family History  Problem Relation Age of Onset   Heart disease Mother    Heart  disease Father    Diabetes Brother    Diabetes Brother    Migraines Son    Social History:  reports that she quit smoking about 27 years ago. Her smoking use included cigarettes. She has a 20.00 pack-year smoking history. She has never used smokeless tobacco. She reports that she does not currently use alcohol. She reports that she does not use drugs.   Exam: Current vital signs: BP (!) 147/81 (BP Location: Right Arm)   Pulse 70   Temp 98.3 F (36.8 C)   Resp 20   Wt 69.4 kg   SpO2 99%   BMI 26.26 kg/m  Vital signs in last 24 hours: Temp:  [98.3 F (36.8 C)-98.6 F (37 C)] 98.3 F (36.8 C) (09/08 1834) Pulse Rate:  [70-110] 70 (09/08 1834) Resp:  [20] 20 (09/08 1834) BP: (147-174)/(81-91) 147/81 (09/08 1834) SpO2:  [99 %] 99 % (09/08 1834) Weight:  [69.4 kg] 69.4 kg (09/08 1440)   Physical Exam  Constitutional: Appears well-developed and well-nourished.  Psych: Affect appropriate to situation, generally calm and cooperative, mildly irritable at times about being hospitalized and slightly tearful when discussing her prior stroke Eyes: No scleral injection HENT: No oropharyngeal obstruction.  MSK: no joint deformities.  Cardiovascular: Normal rate and regular rhythm.  Perfusing extremities well Respiratory: Effort normal, non-labored breathing GI: Soft.  No distension. There is no tenderness.  Skin: Warm dry and intact visible skin  Neuro: Mental Status: Patient is awake, alert, oriented to person, place, month, year, and situation. Patient is able to give a clear and coherent history. No signs of aphasia or neglect to double sided tactile stimulation (unable to assess visual secondary to her severe field cut) Cranial Nerves: II: Visual Fields are notable for spared vision only in the right lower quadrant. Pupils are equal, and round III,IV, VI: EOMI without ptosis or diploplia.  Pursuits are saccadic V: Facial sensation is approximately 20% reduced to light touch in  the left face VII: Facial movement is notable for a slight left facial droop.  VIII: hearing is intact to voice, though she does report slightly diminished hearing on the left which is baseline X: Uvula elevates symmetrically XI: Shoulder shrug is reduced on the left. XII: tongue is midline without atrophy or fasciculations.  Motor: Tone is spastic on the right, worse in the arm than leg. Bulk is normal.  She is 5/5 on the right with no pronator drift of the right upper extremity, left upper extremity has contractures at the shoulder and the elbow, and mild hip weakness 4+/5 as well as knee flexion weakness 4/5 Sensory: Sensation is reduced by about 20% on the left face arm and leg Deep Tendon Reflexes:  3+ brachioradialis but brisker on the left compared to the right Cerebellar: FNF and HKS are intact bilaterally within limits of weakness (left upper extremity not tested given her contractures) Gait: Steady stance when arose briefly, limited eval due to monitors but patient reports gait at baseline and no falls in 12 years  NIHSS  total 4 --all chronic, 0 points for new symptoms Score breakdown: One-point for slight left facial droop, 2 points for left upper extremity weakness, one-point for reduced sensation in the left arm    I have reviewed labs in epic and the results pertinent to this consultation are:  Lab Results  Component Value Date   HGBA1C 7.4 (H) 12/06/2013    Lab Results  Component Value Date   CHOL 164 11/02/2020   HDL 37 (L) 11/02/2020   LDLCALC 47 11/02/2020   TRIG 398 (H) 11/02/2020   CHOLHDL 4.4 11/02/2020     I have reviewed the images obtained: MRI brain personally reviewed, left temporal punctate stroke and possible right thalamic more subacute stroke vs more likely T2 shinethrough   CTA head and neck personally reviewed, agree with radiology read as below CT head: 1. A possible small acute infarct within the left temporal lobe was better appreciated  on the brain MRI performed earlier today. 2. Redemonstrated large chronic right PCA territory infarct. 3. Redemonstrated chronic small-vessel infarcts within the left frontal lobe periventricular white matter and bilateral deep gray nuclei. 4. Background moderate chronic small-vessel ischemic changes within the cerebral white matter. 5. Mild generalized parenchymal atrophy. 6. Right mastoid effusion.   CTA neck: The common carotid, internal carotid and vertebral arteries are patent within the neck without hemodynamically significant stenosis (50% or greater). Minimal soft and calcified plaque within the left carotid bifurcation.   CTA head: 1. No acute intracranial large vessel occlusion identified. 2. Since the prior CTA head/neck of 05/24/2007, there has been some interval reconstitution of flow within the right posterior cerebral artery, but this vessel remains diminutive and irregular. 3. Progressive intracranial atherosclerotic disease elsewhere, most notably, there are progressive moderate/severe stenoses within a superior division mid M2 right MCA vessel, and a new severe stenosis within a proximal M2 left MCA vessel. 4. 1 mm inferiorly projecting vascular protrusions arising from the supraclinoid internal carotid arteries bilaterally, which may reflect infundibula or small aneurysms.  Impression: Cryptogenic punctate right temporal stroke, favor atheroembolic though potentially could be cardioembolic versus small vessel disease  Recommendations: # Punctate right temporal stroke, embolic of undetermined source - Stroke labs HgbA1c pending, goal < 7.0% - Frequent neuro checks - Echocardiogram pending, neurology will follow this up otherwise available on an as needed basis) - ASA 81 mg daily - Plavix 300 mg load with 75 mg daily for 90 day course (ordered) given severe stenosis of the M2 vessel - LDL meeting goal of < 70, no change in home statin - Risk factor  modification - Telemetry monitoring; 30 day event monitor on discharge if no arrythmias captured versus potential for loop recorder (patient wishes to discuss further with cardiology, recommend loop based on CRYSTAL AF trial and embolic stroke of undetermined source now 2nd event, unless echocardiogram pending already reveals an indication for anticoagulation) - Blood pressure goal: normotension goal long term, patient tolerating this well in the hospital  - No need for PT consult, OT consult, Speech consult given patient is at baseline - Patient requesting d/c falls precautions, reasonable - Continue outpatient follow-up with Dr. Sula Soda MD-PhD [facility] [phone] [facility] coverage for [facility] is from 8 AM to 4 AM in-house and 4 PM to 8 PM by telephone/video. 8 PM to 8 AM emergent questions or overnight urgent questions should be addressed to Teleneurology On-call or [facility] neurohospitalist; contact information can be found on [facility]ION  Patient with discussed with primary  team via secure chat Dr. Manuella Ghazi in person

## 2020-11-01 NOTE — ED Notes (Addendum)
Pt reports feeling a new tingling sensation to left side of face. Pt also reports her vision being "dimmer". Zhang MD made aware.

## 2020-11-01 NOTE — ED Provider Notes (Signed)
[facility] Emergency Department Provider Note   ____________________________________________   Event Date/Time   First MD Initiated Contact with Patient 11/01/20 1557     (approximate)  I have reviewed the triage vital signs and the nursing notes.   HISTORY  Chief Complaint Cerebrovascular Accident    HPI [name] is a 70 y.o. female history of previous stroke with left-sided weakness, diabetes, hypertension  Patient had an outpatient MRI she reports she had a has her neurologist that ordered several weeks ago as he was concerned that she might be having small strokes at times.  She received a call today that she had had a stroke on the MRI which was done today.  She reports has been in her normal health though it if anything she has not noticed any single symptoms of stroke except she does report for about 2 or 3 days may be that she is felt like at times her thoughts have gotten ahead of her hands when typing her book on her keyboard  No pain or discomfort.  Has sharp stabbing headaches from time to time which are chronic and not new, presently no headache  No numbness or weakness except for chronic findings located at left arm and left leg and also some mild neglect on the left side which she reports to be chronic  Takes aspirin daily took a baby aspirin this morning  Sees Dr. Manuella Ghazi of neurology  Past Medical History:  Diagnosis Date   Abdominal pain 12/06/2013   Abnormality of gait 01/26/2013   Altered bowel habits 12/11/2017   Bruises easily    Constipation    Crohn's disease ([place])    Depression    Dizziness and giddiness 01/26/2013   Fecal incontinence alternating with constipation 12/11/2017   GERD (gastroesophageal reflux disease)    Hemianopia    left   Hemiparesis and alteration of sensations as late effects of stroke ([place]) 01/26/2013   Hyperlipidemia    Hypertension    Hypothyroidism    Lump in female breast    Panic disorder     PONV (postoperative nausea and vomiting)    Stroke ([place]) 2009   Tremor, essential 04/24/2020   Type II diabetes mellitus ([place])     Patient Active Problem List   Diagnosis Date Noted   CVA (cerebral vascular accident) ([place]) 11/01/2020   Chronic pain 04/25/2020   Chronic constipation 04/25/2020   Gastro-esophageal reflux disease without esophagitis 04/25/2020   Hardening of the aorta (main artery of the heart) ([place]) 04/25/2020   History of iron deficiency anemia 04/25/2020   Hypercholesteremia 04/25/2020   Hypertension 04/25/2020   Personal history of transient ischemic attack (TIA), and cerebral infarction without residual deficits 04/25/2020   Pulmonary emphysema ([place]) 04/25/2020   Vitamin D deficiency 04/25/2020   Tremor, essential 04/24/2020   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   Right lower quadrant abdominal pain 08/18/2018   Spastic hemiplegia affecting nondominant side ([place]) 02/03/2014   Diabetes mellitus ([place]) 09/07/2013   Hypothyroidism 09/07/2013   Type 2 diabetes mellitus without complications ([place]) 15/17/6160   Hemiparesis and alteration of sensations as late effects of stroke ([place]) 01/26/2013   Abnormality of gait 01/26/2013   Dizziness and giddiness 01/26/2013    Past Surgical History:  Procedure Laterality Date   ANAL FISSURE REPAIR  2008   APPENDECTOMY  1986   BREAST BIOPSY Left 2006   neg   BREAST LUMPECTOMY Right 1990's   CESAREAN SECTION  1981; 1983  CHOLECYSTECTOMY  1986   SMALL INTESTINE SURGERY  05/1991   TUBAL LIGATION  1983    Prior to Admission medications   Medication Sig Start Date End Date Taking? Authorizing Provider  aspirin 81 MG tablet Take 81 mg by mouth daily.    [provider]  colestipol (COLESTID) 1 g tablet     [provider]  Cyanocobalamin (VITAMIN B12) 1000 MCG TBCR  01/31/20   [provider]  diazepam (VALIUM) 2 MG tablet Take 1 tablet (2 mg total) by mouth at bedtime. 04/24/20   Kathrynn Ducking, MD  glucose blood (ACCU-CHEK GUIDE) test strip Check blood sugar 05/09/20   [provider]  insulin degludec (TRESIBA FLEXTOUCH) 100 UNIT/ML FlexTouch Pen Inject 36 Units into the skin daily.    [provider]  Insulin Pen Needle (PEN NEEDLES) 32G X 4 MM MISC BD Nano 2nd Gen Pen Needle 32 gauge x 5/32"  USE DAILY WITH INSULIN    [provider]  ketoconazole (NIZORAL) 2 % cream Apply 1 application topically daily. 03/07/20   Criselda Peaches, DPM  levothyroxine (SYNTHROID) 125 MCG tablet Take 125 mcg by mouth daily. 03/20/20   [provider]  levothyroxine (SYNTHROID) 137 MCG tablet Take 137 mcg by mouth every morning. 01/31/20   [provider]  Multiple Vitamins-Minerals (MULTIVITAMIN & MINERAL) LIQD     [provider]  mupirocin ointment (BACTROBAN) 2 % Apply to healing wound QD until healed. 07/30/20   Brendolyn Patty, MD  pantoprazole (PROTONIX) 40 MG tablet Take 40 mg by mouth daily.  04/01/11   [provider]  ramipril (ALTACE) 2.5 MG capsule Take 2.5 mg by mouth daily.  04/01/11   [provider]  sennosides-docusate sodium (SENOKOT-S) 8.6-50 MG tablet     [provider]  simvastatin (ZOCOR) 20 MG tablet SMARTSIG:1 Tablet(s) By Mouth Every Evening 02/04/20   [provider]    Allergies Codeine, Morphine and related, Baclofen, Metformin hcl, Penicillins, Sulfamethoxazole, and Elemental sulfur  Family History  Problem Relation Age of Onset   Heart disease Mother    Heart disease Father    Diabetes Brother    Diabetes Brother    Migraines Son     Social History Social History   Tobacco Use   Smoking status: Former    Packs/day: 1.00    Years: 20.00    Pack years: 20.00    Types: Cigarettes    Quit date: 04/16/1993    Years since quitting: 27.5   Smokeless tobacco: Never  Substance Use Topics   Alcohol use: Not Currently    Comment: "haven't drank since early 1980's"   Drug  use: No    Review of Systems Constitutional: No fever/chills Eyes: No visual changes subserved from chronic hemianopsia. ENT: No sore throat. Cardiovascular: Denies chest pain. Respiratory: Denies shortness of breath. Gastrointestinal: No abdominal pain.   Genitourinary: Negative for dysuria. Musculoskeletal: Negative for back pain.  Some tremors she developed over the last few months in her hands mild Skin: Negative for rash. Neurological: Negative for new headaches, areas of focal weakness or numbness except as chronic.    ____________________________________________   PHYSICAL EXAM:  VITAL SIGNS: ED Triage Vitals  Enc Vitals Group     BP 11/01/20 1439 (!) 174/91     Pulse Rate 11/01/20 1439 (!) 110     Resp 11/01/20 1439 20     Temp 11/01/20 1439 98.6 F (37 C)     Temp  Source 11/01/20 1439 Oral     SpO2 11/01/20 1439 99 %     Weight 11/01/20 1440 153 lb (69.4 kg)     Height --      Head Circumference --      Peak Flow --      Pain Score 11/01/20 1439 3     Pain Loc --      Pain Edu? --      Excl. in [place]? --     Constitutional: Alert and oriented. Well appearing and in no acute distress.  Very pleasant husband at bedside very pleasant. Eyes: Conjunctivae are normal. Head: Atraumatic. Nose: No congestion/rhinnorhea. Mouth/Throat: Mucous membranes are moist. Neck: No stridor.  Cardiovascular: Normal rate, regular rhythm. Grossly normal heart sounds.  Good peripheral circulation. Respiratory: Normal respiratory effort.  No retractions. Lungs CTAB. Gastrointestinal: Soft and nontender. No distention. Musculoskeletal: No lower extremity tenderness nor edema. Neurologic:  Normal speech and language.  Some contracture and weakness left upper extremity left lower extremity which patient reports are chronic and not new.  No noted right-sided weakness of the right leg or right arm.  No pronator drift upper extremities bilateral.  Speech is clear thought patterns clear no  dysarthria or aphasia. No numbness over the right side of the body reports some numbness left side which is chronic Very mild difficulty focusing at times on items on her left side of her body which she reports to be chronic and husband affirms same Skin:  Skin is warm, dry and intact. No rash noted. Psychiatric: Mood and affect are normal. Speech and behavior are normal.  ____________________________________________   LABS (all labs ordered are listed, but only abnormal results are displayed)  Labs Reviewed  CBC - Abnormal; Notable for the following components:      Result Value   MCH 25.8 (*)    RDW 15.8 (*)    Platelets 81 (*)    All other components within normal limits  COMPREHENSIVE METABOLIC PANEL - Abnormal; Notable for the following components:   Glucose, Bld 230 (*)    All other components within normal limits  SARS CORONAVIRUS 2 (TAT 6-24 HRS)  DIFFERENTIAL  PROTIME-INR  APTT   ____________________________________________  EKG  Is reviewed throughout me at 1510 Heart rate 100 QRS 70 QTc 470 Sinus tachycardia no ischemia denoted ____________________________________________  RADIOLOGY  MR BRAIN WO CONTRAST  Result Date: 11/01/2020 CLINICAL DATA:  CVA in 2009, needs updated scan EXAM: MRI HEAD WITHOUT CONTRAST TECHNIQUE: Multiplanar, multiecho pulse sequences of the brain and surrounding structures were obtained without intravenous contrast. COMPARISON:  Brain MRI 05/24/2007 FINDINGS: Brain: There is a small focus of diffusion restriction in the left superior temporal gyrus. There is a small focus of increased DWI signal in the right thalamus without convincing low signal on the ADC map likely reflecting T2 shine through. There is a large remote PCA territorial infarct with associated encephalomalacia ex vacuo dilatation of the right lateral ventricle. There are small remote lacunar infarcts in the bilateral basal ganglia. There is moderate parenchymal volume loss.  Additional extensive FLAIR signal abnormality throughout the remainder of the subcortical and periventricular white matter likely reflects advanced chronic white matter microangiopathy. There is no evidence of acute intracranial hemorrhage or extra-axial fluid collection. There is no mass lesion. There is no midline shift. Vascular: Normal flow voids. Skull and upper cervical spine: Normal marrow signal. Sinuses/Orbits: In the paranasal sinuses are clear. The globes and orbits are unremarkable. Other: There is a right  mastoid effusion. IMPRESSION: 1. Small acute to subacute lacunar infarct in the left temporal lobe. 2. Remote right PCA territorial infarct and multiple additional small remote lacunar infarcts as above. 3. Moderate global parenchymal volume loss and advanced chronic white matter microangiopathy. 4. Right mastoid effusion. These results will be called to the ordering clinician or representative by the Radiologist Assistant, and communication documented in the PACS or [employer]. Electronically Signed   By: Valetta Mole M.D.   On: 11/01/2020 12:55    MRI of the brain reviewed, key findings as outlined as above ____________________________________________   PROCEDURES  Procedure(s) performed: None  Procedures  Critical Care performed: No  ____________________________________________   INITIAL IMPRESSION / ASSESSMENT AND PLAN / ED COURSE  Pertinent labs & imaging results that were available during my care of the patient were reviewed by me and considered in my medical decision making (see chart for details).  Patient presents for concerns of an MRI that demonstrated an acute stroke today.  Review of her MRI today does show concerns for an acute ischemic stroke of the left temporal region.  Of note however the patient does not denote any obvious acute stroke symptoms and this MRI was ordered on a outpatient basis.  She does however do note for 2 days perhaps some slight change in  how typing goes as though her thoughts race ahead of her hands.  However her symptoms nonspecific risk not by my exam any clear evidence of a new stroke but certainly MRI does show she has a history of stroke  I denies any associated recent illness infectious symptoms etc.  Blood pressure is elevated but in the setting of a potential acute stroke I will continue to monitor at this time  Will give additional salicylate.  I have discussed the case with neurology Dr. Curly Shores, she advises she will consult on patient and leave note in the chart this evening with the plan to see the patient in person tomorrow.  Patient is well outside of any tPA window as the exact symptomatology of stroke is not clear, and if anything her symptoms she noticed typing are at least 80 days old.  Nothing acute in the last 24 hours.  MRI does not denote finding that would be consistent with an LVO  Discussed with the patient husband both understand agreeable plan for admission.  Labs reviewed, chemistry CBC generally unremarkable regard to her acute finding.  Mild thrombocytopenia is noted though.  MRI of the brain reviewed.  EKG reviewed which appears nonischemic  ----------------------------------------- 4:34 PM on 11/01/2020 ----------------------------------------- Admission discussed with Dr. Roosevelt Locks      ____________________________________________   FINAL CLINICAL IMPRESSION(S) / ED DIAGNOSES  Final diagnoses:  Acute stroke due to ischemia [facility])        Note:  This document was prepared using Dragon voice recognition software and may include unintentional dictation errors       Delman Kitten, MD 11/01/20 1658

## 2020-11-01 NOTE — H&P (Signed)
Patient came from home, at the baseline, she can only walk for few steps with a cane. Chief Complaint: Stroke HPI:  [name] is a 70 year old female with multiple medical problems including Crohn's disease, depression, acid reflux, prior stroke with left-sided hemiparesis, essential hypertension, hypothyroidism, type 2 diabetes who was sent to the hospital by her neurologist for stroke. Patient had a first stroke in 2009, after that, she sustained severe left-sided hemiparesis.  She can only walk a few steps with a cane.  She went to her neurologist for routine visit, she was sent for MRI to check if she had a stroke between 2009 and then down.  The MRI came back with a left temporal lobe stroke. Other than mild headache, right hand tremor for 2 months, she does not have any other complaints to suspect a stroke.  Past Medical History:  Diagnosis Date   Abdominal pain 12/06/2013   Abnormality of gait 01/26/2013   Altered bowel habits 12/11/2017   Bruises easily    Constipation    Crohn's disease ([place])    Depression    Dizziness and giddiness 01/26/2013   Fecal incontinence alternating with constipation 12/11/2017   GERD (gastroesophageal reflux disease)    Hemianopia    left   Hemiparesis and alteration of sensations as late effects of stroke ([place]) 01/26/2013   Hyperlipidemia    Hypertension    Hypothyroidism    Lump in female breast    Panic disorder    PONV (postoperative nausea and vomiting)    Stroke ([place]) 2009   Tremor, essential 04/24/2020   Type II diabetes mellitus ([place])     Past Surgical History:  Procedure Laterality Date   ANAL FISSURE REPAIR  2008   APPENDECTOMY  1986   BREAST BIOPSY Left 2006   neg   BREAST LUMPECTOMY Right 1990's   CESAREAN SECTION  1981; [place]  05/1991   TUBAL LIGATION  1983    Family History  Problem Relation Age of Onset   Heart disease Mother    Heart disease Father    Diabetes Brother     Diabetes Brother    Migraines Son    Social History:  reports that she quit smoking about 27 years ago. Her smoking use included cigarettes. She has a 20.00 pack-year smoking history. She has never used smokeless tobacco. She reports that she does not currently use alcohol. She reports that she does not use drugs.  Allergies:  Allergies  Allergen Reactions   Codeine Other (See Comments)    Syncope.   Morphine And Related Nausea And Vomiting and Other (See Comments)    Hallucinations.   Baclofen Nausea Only and Other (See Comments)    dizziness   Metformin Hcl Other (See Comments)   Penicillins Other (See Comments)    Family history   Sulfamethoxazole Itching    Hands itching    Elemental Sulfur Itching    Per patient happened years ago.    (Not in a hospital admission)   Results for orders placed or performed during the hospital encounter of 11/01/20 (from the past 48 hour(s))  CBC     Status: Abnormal   Collection Time: 11/01/20  2:50 PM  Result Value Ref Range   WBC 6.3 4.0 - 10.5 K/uL   RBC 4.66 3.87 - 5.11 MIL/uL   Hemoglobin 12.0 12.0 - 15.0 g/dL   HCT 38.8 36.0 - 46.0 %   MCV 83.3 80.0 -  100.0 fL   MCH 25.8 (L) 26.0 - 34.0 pg   MCHC 30.9 30.0 - 36.0 g/dL   RDW 15.8 (H) 11.5 - 15.5 %   Platelets 81 (L) 150 - 400 K/uL    Comment: Immature Platelet Fraction may be clinically indicated, consider ordering this additional test [id number] PLATELETS APPEAR DECREASED    nRBC 0.0 0.0 - 0.2 %    Comment: Performed at [facility], [address] [place], [place] [address]  Differential     Status: None   Collection Time: 11/01/20  2:50 PM  Result Value Ref Range   Neutrophils Relative % 62 %   Neutro Abs 3.9 1.7 - 7.7 K/uL   Lymphocytes Relative 29 %   Lymphs Abs 1.8 0.7 - 4.0 K/uL   Monocytes Relative 6 %   Monocytes Absolute 0.4 0.1 - 1.0 K/uL   Eosinophils Relative 2 %   Eosinophils Absolute 0.1 0.0 - 0.5 K/uL   Basophils Relative 1 %   Basophils  Absolute 0.0 0.0 - 0.1 K/uL   Immature Granulocytes 0 %   Abs Immature Granulocytes 0.02 0.00 - 0.07 K/uL    Comment: Performed at [facility], [address] [place], [place] [address]  Comprehensive metabolic panel     Status: Abnormal   Collection Time: 11/01/20  2:50 PM  Result Value Ref Range   Sodium 135 135 - 145 mmol/L   Potassium 3.7 3.5 - 5.1 mmol/L   Chloride 101 98 - 111 mmol/L   CO2 23 22 - 32 mmol/L   Glucose, Bld 230 (H) 70 - 99 mg/dL    Comment: Glucose reference range applies only to samples taken after fasting for at least 8 hours.   BUN 12 8 - 23 mg/dL   Creatinine, Ser 0.76 0.44 - 1.00 mg/dL   Calcium 8.9 8.9 - 10.3 mg/dL   Total Protein 7.4 6.5 - 8.1 g/dL   Albumin 4.4 3.5 - 5.0 g/dL   AST 27 15 - 41 U/L   ALT 17 0 - 44 U/L   Alkaline Phosphatase 46 38 - 126 U/L   Total Bilirubin 0.6 0.3 - 1.2 mg/dL   GFR, Estimated >60 >60 mL/min    Comment: (NOTE) Calculated using the CKD-EPI Creatinine Equation (2021)    Anion gap 11 5 - 15    Comment: Performed at [facility], [address] [place], [place] [address]  Protime-INR     Status: None   Collection Time: 11/01/20  3:34 PM  Result Value Ref Range   Prothrombin Time 13.0 11.4 - 15.2 seconds   INR 1.0 0.8 - 1.2    Comment: (NOTE) INR goal varies based on device and disease states. Performed at [facility], [address] [place], [place] [address]   APTT     Status: None   Collection Time: 11/01/20  3:34 PM  Result Value Ref Range   aPTT 36 24 - 36 seconds    Comment: Performed at [facility], [address] [place], [place] [address]   MR BRAIN WO CONTRAST  Result Date: 11/01/2020 CLINICAL DATA:  CVA in 2009, needs updated scan EXAM: MRI HEAD WITHOUT CONTRAST TECHNIQUE: Multiplanar, multiecho pulse sequences of the brain and surrounding structures were obtained without intravenous contrast. COMPARISON:  Brain MRI 05/24/2007 FINDINGS: Brain: There is a small  focus of diffusion restriction in the left superior temporal gyrus. There is a small focus of increased DWI signal in the right thalamus  without convincing low signal on the ADC map likely reflecting T2 shine through. There is a large remote PCA territorial infarct with associated encephalomalacia ex vacuo dilatation of the right lateral ventricle. There are small remote lacunar infarcts in the bilateral basal ganglia. There is moderate parenchymal volume loss. Additional extensive FLAIR signal abnormality throughout the remainder of the subcortical and periventricular white matter likely reflects advanced chronic white matter microangiopathy. There is no evidence of acute intracranial hemorrhage or extra-axial fluid collection. There is no mass lesion. There is no midline shift. Vascular: Normal flow voids. Skull and upper cervical spine: Normal marrow signal. Sinuses/Orbits: In the paranasal sinuses are clear. The globes and orbits are unremarkable. Other: There is a right mastoid effusion. IMPRESSION: 1. Small acute to subacute lacunar infarct in the left temporal lobe. 2. Remote right PCA territorial infarct and multiple additional small remote lacunar infarcts as above. 3. Moderate global parenchymal volume loss and advanced chronic white matter microangiopathy. 4. Right mastoid effusion. These results will be called to the ordering clinician or representative by the Radiologist Assistant, and communication documented in the PACS or [employer]. Electronically Signed   By: Valetta Mole M.D.   On: 11/01/2020 12:55    Review of Systems  Constitutional:  Negative for activity change, fatigue and fever.  HENT:  Negative for postnasal drip, sneezing and sore throat.   Eyes:  Negative for pain and itching.  Respiratory:  Negative for cough, chest tightness and shortness of breath.   Cardiovascular:  Negative for chest pain, palpitations and leg swelling.  Gastrointestinal:  Positive for nausea. Negative  for abdominal pain, constipation and diarrhea.  Endocrine: Negative for cold intolerance and polydipsia.  Genitourinary:  Negative for dysuria, frequency and hematuria.  Musculoskeletal:  Negative for back pain and joint swelling.  Neurological:  Positive for headaches. Negative for dizziness, seizures and syncope.  Psychiatric/Behavioral:  Negative for confusion and hallucinations.    Blood pressure (!) 174/91, pulse (!) 110, temperature 98.6 F (37 C), temperature source Oral, resp. rate 20, weight 69.4 kg, SpO2 99 %. Physical Exam Constitutional:      General: She is not in acute distress.    Appearance: Normal appearance. She is not ill-appearing.  HENT:     Head: Normocephalic and atraumatic.     Nose: Nose normal.  Eyes:     Conjunctiva/sclera: Conjunctivae normal.     Pupils: Pupils are equal, round, and reactive to light.  Cardiovascular:     Rate and Rhythm: Normal rate and regular rhythm.     Heart sounds: No murmur heard.   No friction rub. No gallop.  Pulmonary:     Effort: Pulmonary effort is normal.     Breath sounds: Normal breath sounds. No wheezing or rales.  Abdominal:     General: Abdomen is flat. Bowel sounds are normal.     Palpations: Abdomen is soft.     Tenderness: There is no abdominal tenderness.  Musculoskeletal:        General: Tenderness present. No swelling. Normal range of motion.     Cervical back: Normal range of motion and neck supple. No rigidity.  Skin:    General: Skin is warm and dry.     Coloration: Skin is not jaundiced.  Neurological:     Mental Status: She is alert. Mental status is at baseline.     Comments: She has left leg and left arm weakness.  She also has increased DTR in right arm.  She has visual  field at lateral area in left eye.  Psychiatric:        Mood and Affect: Mood normal.        Thought Content: Thought content normal.     Assessment/Plan #1.  Acute left temporal stroke. Reviewed MRI results, patient has new  left temporal stroke. Neurology consult has been obtained. Will start stroke work-up including CT angiogram of the neck and head, echocardiogram.  We will also check lipid panel, TSH and HB A1c. Patient be also placed on telemetry. Start aspirin, Lipitor. Start a prophylactic Lovenox.  #2.  Type 2 diabetes. Start sliding scale insulin for now, check hemoglobin A1c.  3.  Essential hypertension.   Hold off blood pressure medicine for now.    Sharen Hones, MD 11/01/2020, 5:07 PM

## 2020-11-01 NOTE — Plan of Care (Signed)
Notified of patient being admitted for stroke work-up by Dr. Jacqualine Code.  Agree with stroke work-up as initiated by primary team.  Based on preliminary review will likely be a good candidate for dual antiplatelet therapy for a 90-day course as well as event monitor versus loop recorder placement.  Full consultation to follow tomorrow Lesleigh Noe MD-PhD [facility] [phone]  [facility] coverage for [facility] is from 8 AM to 4 AM in-house and 4 PM to 8 PM by telephone/video. 8 PM to 8 AM emergent questions or overnight urgent questions should be addressed to Teleneurology On-call or [facility] neurohospitalist; contact information can be found on AMION

## 2020-11-02 ENCOUNTER — Observation Stay
Admit: 2020-11-02 | Discharge: 2020-11-02 | Disposition: A | Discharge status: Home / Self Care | Payer: HMO | Attending: Internal Medicine | Admitting: Internal Medicine

## 2020-11-02 ENCOUNTER — Encounter: Payer: Self-pay | Admitting: Internal Medicine

## 2020-11-02 DIAGNOSIS — Z794 Long term (current) use of insulin: Secondary | ICD-10-CM | POA: Diagnosis not present

## 2020-11-02 DIAGNOSIS — I10 Essential (primary) hypertension: Secondary | ICD-10-CM | POA: Diagnosis not present

## 2020-11-02 DIAGNOSIS — E119 Type 2 diabetes mellitus without complications: Secondary | ICD-10-CM | POA: Diagnosis not present

## 2020-11-02 DIAGNOSIS — I6389 Other cerebral infarction: Secondary | ICD-10-CM | POA: Diagnosis not present

## 2020-11-02 LAB — ECHOCARDIOGRAM COMPLETE
AR max vel: 2.25 cm2
AV Area VTI: 2.36 cm2
AV Area mean vel: 2.09 cm2
AV Mean grad: 3 mmHg
AV Peak grad: 5.5 mmHg
Ao pk vel: 1.17 m/s
Area-P 1/2: 3.95 cm2
Height: 64 in
MV VTI: 2.19 cm2
S' Lateral: 3.1 cm
Weight: 2440.93 oz

## 2020-11-02 LAB — LIPID PANEL
Cholesterol: 164 mg/dL (ref 0–200)
HDL: 37 mg/dL — ABNORMAL LOW (ref >40)
LDL Cholesterol: 47 mg/dL (ref 0–99)
Total CHOL/HDL Ratio: 4.4 RATIO
Triglycerides: 398 mg/dL — ABNORMAL HIGH (ref <150)
VLDL: 80 mg/dL — ABNORMAL HIGH (ref 0–40)

## 2020-11-02 LAB — HEMOGLOBIN A1C
Hgb A1c MFr Bld: 6.8 % — ABNORMAL HIGH (ref 4.8–5.6)
Mean Plasma Glucose: 148.46 mg/dL

## 2020-11-02 LAB — GLUCOSE, CAPILLARY
Glucose-Capillary: 197 mg/dL — ABNORMAL HIGH (ref 70–99)
Glucose-Capillary: 153 mg/dL — ABNORMAL HIGH (ref 70–99)

## 2020-11-02 LAB — SARS CORONAVIRUS 2 (TAT 6-24 HRS): SARS Coronavirus 2: NEGATIVE

## 2020-11-02 MED ORDER — CLOPIDOGREL BISULFATE 75 MG PO TABS
75.0000 mg | ORAL_TABLET | Freq: Every day | ORAL | Status: DC
  Administered 2020-11-02: 11:00:00 75 mg via ORAL
  Filled 2020-11-02: qty 1

## 2020-11-02 MED ORDER — PANTOPRAZOLE SODIUM 40 MG PO TBEC
40.0000 mg | DELAYED_RELEASE_TABLET | Freq: Every day | ORAL | Status: DC
  Administered 2020-11-02: 11:00:00 40 mg via ORAL
  Filled 2020-11-02: qty 1

## 2020-11-02 MED ORDER — CLOPIDOGREL BISULFATE 75 MG PO TABS: 75.0000 mg | ORAL_TABLET | Freq: Every day | ORAL | 0 refills | Status: AC

## 2020-11-02 MED ORDER — CLOPIDOGREL BISULFATE 75 MG PO TABS: 75.0000 mg | ORAL_TABLET | Freq: Every day | ORAL | Status: DC

## 2020-11-02 MED ORDER — CLOPIDOGREL BISULFATE 75 MG PO TABS
225.0000 mg | ORAL_TABLET | Freq: Once | ORAL | Status: DC
  Filled 2020-11-02: qty 3

## 2020-11-02 MED ORDER — PERFLUTREN LIPID MICROSPHERE
1.0000 mL | INTRAVENOUS | Status: AC | PRN
  Administered 2020-11-02: 2 mL via INTRAVENOUS
  Filled 2020-11-02: qty 10

## 2020-11-02 MED ORDER — ASPIRIN EC 81 MG PO TBEC
81.0000 mg | DELAYED_RELEASE_TABLET | Freq: Every day | ORAL | Status: DC
  Administered 2020-11-02: 11:00:00 81 mg via ORAL
  Filled 2020-11-02: qty 1

## 2020-11-02 NOTE — TOC Transition Note (Signed)
Transition of Care [facility]) - CM/SW Discharge Note   Patient Details  Name: [name] MRN: [id number] Date of Birth: [birth year]  Transition of Care [facility]) CM/SW Contact:  Shelbie Hutching, RN Phone Number: 11/02/2020, 3:33 PM   Clinical Narrative:    Patient medically cleared for discharge home with [place] with [facility] accepted referral for [facility] PT.  Patient's husband will pick her up.    Final next level of care: [place] Barriers to Discharge: Barriers Resolved   Patient Goals and CMS Choice Patient states their goals for this hospitalization and ongoing recovery are:: Patient hopes to go home today CMS Medicare.gov Compare Post Acute Care list provided to:: Patient Choice offered to / list presented to : Patient  Discharge Placement                       Discharge Plan and Services   Discharge Planning Services: CM Consult Post Acute Care Choice: Home Health          DME Arranged: N/A DME Agency: NA       HH Arranged: PT [place] Agency: [place] ([place]) Date HH Agency Contacted: 11/02/20 Time [place]: [id number] Representative spoke with at [place]: [place] ([place]) Interventions     Readmission Risk Interventions No flowsheet data found.

## 2020-11-02 NOTE — Evaluation (Signed)
Occupational Therapy Evaluation Patient Details Name: [name] MRN: [id number] DOB: 10-Dec-1950 Today's Date: 11/02/2020    History of Present Illness Pt is a 70 y.o. F with significant medical hx including Chron's disease, DM, HTN, prior stroke with left-sided hemiparesis (2009), hypothyroidism arriving to ED by recommendations from neurologist for recent MRI findings of L temporal lobe stroke.   Clinical Impression   Pt. presents with residual LUE limitations, and visual limitations from a prior CVA 12 years ago. Pt. resides at home with her husband. Pt. Was independent with ADLs, and IADL functioning: including meal preparation, and medication management. Pt's husband assists with transportation. Pt. Uses a power w/c when shopping, and when out in the community. Pt. Presents with flexor tone, and tightness in her LUE with increased tightness in her left elbow. Pt. Was able to tolerate slow gentle prolonged passive stretching with increases in elbow extension. Pt. Does present with active left thumb motion. Pt. Reports being near baseline with her self-care skills. Pt. Reports that she would like to improve left elbow extension. Pt. Education was provided about PROM, stretching, and inhibitory techniques to help normalize tone prior to stretching. Pt. Could benefit from OT services for ADL training, A/E training, neuromuscular re-education,  there. Ex, and pt. education about home modification, and DME. Pt. Plans to return home upon discharge with family to assist pt. as needed. Pt. Could benefit from follow-up [place] services upon discharge.     Follow Up Recommendations  Home health OT    Equipment Recommendations  None recommended by OT    Recommendations for Other Services       Precautions / Restrictions Precautions Precautions: Fall Restrictions Weight Bearing Restrictions: No      Mobility Bed Mobility               General bed mobility comments: Pt. sitting up in chair  upon arrival.    Transfers Overall transfer level: Needs assistance   Transfers: Sit to/from Stand Sit to Stand: Supervision              Balance Overall balance assessment: Needs assistance   Sitting balance-Leahy Scale: Good       Standing balance-Leahy Scale: Fair                             ADL either performed or assessed with clinical judgement   ADL Overall ADL's : Needs assistance/impaired                                       General ADL Comments: Independent  with UE, and LE dressing using one armed techniques with the RUE.     Vision Baseline Vision/History: 1 Wears glasses Patient Visual Report: Peripheral vision impairment (Visual limitations superiorly, inferiorly, and to the left.)       Perception     Praxis      Pertinent Vitals/Pain Pain Assessment: No/denies pain     Hand Dominance Right   Extremity/Trunk Assessment Upper Extremity Assessment Upper Extremity Assessment: LUE deficits/detail;RUE deficits/detail RUE Deficits / Details: RUE WNL RUE Sensation: WNL RUE Coordination: WNL LUE Deficits / Details: Limited by flexor synergy tone throughout  the left LUE, and hand with increased tightness, at the elbow. Pt. does have consistent active volitional thumb movement           Communication Communication Communication: No  difficulties   Cognition     Overall Cognitive Status: Within Functional Limits for tasks assessed                                     General Comments       Exercises     Shoulder Instructions      Home Living Family/patient expects to be discharged to:: Private residence Living Arrangements: Spouse/significant other Available Help at Discharge: Family Type of Home: House Home Access: [place]: One level     Bathroom Shower/Tub: Tub/shower unit;Curtain         Home Equipment: Kasandra Knudsen - single point;Wheelchair - power           Prior Functioning/Environment          Comments: Pt. was independen with ADLs, IADLs at home, cookinf utilizes low vision strategies, manages medication, Husband assists with driving/transportation        OT Problem List: Decreased strength;Impaired UE functional use;Decreased activity tolerance;Decreased knowledge of use of DME or AE;Decreased coordination;Decreased range of motion      OT Treatment/Interventions: Self-care/ADL training;Therapeutic exercise;Patient/family education;Neuromuscular education;DME and/or AE instruction    OT Goals(Current goals can be found in the care plan section) Acute Rehab OT Goals Patient Stated Goal: To improve ROM in her left elbow OT Goal Formulation: With patient Time For Goal Achievement: 11/23/20 Potential to Achieve Goals: Fair  OT Frequency: Min 1X/week   Barriers to D/C:            Co-evaluation              AM-PAC OT "6 Clicks" Daily Activity     Outcome Measure Help from another person eating meals?: None Help from another person taking care of personal grooming?: None Help from another person toileting, which includes using toliet, bedpan, or urinal?: A Little Help from another person bathing (including washing, rinsing, drying)?: A Little Help from another person to put on and taking off regular upper body clothing?: None Help from another person to put on and taking off regular lower body clothing?: None 6 Click Score: 22   End of Session Equipment Utilized During Treatment: Gait belt  Activity Tolerance: Patient tolerated treatment well Patient left: in chair;with call bell/phone within reach  OT Visit Diagnosis: Low vision, both eyes (H54.2);Muscle weakness (generalized) (M62.81);Unsteadiness on feet (R26.81)                Time: 1020-1057 OT Time Calculation (min): 37 min Charges:  OT General Charges $OT Visit: 1 Visit OT Evaluation $OT Eval Moderate Complexity: 1 Mod  Harrel Carina, MS, OTR/L    Harrel Carina 11/02/2020, 1:01 PM

## 2020-11-02 NOTE — Care Management Obs Status (Signed)
[place] NOTIFICATION   Patient Details  Name: [name] MRN: [id number] Date of Birth: [birth year]   Medicare Observation Status Notification Given:  Yes    Shelbie Hutching, RN 11/02/2020, 10:16 AM

## 2020-11-02 NOTE — Progress Notes (Signed)
*  PRELIMINARY RESULTS* Echocardiogram 2D Echocardiogram has been performed.  [name] 11/02/2020, 3:27 PM

## 2020-11-02 NOTE — Progress Notes (Signed)
SLP Cancellation Note  Patient Details Name: [name] MRN: [id number] DOB: 09/29/1950   Cancelled treatment:       Reason Eval/Treat Not Completed: SLP screened, no needs identified, will sign off (chart reviewed; consulted NSG and met w/ pt). Pt denied any difficulty swallowing and is currently on a regular diet; tolerates swallowing pills w/ water per NSG. Pt conversed in conversation w/out new expressive/receptive deficits noted; speech rate is slightly slower but this is baseline for pt per her report. Pt denied any new speech-language deficits. Speech clear. Pt does have hemianopia.  No further skilled ST services indicated as pt appears at her baseline. Pt agreed. NSG to reconsult if any change in status while admitted.         , MS, CCC-SLP Speech Language Pathologist Rehab Services [phone] , 11/02/2020, 8:11 AM 

## 2020-11-02 NOTE — TOC Initial Note (Signed)
Transition of Care [facility]) - Initial/Assessment Note    Patient Details  Name: [name] MRN: [id number] Date of Birth: [birth year]  Transition of Care [facility]) CM/SW Contact:    Shelbie Hutching, RN Phone Number: 11/02/2020, 10:24 AM  Clinical Narrative:                 Patient placed under observation for acute stroke.  RNCM met with patient at the bedside, she has a history of stroke 12 years ago.  Patient is from home with her husband.  Patient is independent in ADL's and uses a strong arm cane.  She does not drive but her husband provides transportation.  Patient is current with PCP Dr. Lovie Macadamia.  Patient is also followed by Dr. Manuella Ghazi with neurology at the [facility].  Patient agrees to home health services.  Referral for home PT given to [facility] with [facility].   Patient's husband will pick her up at discharge.   Expected Discharge Plan: [place] Barriers to Discharge: Continued Medical Work up   Patient Goals and CMS Choice Patient states their goals for this hospitalization and ongoing recovery are:: Patient hopes to go home today CMS Medicare.gov Compare Post Acute Care list provided to:: Patient Choice offered to / list presented to : Patient  Expected Discharge Plan and Services Expected Discharge Plan: [place]   Discharge Planning Services: CM Consult Post Acute Care Choice: [place] arrangements for the past 2 months: Single Family Home                 DME Arranged: N/A DME Agency: NA       HH Arranged: PT HH Agency: [place] ([place]) Date HH Agency Contacted: 11/02/20 Time [place]: 1023 Representative spoke with at [place]: [name]  Prior Living Arrangements/Services Living arrangements for the past 2 months: [place] with:: Spouse Patient language and need for interpreter reviewed:: Yes Do you feel safe going back to the place where you live?: Yes      Need for Family  Participation in Patient Care: Yes (Comment) (stroke) Care giver support system in place?: Yes (comment) (husband) Current home services: DME (Strong arm cane- hand rails in bathroom, elevated toilet seats, walk in shower) Criminal Activity/Legal Involvement Pertinent to Current Situation/Hospitalization: No - Comment as needed  Activities of Daily Living Home Assistive Devices/Equipment: Cane (specify quad or straight), Walker (specify type) ADL Screening (condition at time of admission) Patient's cognitive ability adequate to safely complete daily activities?: Yes Is the patient deaf or have difficulty hearing?: No Does the patient have difficulty seeing, even when wearing glasses/contacts?: Yes Does the patient have difficulty concentrating, remembering, or making decisions?: No Patient able to express need for assistance with ADLs?: Yes Does the patient have difficulty dressing or bathing?: Yes Independently performs ADLs?: No Does the patient have difficulty walking or climbing stairs?: Yes Weakness of Legs: Both Weakness of Arms/Hands: Left  Permission Sought/Granted Permission sought to share information with : Case Manager, Family Supports, Other (comment) Permission granted to share information with : Yes, Verbal Permission Granted  Share Information with NAME: Marcello Moores  Permission granted to share info w AGENCY: Home Health agencies  Permission granted to share info w Relationship: husband     Emotional Assessment Appearance:: Appears stated age Attitude/Demeanor/Rapport: Engaged Affect (typically observed): Accepting Orientation: : Oriented to Self, Oriented to Place, Oriented to  Time, Oriented to Situation Alcohol / Substance Use: Not  Applicable Psych Involvement: No (comment)  Admission diagnosis:  CVA (cerebral vascular accident) ([place]) [I63.9] Acute stroke due to ischemia [facility]) [I63.9] Patient Active Problem List   Diagnosis Date Noted   CVA (cerebral vascular  accident) ([place]) 11/01/2020   Chronic pain 04/25/2020   Chronic constipation 04/25/2020   Gastro-esophageal reflux disease without esophagitis 04/25/2020   Hardening of the aorta (main artery of the heart) ([place]) 04/25/2020   History of iron deficiency anemia 04/25/2020   Hypercholesteremia 04/25/2020   Hypertension 04/25/2020   Personal history of transient ischemic attack (TIA), and cerebral infarction without residual deficits 04/25/2020   Pulmonary emphysema ([place]) 04/25/2020   Vitamin D deficiency 04/25/2020   Tremor, essential 04/24/2020   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   Right lower quadrant abdominal pain 08/18/2018   Spastic hemiplegia affecting nondominant side ([place]) 02/03/2014   Diabetes mellitus ([place]) 09/07/2013   Hypothyroidism 09/07/2013   Type 2 diabetes mellitus without complications ([place]) 87/56/4332   Hemiparesis and alteration of sensations as late effects of stroke ([place]) 01/26/2013   Abnormality of gait 01/26/2013   Dizziness and giddiness 01/26/2013   PCP:  Juluis Pitch, MD Pharmacy:   [facility] [place], [place] - [place] AT [place] [address] [place] [address] Phone: [phone] Fax: [phone]     Social Determinants of Health (SDOH) Interventions    Readmission Risk Interventions No flowsheet data found.

## 2020-11-02 NOTE — Plan of Care (Signed)
Patient orientedx4, VSS, NSR on telemetry. No c/o pain overnight. Q2hr neuro and VS maintained. Ambulatory to bathroom with special shoes, cane, and standby assistance. Adequate UO, no BM overnight. Fall/safety precautions in place, rounding performed.   Problem: Education: Goal: Knowledge of disease or condition will improve Outcome: Progressing Goal: Knowledge of secondary prevention will improve Outcome: Progressing Goal: Knowledge of patient specific risk factors addressed and post discharge goals established will improve Outcome: Progressing   Problem: Coping: Goal: Will verbalize positive feelings about self Outcome: Progressing   Problem: Self-Care: Goal: Ability to participate in self-care as condition permits will improve Outcome: Progressing   Problem: Ischemic Stroke/TIA Tissue Perfusion: Goal: Complications of ischemic stroke/TIA will be minimized Outcome: Progressing

## 2020-11-02 NOTE — Discharge Summary (Signed)
Physician Discharge Summary  Patient ID: [name] MRN: [id number] DOB/AGE: [birth year] 70 y.o.  Admit date: 11/01/2020 Discharge date: 11/02/2020  Admission Diagnoses:  Discharge Diagnoses:  Active Problems:   Hemiparesis and alteration of sensations as late effects of stroke (HCC)   Type 2 diabetes mellitus without complications (HCC)   Hypertension   CVA (cerebral vascular accident) [facility])   Discharged Condition: good  Hospital Course:   Traniece Boffa is a 70 year old female with multiple medical problems including Crohn's disease, depression, acid reflux, prior stroke with left-sided hemiparesis, essential hypertension, hypothyroidism, type 2 diabetes who was sent to the hospital by her neurologist for stroke. Patient had a first stroke in 2009, after that, she sustained severe left-sided hemiparesis.  She can only walk a few steps with a cane.  She went to her neurologist for routine visit, she was sent for MRI to check if she had a stroke between 2009 and then down.  The MRI came back with a left temporal lobe stroke. Other than mild headache, right hand tremor for 2 months, she does not have any other complaints to suspect a stroke. Patient is a seen by neurology after admission.  CT angiogram of the head and neck did not show significant occlusion.  Patient is treated with aspirin, Plavix.  #1.  Acute left temporal stroke. Patient stroke work-up showed LDL 47, VLDL 80, triglyceride 398, CT angiogram of the neck and head no clinical significant stenosis.  Discussed with neurology, continue aspirin and Plavix.  Patient be followed with neurology after discharge.  Patient will need referral for a ZIO monitor versus a loop recorder.  This can be set up with PCP as outpatient. Echocardiogram showed EF 50-55%, no significant valvular disease, no thrombus.    #2.  Type 2 diabetes. HbA1c still pending.  Need to follow-up with PCP as outpatient.  3.  Essential hypertension. Resume home  treatment  Consults: neurology  Significant Diagnostic Studies:  MRI HEAD WITHOUT CONTRAST   TECHNIQUE: Multiplanar, multiecho pulse sequences of the brain and surrounding structures were obtained without intravenous contrast.   COMPARISON:  Brain MRI 05/24/2007   FINDINGS: Brain: There is a small focus of diffusion restriction in the left superior temporal gyrus. There is a small focus of increased DWI signal in the right thalamus without convincing low signal on the ADC map likely reflecting T2 shine through.   There is a large remote PCA territorial infarct with associated encephalomalacia ex vacuo dilatation of the right lateral ventricle. There are small remote lacunar infarcts in the bilateral basal ganglia.   There is moderate parenchymal volume loss. Additional extensive FLAIR signal abnormality throughout the remainder of the subcortical and periventricular white matter likely reflects advanced chronic white matter microangiopathy. There is no evidence of acute intracranial hemorrhage or extra-axial fluid collection. There is no mass lesion. There is no midline shift.   Vascular: Normal flow voids.   Skull and upper cervical spine: Normal marrow signal.   Sinuses/Orbits: In the paranasal sinuses are clear. The globes and orbits are unremarkable.   Other: There is a right mastoid effusion.   IMPRESSION: 1. Small acute to subacute lacunar infarct in the left temporal lobe. 2. Remote right PCA territorial infarct and multiple additional small remote lacunar infarcts as above. 3. Moderate global parenchymal volume loss and advanced chronic white matter microangiopathy. 4. Right mastoid effusion.   These results will be called to the ordering clinician or representative by the Radiologist Assistant, and communication documented in the PACS  or [employer].     Electronically Signed   By: Valetta Mole M.D.   On: 11/01/2020 12:55     Result  History  CT ANGIOGRAPHY HEAD AND NECK   TECHNIQUE: Multidetector CT imaging of the head and neck was performed using the standard protocol during bolus administration of intravenous contrast. Multiplanar CT image reconstructions and MIPs were obtained to evaluate the vascular anatomy. Carotid stenosis measurements (when applicable) are obtained utilizing NASCET criteria, using the distal internal carotid diameter as the denominator.   CONTRAST:  22m OMNIPAQUE IOHEXOL 350 MG/ML SOLN   COMPARISON:  Brain MRI 11/01/2020. Brain MRI 05/24/2007. CT angiogram head/neck 05/24/2007.   FINDINGS: CT HEAD FINDINGS   Brain:   Mild generalized cerebral and cerebellar atrophy.   A possible small acute infarct within the left temporal lobe was better appreciated on the brain MRI performed earlier today 11/01/2020.   Redemonstrated large chronic cortical/subcortical infarct within the right PCA vascular territory, within the right parietal and occipital lobes, right temporal lobe and right thalamus.   Redemonstrated chronic small-vessel infarcts within the left frontal lobe periventricular white matter and bilateral deep gray nuclei.   There is no acute intracranial hemorrhage.   No demarcated cortical infarct.   No extra-axial fluid collection.   No evidence of an intracranial mass.   No midline shift.   Vascular: Reported below.   Skull: Normal. Negative for fracture or focal lesion.   Sinuses: No significant paranasal sinus disease.   Orbits: No mass or acute finding.   Other: Right mastoid effusion   Review of the MIP images confirms the above findings   CTA NECK FINDINGS   Aortic arch: The left vertebral artery arises directly from the aortic arch. Otherwise standard aortic branching. Atherosclerotic plaque within the visualized aortic arch and proximal major branch vessels of the neck. Streak artifact from a dense right-sided contrast bolus partially obscures the  right subclavian artery. Within this limitation, there is no appreciable hemodynamically significant stenosis within the innominate or proximal subclavian arteries.   Right carotid system: CCA and ICA patent within the neck without stenosis or significant atherosclerotic disease.   Left carotid system: CCA and ICA patent within the neck without stenosis. Minimal soft and calcified plaque within the carotid bifurcation.   Vertebral arteries: Vertebral arteries codominant and patent within the neck without stenosis.   Skeleton: Cervical spondylosis. No acute bony abnormality or aggressive osseous lesion. Partially imaged thoracic levocurvature.   Other neck: No neck mass or cervical lymphadenopathy.   Upper chest: No consolidation within the imaged lung apices.   Review of the MIP images confirms the above findings   CTA HEAD FINDINGS   Anterior circulation:   The intracranial internal carotid arteries are patent. Calcified plaque within both vessels with no more than mild stenosis. The M1 middle cerebral arteries are patent. Mild stenosis within the distal M1 left middle cerebral artery. Progressive atherosclerotic disease within the M2 and more distal middle cerebral artery vessels bilaterally. Most notably, there are progressive moderate/severe stenoses within a superior division mid M2 right MCA vessel (for instance as seen on series 14, image 19), and a severe stenosis within an inferior division proximal M2 left MCA vessel (series 14, image 34). The anterior cerebral arteries are patent without high-grade proximal stenosis. 1 mm inferiorly projecting vascular protrusions arising from the supraclinoid internal carotid arteries bilaterally, which may reflect infundibular or aneurysms.   Posterior circulation:   The intracranial vertebral arteries are patent. The basilar artery  is patent. There has been interval partial reconstitution of flow within the right posterior  cerebral artery, although this vessel remains diminutive and irregular. The left PCA is patent with diffuse atherosclerotic irregularity, but without proximal high-grade stenosis. Posterior communicating arteries are hypoplastic or absent bilaterally.   Venous sinuses: Within the limitations of contrast timing, no convincing thrombus.   Anatomic variants: As described   Review of the MIP images confirms the above findings   IMPRESSION: CT head:   1. A possible small acute infarct within the left temporal lobe was better appreciated on the brain MRI performed earlier today. 2. Redemonstrated large chronic right PCA territory infarct. 3. Redemonstrated chronic small-vessel infarcts within the left frontal lobe periventricular white matter and bilateral deep gray nuclei. 4. Background moderate chronic small-vessel ischemic changes within the cerebral white matter. 5. Mild generalized parenchymal atrophy. 6. Right mastoid effusion.   CTA neck:   The common carotid, internal carotid and vertebral arteries are patent within the neck without hemodynamically significant stenosis (50% or greater). Minimal soft and calcified plaque within the left carotid bifurcation.   CTA head:   1. No acute intracranial large vessel occlusion identified. 2. Since the prior CTA head/neck of 05/24/2007, there has been some interval reconstitution of flow within the right posterior cerebral artery, but this vessel remains diminutive and irregular. 3. Progressive intracranial atherosclerotic disease elsewhere, most notably, there are progressive moderate/severe stenoses within a superior division mid M2 right MCA vessel, and a new severe stenosis within a proximal M2 left MCA vessel. 4. 1 mm inferiorly projecting vascular protrusions arising from the supraclinoid internal carotid arteries bilaterally, which may reflect infundibula or small aneurysms.     Electronically Signed   By: Kellie Simmering  D.O.   On: 11/01/2020 18:53  Echo: Left ventricular ejection fraction, by estimation, is 50 to 55%. The left ventricle has low normal function. The left ventricle has no regional wall motion abnormalities. Left ventricular diastolic parameters are consistent with Grade I diastolic dysfunction (impaired relaxation). 1. 2. Right ventricular systolic function is normal. The right ventricular size is normal. 3. The mitral valve is normal in structure. Trivial mitral valve regurgitation. 4. The aortic valve is normal in structure. Aortic valve regurgitation is not visualized.   Treatments: ASA, plavix  Discharge Exam: Blood pressure 136/85, pulse 74, temperature 98 F (36.7 C), resp. rate 16, height 5' 4"  (1.626 m), weight 69.2 kg, SpO2 98 %. General appearance: alert and cooperative Resp: clear to auscultation bilaterally Cardio: regular rate and rhythm, S1, S2 normal, no murmur, click, rub or gallop GI: soft, non-tender; bowel sounds normal; no masses,  no organomegaly Extremities: extremities normal, atraumatic, no cyanosis or edema  Disposition: Discharge disposition: 01-Home or Self Care       Discharge Instructions     Diet - low sodium heart healthy   Complete by: As directed    Increase activity slowly   Complete by: As directed       Allergies as of 11/02/2020       Reactions   Codeine Other (See Comments)   Syncope.   Morphine And Related Nausea And Vomiting, Other (See Comments)   Hallucinations.   Baclofen Nausea Only, Other (See Comments)   dizziness   Metformin Hcl Other (See Comments)   Penicillins Other (See Comments)   Family history   Sulfamethoxazole Itching   Hands itching    Elemental Sulfur Itching   Per patient happened years ago.  Medication List     TAKE these medications    Accu-Chek Guide test strip Generic drug: glucose blood Check blood sugar   aspirin 81 MG tablet Take 81 mg by mouth daily.   clopidogrel 75 MG  tablet Commonly known as: PLAVIX Take 1 tablet (75 mg total) by mouth daily. Start taking on: November 03, 2020   colestipol 1 g tablet Commonly known as: COLESTID   diazepam 2 MG tablet Commonly known as: VALIUM Take 1 tablet (2 mg total) by mouth at bedtime.   ketoconazole 2 % cream Commonly known as: NIZORAL Apply 1 application topically daily.   levothyroxine 137 MCG tablet Commonly known as: SYNTHROID Take 137 mcg by mouth every morning. What changed: Another medication with the same name was removed. Continue taking this medication, and follow the directions you see here.   Multivitamin & Mineral Liqd   mupirocin ointment 2 % Commonly known as: BACTROBAN Apply to healing wound QD until healed.   pantoprazole 40 MG tablet Commonly known as: PROTONIX Take 40 mg by mouth daily.   Pen Needles 32G X 4 MM Misc BD Nano 2nd Gen Pen Needle 32 gauge x 5/32"  USE DAILY WITH INSULIN   ramipril 2.5 MG capsule Commonly known as: ALTACE Take 2.5 mg by mouth daily.   sennosides-docusate sodium 8.6-50 MG tablet Commonly known as: SENOKOT-S   simvastatin 20 MG tablet Commonly known as: ZOCOR SMARTSIG:1 Tablet(s) By Mouth Every Evening   Tresiba FlexTouch 100 UNIT/ML FlexTouch Pen Generic drug: insulin degludec Inject 36 Units into the skin daily.   Vitamin B12 1000 MCG Tbcr        Follow-up Information     Juluis Pitch, MD Follow up in 1 week(s).   Specialty: Family Medicine Contact information: [address] [address] [phone]         Vladimir Crofts, MD Follow up in 1 week(s).   Specialty: Neurology Contact information: [facility] [place] [address] [phone]                 Signed: Sharen Hones 11/02/2020, 2:38 PM

## 2020-11-02 NOTE — Evaluation (Signed)
Physical Therapy Evaluation Patient Details Name: [name] MRN: [id number] DOB: 08/13/1950 Today's Date: 11/02/2020   History of Present Illness  Pt is a 70 y.o. F with significant medical hx including Chron's disease, DM, HTN, prior stroke with left-sided hemiparesis (2009), hypothyroidism arriving to ED by recommendations from neurologist for recent MRI findings of L temporal lobe stroke.  Clinical Impression  Pt alert, oriented x 4, motivated for therapy and walk. Pt states PLOF is mod-I for ambulation utilizing a SPC or rollator for household mobility and indep for ADLs including cooking, bathing. Pt no longer drives but spouse provides assitance for running errands. Pt demonstrates L hemiparesis resulting from chronic stroke w/ limited usage of LUE along w/ chronic L sided neglect.   Pt tranfers w/ a RW w/ RUE requiring supervision demonstrating good control and technique. Ambulated 200 ft w/ RW for part of distance and IV pole for remainder w/ min-guard for safety. Verbal and tactile cues are necessary when walking in a new environment 2/2 to decreased vision and prior L sided neglect. No LOB or instabilty noted during treatment. Functional mobility appears to be a pt's baseline and HHPT is recommended to further improve strength, balance, and maintain independence w/ ADLs. Skilled PT intervention is indicated to address deficits in function, mobility, and to return to PLOF as able.      Follow Up Recommendations Home health PT    Equipment Recommendations  None recommended by PT    Recommendations for Other Services       Precautions / Restrictions Precautions Precautions: Fall Restrictions Weight Bearing Restrictions: No      Mobility  Bed Mobility               General bed mobility comments: Pt seated in recliner beginning of session    Transfers Overall transfer level: Needs assistance Equipment used: Rolling walker (2 wheeled) Transfers: Sit to/from Stand Sit  to Stand: Supervision         General transfer comment: Supervision for environment set-up 2/2 to low vision particuarly L  Ambulation/Gait Ambulation/Gait assistance: Min guard Gait Distance (Feet): 200 Feet Assistive device: Rolling walker (2 wheeled);IV Pole Gait Pattern/deviations: Trunk flexed;Decreased step length - left     General Gait Details: Amb w/ AFO on LLE & RW w/ RUE, min-gaurd for navigational cues due to L sided neglect & decrease vision for R eye;  Stairs            Wheelchair Mobility    Modified Rankin (Stroke Patients Only)       Balance Overall balance assessment: Needs assistance Sitting-balance support: Feet supported;No upper extremity supported Sitting balance-Leahy Scale: Good     Standing balance support: Single extremity supported;During functional activity Standing balance-Leahy Scale: Fair Standing balance comment: requires RUE support                             Pertinent Vitals/Pain Pain Assessment: No/denies pain    Home Living Family/patient expects to be discharged to:: Private residence Living Arrangements: Spouse/significant other Available Help at Discharge: Family Type of Home: House Home Access: Ramped entrance     Home Layout: One level Home Equipment: Cane - single point;Walker - 4 wheels;Shower seat - built in;Grab bars - tub/shower      Prior Function Level of Independence: Independent with assistive device(s)         Comments: Pt is independent w/ ADLs and ambulates w/ either a SPC or  rollator for household distances. Pt no longer drives 2/2 to decreased vision.     Hand Dominance   Dominant Hand: Right    Extremity/Trunk Assessment   Upper Extremity Assessment Upper Extremity Assessment: RUE deficits/detail;LUE deficits/detail RUE Deficits / Details: Able to abd shoulder > 90 degrees RUE Sensation: WNL RUE Coordination: WNL LUE Deficits / Details: Increased flexor tone, decreased  sensation throughout LUE LUE Sensation: decreased light touch    Lower Extremity Assessment Lower Extremity Assessment: RLE deficits/detail;LLE deficits/detail RLE Deficits / Details: 4/5 MMT hip flexion, 5/5 knee ext, 4/5 DF RLE Sensation: WNL LLE Deficits / Details: 3/5 hip, knee, and ankle dorsiflexion LLE Sensation: decreased light touch       Communication   Communication: No difficulties  Cognition Arousal/Alertness: Awake/alert Behavior During Therapy: WFL for tasks assessed/performed Overall Cognitive Status: Within Functional Limits for tasks assessed                                 General Comments: Pleasant, conversant, AOx4      General Comments      Exercises Other Exercises Other Exercises: Recliner > bathroom w/ min-guard, RW, cues for enviornmental navigation. Pt able to perform pericare independently   Assessment/Plan    PT Assessment Patient needs continued PT services  PT Problem List Decreased strength;Decreased activity tolerance;Decreased range of motion;Decreased balance;Decreased mobility;Decreased coordination;Impaired sensation       PT Treatment Interventions Balance training;Gait training;Neuromuscular re-education;Stair training;Functional mobility training;Therapeutic activities;Therapeutic exercise    PT Goals (Current goals can be found in the Care Plan section)  Acute Rehab PT Goals Patient Stated Goal: To go home PT Goal Formulation: With patient Time For Goal Achievement: 11/16/20 Potential to Achieve Goals: Good    Frequency Min 2X/week   Barriers to discharge        Co-evaluation               AM-PAC PT "6 Clicks" Mobility  Outcome Measure Help needed turning from your back to your side while in a flat bed without using bedrails?: None Help needed moving from lying on your back to sitting on the side of a flat bed without using bedrails?: None Help needed moving to and from a bed to a chair (including a  wheelchair)?: A Little Help needed standing up from a chair using your arms (e.g., wheelchair or bedside chair)?: A Little Help needed to walk in hospital room?: A Little Help needed climbing 3-5 steps with a railing? : A Little 6 Click Score: 20    End of Session Equipment Utilized During Treatment: Gait belt Activity Tolerance: Patient tolerated treatment well Patient left: in chair;with call bell/phone within reach;with chair alarm set Nurse Communication: Mobility status PT Visit Diagnosis: Other abnormalities of gait and mobility (R26.89);Hemiplegia and hemiparesis;Muscle weakness (generalized) (M62.81) Hemiplegia - Right/Left: Left Hemiplegia - dominant/non-dominant: Non-dominant    Time: 1601-0932 PT Time Calculation (min) (ACUTE ONLY): 47 min   Charges:              [employer], SPT

## 2020-11-05 DIAGNOSIS — Z9181 History of falling: Secondary | ICD-10-CM | POA: Diagnosis not present

## 2020-11-05 DIAGNOSIS — Z7982 Long term (current) use of aspirin: Secondary | ICD-10-CM | POA: Diagnosis not present

## 2020-11-05 DIAGNOSIS — Z7902 Long term (current) use of antithrombotics/antiplatelets: Secondary | ICD-10-CM | POA: Diagnosis not present

## 2020-11-05 DIAGNOSIS — K5900 Constipation, unspecified: Secondary | ICD-10-CM | POA: Diagnosis not present

## 2020-11-05 DIAGNOSIS — E119 Type 2 diabetes mellitus without complications: Secondary | ICD-10-CM | POA: Diagnosis not present

## 2020-11-05 DIAGNOSIS — K219 Gastro-esophageal reflux disease without esophagitis: Secondary | ICD-10-CM | POA: Diagnosis not present

## 2020-11-05 DIAGNOSIS — Z794 Long term (current) use of insulin: Secondary | ICD-10-CM | POA: Diagnosis not present

## 2020-11-05 DIAGNOSIS — E785 Hyperlipidemia, unspecified: Secondary | ICD-10-CM | POA: Diagnosis not present

## 2020-11-05 DIAGNOSIS — Z87891 Personal history of nicotine dependence: Secondary | ICD-10-CM | POA: Diagnosis not present

## 2020-11-05 DIAGNOSIS — F32A Depression, unspecified: Secondary | ICD-10-CM | POA: Diagnosis not present

## 2020-11-05 DIAGNOSIS — I69354 Hemiplegia and hemiparesis following cerebral infarction affecting left non-dominant side: Secondary | ICD-10-CM | POA: Diagnosis not present

## 2020-11-05 DIAGNOSIS — K5090 Crohn's disease, unspecified, without complications: Secondary | ICD-10-CM | POA: Diagnosis not present

## 2020-11-05 DIAGNOSIS — F410 Panic disorder [episodic paroxysmal anxiety] without agoraphobia: Secondary | ICD-10-CM | POA: Diagnosis not present

## 2020-11-05 DIAGNOSIS — E039 Hypothyroidism, unspecified: Secondary | ICD-10-CM | POA: Diagnosis not present

## 2020-11-05 DIAGNOSIS — Z79899 Other long term (current) drug therapy: Secondary | ICD-10-CM | POA: Diagnosis not present

## 2020-11-05 DIAGNOSIS — R519 Headache, unspecified: Secondary | ICD-10-CM | POA: Diagnosis not present

## 2020-11-05 DIAGNOSIS — I10 Essential (primary) hypertension: Secondary | ICD-10-CM | POA: Diagnosis not present

## 2020-11-05 DIAGNOSIS — H5347 Heteronymous bilateral field defects: Secondary | ICD-10-CM | POA: Diagnosis not present

## 2020-11-05 DIAGNOSIS — G250 Essential tremor: Secondary | ICD-10-CM | POA: Diagnosis not present

## 2020-11-07 DIAGNOSIS — E1159 Type 2 diabetes mellitus with other circulatory complications: Secondary | ICD-10-CM | POA: Diagnosis not present

## 2020-11-07 DIAGNOSIS — I152 Hypertension secondary to endocrine disorders: Secondary | ICD-10-CM | POA: Diagnosis not present

## 2020-11-07 DIAGNOSIS — Z794 Long term (current) use of insulin: Secondary | ICD-10-CM | POA: Diagnosis not present

## 2020-11-07 DIAGNOSIS — E559 Vitamin D deficiency, unspecified: Secondary | ICD-10-CM | POA: Diagnosis not present

## 2020-11-07 DIAGNOSIS — E785 Hyperlipidemia, unspecified: Secondary | ICD-10-CM | POA: Diagnosis not present

## 2020-11-07 DIAGNOSIS — E1142 Type 2 diabetes mellitus with diabetic polyneuropathy: Secondary | ICD-10-CM | POA: Diagnosis not present

## 2020-11-07 DIAGNOSIS — E1169 Type 2 diabetes mellitus with other specified complication: Secondary | ICD-10-CM | POA: Diagnosis not present

## 2020-11-08 ENCOUNTER — Telehealth: Payer: Self-pay | Admitting: Podiatry

## 2020-11-08 NOTE — Telephone Encounter (Signed)
Received updated HTA auth # [id number] for diabetic shoes/inserts([id number] [phone] [phone]) valid 9.9.2022 thru 12.8.2022

## 2020-11-09 DIAGNOSIS — Z8673 Personal history of transient ischemic attack (TIA), and cerebral infarction without residual deficits: Secondary | ICD-10-CM | POA: Diagnosis not present

## 2020-11-09 DIAGNOSIS — E1142 Type 2 diabetes mellitus with diabetic polyneuropathy: Secondary | ICD-10-CM | POA: Diagnosis not present

## 2020-11-09 DIAGNOSIS — I69359 Hemiplegia and hemiparesis following cerebral infarction affecting unspecified side: Secondary | ICD-10-CM | POA: Diagnosis not present

## 2020-11-09 DIAGNOSIS — Z794 Long term (current) use of insulin: Secondary | ICD-10-CM | POA: Diagnosis not present

## 2020-11-12 DIAGNOSIS — E785 Hyperlipidemia, unspecified: Secondary | ICD-10-CM | POA: Diagnosis not present

## 2020-11-12 DIAGNOSIS — K5900 Constipation, unspecified: Secondary | ICD-10-CM | POA: Diagnosis not present

## 2020-11-12 DIAGNOSIS — K5090 Crohn's disease, unspecified, without complications: Secondary | ICD-10-CM | POA: Diagnosis not present

## 2020-11-12 DIAGNOSIS — I69354 Hemiplegia and hemiparesis following cerebral infarction affecting left non-dominant side: Secondary | ICD-10-CM | POA: Diagnosis not present

## 2020-11-12 DIAGNOSIS — K219 Gastro-esophageal reflux disease without esophagitis: Secondary | ICD-10-CM | POA: Diagnosis not present

## 2020-11-12 DIAGNOSIS — E119 Type 2 diabetes mellitus without complications: Secondary | ICD-10-CM | POA: Diagnosis not present

## 2020-11-12 DIAGNOSIS — I10 Essential (primary) hypertension: Secondary | ICD-10-CM | POA: Diagnosis not present

## 2020-11-19 NOTE — Progress Notes (Signed)
Patient presents to the office today for diabetic shoe and insole measuring.  Patient was measured with brannock device to determine size and width for 1 pair of extra depth shoes and foam casted for 3 pair of insoles.   Documentation of medical necessity will be sent to patient's treating diabetic doctor to verify and sign.   Patient's diabetic provider:   Shoes and insoles will be ordered at that time and patient will be notified for an appointment for fitting when they arrive.   Shoe size (per patient):    Brannock measurement:   Patient shoe selection-   1st choice:     2nd choice:    Shoe size ordered:

## 2020-11-21 DIAGNOSIS — I639 Cerebral infarction, unspecified: Secondary | ICD-10-CM | POA: Diagnosis not present

## 2020-11-21 DIAGNOSIS — I480 Paroxysmal atrial fibrillation: Secondary | ICD-10-CM | POA: Diagnosis not present

## 2020-11-22 ENCOUNTER — Encounter: Payer: Self-pay | Admitting: Podiatry

## 2020-11-22 ENCOUNTER — Ambulatory Visit: Payer: HMO | Admitting: Podiatry

## 2020-11-22 ENCOUNTER — Other Ambulatory Visit: Payer: Self-pay

## 2020-11-22 DIAGNOSIS — M21372 Foot drop, left foot: Secondary | ICD-10-CM

## 2020-11-22 DIAGNOSIS — M216X1 Other acquired deformities of right foot: Secondary | ICD-10-CM | POA: Diagnosis not present

## 2020-11-22 DIAGNOSIS — M216X2 Other acquired deformities of left foot: Secondary | ICD-10-CM | POA: Diagnosis not present

## 2020-11-22 DIAGNOSIS — E1142 Type 2 diabetes mellitus with diabetic polyneuropathy: Secondary | ICD-10-CM

## 2020-11-22 DIAGNOSIS — B351 Tinea unguium: Secondary | ICD-10-CM

## 2020-11-22 DIAGNOSIS — Z794 Long term (current) use of insulin: Secondary | ICD-10-CM

## 2020-11-22 DIAGNOSIS — M79675 Pain in left toe(s): Secondary | ICD-10-CM

## 2020-11-22 DIAGNOSIS — M79674 Pain in right toe(s): Secondary | ICD-10-CM

## 2020-11-22 NOTE — Progress Notes (Signed)
This patient returns to my office for at risk foot care.  This patient requires this care by a professional since this patient will be at risk due to having diabetes mellitus and dropfoot left foot., CVA and coagulation defect.  Patient is taking plavix.  This patient is unable to cut nails herself since the patient cannot reach her nails.These nails are painful walking and wearing shoes. She presents to the office today in a wheelchair. This patient presents for at risk foot care today.  General Appearance  Alert, conversant and in no acute stress.  Vascular  Dorsalis pedis and posterior tibial  pulses are palpable  bilaterally.  Capillary return is within normal limits  bilaterally. Temperature is within normal limits  Bilaterally.  Swelling left leg and foot.  Neurologic  Senn-Weinstein monofilament wire test diminished   bilaterally. Muscle power within normal limits bilaterally.  Nails Thick disfigured discolored nails with subungual debris  from hallux to fifth toes bilaterally. No evidence of bacterial infection or drainage bilaterally.  Orthopedic  No crepitus or effusions noted.  No bony pathology or digital deformities noted.  Normal motion and strength right foot.  Limited ROM and muscle strength left foot.  Skin  normotropic skin with no porokeratosis noted bilaterally.  No signs of infections or ulcers noted.     Onychomycosis  Pain in right toes  Pain in left toes  Consent was obtained for treatment procedures.   Mechanical debridement of nails 1-5  bilaterally performed with a nail nipper.  Filed with dremel without incident.  Patient presents today and was dispensed 0ne pair ( two units) of medically necessary extra depth shoes with three pair( six units) of custom molded multiple density inserts. The shoes and the inserts are fitted to the patients ' feet and are noted to fit well and are free of defect.  Length and width of the shoes are also acceptable.  Patient was given written  and verbal  instructions for wearing.  If any concerns arrive with the shoes or inserts, the patient is to call the office.Patient is to follow up as needed.   Return office visit   8 weeks                  Told patient to return for periodic foot care and evaluation due to potential at risk complications.   Gardiner Barefoot DPM

## 2020-11-23 ENCOUNTER — Telehealth: Payer: Self-pay | Admitting: Podiatry

## 2020-11-23 NOTE — Telephone Encounter (Signed)
Pt left message for ej to call her about her diabetic shoes and inserts she received.  I returned call and pt is not sure why the inserts were ordered especially for the left foot as she wears a brace.  I explained they are diabetic inserts and go with the shoes she received.She states the shoes appear too tight with the inserts and cannot get her foot in the shoe with the brace especially with the brace. I scheduled her to see EJ in [place] office for 10.12 and asked Jocelyn Lamer to take the charges out for the left insoles.

## 2020-11-26 DIAGNOSIS — E119 Type 2 diabetes mellitus without complications: Secondary | ICD-10-CM | POA: Diagnosis not present

## 2020-11-26 DIAGNOSIS — Z794 Long term (current) use of insulin: Secondary | ICD-10-CM | POA: Diagnosis not present

## 2020-11-26 DIAGNOSIS — I639 Cerebral infarction, unspecified: Secondary | ICD-10-CM | POA: Diagnosis not present

## 2020-11-26 DIAGNOSIS — E7800 Pure hypercholesterolemia, unspecified: Secondary | ICD-10-CM | POA: Diagnosis not present

## 2020-11-26 DIAGNOSIS — E039 Hypothyroidism, unspecified: Secondary | ICD-10-CM | POA: Diagnosis not present

## 2020-11-28 DIAGNOSIS — E785 Hyperlipidemia, unspecified: Secondary | ICD-10-CM | POA: Diagnosis not present

## 2020-11-28 DIAGNOSIS — R519 Headache, unspecified: Secondary | ICD-10-CM | POA: Diagnosis not present

## 2020-11-28 DIAGNOSIS — H5347 Heteronymous bilateral field defects: Secondary | ICD-10-CM | POA: Diagnosis not present

## 2020-11-28 DIAGNOSIS — Z7982 Long term (current) use of aspirin: Secondary | ICD-10-CM | POA: Diagnosis not present

## 2020-11-28 DIAGNOSIS — K5090 Crohn's disease, unspecified, without complications: Secondary | ICD-10-CM | POA: Diagnosis not present

## 2020-11-28 DIAGNOSIS — G250 Essential tremor: Secondary | ICD-10-CM | POA: Diagnosis not present

## 2020-11-28 DIAGNOSIS — E039 Hypothyroidism, unspecified: Secondary | ICD-10-CM | POA: Diagnosis not present

## 2020-11-28 DIAGNOSIS — Z79899 Other long term (current) drug therapy: Secondary | ICD-10-CM | POA: Diagnosis not present

## 2020-11-28 DIAGNOSIS — I69354 Hemiplegia and hemiparesis following cerebral infarction affecting left non-dominant side: Secondary | ICD-10-CM | POA: Diagnosis not present

## 2020-11-28 DIAGNOSIS — Z9181 History of falling: Secondary | ICD-10-CM | POA: Diagnosis not present

## 2020-11-28 DIAGNOSIS — I10 Essential (primary) hypertension: Secondary | ICD-10-CM | POA: Diagnosis not present

## 2020-11-28 DIAGNOSIS — E119 Type 2 diabetes mellitus without complications: Secondary | ICD-10-CM | POA: Diagnosis not present

## 2020-11-28 DIAGNOSIS — Z87891 Personal history of nicotine dependence: Secondary | ICD-10-CM | POA: Diagnosis not present

## 2020-11-28 DIAGNOSIS — Z794 Long term (current) use of insulin: Secondary | ICD-10-CM | POA: Diagnosis not present

## 2020-11-28 DIAGNOSIS — F32A Depression, unspecified: Secondary | ICD-10-CM | POA: Diagnosis not present

## 2020-11-28 DIAGNOSIS — K5900 Constipation, unspecified: Secondary | ICD-10-CM | POA: Diagnosis not present

## 2020-11-28 DIAGNOSIS — F410 Panic disorder [episodic paroxysmal anxiety] without agoraphobia: Secondary | ICD-10-CM | POA: Diagnosis not present

## 2020-11-28 DIAGNOSIS — Z7902 Long term (current) use of antithrombotics/antiplatelets: Secondary | ICD-10-CM | POA: Diagnosis not present

## 2020-11-28 DIAGNOSIS — K219 Gastro-esophageal reflux disease without esophagitis: Secondary | ICD-10-CM | POA: Diagnosis not present

## 2020-11-30 DIAGNOSIS — Z7902 Long term (current) use of antithrombotics/antiplatelets: Secondary | ICD-10-CM | POA: Diagnosis not present

## 2020-11-30 DIAGNOSIS — R519 Headache, unspecified: Secondary | ICD-10-CM | POA: Diagnosis not present

## 2020-11-30 DIAGNOSIS — G250 Essential tremor: Secondary | ICD-10-CM | POA: Diagnosis not present

## 2020-11-30 DIAGNOSIS — H5347 Heteronymous bilateral field defects: Secondary | ICD-10-CM | POA: Diagnosis not present

## 2020-11-30 DIAGNOSIS — K219 Gastro-esophageal reflux disease without esophagitis: Secondary | ICD-10-CM | POA: Diagnosis not present

## 2020-11-30 DIAGNOSIS — K5900 Constipation, unspecified: Secondary | ICD-10-CM | POA: Diagnosis not present

## 2020-11-30 DIAGNOSIS — Z79899 Other long term (current) drug therapy: Secondary | ICD-10-CM | POA: Diagnosis not present

## 2020-11-30 DIAGNOSIS — K5090 Crohn's disease, unspecified, without complications: Secondary | ICD-10-CM | POA: Diagnosis not present

## 2020-11-30 DIAGNOSIS — E785 Hyperlipidemia, unspecified: Secondary | ICD-10-CM | POA: Diagnosis not present

## 2020-11-30 DIAGNOSIS — Z87891 Personal history of nicotine dependence: Secondary | ICD-10-CM | POA: Diagnosis not present

## 2020-11-30 DIAGNOSIS — F32A Depression, unspecified: Secondary | ICD-10-CM | POA: Diagnosis not present

## 2020-11-30 DIAGNOSIS — I69354 Hemiplegia and hemiparesis following cerebral infarction affecting left non-dominant side: Secondary | ICD-10-CM | POA: Diagnosis not present

## 2020-11-30 DIAGNOSIS — Z794 Long term (current) use of insulin: Secondary | ICD-10-CM | POA: Diagnosis not present

## 2020-11-30 DIAGNOSIS — Z9181 History of falling: Secondary | ICD-10-CM | POA: Diagnosis not present

## 2020-11-30 DIAGNOSIS — I10 Essential (primary) hypertension: Secondary | ICD-10-CM | POA: Diagnosis not present

## 2020-11-30 DIAGNOSIS — F410 Panic disorder [episodic paroxysmal anxiety] without agoraphobia: Secondary | ICD-10-CM | POA: Diagnosis not present

## 2020-11-30 DIAGNOSIS — Z7982 Long term (current) use of aspirin: Secondary | ICD-10-CM | POA: Diagnosis not present

## 2020-11-30 DIAGNOSIS — E119 Type 2 diabetes mellitus without complications: Secondary | ICD-10-CM | POA: Diagnosis not present

## 2020-11-30 DIAGNOSIS — E039 Hypothyroidism, unspecified: Secondary | ICD-10-CM | POA: Diagnosis not present

## 2020-12-05 ENCOUNTER — Other Ambulatory Visit: Payer: Self-pay

## 2020-12-05 ENCOUNTER — Other Ambulatory Visit: Payer: HMO

## 2020-12-06 DIAGNOSIS — Z87891 Personal history of nicotine dependence: Secondary | ICD-10-CM | POA: Diagnosis not present

## 2020-12-06 DIAGNOSIS — Z794 Long term (current) use of insulin: Secondary | ICD-10-CM | POA: Diagnosis not present

## 2020-12-06 DIAGNOSIS — H5347 Heteronymous bilateral field defects: Secondary | ICD-10-CM | POA: Diagnosis not present

## 2020-12-06 DIAGNOSIS — R519 Headache, unspecified: Secondary | ICD-10-CM | POA: Diagnosis not present

## 2020-12-06 DIAGNOSIS — Z9181 History of falling: Secondary | ICD-10-CM | POA: Diagnosis not present

## 2020-12-06 DIAGNOSIS — E039 Hypothyroidism, unspecified: Secondary | ICD-10-CM | POA: Diagnosis not present

## 2020-12-06 DIAGNOSIS — K5090 Crohn's disease, unspecified, without complications: Secondary | ICD-10-CM | POA: Diagnosis not present

## 2020-12-06 DIAGNOSIS — I10 Essential (primary) hypertension: Secondary | ICD-10-CM | POA: Diagnosis not present

## 2020-12-06 DIAGNOSIS — K219 Gastro-esophageal reflux disease without esophagitis: Secondary | ICD-10-CM | POA: Diagnosis not present

## 2020-12-06 DIAGNOSIS — G250 Essential tremor: Secondary | ICD-10-CM | POA: Diagnosis not present

## 2020-12-06 DIAGNOSIS — Z7902 Long term (current) use of antithrombotics/antiplatelets: Secondary | ICD-10-CM | POA: Diagnosis not present

## 2020-12-06 DIAGNOSIS — I69354 Hemiplegia and hemiparesis following cerebral infarction affecting left non-dominant side: Secondary | ICD-10-CM | POA: Diagnosis not present

## 2020-12-06 DIAGNOSIS — K5900 Constipation, unspecified: Secondary | ICD-10-CM | POA: Diagnosis not present

## 2020-12-06 DIAGNOSIS — Z79899 Other long term (current) drug therapy: Secondary | ICD-10-CM | POA: Diagnosis not present

## 2020-12-06 DIAGNOSIS — F410 Panic disorder [episodic paroxysmal anxiety] without agoraphobia: Secondary | ICD-10-CM | POA: Diagnosis not present

## 2020-12-06 DIAGNOSIS — Z7982 Long term (current) use of aspirin: Secondary | ICD-10-CM | POA: Diagnosis not present

## 2020-12-06 DIAGNOSIS — E119 Type 2 diabetes mellitus without complications: Secondary | ICD-10-CM | POA: Diagnosis not present

## 2020-12-06 DIAGNOSIS — E785 Hyperlipidemia, unspecified: Secondary | ICD-10-CM | POA: Diagnosis not present

## 2020-12-06 DIAGNOSIS — F32A Depression, unspecified: Secondary | ICD-10-CM | POA: Diagnosis not present

## 2020-12-13 DIAGNOSIS — K219 Gastro-esophageal reflux disease without esophagitis: Secondary | ICD-10-CM | POA: Diagnosis not present

## 2020-12-13 DIAGNOSIS — F410 Panic disorder [episodic paroxysmal anxiety] without agoraphobia: Secondary | ICD-10-CM | POA: Diagnosis not present

## 2020-12-13 DIAGNOSIS — Z79899 Other long term (current) drug therapy: Secondary | ICD-10-CM | POA: Diagnosis not present

## 2020-12-13 DIAGNOSIS — G250 Essential tremor: Secondary | ICD-10-CM | POA: Diagnosis not present

## 2020-12-13 DIAGNOSIS — E119 Type 2 diabetes mellitus without complications: Secondary | ICD-10-CM | POA: Diagnosis not present

## 2020-12-13 DIAGNOSIS — E039 Hypothyroidism, unspecified: Secondary | ICD-10-CM | POA: Diagnosis not present

## 2020-12-13 DIAGNOSIS — I69354 Hemiplegia and hemiparesis following cerebral infarction affecting left non-dominant side: Secondary | ICD-10-CM | POA: Diagnosis not present

## 2020-12-13 DIAGNOSIS — Z9181 History of falling: Secondary | ICD-10-CM | POA: Diagnosis not present

## 2020-12-13 DIAGNOSIS — Z794 Long term (current) use of insulin: Secondary | ICD-10-CM | POA: Diagnosis not present

## 2020-12-13 DIAGNOSIS — F32A Depression, unspecified: Secondary | ICD-10-CM | POA: Diagnosis not present

## 2020-12-13 DIAGNOSIS — E785 Hyperlipidemia, unspecified: Secondary | ICD-10-CM | POA: Diagnosis not present

## 2020-12-13 DIAGNOSIS — K5900 Constipation, unspecified: Secondary | ICD-10-CM | POA: Diagnosis not present

## 2020-12-13 DIAGNOSIS — R519 Headache, unspecified: Secondary | ICD-10-CM | POA: Diagnosis not present

## 2020-12-13 DIAGNOSIS — Z7902 Long term (current) use of antithrombotics/antiplatelets: Secondary | ICD-10-CM | POA: Diagnosis not present

## 2020-12-13 DIAGNOSIS — Z7982 Long term (current) use of aspirin: Secondary | ICD-10-CM | POA: Diagnosis not present

## 2020-12-13 DIAGNOSIS — I10 Essential (primary) hypertension: Secondary | ICD-10-CM | POA: Diagnosis not present

## 2020-12-13 DIAGNOSIS — H5347 Heteronymous bilateral field defects: Secondary | ICD-10-CM | POA: Diagnosis not present

## 2020-12-13 DIAGNOSIS — Z87891 Personal history of nicotine dependence: Secondary | ICD-10-CM | POA: Diagnosis not present

## 2020-12-13 DIAGNOSIS — K5090 Crohn's disease, unspecified, without complications: Secondary | ICD-10-CM | POA: Diagnosis not present

## 2020-12-18 DIAGNOSIS — R202 Paresthesia of skin: Secondary | ICD-10-CM | POA: Diagnosis not present

## 2020-12-18 DIAGNOSIS — R269 Unspecified abnormalities of gait and mobility: Secondary | ICD-10-CM | POA: Diagnosis not present

## 2020-12-18 DIAGNOSIS — R200 Anesthesia of skin: Secondary | ICD-10-CM | POA: Diagnosis not present

## 2020-12-22 ENCOUNTER — Encounter: Payer: Self-pay | Admitting: Dermatology

## 2020-12-24 DIAGNOSIS — I69359 Hemiplegia and hemiparesis following cerebral infarction affecting unspecified side: Secondary | ICD-10-CM | POA: Diagnosis not present

## 2020-12-24 DIAGNOSIS — Z794 Long term (current) use of insulin: Secondary | ICD-10-CM | POA: Diagnosis not present

## 2020-12-24 DIAGNOSIS — G250 Essential tremor: Secondary | ICD-10-CM | POA: Diagnosis not present

## 2020-12-24 DIAGNOSIS — I480 Paroxysmal atrial fibrillation: Secondary | ICD-10-CM | POA: Diagnosis not present

## 2020-12-24 DIAGNOSIS — Z8673 Personal history of transient ischemic attack (TIA), and cerebral infarction without residual deficits: Secondary | ICD-10-CM | POA: Diagnosis not present

## 2020-12-24 DIAGNOSIS — H547 Unspecified visual loss: Secondary | ICD-10-CM | POA: Diagnosis not present

## 2020-12-24 DIAGNOSIS — R519 Headache, unspecified: Secondary | ICD-10-CM | POA: Diagnosis not present

## 2020-12-24 DIAGNOSIS — E1142 Type 2 diabetes mellitus with diabetic polyneuropathy: Secondary | ICD-10-CM | POA: Diagnosis not present

## 2020-12-25 DIAGNOSIS — I10 Essential (primary) hypertension: Secondary | ICD-10-CM | POA: Diagnosis not present

## 2020-12-25 DIAGNOSIS — Z87891 Personal history of nicotine dependence: Secondary | ICD-10-CM | POA: Diagnosis not present

## 2020-12-25 DIAGNOSIS — K5090 Crohn's disease, unspecified, without complications: Secondary | ICD-10-CM | POA: Diagnosis not present

## 2020-12-25 DIAGNOSIS — E785 Hyperlipidemia, unspecified: Secondary | ICD-10-CM | POA: Diagnosis not present

## 2020-12-25 DIAGNOSIS — E119 Type 2 diabetes mellitus without complications: Secondary | ICD-10-CM | POA: Diagnosis not present

## 2020-12-25 DIAGNOSIS — F32A Depression, unspecified: Secondary | ICD-10-CM | POA: Diagnosis not present

## 2020-12-25 DIAGNOSIS — Z9181 History of falling: Secondary | ICD-10-CM | POA: Diagnosis not present

## 2020-12-25 DIAGNOSIS — K5900 Constipation, unspecified: Secondary | ICD-10-CM | POA: Diagnosis not present

## 2020-12-25 DIAGNOSIS — K219 Gastro-esophageal reflux disease without esophagitis: Secondary | ICD-10-CM | POA: Diagnosis not present

## 2020-12-25 DIAGNOSIS — F410 Panic disorder [episodic paroxysmal anxiety] without agoraphobia: Secondary | ICD-10-CM | POA: Diagnosis not present

## 2020-12-25 DIAGNOSIS — Z79899 Other long term (current) drug therapy: Secondary | ICD-10-CM | POA: Diagnosis not present

## 2020-12-25 DIAGNOSIS — G250 Essential tremor: Secondary | ICD-10-CM | POA: Diagnosis not present

## 2020-12-25 DIAGNOSIS — Z794 Long term (current) use of insulin: Secondary | ICD-10-CM | POA: Diagnosis not present

## 2020-12-25 DIAGNOSIS — R519 Headache, unspecified: Secondary | ICD-10-CM | POA: Diagnosis not present

## 2020-12-25 DIAGNOSIS — H5347 Heteronymous bilateral field defects: Secondary | ICD-10-CM | POA: Diagnosis not present

## 2020-12-25 DIAGNOSIS — Z7902 Long term (current) use of antithrombotics/antiplatelets: Secondary | ICD-10-CM | POA: Diagnosis not present

## 2020-12-25 DIAGNOSIS — I69354 Hemiplegia and hemiparesis following cerebral infarction affecting left non-dominant side: Secondary | ICD-10-CM | POA: Diagnosis not present

## 2020-12-25 DIAGNOSIS — E039 Hypothyroidism, unspecified: Secondary | ICD-10-CM | POA: Diagnosis not present

## 2020-12-25 DIAGNOSIS — Z7982 Long term (current) use of aspirin: Secondary | ICD-10-CM | POA: Diagnosis not present

## 2020-12-26 DIAGNOSIS — H5347 Heteronymous bilateral field defects: Secondary | ICD-10-CM | POA: Diagnosis not present

## 2020-12-26 DIAGNOSIS — I69354 Hemiplegia and hemiparesis following cerebral infarction affecting left non-dominant side: Secondary | ICD-10-CM | POA: Diagnosis not present

## 2020-12-26 DIAGNOSIS — E039 Hypothyroidism, unspecified: Secondary | ICD-10-CM | POA: Diagnosis not present

## 2020-12-26 DIAGNOSIS — K219 Gastro-esophageal reflux disease without esophagitis: Secondary | ICD-10-CM | POA: Diagnosis not present

## 2020-12-26 DIAGNOSIS — E119 Type 2 diabetes mellitus without complications: Secondary | ICD-10-CM | POA: Diagnosis not present

## 2020-12-26 DIAGNOSIS — R519 Headache, unspecified: Secondary | ICD-10-CM | POA: Diagnosis not present

## 2020-12-26 DIAGNOSIS — I10 Essential (primary) hypertension: Secondary | ICD-10-CM | POA: Diagnosis not present

## 2020-12-26 DIAGNOSIS — Z7982 Long term (current) use of aspirin: Secondary | ICD-10-CM | POA: Diagnosis not present

## 2020-12-26 DIAGNOSIS — Z9181 History of falling: Secondary | ICD-10-CM | POA: Diagnosis not present

## 2020-12-26 DIAGNOSIS — Z87891 Personal history of nicotine dependence: Secondary | ICD-10-CM | POA: Diagnosis not present

## 2020-12-26 DIAGNOSIS — Z7902 Long term (current) use of antithrombotics/antiplatelets: Secondary | ICD-10-CM | POA: Diagnosis not present

## 2020-12-26 DIAGNOSIS — E785 Hyperlipidemia, unspecified: Secondary | ICD-10-CM | POA: Diagnosis not present

## 2020-12-26 DIAGNOSIS — Z794 Long term (current) use of insulin: Secondary | ICD-10-CM | POA: Diagnosis not present

## 2020-12-26 DIAGNOSIS — F410 Panic disorder [episodic paroxysmal anxiety] without agoraphobia: Secondary | ICD-10-CM | POA: Diagnosis not present

## 2020-12-26 DIAGNOSIS — K5900 Constipation, unspecified: Secondary | ICD-10-CM | POA: Diagnosis not present

## 2020-12-26 DIAGNOSIS — F32A Depression, unspecified: Secondary | ICD-10-CM | POA: Diagnosis not present

## 2020-12-26 DIAGNOSIS — Z79899 Other long term (current) drug therapy: Secondary | ICD-10-CM | POA: Diagnosis not present

## 2020-12-26 DIAGNOSIS — G250 Essential tremor: Secondary | ICD-10-CM | POA: Diagnosis not present

## 2020-12-26 DIAGNOSIS — K5090 Crohn's disease, unspecified, without complications: Secondary | ICD-10-CM | POA: Diagnosis not present

## 2021-01-02 ENCOUNTER — Telehealth: Payer: Self-pay | Admitting: Podiatry

## 2021-01-02 NOTE — Telephone Encounter (Signed)
Pt called and left message checking on diabetic shoes.  I returned call a told pt that I just got information from the company and there was a delay and they should be shipping next week.

## 2021-01-03 DIAGNOSIS — Z79899 Other long term (current) drug therapy: Secondary | ICD-10-CM | POA: Diagnosis not present

## 2021-01-03 DIAGNOSIS — Z87891 Personal history of nicotine dependence: Secondary | ICD-10-CM | POA: Diagnosis not present

## 2021-01-03 DIAGNOSIS — Z794 Long term (current) use of insulin: Secondary | ICD-10-CM | POA: Diagnosis not present

## 2021-01-03 DIAGNOSIS — K5900 Constipation, unspecified: Secondary | ICD-10-CM | POA: Diagnosis not present

## 2021-01-03 DIAGNOSIS — G250 Essential tremor: Secondary | ICD-10-CM | POA: Diagnosis not present

## 2021-01-03 DIAGNOSIS — Z7902 Long term (current) use of antithrombotics/antiplatelets: Secondary | ICD-10-CM | POA: Diagnosis not present

## 2021-01-03 DIAGNOSIS — H5347 Heteronymous bilateral field defects: Secondary | ICD-10-CM | POA: Diagnosis not present

## 2021-01-03 DIAGNOSIS — E039 Hypothyroidism, unspecified: Secondary | ICD-10-CM | POA: Diagnosis not present

## 2021-01-03 DIAGNOSIS — K5090 Crohn's disease, unspecified, without complications: Secondary | ICD-10-CM | POA: Diagnosis not present

## 2021-01-03 DIAGNOSIS — I10 Essential (primary) hypertension: Secondary | ICD-10-CM | POA: Diagnosis not present

## 2021-01-03 DIAGNOSIS — E785 Hyperlipidemia, unspecified: Secondary | ICD-10-CM | POA: Diagnosis not present

## 2021-01-03 DIAGNOSIS — K219 Gastro-esophageal reflux disease without esophagitis: Secondary | ICD-10-CM | POA: Diagnosis not present

## 2021-01-03 DIAGNOSIS — F32A Depression, unspecified: Secondary | ICD-10-CM | POA: Diagnosis not present

## 2021-01-03 DIAGNOSIS — R519 Headache, unspecified: Secondary | ICD-10-CM | POA: Diagnosis not present

## 2021-01-03 DIAGNOSIS — I69354 Hemiplegia and hemiparesis following cerebral infarction affecting left non-dominant side: Secondary | ICD-10-CM | POA: Diagnosis not present

## 2021-01-03 DIAGNOSIS — F410 Panic disorder [episodic paroxysmal anxiety] without agoraphobia: Secondary | ICD-10-CM | POA: Diagnosis not present

## 2021-01-03 DIAGNOSIS — Z9181 History of falling: Secondary | ICD-10-CM | POA: Diagnosis not present

## 2021-01-03 DIAGNOSIS — Z7982 Long term (current) use of aspirin: Secondary | ICD-10-CM | POA: Diagnosis not present

## 2021-01-03 DIAGNOSIS — E119 Type 2 diabetes mellitus without complications: Secondary | ICD-10-CM | POA: Diagnosis not present

## 2021-01-08 ENCOUNTER — Encounter: Payer: Self-pay | Admitting: Oncology

## 2021-01-08 ENCOUNTER — Inpatient Hospital Stay: Payer: HMO | Attending: Oncology | Admitting: Oncology

## 2021-01-08 ENCOUNTER — Other Ambulatory Visit: Payer: Self-pay

## 2021-01-08 ENCOUNTER — Inpatient Hospital Stay: Payer: HMO

## 2021-01-08 VITALS — BP 152/84 | HR 73 | Temp 97.8°F | Resp 16 | Ht 64.0 in | Wt 149.3 lb

## 2021-01-08 DIAGNOSIS — Z8673 Personal history of transient ischemic attack (TIA), and cerebral infarction without residual deficits: Secondary | ICD-10-CM | POA: Insufficient documentation

## 2021-01-08 DIAGNOSIS — Z7902 Long term (current) use of antithrombotics/antiplatelets: Secondary | ICD-10-CM | POA: Insufficient documentation

## 2021-01-08 DIAGNOSIS — Z7982 Long term (current) use of aspirin: Secondary | ICD-10-CM | POA: Diagnosis not present

## 2021-01-08 DIAGNOSIS — D6861 Antiphospholipid syndrome: Secondary | ICD-10-CM | POA: Diagnosis not present

## 2021-01-08 NOTE — Progress Notes (Signed)
Hematology/Oncology Consult note [facility] Telephone:([phone] [phone]  Patient Care Team: Juluis Pitch, MD as PCP - General (Family Medicine)   Name of the patient: [name]  [id number]  09/20/1950    Reason for referral-abnormal anticardiolipin antibody   Referring physician-Dr. Manuella Ghazi  Date of visit: 01/08/21   History of presenting illness- Patient is a 70 year old female with history of ischemic stroke in 2009.  She had an outpatient MRI in September 2022 which also showed a left [place] temporal lobe infarct.  Patient has baseline left hemiparesis with loss of vision in most of her left visual field and left-sided neglect.  She is currently on aspirin and Plavix.  She follows up with neurology and cardiology.  As a part of her stroke work-up patient had antiphospholipid antibody panel checked which showed no evidence of lupus anticoagulant.  Beta-2 glycoprotein IgG IgM negative.  IgM anticardiolipin antibody titers mildly elevated at 18.  ECOG PS- 2  Pain scale- 0   Review of systems- Review of Systems  Constitutional:  Positive for malaise/fatigue. Negative for chills, fever and weight loss.  HENT:  Negative for congestion, ear discharge and nosebleeds.   Eyes:  Negative for blurred vision.  Respiratory:  Negative for cough, hemoptysis, sputum production, shortness of breath and wheezing.   Cardiovascular:  Negative for chest pain, palpitations, orthopnea and claudication.  Gastrointestinal:  Negative for abdominal pain, blood in stool, constipation, diarrhea, heartburn, melena, nausea and vomiting.  Genitourinary:  Negative for dysuria, flank pain, frequency, hematuria and urgency.  Musculoskeletal:  Negative for back pain, joint pain and myalgias.  Skin:  Negative for rash.  Neurological:  Negative for dizziness, tingling, focal weakness, seizures, weakness and headaches.       Left-sided weakness  Endo/Heme/Allergies:  Does  not bruise/bleed easily.  Psychiatric/Behavioral:  Negative for depression and suicidal ideas. The patient does not have insomnia.    Allergies  Allergen Reactions   Codeine Other (See Comments)    Syncope.   Morphine And Related Nausea And Vomiting and Other (See Comments)    Hallucinations.   Baclofen Nausea Only and Other (See Comments)    dizziness   Metformin Hcl Other (See Comments)   Penicillins Other (See Comments)    Family history   Sulfamethoxazole Itching    Hands itching    Elemental Sulfur Itching    Per patient happened years ago.    Patient Active Problem List   Diagnosis Date Noted   CVA (cerebral vascular accident) ([place]) 11/01/2020   Chronic pain 04/25/2020   Chronic constipation 04/25/2020   Gastro-esophageal reflux disease without esophagitis 04/25/2020   Hardening of the aorta (main artery of the heart) ([place]) 04/25/2020   History of iron deficiency anemia 04/25/2020   Hypercholesteremia 04/25/2020   Hypertension 04/25/2020   Personal history of transient ischemic attack (TIA), and cerebral infarction without residual deficits 04/25/2020   Pulmonary emphysema ([place]) 04/25/2020   Vitamin D deficiency 04/25/2020   Tremor, essential 04/24/2020   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   Right lower quadrant abdominal pain 08/18/2018   Spastic hemiplegia affecting nondominant side ([place]) 02/03/2014   Diabetes mellitus ([place]) 09/07/2013   Hypothyroidism 09/07/2013   Type 2 diabetes mellitus without complications ([place]) 37/16/9678   Hemiparesis and alteration of sensations as late effects of stroke ([place]) 01/26/2013   Abnormality of gait 01/26/2013   Dizziness and giddiness 01/26/2013     Past Medical History:  Diagnosis Date   Abdominal pain 12/06/2013  Abnormality of gait 01/26/2013   Altered bowel habits 12/11/2017   Bruises easily    Constipation    Crohn's disease ([place])    Depression    Dizziness and giddiness 01/26/2013   Fecal incontinence  alternating with constipation 12/11/2017   GERD (gastroesophageal reflux disease)    Hemianopia    left   Hemiparesis and alteration of sensations as late effects of stroke ([place]) 01/26/2013   Hyperlipidemia    Hypertension    Hypothyroidism    Lump in female breast    Panic disorder    PONV (postoperative nausea and vomiting)    Stroke ([place]) 2009   Tremor, essential 04/24/2020   Type II diabetes mellitus ([place])      Past Surgical History:  Procedure Laterality Date   ANAL FISSURE REPAIR  2008   APPENDECTOMY  1986   BREAST BIOPSY Left 2006   neg   BREAST LUMPECTOMY Right 1990's   CESAREAN SECTION  1981; [place]  05/1991   TUBAL LIGATION  1983    Social History   Socioeconomic History   Marital status: Married    Spouse name: [name]   Number of children: 2   Years of education: doctorate   Highest education level: Not on file  Occupational History   Occupation: Electrical engineer  Tobacco Use   Smoking status: Former    Packs/day: 1.00    Years: 20.00    Pack years: 20.00    Types: Cigarettes    Quit date: 04/16/1993    Years since quitting: 27.7   Smokeless tobacco: Never  Substance and Sexual Activity   Alcohol use: Not Currently    Comment: "haven't drank since early 1980's"   Drug use: No   Sexual activity: Yes  Other Topics Concern   Not on file  Social History Narrative   Patient is married ([name]), has 2 children   Patient is right handed   Caffeine consumption is rarely, drinks decaf   Social Determinants of Health   Financial Resource Strain: Not on file  Food Insecurity: Not on file  Transportation Needs: Not on file  Physical Activity: Not on file  Stress: Not on file  Social Connections: Not on file  Intimate Partner Violence: Not on file     Family History  Problem Relation Age of Onset   Heart disease Mother    Heart disease Father    Diabetes Brother    Diabetes Brother    Migraines Son       Current Outpatient Medications:    Alpha-Lipoic Acid 600 MG TABS, Take 600 mg by mouth daily., Disp: , Rfl:    aspirin 81 MG tablet, Take 81 mg by mouth daily., Disp: , Rfl:    clopidogrel (PLAVIX) 75 MG tablet, Take 1 tablet (75 mg total) by mouth daily., Disp: 30 tablet, Rfl: 0   Cyanocobalamin (VITAMIN B12) 1000 MCG TBCR, , Disp: , Rfl:    glucose blood (ACCU-CHEK GUIDE) test strip, Check blood sugar, Disp: , Rfl:    insulin degludec (TRESIBA FLEXTOUCH) 100 UNIT/ML FlexTouch Pen, Inject 36 Units into the skin daily., Disp: , Rfl:    Insulin Pen Needle (PEN NEEDLES) 32G X 4 MM MISC, BD Nano 2nd Gen Pen Needle 32 gauge x 5/32"  USE DAILY WITH INSULIN, Disp: , Rfl:    levothyroxine (SYNTHROID) 137 MCG tablet, Take 137 mcg by mouth every morning., Disp: , Rfl:    metoprolol succinate (  TOPROL-XL) 25 MG 24 hr tablet, Take 1 tablet by mouth daily., Disp: , Rfl:    Multiple Vitamins-Minerals (MULTIVITAMIN & MINERAL) LIQD, , Disp: , Rfl:    pantoprazole (PROTONIX) 40 MG tablet, Take 40 mg by mouth daily. , Disp: , Rfl:    ramipril (ALTACE) 2.5 MG capsule, Take 2.5 mg by mouth daily. , Disp: , Rfl:    simvastatin (ZOCOR) 20 MG tablet, SMARTSIG:1 Tablet(s) By Mouth Every Evening, Disp: , Rfl:    colestipol (COLESTID) 1 g tablet, , Disp: , Rfl:    diazepam (VALIUM) 2 MG tablet, Take 1 tablet (2 mg total) by mouth at bedtime. (Patient not taking: No sig reported), Disp: 30 tablet, Rfl: 2   ketoconazole (NIZORAL) 2 % cream, Apply 1 application topically daily. (Patient not taking: Reported on 01/08/2021), Disp: 60 g, Rfl: 2   mupirocin ointment (BACTROBAN) 2 %, Apply to healing wound QD until healed., Disp: 22 g, Rfl: 0   OZEMPIC, 0.25 OR 0.5 MG/DOSE, 2 MG/1.5ML SOPN, Inject 0.5 mg into the skin once a week. (Patient not taking: Reported on 01/08/2021), Disp: , Rfl:    sennosides-docusate sodium (SENOKOT-S) 8.6-50 MG tablet, , Disp: , Rfl:    Physical exam:  Vitals:   01/08/21 1515  BP: (!)  152/84  Pulse: 73  Resp: 16  Temp: 97.8 F (36.6 C)  SpO2: 100%  Weight: 149 lb 4.8 oz (67.7 kg)  Height: 5' 4"  (1.626 m)   Physical Exam Constitutional:      Comments: Patient is sitting in a wheelchair.  Appears in no acute distress  Cardiovascular:     Rate and Rhythm: Normal rate and regular rhythm.     Heart sounds: Normal heart sounds.  Pulmonary:     Effort: Pulmonary effort is normal.     Breath sounds: Normal breath sounds.  Skin:    General: Skin is warm and dry.  Neurological:     Mental Status: She is alert and oriented to person, place, and time.     Comments: Left upper extremity spasticity and hemiparesis       CMP Latest Ref Rng & Units 11/01/2020  Glucose 70 - 99 mg/dL -  BUN 8 - 23 mg/dL -  Creatinine 0.44 - 1.00 mg/dL 0.63  Sodium 135 - 145 mmol/L -  Potassium 3.5 - 5.1 mmol/L -  Chloride 98 - 111 mmol/L -  CO2 22 - 32 mmol/L -  Calcium 8.9 - 10.3 mg/dL -  Total Protein 6.5 - 8.1 g/dL -  Total Bilirubin 0.3 - 1.2 mg/dL -  Alkaline Phos 38 - 126 U/L -  AST 15 - 41 U/L -  ALT 0 - 44 U/L -   CBC Latest Ref Rng & Units 11/01/2020  WBC 4.0 - 10.5 K/uL 5.9  Hemoglobin 12.0 - 15.0 g/dL 11.1(L)  Hematocrit 36.0 - 46.0 % 35.3(L)  Platelets 150 - 400 K/uL 230   Assessment and plan- Patient is a 70 y.o. female with history of recurrent strokes referred to hematology for mildly elevated anticardiolipin antibody titer of 18  To diagnose antiphospholipid antibody syndrome either beta-2 glycoprotein or anticardiolipin antibody titer should be greater than 40.  Patient does not have evidence of lupus anticoagulant and her beta-2 glycoprotein levels were normal.  Mildly elevated IgM cardiolipin antibody titer of 18 would not fit the diagnosis of antiphospholipid antibody syndrome.  She does not require any further work-up for this from hematology standpoint and can continue to follow-up with neurology.  Anticoagulation for her stroke to be decided by neurology and as  such since patient does not meet criteria for antiphospholipid antibody syndrome she does not require to be switched to Coumadin at this time   Thank you for this kind referral and the opportunity to participate in the care of this patient   Visit Diagnosis 1. Anti-cardiolipin antibody syndrome (HCC)     Dr. Randa Evens, MD, MPH [facility] at [facility] [phone] 01/08/2021

## 2021-01-23 DIAGNOSIS — J431 Panlobular emphysema: Secondary | ICD-10-CM | POA: Diagnosis not present

## 2021-01-23 DIAGNOSIS — I10 Essential (primary) hypertension: Secondary | ICD-10-CM | POA: Diagnosis not present

## 2021-01-23 DIAGNOSIS — I700 Atherosclerosis of aorta: Secondary | ICD-10-CM | POA: Diagnosis not present

## 2021-01-23 DIAGNOSIS — I6930 Unspecified sequelae of cerebral infarction: Secondary | ICD-10-CM | POA: Diagnosis not present

## 2021-01-24 ENCOUNTER — Other Ambulatory Visit: Payer: Self-pay

## 2021-01-24 ENCOUNTER — Ambulatory Visit: Payer: HMO | Admitting: Podiatry

## 2021-01-24 ENCOUNTER — Encounter: Payer: Self-pay | Admitting: Podiatry

## 2021-01-24 DIAGNOSIS — I69051 Hemiplegia and hemiparesis following nontraumatic subarachnoid hemorrhage affecting right dominant side: Secondary | ICD-10-CM | POA: Diagnosis not present

## 2021-01-24 DIAGNOSIS — Z794 Long term (current) use of insulin: Secondary | ICD-10-CM | POA: Diagnosis not present

## 2021-01-24 DIAGNOSIS — E1142 Type 2 diabetes mellitus with diabetic polyneuropathy: Secondary | ICD-10-CM

## 2021-01-24 DIAGNOSIS — M79675 Pain in left toe(s): Secondary | ICD-10-CM | POA: Diagnosis not present

## 2021-01-24 DIAGNOSIS — B351 Tinea unguium: Secondary | ICD-10-CM

## 2021-01-24 DIAGNOSIS — M79674 Pain in right toe(s): Secondary | ICD-10-CM

## 2021-01-24 NOTE — Progress Notes (Signed)
This patient returns to my office for at risk foot care.  This patient requires this care by a professional since this patient will be at risk due to having diabetes mellitus and dropfoot left foot.  This patient is unable to cut nails herself since the patient cannot reach her nails.These nails are painful walking and wearing shoes.  This patient presents for at risk foot care today.  General Appearance  Alert, conversant and in no acute stress.  Vascular  Dorsalis pedis and posterior tibial  pulses are palpable  bilaterally.  Capillary return is within normal limits  bilaterally. Temperature is within normal limits  Bilaterally.  Swelling left leg and foot.  Neurologic  Senn-Weinstein monofilament wire test diminished   bilaterally. Muscle power within normal limits bilaterally.  Nails Thick disfigured discolored nails with subungual debris  from hallux to fifth toes bilaterally. No evidence of bacterial infection or drainage bilaterally.  Orthopedic  No crepitus or effusions noted.  No bony pathology or digital deformities noted.  Normal motion and strength right foot.  Limited ROM and muscle strength left foot.  Skin  normotropic skin with no porokeratosis noted bilaterally.  No signs of infections or ulcers noted.     Onychomycosis  Pain in right toes  Pain in left toes  Consent was obtained for treatment procedures.   Mechanical debridement of nails 1-5  bilaterally performed with a nail nipper.  Filed with dremel without incident.  Dsp. Diabetic shoes.  Patient to take shoes home and try them due to her left foot brace.   Return office visit   8 weeks                  Told patient to return for periodic foot care and evaluation due to potential at risk complications.   Gardiner Barefoot DPM

## 2021-02-08 ENCOUNTER — Ambulatory Visit: Payer: HMO

## 2021-02-08 ENCOUNTER — Other Ambulatory Visit: Payer: Self-pay

## 2021-02-08 DIAGNOSIS — E1142 Type 2 diabetes mellitus with diabetic polyneuropathy: Secondary | ICD-10-CM

## 2021-02-08 DIAGNOSIS — B353 Tinea pedis: Secondary | ICD-10-CM

## 2021-02-08 DIAGNOSIS — I69359 Hemiplegia and hemiparesis following cerebral infarction affecting unspecified side: Secondary | ICD-10-CM

## 2021-02-08 DIAGNOSIS — I69398 Other sequelae of cerebral infarction: Secondary | ICD-10-CM

## 2021-02-08 NOTE — Progress Notes (Signed)
SITUATION Reason for Consult: Follow-up with diabetic shoes and insoles Patient / Caregiver Report: Patient did not understand that insoles are a necessary part of the delivery and has not used them, nor does she use the shoes. They feel too loose and stretchy  OBJECTIVE DATA History / Diagnosis:    ICD-10-CM   1. Type 2 diabetes mellitus with diabetic polyneuropathy, with long-term current use of insulin (HCC)  E11.42    Z79.4     2. Tinea pedis of both feet  B35.3     3. Hemiparesis and alteration of sensations as late effects of stroke [facility])  Q33.007    M22.633       Change in Pathology: none  ACTIONS PERFORMED Patient's equipment was checked for structural stability and fit. Took unused orthofeet shoes back and will order Athletic [employer] Mae Double Strap [id number] size 8.81M All questions answered and concerns addressed.  PLAN Patient to bring insoles for modification when shoes available. Plan of care discussed with and agreed upon by patient / caregiver.

## 2021-02-28 DIAGNOSIS — M6281 Muscle weakness (generalized): Secondary | ICD-10-CM | POA: Diagnosis not present

## 2021-03-07 ENCOUNTER — Other Ambulatory Visit: Payer: Self-pay

## 2021-03-07 ENCOUNTER — Emergency Department: Payer: HMO

## 2021-03-07 ENCOUNTER — Inpatient Hospital Stay
Admission: EM | Admit: 2021-03-07 | Discharge: 2021-03-09 | DRG: 065 | Disposition: A | Discharge status: Home Health | Payer: HMO | Attending: Internal Medicine | Admitting: Internal Medicine

## 2021-03-07 DIAGNOSIS — Z79899 Other long term (current) drug therapy: Secondary | ICD-10-CM

## 2021-03-07 DIAGNOSIS — Z7985 Long-term (current) use of injectable non-insulin antidiabetic drugs: Secondary | ICD-10-CM

## 2021-03-07 DIAGNOSIS — R2681 Unsteadiness on feet: Secondary | ICD-10-CM

## 2021-03-07 DIAGNOSIS — I669 Occlusion and stenosis of unspecified cerebral artery: Secondary | ICD-10-CM | POA: Diagnosis not present

## 2021-03-07 DIAGNOSIS — G8194 Hemiplegia, unspecified affecting left nondominant side: Secondary | ICD-10-CM

## 2021-03-07 DIAGNOSIS — R519 Headache, unspecified: Secondary | ICD-10-CM | POA: Diagnosis not present

## 2021-03-07 DIAGNOSIS — M24542 Contracture, left hand: Secondary | ICD-10-CM | POA: Diagnosis not present

## 2021-03-07 DIAGNOSIS — I639 Cerebral infarction, unspecified: Secondary | ICD-10-CM | POA: Diagnosis present

## 2021-03-07 DIAGNOSIS — Z888 Allergy status to other drugs, medicaments and biological substances status: Secondary | ICD-10-CM | POA: Diagnosis not present

## 2021-03-07 DIAGNOSIS — H5347 Heteronymous bilateral field defects: Secondary | ICD-10-CM | POA: Diagnosis present

## 2021-03-07 DIAGNOSIS — K219 Gastro-esophageal reflux disease without esophagitis: Secondary | ICD-10-CM | POA: Diagnosis present

## 2021-03-07 DIAGNOSIS — H5340 Unspecified visual field defects: Secondary | ICD-10-CM | POA: Diagnosis present

## 2021-03-07 DIAGNOSIS — I6381 Other cerebral infarction due to occlusion or stenosis of small artery: Secondary | ICD-10-CM | POA: Diagnosis not present

## 2021-03-07 DIAGNOSIS — I63431 Cerebral infarction due to embolism of right posterior cerebral artery: Secondary | ICD-10-CM | POA: Diagnosis not present

## 2021-03-07 DIAGNOSIS — Z880 Allergy status to penicillin: Secondary | ICD-10-CM | POA: Diagnosis not present

## 2021-03-07 DIAGNOSIS — G939 Disorder of brain, unspecified: Secondary | ICD-10-CM

## 2021-03-07 DIAGNOSIS — E781 Pure hyperglyceridemia: Secondary | ICD-10-CM | POA: Diagnosis not present

## 2021-03-07 DIAGNOSIS — R531 Weakness: Secondary | ICD-10-CM | POA: Diagnosis present

## 2021-03-07 DIAGNOSIS — E785 Hyperlipidemia, unspecified: Secondary | ICD-10-CM | POA: Diagnosis not present

## 2021-03-07 DIAGNOSIS — K5090 Crohn's disease, unspecified, without complications: Secondary | ICD-10-CM | POA: Diagnosis not present

## 2021-03-07 DIAGNOSIS — I69398 Other sequelae of cerebral infarction: Secondary | ICD-10-CM

## 2021-03-07 DIAGNOSIS — Z885 Allergy status to narcotic agent status: Secondary | ICD-10-CM | POA: Diagnosis not present

## 2021-03-07 DIAGNOSIS — E119 Type 2 diabetes mellitus without complications: Secondary | ICD-10-CM | POA: Diagnosis present

## 2021-03-07 DIAGNOSIS — G8110 Spastic hemiplegia affecting unspecified side: Secondary | ICD-10-CM | POA: Diagnosis present

## 2021-03-07 DIAGNOSIS — Z8249 Family history of ischemic heart disease and other diseases of the circulatory system: Secondary | ICD-10-CM

## 2021-03-07 DIAGNOSIS — Z7982 Long term (current) use of aspirin: Secondary | ICD-10-CM | POA: Diagnosis not present

## 2021-03-07 DIAGNOSIS — I6930 Unspecified sequelae of cerebral infarction: Secondary | ICD-10-CM | POA: Diagnosis not present

## 2021-03-07 DIAGNOSIS — G319 Degenerative disease of nervous system, unspecified: Secondary | ICD-10-CM | POA: Diagnosis not present

## 2021-03-07 DIAGNOSIS — R29705 NIHSS score 5: Secondary | ICD-10-CM | POA: Diagnosis present

## 2021-03-07 DIAGNOSIS — I69354 Hemiplegia and hemiparesis following cerebral infarction affecting left non-dominant side: Secondary | ICD-10-CM | POA: Diagnosis not present

## 2021-03-07 DIAGNOSIS — I10 Essential (primary) hypertension: Secondary | ICD-10-CM | POA: Diagnosis not present

## 2021-03-07 DIAGNOSIS — I672 Cerebral atherosclerosis: Secondary | ICD-10-CM | POA: Diagnosis present

## 2021-03-07 DIAGNOSIS — E039 Hypothyroidism, unspecified: Secondary | ICD-10-CM | POA: Diagnosis not present

## 2021-03-07 DIAGNOSIS — Z833 Family history of diabetes mellitus: Secondary | ICD-10-CM

## 2021-03-07 DIAGNOSIS — I6523 Occlusion and stenosis of bilateral carotid arteries: Secondary | ICD-10-CM | POA: Diagnosis not present

## 2021-03-07 DIAGNOSIS — Z794 Long term (current) use of insulin: Secondary | ICD-10-CM

## 2021-03-07 DIAGNOSIS — G459 Transient cerebral ischemic attack, unspecified: Secondary | ICD-10-CM | POA: Diagnosis not present

## 2021-03-07 DIAGNOSIS — Z882 Allergy status to sulfonamides status: Secondary | ICD-10-CM

## 2021-03-07 DIAGNOSIS — E7800 Pure hypercholesterolemia, unspecified: Secondary | ICD-10-CM | POA: Diagnosis not present

## 2021-03-07 DIAGNOSIS — E7849 Other hyperlipidemia: Secondary | ICD-10-CM | POA: Diagnosis not present

## 2021-03-07 DIAGNOSIS — Z20822 Contact with and (suspected) exposure to covid-19: Secondary | ICD-10-CM | POA: Diagnosis present

## 2021-03-07 DIAGNOSIS — I63532 Cerebral infarction due to unspecified occlusion or stenosis of left posterior cerebral artery: Secondary | ICD-10-CM | POA: Diagnosis not present

## 2021-03-07 DIAGNOSIS — Z87891 Personal history of nicotine dependence: Secondary | ICD-10-CM

## 2021-03-07 DIAGNOSIS — Z7902 Long term (current) use of antithrombotics/antiplatelets: Secondary | ICD-10-CM

## 2021-03-07 LAB — CBC
HCT: 37.7 % (ref 36.0–46.0)
Hemoglobin: 11.5 g/dL — ABNORMAL LOW (ref 12.0–15.0)
MCH: 23.7 pg — ABNORMAL LOW (ref 26.0–34.0)
MCHC: 30.5 g/dL (ref 30.0–36.0)
MCV: 77.7 fL — ABNORMAL LOW (ref 80.0–100.0)
Platelets: 238 10*3/uL (ref 150–400)
RBC: 4.85 MIL/uL (ref 3.87–5.11)
RDW: 16.2 % — ABNORMAL HIGH (ref 11.5–15.5)
WBC: 7.1 10*3/uL (ref 4.0–10.5)
nRBC: 0 % (ref 0.0–0.2)

## 2021-03-07 LAB — RESP PANEL BY RT-PCR (FLU A&B, COVID) ARPGX2
Influenza A by PCR: NEGATIVE
Influenza B by PCR: NEGATIVE
SARS Coronavirus 2 by RT PCR: NEGATIVE

## 2021-03-07 LAB — COMPREHENSIVE METABOLIC PANEL
ALT: 15 U/L (ref 0–44)
AST: 20 U/L (ref 15–41)
Albumin: 4.3 g/dL (ref 3.5–5.0)
Alkaline Phosphatase: 54 U/L (ref 38–126)
Anion gap: 7 (ref 5–15)
BUN: 10 mg/dL (ref 8–23)
CO2: 27 mmol/L (ref 22–32)
Calcium: 9.4 mg/dL (ref 8.9–10.3)
Chloride: 102 mmol/L (ref 98–111)
Creatinine, Ser: 0.76 mg/dL (ref 0.44–1.00)
GFR, Estimated: >60 mL/min (ref >60)
Glucose, Bld: 158 mg/dL — ABNORMAL HIGH (ref 70–99)
Potassium: 3.7 mmol/L (ref 3.5–5.1)
Sodium: 136 mmol/L (ref 135–145)
Total Bilirubin: 0.4 mg/dL (ref 0.3–1.2)
Total Protein: 7.5 g/dL (ref 6.5–8.1)

## 2021-03-07 LAB — APTT: aPTT: 31 seconds (ref 24–36)

## 2021-03-07 LAB — DIFFERENTIAL
Abs Immature Granulocytes: 0.03 10*3/uL (ref 0.00–0.07)
Basophils Absolute: 0 10*3/uL (ref 0.0–0.1)
Basophils Relative: 1 %
Eosinophils Absolute: 0.2 10*3/uL (ref 0.0–0.5)
Eosinophils Relative: 2 %
Immature Granulocytes: 0 %
Lymphocytes Relative: 25 %
Lymphs Abs: 1.8 10*3/uL (ref 0.7–4.0)
Monocytes Absolute: 0.6 10*3/uL (ref 0.1–1.0)
Monocytes Relative: 8 %
Neutro Abs: 4.5 10*3/uL (ref 1.7–7.7)
Neutrophils Relative %: 64 %

## 2021-03-07 LAB — PROTIME-INR
INR: 1 (ref 0.8–1.2)
Prothrombin Time: 13.4 seconds (ref 11.4–15.2)

## 2021-03-07 MED ORDER — ASPIRIN 81 MG PO CHEW
324.0000 mg | CHEWABLE_TABLET | Freq: Once | ORAL | Status: AC
  Administered 2021-03-07: 324 mg via ORAL
  Filled 2021-03-07: qty 4

## 2021-03-07 MED ORDER — ACETAMINOPHEN 160 MG/5ML PO SOLN
650.0000 mg | ORAL | Status: DC | PRN
  Filled 2021-03-07: qty 20.3

## 2021-03-07 MED ORDER — SODIUM CHLORIDE 0.9 % IV SOLN: INTRAVENOUS | Status: DC

## 2021-03-07 MED ORDER — ASPIRIN 300 MG RE SUPP: 300.0000 mg | Freq: Every day | RECTAL | Status: DC

## 2021-03-07 MED ORDER — INSULIN ASPART 100 UNIT/ML IJ SOLN
0.0000 [IU] | Freq: Three times a day (TID) | INTRAMUSCULAR | Status: DC
  Administered 2021-03-08: 3 [IU] via SUBCUTANEOUS
  Filled 2021-03-07: qty 1

## 2021-03-07 MED ORDER — PANTOPRAZOLE SODIUM 40 MG PO TBEC
40.0000 mg | DELAYED_RELEASE_TABLET | Freq: Every day | ORAL | Status: DC
  Administered 2021-03-08 – 2021-03-09 (×2): 40 mg via ORAL
  Filled 2021-03-07 (×2): qty 1

## 2021-03-07 MED ORDER — CLOPIDOGREL BISULFATE 75 MG PO TABS
75.0000 mg | ORAL_TABLET | Freq: Every evening | ORAL | Status: DC
  Administered 2021-03-08: 18:00:00 75 mg via ORAL
  Filled 2021-03-07 (×2): qty 1

## 2021-03-07 MED ORDER — ASPIRIN EC 81 MG PO TBEC
81.0000 mg | DELAYED_RELEASE_TABLET | Freq: Every day | ORAL | Status: DC
  Administered 2021-03-08 – 2021-03-09 (×2): 81 mg via ORAL
  Filled 2021-03-07 (×2): qty 1

## 2021-03-07 MED ORDER — LEVOTHYROXINE SODIUM 137 MCG PO TABS
137.0000 ug | ORAL_TABLET | Freq: Every day | ORAL | Status: DC
  Administered 2021-03-08 – 2021-03-09 (×2): 137 ug via ORAL
  Filled 2021-03-07 (×2): qty 1

## 2021-03-07 MED ORDER — ASPIRIN 325 MG PO TABS
325.0000 mg | ORAL_TABLET | Freq: Every day | ORAL | Status: DC
  Filled 2021-03-07: qty 1

## 2021-03-07 MED ORDER — MAGNESIUM HYDROXIDE 400 MG/5ML PO SUSP
30.0000 mL | Freq: Every day | ORAL | Status: DC | PRN
  Filled 2021-03-07: qty 30

## 2021-03-07 MED ORDER — ACETAMINOPHEN 650 MG RE SUPP
650.0000 mg | RECTAL | Status: DC | PRN
Start: 2021-03-07 — End: 2021-03-09

## 2021-03-07 MED ORDER — SIMVASTATIN 20 MG PO TABS
20.0000 mg | ORAL_TABLET | Freq: Every evening | ORAL | Status: DC
  Administered 2021-03-08: 18:00:00 20 mg via ORAL
  Filled 2021-03-07: qty 1

## 2021-03-07 MED ORDER — DULAGLUTIDE 0.75 MG/0.5ML ~~LOC~~ SOAJ: 0.7500 mg | SUBCUTANEOUS | Status: DC

## 2021-03-07 MED ORDER — ENOXAPARIN SODIUM 40 MG/0.4ML IJ SOSY
40.0000 mg | PREFILLED_SYRINGE | INTRAMUSCULAR | Status: DC
  Administered 2021-03-08 – 2021-03-09 (×2): 40 mg via SUBCUTANEOUS
  Filled 2021-03-07 (×2): qty 0.4

## 2021-03-07 MED ORDER — STROKE: EARLY STAGES OF RECOVERY BOOK: Freq: Once | Status: DC

## 2021-03-07 MED ORDER — TRAZODONE HCL 50 MG PO TABS: 25.0000 mg | ORAL_TABLET | Freq: Every evening | ORAL | Status: DC | PRN

## 2021-03-07 MED ORDER — ACETAMINOPHEN 325 MG PO TABS: 650.0000 mg | ORAL_TABLET | ORAL | Status: DC | PRN

## 2021-03-07 MED ORDER — INSULIN GLARGINE-YFGN 100 UNIT/ML ~~LOC~~ SOLN
30.0000 [IU] | Freq: Every day | SUBCUTANEOUS | Status: DC
  Filled 2021-03-07 (×2): qty 0.3

## 2021-03-07 MED ORDER — SENNOSIDES-DOCUSATE SODIUM 8.6-50 MG PO TABS: 1.0000 | ORAL_TABLET | Freq: Every evening | ORAL | Status: DC | PRN

## 2021-03-07 MED ORDER — RAMIPRIL 2.5 MG PO CAPS
2.5000 mg | ORAL_CAPSULE | Freq: Every evening | ORAL | Status: DC
  Administered 2021-03-08: 18:00:00 2.5 mg via ORAL
  Filled 2021-03-07 (×3): qty 1

## 2021-03-07 NOTE — ED Triage Notes (Signed)
Pt to ED via POV from home. Pt reports loss of balance, HA, left eye visual changes and lethargy that started 2 days ago, went away, and came back. Pt with hx of stroke with left sided deficits (2019) several TIAs after that. Pt called PCP stating she thinks she had a TIA and PCP referred her to the ED. Pt currently on Plavix.

## 2021-03-07 NOTE — ED Provider Notes (Signed)
[facility] Provider Note    Event Date/Time   First MD Initiated Contact with Patient 03/07/21 1724     (approximate)   History   Chief Complaint Weakness   HPI  [name] is a 71 y.o. female with possible history of hypertension, hyperlipidemia, diabetes, Crohn's, and stroke with left hemiparesis who presents to the ED complaining of weakness.  Patient reports that for the past 2 days she has felt slightly weaker than usual in her left arm and left leg.  This has come with a sensation of instability when she attempts to stand up, however she typically does not ambulate on her own and primarily uses a mobility scooter.  She has left visual field deficits at baseline, states that the deficit has felt slightly larger than usual over the past 2 days.  Husband also reports that she has seemed more sleepy and lethargic than usual over the past couple of days.  She denies any fevers, cough, chest pain, shortness of breath, nausea, vomiting, abdominal pain, or dysuria.  She does state that she has been dealing with urinary incontinence recently.     Physical Exam   Triage Vital Signs: ED Triage Vitals  Enc Vitals Group     BP 03/07/21 1528 (!) 155/86     Pulse Rate 03/07/21 1528 96     Resp 03/07/21 1528 18     Temp 03/07/21 1528 98.2 F (36.8 C)     Temp Source 03/07/21 1528 Oral     SpO2 03/07/21 1528 97 %     Weight 03/07/21 1529 148 lb (67.1 kg)     Height 03/07/21 1526 5\' 4"  (1.626 m)     Head Circumference --      Peak Flow --      Pain Score 03/07/21 1526 0     Pain Loc --      Pain Edu? --      Excl. in [place]? --     Most recent vital signs: Vitals:   03/07/21 1528 03/07/21 2134  BP: (!) 155/86 (!) 141/104  Pulse: 96 88  Resp: 18 17  Temp: 98.2 F (36.8 C)   SpO2: 97% 98%    Constitutional: Alert and oriented. Eyes: Conjunctivae are normal. Head: Atraumatic. Nose: No congestion/rhinnorhea. Mouth/Throat: Mucous membranes are moist.   Cardiovascular: Normal rate, regular rhythm. Grossly normal heart sounds.  2+ radial pulses bilaterally. Respiratory: Normal respiratory effort.  No retractions. Lungs CTAB. Gastrointestinal: Soft and nontender. No distention. Genitourinary: Deferred. Musculoskeletal: No lower extremity tenderness nor edema.  Neurologic:  Normal speech and language.  Left facial droop and left hemiparesis noted.  5 out of 5 strength in right upper and lower extremities.  Finger-nose testing and heel-to-shin testing intact without dysmetria on right.    ED Results / Procedures / Treatments   Labs (all labs ordered are listed, but only abnormal results are displayed) Labs Reviewed  CBC - Abnormal; Notable for the following components:      Result Value   Hemoglobin 11.5 (*)    MCV 77.7 (*)    MCH 23.7 (*)    RDW 16.2 (*)    All other components within normal limits  COMPREHENSIVE METABOLIC PANEL - Abnormal; Notable for the following components:   Glucose, Bld 158 (*)    All other components within normal limits  RESP PANEL BY RT-PCR (FLU A&B, COVID) ARPGX2  PROTIME-INR  APTT  DIFFERENTIAL  URINALYSIS, ROUTINE W REFLEX MICROSCOPIC  CBG MONITORING,  ED     EKG  ED ECG REPORT I, Blake Divine, the attending physician, personally viewed and interpreted this ECG.   Date: 03/07/2021  EKG Time: 15:32  Rate: 88  Rhythm: normal sinus rhythm  Axis: Normal  Intervals:none  ST&T Change: None  RADIOLOGY CT head reviewed by me with no obvious hemorrhage, large prior infarct noted.  PROCEDURES:  Critical Care performed: No  Procedures   MEDICATIONS ORDERED IN ED: Medications  aspirin chewable tablet 324 mg (has no administration in time range)     IMPRESSION / MDM / ASSESSMENT AND PLAN / ED COURSE  I reviewed the triage vital signs and the nursing notes.                              71 y.o. female with past medical history of hypertension, hyperlipidemia, diabetes, Crohn's, and  stroke with left hemiparesis who presents to the ED complaining of worsening weakness on the left with increased visual field deficit and sensation of being unsteady on her feet over the past 2 days.  Differential diagnosis includes, but is not limited to, acute stroke, recrudescence of prior stroke, electrolyte abnormality, UTI, viral syndrome.  Patient nontoxic-appearing and in no acute distress, vital signs are reassuring.  Patient has left hemiparesis on exam but is intact on the right, does state that symptoms have seem to be slightly worse on the left over the past 2 days.  CBC and BMP are unremarkable with no anemia or electrolyte abnormality.  CT head performed and shows old stroke but no acute process.  Patient may be having recrudescence of prior stroke and we will assess for infectious process with UA and viral testing.  We will also check MRI brain to rule out acute stroke.  MRI brain is concerning for small acute stroke affecting the left thalamus and midbrain, explaining patient's symptoms.  We will perform swallow screen and give loading dose of aspirin, case discussed with hospitalist for admission.       FINAL CLINICAL IMPRESSION(S) / ED DIAGNOSES   Final diagnoses:  Cerebrovascular accident (CVA), unspecified mechanism ([place])  Unsteady gait     Rx / DC Orders   ED Discharge Orders     None        Note:  This document was prepared using Dragon voice recognition software and may include unintentional dictation errors.   Blake Divine, MD 03/07/21 2212

## 2021-03-07 NOTE — H&P (Signed)
[place]   PATIENT NAME: [name]    MR#:  [id number]  DATE OF BIRTH:  [birth year]  DATE OF ADMISSION:  03/07/2021  PRIMARY CARE PHYSICIAN: Juluis Pitch, MD   Patient is coming from: Home  REQUESTING/REFERRING PHYSICIAN: Blake Divine, MD, MD  CHIEF COMPLAINT:   Chief Complaint  Patient presents with   Weakness    HISTORY OF PRESENT ILLNESS:  [name] is a 71 y.o. female with medical history significant for CVA with residual left-sided hemiparesis and left hand contracture, hypertension, dyslipidemia, hypothyroidism, depression, Crohn's disease, GERD, left-sided hemianopsia, type 2 diabetes mellitus and tremors, who presented to the ER with acute onset of worsening left-sided hemiparesis over the last couple of days as well as gait abnormality with hard time keeping her balance today.  She denies any headache or dizziness or blurred vision.  She felt her left hemianopsia is slightly worse.  No speech or swallowing difficulty.  No nausea or vomiting.  No urinary or stool incontinence.  No witnessed seizures.  She denied any tinnitus or vertigo.  ED Course: When she came to the ER, blood pressure was 155/86 and later 141/104 with otherwise normal vital signs.  Labs revealed unremarkable CMP and CBC showed hemoglobin 11.5 with hematocrit 37.7 with microcytosis.  Influenza antigens and COVID-19 PCR came back negative. EKG as reviewed by me : EKG showed normal sinus rhythm with a rate of 88 with low voltage QRS and T wave inversion inferiorly Imaging: Noncontrast head CTscan revealed large chronic right PCA territory infarction, advanced chronic small vessel ischemic disease and cerebral white matter and small vessel infarcts within both basal ganglia and right thalamus, right generalized parenchymal atrophy, right mastoid effusion with no acute intracranial abnormality. Brain MRI showed small acute ischemic nonhemorrhagic infarcts involving the left thalamus/midbrain and  left subinsular white matter and a large remote right PCA distribution infarct with underlying age-related cerebral atrophy and moderate chronic microvascular ischemic disease as well as several additional small remote lacunar infarcts involving both basal ganglia and right thalamus.  The patient was given 4 baby aspirin.  She will be admitted to a medical telemetry bed, for further evaluation and management. PAST MEDICAL HISTORY:   Past Medical History:  Diagnosis Date   Abdominal pain 12/06/2013   Abnormality of gait 01/26/2013   Altered bowel habits 12/11/2017   Bruises easily    Constipation    Crohn's disease ([place])    Depression    Dizziness and giddiness 01/26/2013   Fecal incontinence alternating with constipation 12/11/2017   GERD (gastroesophageal reflux disease)    Hemianopia    left   Hemiparesis and alteration of sensations as late effects of stroke ([place]) 01/26/2013   Hyperlipidemia    Hypertension    Hypothyroidism    Lump in female breast    Panic disorder    PONV (postoperative nausea and vomiting)    Stroke ([place]) 2009   Tremor, essential 04/24/2020   Type II diabetes mellitus ([place])     PAST SURGICAL HISTORY:   Past Surgical History:  Procedure Laterality Date   ANAL FISSURE REPAIR  2008   APPENDECTOMY  1986   BREAST BIOPSY Left 2006   neg   BREAST LUMPECTOMY Right 1990's   CESAREAN SECTION  1981; [place]  05/1991   TUBAL LIGATION  1983    SOCIAL HISTORY:   Social History   Tobacco Use   Smoking status: Former  Packs/day: 1.00    Years: 20.00    Pack years: 20.00    Types: Cigarettes    Quit date: 04/16/1993    Years since quitting: 27.9   Smokeless tobacco: Never  Substance Use Topics   Alcohol use: Not Currently    Comment: "haven't drank since early 1980's"    FAMILY HISTORY:   Family History  Problem Relation Age of Onset   Heart disease Mother    Heart disease Father    Diabetes Brother     Diabetes Brother    Migraines Son     DRUG ALLERGIES:   Allergies  Allergen Reactions   Codeine Other (See Comments)    Syncope.   Morphine And Related Nausea And Vomiting and Other (See Comments)    Hallucinations.   Baclofen Nausea Only and Other (See Comments)    dizziness   Metformin Hcl Other (See Comments)   Penicillins Other (See Comments)    Family history   Sulfamethoxazole Itching    Hands itching    Elemental Sulfur Itching    Per patient happened years ago.    REVIEW OF SYSTEMS:   ROS As per history of present illness. All pertinent systems were reviewed above. Constitutional, HEENT, cardiovascular, respiratory, GI, GU, musculoskeletal, neuro, psychiatric, endocrine, integumentary and hematologic systems were reviewed and are otherwise negative/unremarkable except for positive findings mentioned above in the HPI.   MEDICATIONS AT HOME:   Prior to Admission medications   Medication Sig Start Date End Date Taking? Authorizing Provider  aspirin 81 MG tablet Take 81 mg by mouth daily.   Yes [provider]  clopidogrel (PLAVIX) 75 MG tablet Take 1 tablet (75 mg total) by mouth daily. Patient taking differently: Take 75 mg by mouth every evening. 11/03/20  Yes Sharen Hones, MD  Dulaglutide 0.75 MG/0.5ML SOPN Inject 0.75 mg into the skin every Monday.   Yes [provider]  insulin degludec (TRESIBA FLEXTOUCH) 100 UNIT/ML FlexTouch Pen Inject 30 Units into the skin in the morning.   Yes [provider]  levothyroxine (SYNTHROID) 137 MCG tablet Take 137 mcg by mouth every morning. 01/31/20  Yes [provider]  pantoprazole (PROTONIX) 40 MG tablet Take 40 mg by mouth in the morning.   Yes [provider]  ramipril (ALTACE) 2.5 MG capsule Take 2.5 mg by mouth every evening.   Yes [provider]  simvastatin (ZOCOR) 20 MG tablet Take 20 mg by mouth every evening.   Yes [provider]      VITAL SIGNS:   Blood pressure 132/74, pulse 88, temperature 98.2 F (36.8 C), temperature source Oral, resp. rate 17, height 5' 4"  (1.626 m), weight 67.1 kg, SpO2 96 %.  PHYSICAL EXAMINATION:  Physical Exam  GENERAL:  71 y.o.-year-old Caucasian female patient lying in the bed with no acute distress.  EYES: Pupils equal, round, reactive to light and accommodation. No scleral icterus. Extraocular muscles intact.  HEENT: Head atraumatic, normocephalic. Oropharynx and nasopharynx clear.  NECK:  Supple, no jugular venous distention. No thyroid enlargement, no tenderness.  LUNGS: Normal breath sounds bilaterally, no wheezing, rales,rhonchi or crepitation. No use of accessory muscles of respiration.  CARDIOVASCULAR: Regular rate and rhythm, S1, S2 normal. No murmurs, rubs, or gallops.  ABDOMEN: Soft, nondistended, nontender. Bowel sounds present. No organomegaly or mass.  EXTREMITIES: No pedal edema, cyanosis, or clubbing.  NEUROLOGIC: Cranial nerves II through XII were grossly intact except for left-sided hemianopsia.  Muscle strength 0/5 in left upper extremity with  left hand contracture and 1-2/5 in the left lower extremity compared to 5/5 in the right upper and lower extremities. . Sensation intact. Gait not checked.  PSYCHIATRIC: The patient is alert and oriented x 3.  Normal affect and good eye contact. SKIN: No obvious rash, lesion, or ulcer.   LABORATORY PANEL:   CBC Recent Labs  Lab 03/07/21 1536  WBC 7.1  HGB 11.5*  HCT 37.7  PLT 238   ------------------------------------------------------------------------------------------------------------------  Chemistries  Recent Labs  Lab 03/07/21 1536  NA 136  K 3.7  CL 102  CO2 27  GLUCOSE 158*  BUN 10  CREATININE 0.76  CALCIUM 9.4  AST 20  ALT 15  ALKPHOS 54  BILITOT 0.4   ------------------------------------------------------------------------------------------------------------------  Cardiac Enzymes No results for input(s):  TROPONINI in the last 168 hours. ------------------------------------------------------------------------------------------------------------------  RADIOLOGY:  CT HEAD WO CONTRAST  Result Date: 03/07/2021 CLINICAL DATA:  Provided history: Stroke, follow-up. Additional history provided: Loss of balance, headache, left eye visual changes and lethargy, symptoms began 2 days ago. History of stroke. EXAM: CT HEAD WITHOUT CONTRAST TECHNIQUE: Contiguous axial images were obtained from the base of the skull through the vertex without intravenous contrast. RADIATION DOSE REDUCTION: This exam was performed according to the departmental dose-optimization program which includes automated exposure control, adjustment of the mA and/or kV according to patient size and/or use of iterative reconstruction technique. COMPARISON:  Brain MRI 11/01/2020. CT angiogram head/neck 11/01/2020. FINDINGS: Brain: Mild generalized cerebral and cerebellar atrophy. Redemonstrated large chronic cortical/subcortical right PCA territory infarct within the right temporal, occipital and parietal lobes as well as right thalamus. Redemonstrated chronic small-vessel infarcts within the bilateral basal ganglia and right thalamus. Advanced patchy and confluent hypoattenuation within the cerebral white matter, nonspecific but compatible with chronic small vessel ischemic disease. There is no acute intracranial hemorrhage. No acute demarcated cortical infarct is identified. No extra-axial fluid collection. No evidence of an intracranial mass. No midline shift. Vascular: No hyperdense vessel.  Atherosclerotic calcifications. Skull: Normal. Negative for fracture or focal lesion. Sinuses/Orbits: Visualized orbits show no acute finding. Minimal mucosal thickening within a right ethmoid air cell. Other: Right mastoid effusion. IMPRESSION: No evidence of acute intracranial abnormality. Stable non-contrast CT appearance of the brain as compared to 11/01/2020.  Large chronic right PCA territory infarct. Advanced chronic small vessel ischemic disease within the cerebral white matter. Small vessel infarcts within the bilateral basal ganglia and right thalamus. Mild generalized parenchymal atrophy. Right mastoid effusion. Electronically Signed   By: Kellie Simmering D.O.   On: 03/07/2021 15:58   MR BRAIN WO CONTRAST  Result Date: 03/07/2021 CLINICAL DATA:  Initial evaluation for acute neuro deficit, stroke suspected. EXAM: MRI HEAD WITHOUT CONTRAST TECHNIQUE: Multiplanar, multiecho pulse sequences of the brain and surrounding structures were obtained without intravenous contrast. COMPARISON:  Prior CT from earlier the same day as well as previous brain MRI from 11/01/2020. FINDINGS: Brain: Generalized age-related cerebral atrophy with moderate chronic microvascular ischemic disease. Remote lacunar infarcts present about the bilateral basal ganglia and right thalamus. Large remote right PCA distribution infarct involving the parasagittal right temporal occipital region. Wallerian degeneration at the right cerebral peduncle/midbrain noted. Small volume acute ischemic infarct involving the left thalamus extending into the ventral left midbrain (series 5, images 25-21). Additional subcentimeter ischemic infarct noted involving the left subinsular white matter (series 5, image 21). No associated hemorrhage or mass effect. No other diffusion abnormality to suggest acute or subacute ischemia. No acute intracranial hemorrhage. No mass lesion, midline shift or mass  effect. No hydrocephalus or extra-axial fluid collection. Pituitary gland suprasellar region normal. Midline structures intact. Vascular: Major intracranial vascular flow voids are maintained. Skull and upper cervical spine: Craniocervical junction within normal limits. Bone marrow signal intensity normal. No scalp soft tissue abnormality. Sinuses/Orbits: Left gaze noted. Globes and orbital soft tissues demonstrate no other  acute finding. Paranasal sinuses are clear. Chronic appearing right mastoid effusion, similar to previous. Other: None. IMPRESSION: 1. Small acute ischemic nonhemorrhagic infarcts involving the left thalamus/midbrain and left subinsular white matter. 2. Large remote right PCA distribution infarct. 3. Underlying age-related cerebral atrophy with moderate chronic microvascular ischemic disease with several additional remote lacunar infarcts involving the bilateral basal ganglia and right thalamus. Electronically Signed   By: Jeannine Boga M.D.   On: 03/07/2021 21:53      IMPRESSION AND PLAN:  Principal Problem:   Acute CVA (cerebrovascular accident) ([place])  1.  Acute CVA with left thalamic and midbrain infarctions with gait imbalance and worsening left-sided hemiparesis. - The patient will be admitted to a medical telemetry bed. - We will follow neurochecks every 4 hours for 24 hours. - We will obtain bilateral carotid Doppler for further assessment as well as 2D echo with bubble study. - We will resume her aspirin and Plavix. - PT/OT and ST consults will be obtained. - Neurology consult will be obtained. - I notified Dr. Theda Sers about the patient.  2.  Essential hypertension. - We will resume Altace with holding permissive parameters.  3.  Dyslipidemia. - We will continue statin therapy and check fasting lipids.  4.  Type 2 diabetes mellitus. - The patient will be placed on supplement coverage with NovoLog and will continue basal coverage.  5.  GERD. - We will continue PPI therapy.   DVT prophylaxis: Lovenox. Code Status: full code. Family Communication:  The plan of care was discussed in details with the patient (and family). I answered all questions. The patient agreed to proceed with the above mentioned plan. Further management will depend upon hospital course. Disposition Plan: Back to previous home environment Consults called: Neurology.  All the records are reviewed and  case discussed with ED provider.  Status is: Inpatient   At the time of the admission, it appears that the appropriate admission status for this patient is inpatient.  This is judged to be reasonable and necessary in order to provide the required intensity of service to ensure the patient's safety given the presenting symptoms, physical exam findings and initial radiographic and laboratory data in the context of comorbid conditions.  The patient requires inpatient status due to high intensity of service, high risk of further deterioration and high frequency of surveillance required.  I certify that at the time of admission, it is my clinical judgment that the patient will require inpatient hospital care extending more than 2 midnights.                            Dispo: The patient is from: Home              Anticipated d/c is to: Home              Patient currently is not medically stable to d/c.              Difficult to place patient: No   Christel Mormon M.D on 03/07/2021 at 11:47 PM  [facility]   From 7 PM-7 AM, contact night-coverage [email or web address]  CC: Primary care physician; Juluis Pitch, MD

## 2021-03-08 ENCOUNTER — Inpatient Hospital Stay
Admit: 2021-03-08 | Discharge: 2021-03-08 | Disposition: A | Discharge status: Home / Self Care | Payer: HMO | Attending: Family Medicine | Admitting: Family Medicine

## 2021-03-08 ENCOUNTER — Inpatient Hospital Stay: Payer: HMO

## 2021-03-08 ENCOUNTER — Other Ambulatory Visit: Payer: HMO

## 2021-03-08 DIAGNOSIS — I63532 Cerebral infarction due to unspecified occlusion or stenosis of left posterior cerebral artery: Secondary | ICD-10-CM

## 2021-03-08 DIAGNOSIS — I69354 Hemiplegia and hemiparesis following cerebral infarction affecting left non-dominant side: Secondary | ICD-10-CM

## 2021-03-08 DIAGNOSIS — E785 Hyperlipidemia, unspecified: Secondary | ICD-10-CM

## 2021-03-08 DIAGNOSIS — H5347 Heteronymous bilateral field defects: Secondary | ICD-10-CM

## 2021-03-08 DIAGNOSIS — I6930 Unspecified sequelae of cerebral infarction: Secondary | ICD-10-CM

## 2021-03-08 LAB — CBG MONITORING, ED
Glucose-Capillary: 151 mg/dL — ABNORMAL HIGH (ref 70–99)
Glucose-Capillary: 142 mg/dL — ABNORMAL HIGH (ref 70–99)

## 2021-03-08 LAB — GLUCOSE, CAPILLARY
Glucose-Capillary: 118 mg/dL — ABNORMAL HIGH (ref 70–99)
Glucose-Capillary: 151 mg/dL — ABNORMAL HIGH (ref 70–99)

## 2021-03-08 LAB — URINALYSIS, ROUTINE W REFLEX MICROSCOPIC
Bilirubin Urine: NEGATIVE
Glucose, UA: NEGATIVE mg/dL
Hgb urine dipstick: NEGATIVE
Ketones, ur: NEGATIVE mg/dL
Leukocytes,Ua: NEGATIVE
Nitrite: NEGATIVE
Protein, ur: NEGATIVE mg/dL
Specific Gravity, Urine: 1.006 (ref 1.005–1.030)
pH: 7 (ref 5.0–8.0)

## 2021-03-08 LAB — CBC
HCT: 36.3 % (ref 36.0–46.0)
Hemoglobin: 11.1 g/dL — ABNORMAL LOW (ref 12.0–15.0)
MCH: 23.6 pg — ABNORMAL LOW (ref 26.0–34.0)
MCHC: 30.6 g/dL (ref 30.0–36.0)
MCV: 77.2 fL — ABNORMAL LOW (ref 80.0–100.0)
Platelets: 234 10*3/uL (ref 150–400)
RBC: 4.7 MIL/uL (ref 3.87–5.11)
RDW: 16.4 % — ABNORMAL HIGH (ref 11.5–15.5)
WBC: 6.2 10*3/uL (ref 4.0–10.5)
nRBC: 0 % (ref 0.0–0.2)

## 2021-03-08 LAB — ECHOCARDIOGRAM LIMITED BUBBLE STUDY
Calc EF: 72.5 %
S' Lateral: 3.51 cm
Single Plane A2C EF: 69.1 %
Single Plane A4C EF: 77.9 %

## 2021-03-08 LAB — LIPID PANEL
Cholesterol: 142 mg/dL (ref 0–200)
HDL: 38 mg/dL — ABNORMAL LOW (ref >40)
LDL Cholesterol: 61 mg/dL (ref 0–99)
Total CHOL/HDL Ratio: 3.7 RATIO
Triglycerides: 215 mg/dL — ABNORMAL HIGH (ref <150)
VLDL: 43 mg/dL — ABNORMAL HIGH (ref 0–40)

## 2021-03-08 LAB — BASIC METABOLIC PANEL
Anion gap: 8 (ref 5–15)
BUN: 11 mg/dL (ref 8–23)
CO2: 24 mmol/L (ref 22–32)
Calcium: 9.1 mg/dL (ref 8.9–10.3)
Chloride: 107 mmol/L (ref 98–111)
Creatinine, Ser: 0.59 mg/dL (ref 0.44–1.00)
GFR, Estimated: >60 mL/min (ref >60)
Glucose, Bld: 119 mg/dL — ABNORMAL HIGH (ref 70–99)
Potassium: 3.7 mmol/L (ref 3.5–5.1)
Sodium: 139 mmol/L (ref 135–145)

## 2021-03-08 LAB — HEMOGLOBIN A1C
Hgb A1c MFr Bld: 7.4 % — ABNORMAL HIGH (ref 4.8–5.6)
Mean Plasma Glucose: 165.68 mg/dL

## 2021-03-08 MED ORDER — INSULIN GLARGINE-YFGN 100 UNIT/ML ~~LOC~~ SOLN
18.0000 [IU] | Freq: Every day | SUBCUTANEOUS | Status: DC
  Administered 2021-03-09: 18 [IU] via SUBCUTANEOUS
  Filled 2021-03-08: qty 0.18

## 2021-03-08 MED ORDER — PERFLUTREN LIPID MICROSPHERE
1.0000 mL | INTRAVENOUS | Status: AC | PRN
  Administered 2021-03-08: 2 mL via INTRAVENOUS
  Filled 2021-03-08: qty 10

## 2021-03-08 MED ORDER — INSULIN ASPART 100 UNIT/ML IJ SOLN: 0.0000 [IU] | Freq: Three times a day (TID) | INTRAMUSCULAR | Status: DC

## 2021-03-08 MED ORDER — GEMFIBROZIL 600 MG PO TABS
600.0000 mg | ORAL_TABLET | Freq: Two times a day (BID) | ORAL | Status: DC
  Administered 2021-03-08 – 2021-03-09 (×2): 600 mg via ORAL
  Filled 2021-03-08 (×3): qty 1

## 2021-03-08 MED ORDER — INSULIN GLARGINE-YFGN 100 UNIT/ML ~~LOC~~ SOLN
20.0000 [IU] | Freq: Every day | SUBCUTANEOUS | Status: DC
Start: 2021-03-08 — End: 2021-03-08

## 2021-03-08 MED ORDER — GADOBUTROL 1 MMOL/ML IV SOLN
7.0000 mL | Freq: Once | INTRAVENOUS | Status: AC | PRN
  Administered 2021-03-08: 7 mL via INTRAVENOUS

## 2021-03-08 NOTE — ED Notes (Signed)
ED TO INPATIENT HANDOFF REPORT  ED Nurse Name and Phone #: Baxter Flattery, RN  S Name/Age/Gender [name] 71 y.o. female Room/Bed: ED36A/ED36A  Code Status   Code Status: Full Code  Home/SNF/Other Home Patient oriented to: self, place, time, and situation Is this baseline? Yes   Triage Complete: Triage complete  Chief Complaint Acute CVA (cerebrovascular accident) [facility]) [I63.9]  Triage Note Pt to ED via [place] from home. Pt reports loss of balance, HA, left eye visual changes and lethargy that started 2 days ago, went away, and came back. Pt with hx of stroke with left sided deficits (2019) several TIAs after that. Pt called PCP stating she thinks she had a TIA and PCP referred her to the ED. Pt currently on Plavix.    Allergies Allergies  Allergen Reactions   Codeine Other (See Comments)    Syncope.   Morphine And Related Nausea And Vomiting and Other (See Comments)    Hallucinations.   Baclofen Nausea Only and Other (See Comments)    dizziness   Metformin Hcl Other (See Comments)   Penicillins Other (See Comments)    Family history   Sulfamethoxazole Itching    Hands itching    Elemental Sulfur Itching    Per patient happened years ago.    Level of Care/Admitting Diagnosis ED Disposition     ED Disposition  Admit   Condition  --   [place]: [place] [[id number]]  Level of Care: Telemetry Medical [104]  Covid Evaluation: Asymptomatic Screening Protocol (No Symptoms)  Diagnosis: Acute CVA (cerebrovascular accident) [facility]) [[id number]]  Admitting Physician: Christel Mormon [[id number]]  Attending Physician: Christel Mormon [[id number]]  Estimated length of stay: past midnight tomorrow  Certification:: I certify this patient will need inpatient services for at least 2 midnights          B Medical/Surgery History Past Medical History:  Diagnosis Date   Abdominal pain 12/06/2013   Abnormality of gait 01/26/2013   Altered bowel habits  12/11/2017   Bruises easily    Constipation    Crohn's disease ([place])    Depression    Dizziness and giddiness 01/26/2013   Fecal incontinence alternating with constipation 12/11/2017   GERD (gastroesophageal reflux disease)    Hemianopia    left   Hemiparesis and alteration of sensations as late effects of stroke ([place]) 01/26/2013   Hyperlipidemia    Hypertension    Hypothyroidism    Lump in female breast    Panic disorder    PONV (postoperative nausea and vomiting)    Stroke ([place]) 2009   Tremor, essential 04/24/2020   Type II diabetes mellitus ([place])    Past Surgical History:  Procedure Laterality Date   ANAL FISSURE REPAIR  2008   APPENDECTOMY  1986   BREAST BIOPSY Left 2006   neg   BREAST LUMPECTOMY Right 1990's   CESAREAN SECTION  1981; [place]  05/1991   TUBAL LIGATION  1983     A IV Location/Drains/Wounds Patient Lines/Drains/Airways Status     Active Line/Drains/Airways     Name Placement date Placement time Site Days   Peripheral IV 03/07/21 20 G Right Antecubital 03/07/21  1916  Antecubital  1            Intake/Output Last 24 hours No intake or output data in the 24 hours ending 03/08/21 1246  Labs/Imaging Results for orders placed or performed during the hospital  encounter of 03/07/21 (from the past 48 hour(s))  Protime-INR     Status: None   Collection Time: 03/07/21  3:36 PM  Result Value Ref Range   Prothrombin Time 13.4 11.4 - 15.2 seconds   INR 1.0 0.8 - 1.2    Comment: (NOTE) INR goal varies based on device and disease states. Performed at [facility], [address] [place], [place] [address]   APTT     Status: None   Collection Time: 03/07/21  3:36 PM  Result Value Ref Range   aPTT 31 24 - 36 seconds    Comment: Performed at [facility], [address] [place], [place] [address]  CBC     Status: Abnormal   Collection Time: 03/07/21  3:36 PM  Result Value Ref Range    WBC 7.1 4.0 - 10.5 K/uL   RBC 4.85 3.87 - 5.11 MIL/uL   Hemoglobin 11.5 (L) 12.0 - 15.0 g/dL   HCT 37.7 36.0 - 46.0 %   MCV 77.7 (L) 80.0 - 100.0 fL   MCH 23.7 (L) 26.0 - 34.0 pg   MCHC 30.5 30.0 - 36.0 g/dL   RDW 16.2 (H) 11.5 - 15.5 %   Platelets 238 150 - 400 K/uL   nRBC 0.0 0.0 - 0.2 %    Comment: Performed at [facility], [address] [place], [place] [address]  Differential     Status: None   Collection Time: 03/07/21  3:36 PM  Result Value Ref Range   Neutrophils Relative % 64 %   Neutro Abs 4.5 1.7 - 7.7 K/uL   Lymphocytes Relative 25 %   Lymphs Abs 1.8 0.7 - 4.0 K/uL   Monocytes Relative 8 %   Monocytes Absolute 0.6 0.1 - 1.0 K/uL   Eosinophils Relative 2 %   Eosinophils Absolute 0.2 0.0 - 0.5 K/uL   Basophils Relative 1 %   Basophils Absolute 0.0 0.0 - 0.1 K/uL   Immature Granulocytes 0 %   Abs Immature Granulocytes 0.03 0.00 - 0.07 K/uL    Comment: Performed at [facility], [address] [place], [place] [address]  Comprehensive metabolic panel     Status: Abnormal   Collection Time: 03/07/21  3:36 PM  Result Value Ref Range   Sodium 136 135 - 145 mmol/L   Potassium 3.7 3.5 - 5.1 mmol/L   Chloride 102 98 - 111 mmol/L   CO2 27 22 - 32 mmol/L   Glucose, Bld 158 (H) 70 - 99 mg/dL    Comment: Glucose reference range applies only to samples taken after fasting for at least 8 hours.   BUN 10 8 - 23 mg/dL   Creatinine, Ser 0.76 0.44 - 1.00 mg/dL   Calcium 9.4 8.9 - 10.3 mg/dL   Total Protein 7.5 6.5 - 8.1 g/dL   Albumin 4.3 3.5 - 5.0 g/dL   AST 20 15 - 41 U/L   ALT 15 0 - 44 U/L   Alkaline Phosphatase 54 38 - 126 U/L   Total Bilirubin 0.4 0.3 - 1.2 mg/dL   GFR, Estimated >60 >60 mL/min    Comment: (NOTE) Calculated using the CKD-EPI Creatinine Equation (2021)    Anion gap 7 5 - 15    Comment: Performed at [facility], [address] [place], [place] [address]  Resp Panel by RT-PCR (Flu A&B, Covid) Nasopharyngeal Swab      Status: None   Collection Time: 03/07/21  7:15 PM   Specimen:  Nasopharyngeal Swab; Nasopharyngeal(NP) swabs in vial transport medium  Result Value Ref Range   SARS Coronavirus 2 by RT PCR NEGATIVE NEGATIVE    Comment: (NOTE) SARS-CoV-2 target nucleic acids are NOT DETECTED.  The SARS-CoV-2 RNA is generally detectable in upper respiratory specimens during the acute phase of infection. The lowest concentration of SARS-CoV-2 viral copies this assay can detect is 138 copies/mL. A negative result does not preclude SARS-Cov-2 infection and should not be used as the sole basis for treatment or other patient management decisions. A negative result may occur with  improper specimen collection/handling, submission of specimen other than nasopharyngeal swab, presence of viral mutation(s) within the areas targeted by this assay, and inadequate number of viral copies(<138 copies/mL). A negative result must be combined with clinical observations, patient history, and epidemiological information. The expected result is Negative.  Fact Sheet for Patients:  EntrepreneurPulse.com.au  Fact Sheet for Healthcare Providers:  IncredibleEmployment.be  This test is no t yet approved or cleared by the [place] FDA and  has been authorized for detection and/or diagnosis of SARS-CoV-2 by FDA under an Emergency Use Authorization (EUA). This EUA will remain  in effect (meaning this test can be used) for the duration of the COVID-19 declaration under Section 564(b)(1) of the Act, 21 U.S.C.section 360bbb-3(b)(1), unless the authorization is terminated  or revoked sooner.       Influenza A by PCR NEGATIVE NEGATIVE   Influenza B by PCR NEGATIVE NEGATIVE    Comment: (NOTE) The Xpert Xpress SARS-CoV-2/FLU/RSV plus assay is intended as an aid in the diagnosis of influenza from Nasopharyngeal swab specimens and should not be used as a sole basis for treatment. Nasal  washings and aspirates are unacceptable for Xpert Xpress SARS-CoV-2/FLU/RSV testing.  Fact Sheet for Patients: EntrepreneurPulse.com.au  Fact Sheet for Healthcare Providers: IncredibleEmployment.be  This test is not yet approved or cleared by the [place] FDA and has been authorized for detection and/or diagnosis of SARS-CoV-2 by FDA under an Emergency Use Authorization (EUA). This EUA will remain in effect (meaning this test can be used) for the duration of the COVID-19 declaration under Section 564(b)(1) of the Act, 21 U.S.C. section 360bbb-3(b)(1), unless the authorization is terminated or revoked.  Performed at [facility], [address] [place], [place] [address]   Lipid panel     Status: Abnormal   Collection Time: 03/08/21  6:32 AM  Result Value Ref Range   Cholesterol 142 0 - 200 mg/dL   Triglycerides 215 (H) <150 mg/dL   HDL 38 (L) >40 mg/dL   Total CHOL/HDL Ratio 3.7 RATIO   VLDL 43 (H) 0 - 40 mg/dL   LDL Cholesterol 61 0 - 99 mg/dL    Comment:        Total Cholesterol/HDL:CHD Risk Coronary Heart Disease Risk Table                     Men   Women  1/2 Average Risk   3.4   3.3  Average Risk       5.0   4.4  2 X Average Risk   9.6   7.1  3 X Average Risk  23.4   11.0        Use the calculated Patient Ratio above and the CHD Risk Table to determine the patient's CHD Risk.        ATP III CLASSIFICATION (LDL):  <100     mg/dL   Optimal  100-129  mg/dL  Near or Above                    Optimal  130-159  mg/dL   Borderline  160-189  mg/dL   High  >190     mg/dL   Very High Performed at [facility], [address] [place], [place] [address]   CBG monitoring, ED     Status: Abnormal   Collection Time: 03/08/21  7:24 AM  Result Value Ref Range   Glucose-Capillary 151 (H) 70 - 99 mg/dL    Comment: Glucose reference range applies only to samples taken after fasting for at least 8 hours.   Comment  1 Notify RN    Comment 2 Document in Chart   CBC     Status: Abnormal   Collection Time: 03/08/21  9:38 AM  Result Value Ref Range   WBC 6.2 4.0 - 10.5 K/uL   RBC 4.70 3.87 - 5.11 MIL/uL   Hemoglobin 11.1 (L) 12.0 - 15.0 g/dL   HCT 36.3 36.0 - 46.0 %   MCV 77.2 (L) 80.0 - 100.0 fL   MCH 23.6 (L) 26.0 - 34.0 pg   MCHC 30.6 30.0 - 36.0 g/dL   RDW 16.4 (H) 11.5 - 15.5 %   Platelets 234 150 - 400 K/uL   nRBC 0.0 0.0 - 0.2 %    Comment: Performed at [facility], [address] [place], [place] [address]  Basic metabolic panel     Status: Abnormal   Collection Time: 03/08/21  9:38 AM  Result Value Ref Range   Sodium 139 135 - 145 mmol/L   Potassium 3.7 3.5 - 5.1 mmol/L   Chloride 107 98 - 111 mmol/L   CO2 24 22 - 32 mmol/L   Glucose, Bld 119 (H) 70 - 99 mg/dL    Comment: Glucose reference range applies only to samples taken after fasting for at least 8 hours.   BUN 11 8 - 23 mg/dL   Creatinine, Ser 0.59 0.44 - 1.00 mg/dL   Calcium 9.1 8.9 - 10.3 mg/dL   GFR, Estimated >60 >60 mL/min    Comment: (NOTE) Calculated using the CKD-EPI Creatinine Equation (2021)    Anion gap 8 5 - 15    Comment: Performed at [facility], [address] [place], [place] [address]  CBG monitoring, ED     Status: Abnormal   Collection Time: 03/08/21 11:42 AM  Result Value Ref Range   Glucose-Capillary 142 (H) 70 - 99 mg/dL    Comment: Glucose reference range applies only to samples taken after fasting for at least 8 hours.   CT HEAD WO CONTRAST  Result Date: 03/07/2021 CLINICAL DATA:  Provided history: Stroke, follow-up. Additional history provided: Loss of balance, headache, left eye visual changes and lethargy, symptoms began 2 days ago. History of stroke. EXAM: CT HEAD WITHOUT CONTRAST TECHNIQUE: Contiguous axial images were obtained from the base of the skull through the vertex without intravenous contrast. RADIATION DOSE REDUCTION: This exam was performed according to the  departmental dose-optimization program which includes automated exposure control, adjustment of the mA and/or kV according to patient size and/or use of iterative reconstruction technique. COMPARISON:  Brain MRI 11/01/2020. CT angiogram head/neck 11/01/2020. FINDINGS: Brain: Mild generalized cerebral and cerebellar atrophy. Redemonstrated large chronic cortical/subcortical right PCA territory infarct within the right temporal, occipital and parietal lobes as well as right thalamus. Redemonstrated chronic small-vessel infarcts within the bilateral basal ganglia and right thalamus. Advanced patchy  and confluent hypoattenuation within the cerebral white matter, nonspecific but compatible with chronic small vessel ischemic disease. There is no acute intracranial hemorrhage. No acute demarcated cortical infarct is identified. No extra-axial fluid collection. No evidence of an intracranial mass. No midline shift. Vascular: No hyperdense vessel.  Atherosclerotic calcifications. Skull: Normal. Negative for fracture or focal lesion. Sinuses/Orbits: Visualized orbits show no acute finding. Minimal mucosal thickening within a right ethmoid air cell. Other: Right mastoid effusion. IMPRESSION: No evidence of acute intracranial abnormality. Stable non-contrast CT appearance of the brain as compared to 11/01/2020. Large chronic right PCA territory infarct. Advanced chronic small vessel ischemic disease within the cerebral white matter. Small vessel infarcts within the bilateral basal ganglia and right thalamus. Mild generalized parenchymal atrophy. Right mastoid effusion. Electronically Signed   By: Kellie Simmering D.O.   On: 03/07/2021 15:58   MR BRAIN WO CONTRAST  Result Date: 03/07/2021 CLINICAL DATA:  Initial evaluation for acute neuro deficit, stroke suspected. EXAM: MRI HEAD WITHOUT CONTRAST TECHNIQUE: Multiplanar, multiecho pulse sequences of the brain and surrounding structures were obtained without intravenous contrast.  COMPARISON:  Prior CT from earlier the same day as well as previous brain MRI from 11/01/2020. FINDINGS: Brain: Generalized age-related cerebral atrophy with moderate chronic microvascular ischemic disease. Remote lacunar infarcts present about the bilateral basal ganglia and right thalamus. Large remote right PCA distribution infarct involving the parasagittal right temporal occipital region. Wallerian degeneration at the right cerebral peduncle/midbrain noted. Small volume acute ischemic infarct involving the left thalamus extending into the ventral left midbrain (series 5, images 25-21). Additional subcentimeter ischemic infarct noted involving the left subinsular white matter (series 5, image 21). No associated hemorrhage or mass effect. No other diffusion abnormality to suggest acute or subacute ischemia. No acute intracranial hemorrhage. No mass lesion, midline shift or mass effect. No hydrocephalus or extra-axial fluid collection. Pituitary gland suprasellar region normal. Midline structures intact. Vascular: Major intracranial vascular flow voids are maintained. Skull and upper cervical spine: Craniocervical junction within normal limits. Bone marrow signal intensity normal. No scalp soft tissue abnormality. Sinuses/Orbits: Left gaze noted. Globes and orbital soft tissues demonstrate no other acute finding. Paranasal sinuses are clear. Chronic appearing right mastoid effusion, similar to previous. Other: None. IMPRESSION: 1. Small acute ischemic nonhemorrhagic infarcts involving the left thalamus/midbrain and left subinsular white matter. 2. Large remote right PCA distribution infarct. 3. Underlying age-related cerebral atrophy with moderate chronic microvascular ischemic disease with several additional remote lacunar infarcts involving the bilateral basal ganglia and right thalamus. Electronically Signed   By: Jeannine Boga M.D.   On: 03/07/2021 21:53   US Carotid Bilateral  Result Date:  03/08/2021 CLINICAL DATA:  Initial evaluation for acute CVA. EXAM: BILATERAL CAROTID DUPLEX ULTRASOUND TECHNIQUE: Pearline Cables scale imaging, color Doppler and duplex ultrasound were performed of bilateral carotid and vertebral arteries in the neck. COMPARISON:  Prior MRI from 03/07/2021. FINDINGS: Criteria: Quantification of carotid stenosis is based on velocity parameters that correlate the residual internal carotid diameter with NASCET-based stenosis levels, using the diameter of the distal internal carotid lumen as the denominator for stenosis measurement. The following velocity measurements were obtained: RIGHT ICA: 83/26 cm/sec CCA: 37/90 cm/sec SYSTOLIC ICA/CCA RATIO:  1.5 ECA: 66 cm/sec LEFT ICA: 76/25 cm/sec CCA: 24/09 cm/sec SYSTOLIC ICA/CCA RATIO:  1.2 ECA: 61 cm/sec RIGHT CAROTID ARTERY: Minimal intimal thickening seen within the visualized right CCA without significant stenosis. Mild intimal thickening extends to involve the right carotid bulb and proximal right ICA. No significant echogenic plaque or  narrowing. Visualized right ICA widely patent distally. RIGHT VERTEBRAL ARTERY:  Antegrade. LEFT CAROTID ARTERY: Mild intimal thickening seen within the visualized left CCA without significant stenosis. Small volume echogenic plaque present within the left carotid bulb. No elevation in peak systolic velocity to suggest hemodynamically significant stenosis. Visualized left ICA widely patent distally. LEFT VERTEBRAL ARTERY:  Antegrade. IMPRESSION: 1. Mild for age intimal thickening and plaque involving the left greater than right carotid bulbs, but with no sonographic evidence for hemodynamically significant stenosis. 2. Patent vertebral arteries with antegrade flow. Electronically Signed   By: Jeannine Boga M.D.   On: 03/08/2021 02:47    Pending Labs Unresulted Labs (From admission, onward)     Start     Ordered   03/14/21 0500  Creatinine, serum  (enoxaparin (LOVENOX)    CrCl >/= 30 ml/min)  Weekly,    STAT     Comments: while on enoxaparin therapy    03/07/21 2233   03/08/21 0946  Hemoglobin A1c  Once,   STAT       Comments: To assess prior glycemic control    03/08/21 0946   03/08/21 0500  Hemoglobin A1c  (Labs)  Tomorrow morning,   STAT        03/07/21 2233   03/07/21 1803  Urinalysis, Routine w reflex microscopic Urine, Clean Catch  Once,   STAT        03/07/21 1802            Vitals/Pain Today's Vitals   03/08/21 0715 03/08/21 0730 03/08/21 0745 03/08/21 0800  BP:  137/82  (!) 144/76  Pulse: 66 78 72 69  Resp: 15 17 (!) 21 11  Temp:      TempSrc:      SpO2: 97% 97% 94% 100%  Weight:      Height:      PainSc:        Isolation Precautions No active isolations  Medications Medications   stroke: mapping our early stages of recovery book ( Does not apply Not Given 03/07/21 2259)  0.9 %  sodium chloride infusion ( Intravenous New Bag/Given 03/08/21 0811)  acetaminophen (TYLENOL) tablet 650 mg (has no administration in time range)    Or  acetaminophen (TYLENOL) 160 MG/5ML solution 650 mg (has no administration in time range)    Or  acetaminophen (TYLENOL) suppository 650 mg (has no administration in time range)  senna-docusate (Senokot-S) tablet 1 tablet (has no administration in time range)  enoxaparin (LOVENOX) injection 40 mg (40 mg Subcutaneous Given 03/08/21 0734)  magnesium hydroxide (MILK OF MAGNESIA) suspension 30 mL (has no administration in time range)  traZODone (DESYREL) tablet 25 mg (has no administration in time range)  aspirin EC tablet 81 mg (81 mg Oral Given 03/08/21 0733)  ramipril (ALTACE) capsule 2.5 mg (has no administration in time range)  simvastatin (ZOCOR) tablet 20 mg (has no administration in time range)  Dulaglutide SOPN 0.75 mg (has no administration in time range)  levothyroxine (SYNTHROID) tablet 137 mcg (137 mcg Oral Given 03/08/21 0541)  pantoprazole (PROTONIX) EC tablet 40 mg (40 mg Oral Given 03/08/21 0733)  clopidogrel (PLAVIX)  tablet 75 mg (has no administration in time range)  insulin aspart (novoLOG) injection 0-6 Units (0 Units Subcutaneous Not Given 03/08/21 1144)  insulin glargine-yfgn (SEMGLEE) injection 18 Units (has no administration in time range)  aspirin chewable tablet 324 mg (324 mg Oral Given 03/07/21 2255)    Mobility Ambulates with Cane Low fall risk   Focused Assessments  Neuro Assessment Handoff:  Swallow screen pass? Yes    NIH Stroke Scale ( + Modified Stroke Scale Criteria)  Interval: Initial Level of Consciousness (1a.)   : Alert, keenly responsive LOC Questions (1b. )   +: Answers both questions correctly LOC Commands (1c. )   + : Performs both tasks correctly Best Gaze (2. )  +: Normal Visual (3. )  +: No visual loss Facial Palsy (4. )    : Minor paralysis (from previous) Motor Arm, Left (5a. )   +: No movement (from previous) Motor Arm, Right (5b. )   +: No drift Motor Leg, Left (6a. )   +: No drift Motor Leg, Right (6b. )   +: No drift Limb Ataxia (7. ): Absent Sensory (8. )   +: Mild-to-moderate sensory loss, patient feels pinprick is less sharp or is dull on the affected side, or there is a loss of superficial pain with pinprick, but patient is aware of being touched Best Language (9. )   +: No aphasia Dysarthria (10. ): Normal Extinction/Inattention (11.)   +: No Abnormality Modified SS Total  +: 5 Complete NIHSS TOTAL: 6     Neuro Assessment: Exceptions to WDL Neuro Checks:   Initial (03/07/21 2330)  Last Documented NIHSS Modified Score: 5 (03/08/21 0739) Has TPA been given? No If patient is a Neuro Trauma and patient is going to OR before floor call report to [place] nurse: [phone] or [phone]   R Recommendations: See Admitting Provider Note  Report given to:   Additional Notes:

## 2021-03-08 NOTE — Progress Notes (Signed)
Patient up to unit, stable. No needs at this time

## 2021-03-08 NOTE — Progress Notes (Signed)
Chaplain saw PT at 2pm, Patient was on phone and asked me to come back. Chaplain returned 3pm and stayed half an hour. PT requested ACD to look over with her husband and Chaplain got one. PT will take her time to look over ACD with spouse.

## 2021-03-08 NOTE — Evaluation (Signed)
Physical Therapy Evaluation Patient Details Name: [name] MRN: [id number] DOB: 11/19/1950 Today's Date: 03/08/2021  History of Present Illness  Pt is a 71 y.o. female with medical history significant for CVA with residual left-sided hemiparesis and left hand contracture, hypertension, dyslipidemia, hypothyroidism, depression, Crohn's disease, GERD, left-sided hemianopsia, type 2 diabetes mellitus and tremors, who presented to the ER with acute onset of worsening left-sided hemiparesis. Pt diagnosed with acute CVA with left thalamic and midbrain infarctions with gait imbalance and worsening left-sided hemiparesis.   Clinical Impression  Pt was pleasant and motivated to participate during the session and put forth good effort throughout. Pt required minA with bed mobility tasks and close CGA with transfers and gait. Pt presented with min instability in standing but was able to self-correct with only close CGA provided.  Pt reported noted improvement in her strength and ability to ambulate compared to when her symptoms were at their most profound but still reported feeling only "50%" back to her baseline.  Pt has 24/7 assistance with ramp entrance to her home as well as a w/c and would be safe to return home with CGA from spouse during ambulation. Pt would benefit from HHPT upon discharge to safely address deficits listed in patient problem list for decreased caregiver assistance and eventual return to PLOF.         Recommendations for follow up therapy are one component of a multi-disciplinary discharge planning process, led by the attending physician.  Recommendations may be updated based on patient status, additional functional criteria and insurance authorization.  Follow Up Recommendations Home health PT    Assistance Recommended at Discharge Frequent or constant Supervision/Assistance  Patient can return home with the following  A little help with walking and/or transfers;A little help  with bathing/dressing/bathroom;Assistance with cooking/housework;Assist for transportation;Help with stairs or ramp for entrance    Equipment Recommendations None recommended by PT  Recommendations for Other Services       Functional Status Assessment Patient has had a recent decline in their functional status and demonstrates the ability to make significant improvements in function in a reasonable and predictable amount of time.     Precautions / Restrictions Precautions Precautions: Fall Restrictions Weight Bearing Restrictions: No      Mobility  Bed Mobility Overal bed mobility: Needs Assistance Bed Mobility: Supine to Sit;Sit to Supine     Supine to sit: Min assist Sit to supine: Min assist   General bed mobility comments: Min A for trunk and LLE control    Transfers Overall transfer level: Needs assistance Equipment used: Straight cane Transfers: Sit to/from Stand Sit to Stand: Min guard           General transfer comment: Fair eccetric and concentric control and stability    Ambulation/Gait Ambulation/Gait assistance: Counsellor (Feet): 40 Feet Assistive device: Straight cane Gait Pattern/deviations: Step-to pattern;Decreased stance time - left Gait velocity: decreased     General Gait Details: Close CGA with pt able to amb with slow, cautious cadence and step-to pattern with min instability at times that the pt was able to self-correct  Stairs            Wheelchair Mobility    Modified Rankin (Stroke Patients Only)       Balance Overall balance assessment: Needs assistance Sitting-balance support: No upper extremity supported;Feet supported Sitting balance-Leahy Scale: Good     Standing balance support: Single extremity supported;During functional activity Standing balance-Leahy Scale: Poor Standing balance comment: Requires SPC for  stability                             Pertinent Vitals/Pain Pain Assessment:  No/denies pain    Home Living Family/patient expects to be discharged to:: Private residence Living Arrangements: Spouse/significant other Available Help at Discharge: Family;Available 24 hours/day Type of Home: House Home Access: Ramped entrance       Home Layout: One level Home Equipment: Rollator (4 wheels);Cane - single point;Grab bars - tub/shower;Shower seat;Wheelchair - Press photographer      Prior Function Prior Level of Function : Independent/Modified Independent             Mobility Comments: Mod Ind amb limited community distances with either a rollator or SPC; pt reported primarily using rollator in the home using her RUE only to give her access to carry things on the seat, no fall history, occasionally able to amb limited distances in the home without AD  ADLs Comments: Ind with ADLs     Hand Dominance   Dominant Hand: Right    Extremity/Trunk Assessment   Upper Extremity Assessment Upper Extremity Assessment: Defer to OT evaluation RUE Deficits / Details: WFL LUE Deficits / Details: baseline hemiplegic    Lower Extremity Assessment Lower Extremity Assessment: Generalized weakness;LLE deficits/detail LLE Deficits / Details: Chronic hemiplegia with pt reporting feeling that LLE is returning to baseline but still somewhat weaker than normal for her       Communication   Communication: No difficulties  Cognition Arousal/Alertness: Awake/alert Behavior During Therapy: WFL for tasks assessed/performed Overall Cognitive Status: Within Functional Limits for tasks assessed                                          General Comments      Exercises Other Exercises Other Exercises: Marching in place and step forward/backward at EOB   Assessment/Plan    PT Assessment Patient needs continued PT services  PT Problem List Decreased strength;Decreased activity tolerance;Decreased balance;Decreased mobility       PT Treatment  Interventions DME instruction;Gait training;Functional mobility training;Therapeutic activities;Therapeutic exercise;Balance training;Patient/family education    PT Goals (Current goals can be found in the Care Plan section)  Acute Rehab PT Goals Patient Stated Goal: To walk better PT Goal Formulation: With patient Time For Goal Achievement: 03/21/21 Potential to Achieve Goals: Good    Frequency 7X/week     Co-evaluation               AM-PAC PT "6 Clicks" Mobility  Outcome Measure Help needed turning from your back to your side while in a flat bed without using bedrails?: A Little Help needed moving from lying on your back to sitting on the side of a flat bed without using bedrails?: A Little Help needed moving to and from a bed to a chair (including a wheelchair)?: A Little Help needed standing up from a chair using your arms (e.g., wheelchair or bedside chair)?: A Little Help needed to walk in hospital room?: A Little Help needed climbing 3-5 steps with a railing? : A Lot 6 Click Score: 17    End of Session Equipment Utilized During Treatment: Gait belt Activity Tolerance: Patient tolerated treatment well Patient left: in bed;with call bell/phone within reach;with bed alarm set Nurse Communication: Mobility status PT Visit Diagnosis: Unsteadiness on feet (R26.81);Muscle weakness (generalized) (M62.81);Difficulty in  walking, not elsewhere classified (R26.2);Hemiplegia and hemiparesis Hemiplegia - Right/Left: Left Hemiplegia - dominant/non-dominant: Non-dominant Hemiplegia - caused by: Cerebral infarction    Time: [phone] (and 9:16-9:41 with break for MD visit) PT Time Calculation (min) (ACUTE ONLY): 11 min   Charges:   PT Evaluation $PT Eval Moderate Complexity: 1 Mod PT Treatments $Gait Training: 8-22 mins       D. Royetta Asal PT, DPT 03/08/21, 12:05 PM

## 2021-03-08 NOTE — Consult Note (Addendum)
Neurology Stroke Consult H&P  [name] MR# [id number] 03/08/2021  CC: acute ischemic stroke  History is obtained from: patient and chart.  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 This morning, she found it difficult to maintain her balance and her PCP advised her to proceed to ED.  There is no hearing changes, headache or dizziness.  LKW: unclear tNK given: No OSW IR Thrombectomy No, not indicated Modified Rankin Scale: 1-No significant post stroke disability and can perform usual duties with stroke symptoms NIHSS: 5 Chronic  ROS: A complete ROS was performed and is negative except as noted in the HPI.   Past Medical History:  Diagnosis Date   Abdominal pain 12/06/2013   Abnormality of gait 01/26/2013   Altered bowel habits 12/11/2017   Bruises easily    Constipation    Crohn's disease ([place])    Depression    Dizziness and giddiness 01/26/2013   Fecal incontinence alternating with constipation 12/11/2017   GERD (gastroesophageal reflux disease)    Hemianopia    left   Hemiparesis and alteration of sensations as late effects of stroke ([place]) 01/26/2013   Hyperlipidemia    Hypertension    Hypothyroidism    Lump in female breast    Panic disorder    PONV (postoperative nausea and vomiting)    Stroke [facility]) 2009   Tremor, essential 04/24/2020   Type II diabetes mellitus ([place])      Family History  Problem Relation Age of Onset   Heart disease Mother    Heart disease Father    Diabetes Brother    Diabetes Brother    Migraines Son     Social History:  reports that she quit smoking about 27 years ago. Her smoking use included cigarettes. She has a 20.00 pack-year smoking history. She has never used smokeless tobacco. She reports that she does not currently use alcohol. She  reports that she does not use drugs.   Prior to Admission medications   Medication Sig Start Date End Date Taking? Authorizing Provider  aspirin 81 MG tablet Take 81 mg by mouth daily.   Yes [provider]  clopidogrel (PLAVIX) 75 MG tablet Take 1 tablet (75 mg total) by mouth daily. Patient taking differently: Take 75 mg by mouth every evening. 11/03/20  Yes Sharen Hones, MD  Dulaglutide 0.75 MG/0.5ML SOPN Inject 0.75 mg into the skin every Monday.   Yes [provider]  insulin degludec (TRESIBA FLEXTOUCH) 100 UNIT/ML FlexTouch Pen Inject 30 Units into the skin in the morning.   Yes [provider]  levothyroxine (SYNTHROID) 137 MCG tablet Take 137 mcg by mouth every morning. 01/31/20  Yes [provider]  pantoprazole (PROTONIX) 40 MG tablet Take 40 mg by mouth in the morning.   Yes [provider]  ramipril (ALTACE) 2.5 MG capsule Take 2.5 mg by mouth every evening.   Yes [provider]  simvastatin (ZOCOR) 20 MG tablet Take 20 mg by mouth every evening.   Yes [provider]   Exam: Current vital signs: BP (!) 144/76    Pulse 69    Temp 97.8 F (36.6 C)    Resp 11    Ht 5\' 4"  (1.626 m)    Wt 67.1 kg    SpO2 100%  BMI 25.40 kg/m   Physical Exam  Constitutional: Appears well-developed and well-nourished.  Psych: Affect appropriate to situation Eyes: No scleral injection HENT: No OP obstruction. Head: Normocephalic.  Cardiovascular: Normal rate and regular rhythm.  Respiratory: Effort normal, symmetric excursions bilaterally, no audible wheezing. GI: Soft.  No distension. There is no tenderness.  Skin: WDI  Neuro: Mental Status: Patient is awake, alert, oriented to person, place, month, year, and situation. Patient is able to give a clear and coherent history. Speech is fluent, intact comprehension and repetition. No signs of aphasia or neglect. Visual Fields left hemianopia. Pupils are equal, round, and reactive  to light. EOMI without ptosis or diploplia.  Facial sensation is symmetric to temperature Facial movement is symmetric.  Hearing is intact to voice. Uvula midline and palate elevates symmetrically. Shoulder shrug is symmetric. Tongue is midline without atrophy or fasciculations.  Tone is increased in left upper and lower extremities. Bulk is decreased LUE. Left upper extremity contracted ~3/4. LLE ~3+/5. Left grip strength  Sensation is symmetric to light touch and temperature in the arms and legs. Deep Tendon Reflexes: 3+ LEU and LLE. Babinski (+) L FNF and HKS unable to perform on left. Gait - Deferred  I have reviewed labs in epic and the pertinent results are: LDL 61 A1c 7.4  I have reviewed the images obtained: MRI brain showed small acute ischemic nonhemorrhagic infarcts involving the left thalamus/midbrain and left subinsular white matter. Large remote right PCA distribution infarct. Underlying age-related cerebral atrophy with moderate chronic microvascular ischemic disease with several additional remote lacunar infarcts involving the bilateral basal ganglia and right thalamus.  Assessment: [name] is a 71 y.o. female PMHx right PCA stroke with residual hemiparesis and hemianopia with small acute embolic strokes in left PCA territory.   Impression:  Small acute left PCA territory strokes. Chronic right PCA territory strokes. Left spastic hemiparesis residual from prior stroke. NIHSS 5  Plan: - Recommend vascular imaging with MRA head and neck. - Recommend TTE. - A1c is >7 and not at goal. - Continue Statin unchanged LDL is at goal. - Aspirin 81mg  daily. - Clopidogrel 75mg  daily for 3 weeks. - SBP goal <140. - Telemetry monitoring for arrhythmia. - Recommend bedside Swallow screen. - Recommend Stroke education. - Recommend PT/OT/SLP consult.   This patient is critically ill and at significant risk of neurological worsening, death and care requires constant  monitoring of vital signs, hemodynamics,respiratory and cardiac monitoring, neurological assessment, discussion with family, other specialists and medical decision making of high complexity. I spent 71 minutes of neurocritical care time  in the care of  this patient. This was time spent independent of any time provided by nurse practitioner or PA.  Electronically signed by:  Lynnae Sandhoff, MD Page: [phone] 03/08/2021, 10:32 AM  If 7pm- 7am, please page neurology on call as listed in [place].

## 2021-03-08 NOTE — Progress Notes (Signed)
*  PRELIMINARY RESULTS* Echocardiogram 2D Echocardiogram has been performed.  [name] 03/08/2021, 11:23 AM

## 2021-03-08 NOTE — Progress Notes (Signed)
SLP Cancellation Note  Patient Details Name: [name] MRN: [id number] DOB: 1950/06/06   Cancelled treatment:       Reason Eval/Treat Not Completed: SLP screened, no needs identified, will sign off (chart reviewed; consulted NSG then met w/ pt).  Pt denied any difficulty swallowing and is currently on a regular diet; tolerates swallowing pills w/ water per NSG. Pt conversed in general conversation w/out overt expressive/receptive deficits noted; pt denied any speech-language deficits. Speech clear. No further skilled ST services indicated as pt appears at her baseline. Pt agreed. NSG to reconsult if any change in status while admitted.       [name], MS, CCC-SLP Speech Language Pathologist Rehab Services [phone] [facility] 03/08/2021, 1:51 PM

## 2021-03-08 NOTE — Evaluation (Signed)
Occupational Therapy Evaluation Patient Details Name: [name] MRN: [id number] DOB: 09-08-1950 Today's Date: 03/08/2021   History of Present Illness Pt is a 71 y.o. female with medical history significant for CVA with residual left-sided hemiparesis and left hand contracture, hypertension, dyslipidemia, hypothyroidism, depression, Crohn's disease, GERD, left-sided hemianopsia, type 2 diabetes mellitus and tremors, who presented to the ER with acute onset of worsening left-sided hemiparesis. Pt diagnosed with acute CVA with left thalamic and midbrain infarctions with gait imbalance and worsening left-sided hemiparesis.   Clinical Impression   [name] was seen for OT evaluation this date. Prior to hospital admission, pt was MOD I for mobility and ADLs. Pt lives with spouse available 24/7. Pt presents to acute OT demonstrating impaired ADL performance and functional mobility 2/2 decreased activity tolerance and functional strength/balance deficits. Pt currently requires cane + CGA for ADL t/f. MOD I don/doff R shoe seated EOC - anticipate increased assist for L AFO. MIN A sup<>sit. Pt would benefit from skilled OT to address noted impairments and functional limitations (see below for any additional details). Upon hospital discharge, recommend HHOT to maximize pt safety and return to PLOF.       Recommendations for follow up therapy are one component of a multi-disciplinary discharge planning process, led by the attending physician.  Recommendations may be updated based on patient status, additional functional criteria and insurance authorization.   Follow Up Recommendations  Home health OT    Assistance Recommended at Discharge Frequent or constant Supervision/Assistance  Patient can return home with the following A little help with walking and/or transfers;A little help with bathing/dressing/bathroom;Help with stairs or ramp for entrance    Functional Status Assessment  Patient has had a  recent decline in their functional status and demonstrates the ability to make significant improvements in function in a reasonable and predictable amount of time.  Equipment Recommendations  BSC/3in1    Recommendations for Other Services       Precautions / Restrictions Precautions Precautions: Fall Restrictions Weight Bearing Restrictions: No      Mobility Bed Mobility Overal bed mobility: Needs Assistance Bed Mobility: Supine to Sit;Sit to Supine     Supine to sit: Min assist Sit to supine: Min assist        Transfers Overall transfer level: Needs assistance Equipment used: Quad cane Transfers: Sit to/from Stand Sit to Stand: Min guard           General transfer comment: 2 attempts to rise from chair height, good awareness of balance deficits      Balance Overall balance assessment: Needs assistance Sitting-balance support: No upper extremity supported;Feet supported Sitting balance-Leahy Scale: Good     Standing balance support: Single extremity supported;During functional activity Standing balance-Leahy Scale: Poor                             ADL either performed or assessed with clinical judgement   ADL Overall ADL's : Needs assistance/impaired                                       General ADL Comments: MOD I don/doff R shoe seated EOC - anticipate increased assist for L AFO. CGA + cane for ADL t/f      Pertinent Vitals/Pain Pain Assessment: No/denies pain     Hand Dominance Right   Extremity/Trunk Assessment Upper Extremity Assessment  Upper Extremity Assessment: RUE deficits/detail;LUE deficits/detail RUE Deficits / Details: WFL LUE Deficits / Details: baseline hemiplegic   Lower Extremity Assessment Lower Extremity Assessment: Defer to PT evaluation       Communication Communication Communication: No difficulties   Cognition Arousal/Alertness: Awake/alert Behavior During Therapy: WFL for tasks  assessed/performed Overall Cognitive Status: Within Functional Limits for tasks assessed                                        Home Living Family/patient expects to be discharged to:: Private residence Living Arrangements: Spouse/significant other Available Help at Discharge: Family;Available 24 hours/day Type of Home: House Home Access: Ramped entrance     Home Layout: One level     Bathroom Shower/Tub: Occupational psychologist: Handicapped height     Home Equipment: Rollator (4 wheels);Cane - single point;Grab bars - tub/shower;Shower seat          Prior Functioning/Environment Prior Level of Function : Independent/Modified Independent             Mobility Comments: Mod Ind amb limited community distances with either a rollator or SPC, pt reported primarily using rollator in the home using her RUE only, no fall history ADLs Comments: Ind with ADLs        OT Problem List: Decreased strength;Decreased activity tolerance;Impaired balance (sitting and/or standing)      OT Treatment/Interventions: Self-care/ADL training;Therapeutic exercise;Energy conservation;Therapeutic activities;Patient/family education;Balance training;DME and/or AE instruction    OT Goals(Current goals can be found in the care plan section) Acute Rehab OT Goals Patient Stated Goal: to go home OT Goal Formulation: With patient Time For Goal Achievement: 03/22/21 Potential to Achieve Goals: Good ADL Goals Pt Will Perform Grooming: with modified independence;standing Pt Will Perform Lower Body Dressing: with modified independence;sit to/from stand Pt Will Transfer to Toilet: with modified independence;ambulating;regular height toilet  OT Frequency: Min 2X/week    Co-evaluation              AM-PAC OT "6 Clicks" Daily Activity     Outcome Measure Help from another person eating meals?: A Little Help from another person taking care of personal grooming?: A  Little Help from another person toileting, which includes using toliet, bedpan, or urinal?: A Little Help from another person bathing (including washing, rinsing, drying)?: A Little Help from another person to put on and taking off regular upper body clothing?: A Little Help from another person to put on and taking off regular lower body clothing?: A Little 6 Click Score: 18   End of Session Nurse Communication: Mobility status  Activity Tolerance: Patient tolerated treatment well Patient left: in bed;with call bell/phone within reach  OT Visit Diagnosis: Unsteadiness on feet (R26.81)                Time: 4585-9292 OT Time Calculation (min): 12 min Charges:  OT General Charges $OT Visit: 1 Visit OT Evaluation $OT Eval Low Complexity: 1 Low  Dessie Coma, M.S. OTR/L  03/08/21, 11:10 AM  ascom [phone]

## 2021-03-08 NOTE — ED Notes (Signed)
Informed Rn bed assigned  

## 2021-03-08 NOTE — Progress Notes (Signed)
PROGRESS NOTE    [name]  YFV:[id number] DOB: 09/18/1950 DOA: 03/07/2021 PCP: Juluis Pitch, MD   Brief Narrative: 71 year old with past medical history significant for CVA with residual left-sided hemiparesis and left hand contracture, hypertension, dyslipidemia, hypothyroidism, depression, Crohn's disease, GERD, left-sided hemianopsia, diabetes type 2, who presents to the ED with  worsening left-sided hemiparesis over the last couple of days as well as gait abnormality.  She report her balance has been off and he will field  vision is a smaller.  CT head: Showed chronic right PCA territory infarct advanced chronic small vessel ischemic disease and cerebral white matter and a small vessel disease infarct within both basilar ganglia and right thalamus.  MRI of the brain showed a small acute ischemic nonhemorrhagic infarct involving the left thalamus/midbrain/and left subinsular white matter.  Patient admitted for further stroke evaluation.   Assessment & Plan:   Principal Problem:   Acute CVA (cerebrovascular accident) ([place]) Active Problems:   Unsteady gait   Diabetes mellitus ([place])   Hypothyroidism   Spastic hemiplegia affecting nondominant side (HCC)   Hypercholesteremia   Hypertension  1-Acute Stroke: Acute a small ischemic nonhemorrhagic infarct involving the left thalamus/midbrain and left subinsular white matter. -MRI of the brain: Small acute ischemic nonhemorrhagic infarct involving the left thalamus/midbrain and left subinsular white matter.  Large remote right PCA distribution infarct.  Age-related cerebral atrophy with moderate chronic microvascular ischemic disease with several additional remote lacunar infarct involving the bilateral basal ganglia and right thalamus. -LDL: 61 triglyceride 215 -Continue with statins, will add Lopid. -Patient on aspirin and Plavix, she was taking her medications at home.  We will follow neurology recommendation in regards  antiplatelet therapy. -OT OT, speech consulted -2D echo ordered -Permissive  hypertension -A1c:  -Neurology consulted. MRA neck and head ordered.   Hypertension: -Continue with Altace  Dyslipidemia:  -Continue with the statins, will add Lopid to help with hypertriglyceridemia  Diabetes type 2: -Continue with dulaglutide -Continue with sliding scale insulin will reduce sensitive scale -Continue with Semglee but will reduce the dose to 18 units, patient will be in the hospital, and will probably have reduced oral intake.  GERD: Continue with PPI  Hypothyroidism: Continue with Synthroid   Estimated body mass index is 25.4 kg/m as calculated from the following:   Height as of this encounter: 5' 4"  (1.626 m).   Weight as of this encounter: 67.1 kg.   DVT prophylaxis: Lovenox Code Status: Full code Family Communication: Care discussed with patient Disposition Plan:  Status is: Inpatient  Remains inpatient appropriate because: Patient admitted with acute stroke, undergoing work-up, awaiting neurology recommendation.`        Consultants:  Neurology   Procedures:  ECHO  Antimicrobials:    Subjective: She is feeling ok. She report having balance problems, her balance was off. Her visual field is reduced.   Objective: Vitals:   03/08/21 0745 03/08/21 0800 03/08/21 1245 03/08/21 1340  BP:  (!) 144/76 138/75 127/75  Pulse: 72 69 80 77  Resp: (!) 21 11 (!) 21 16  Temp:   97.9 F (36.6 C) 98.4 F (36.9 C)  TempSrc:    Oral  SpO2: 94% 100% 98% 98%  Weight:      Height:       No intake or output data in the 24 hours ending 03/08/21 1609 Filed Weights   03/07/21 1529  Weight: 67.1 kg    Examination:  General exam: Appears calm and comfortable  Respiratory system: Clear to auscultation.  Respiratory effort normal. Cardiovascular system: S1 & S2 heard, RRR. No JVD, murmurs, rubs, gallops or clicks. No pedal edema. Gastrointestinal system: Abdomen is  nondistended, soft and nontender. No organomegaly or masses felt. Normal bowel sounds heard. Central nervous system: Alert and oriented. Left Arm contracture, weakness.  Extremities: Symmetric 5 x 5 power.   Data Reviewed: I have personally reviewed following labs and imaging studies  CBC: Recent Labs  Lab 03/07/21 1536 03/08/21 0938  WBC 7.1 6.2  NEUTROABS 4.5  --   HGB 11.5* 11.1*  HCT 37.7 36.3  MCV 77.7* 77.2*  PLT 238 448   Basic Metabolic Panel: Recent Labs  Lab 03/07/21 1536 03/08/21 0938  NA 136 139  K 3.7 3.7  CL 102 107  CO2 27 24  GLUCOSE 158* 119*  BUN 10 11  CREATININE 0.76 0.59  CALCIUM 9.4 9.1   GFR: Estimated Creatinine Clearance: 62.6 mL/min (by C-G formula based on SCr of 0.59 mg/dL). Liver Function Tests: Recent Labs  Lab 03/07/21 1536  AST 20  ALT 15  ALKPHOS 54  BILITOT 0.4  PROT 7.5  ALBUMIN 4.3   No results for input(s): LIPASE, AMYLASE in the last 168 hours. No results for input(s): AMMONIA in the last 168 hours. Coagulation Profile: Recent Labs  Lab 03/07/21 1536  INR 1.0   Cardiac Enzymes: No results for input(s): CKTOTAL, CKMB, CKMBINDEX, TROPONINI in the last 168 hours. BNP (last 3 results) No results for input(s): PROBNP in the last 8760 hours. HbA1C: Recent Labs    03/08/21 0632  HGBA1C 7.4*   CBG: Recent Labs  Lab 03/08/21 0724 03/08/21 1142  GLUCAP 151* 142*   Lipid Profile: Recent Labs    03/08/21 0632  CHOL 142  HDL 38*  LDLCALC 61  TRIG 215*  CHOLHDL 3.7   Thyroid Function Tests: No results for input(s): TSH, T4TOTAL, FREET4, T3FREE, THYROIDAB in the last 72 hours. Anemia Panel: No results for input(s): VITAMINB12, FOLATE, FERRITIN, TIBC, IRON, RETICCTPCT in the last 72 hours. Sepsis Labs: No results for input(s): PROCALCITON, LATICACIDVEN in the last 168 hours.  Recent Results (from the past 240 hour(s))  Resp Panel by RT-PCR (Flu A&B, Covid) Nasopharyngeal Swab     Status: None   Collection  Time: 03/07/21  7:15 PM   Specimen: Nasopharyngeal Swab; Nasopharyngeal(NP) swabs in vial transport medium  Result Value Ref Range Status   SARS Coronavirus 2 by RT PCR NEGATIVE NEGATIVE Final    Comment: (NOTE) SARS-CoV-2 target nucleic acids are NOT DETECTED.  The SARS-CoV-2 RNA is generally detectable in upper respiratory specimens during the acute phase of infection. The lowest concentration of SARS-CoV-2 viral copies this assay can detect is 138 copies/mL. A negative result does not preclude SARS-Cov-2 infection and should not be used as the sole basis for treatment or other patient management decisions. A negative result may occur with  improper specimen collection/handling, submission of specimen other than nasopharyngeal swab, presence of viral mutation(s) within the areas targeted by this assay, and inadequate number of viral copies(<138 copies/mL). A negative result must be combined with clinical observations, patient history, and epidemiological information. The expected result is Negative.  Fact Sheet for Patients:  EntrepreneurPulse.com.au  Fact Sheet for Healthcare Providers:  IncredibleEmployment.be  This test is no t yet approved or cleared by the [place] FDA and  has been authorized for detection and/or diagnosis of SARS-CoV-2 by FDA under an Emergency Use Authorization (EUA). This EUA will remain  in effect (meaning this test  can be used) for the duration of the COVID-19 declaration under Section 564(b)(1) of the Act, 21 U.S.C.section 360bbb-3(b)(1), unless the authorization is terminated  or revoked sooner.       Influenza A by PCR NEGATIVE NEGATIVE Final   Influenza B by PCR NEGATIVE NEGATIVE Final    Comment: (NOTE) The Xpert Xpress SARS-CoV-2/FLU/RSV plus assay is intended as an aid in the diagnosis of influenza from Nasopharyngeal swab specimens and should not be used as a sole basis for treatment. Nasal washings  and aspirates are unacceptable for Xpert Xpress SARS-CoV-2/FLU/RSV testing.  Fact Sheet for Patients: EntrepreneurPulse.com.au  Fact Sheet for Healthcare Providers: IncredibleEmployment.be  This test is not yet approved or cleared by the [place] FDA and has been authorized for detection and/or diagnosis of SARS-CoV-2 by FDA under an Emergency Use Authorization (EUA). This EUA will remain in effect (meaning this test can be used) for the duration of the COVID-19 declaration under Section 564(b)(1) of the Act, 21 U.S.C. section 360bbb-3(b)(1), unless the authorization is terminated or revoked.  Performed at [facility], [address], [place], [place] [address]          Radiology Studies: CT HEAD WO CONTRAST  Result Date: 03/07/2021 CLINICAL DATA:  Provided history: Stroke, follow-up. Additional history provided: Loss of balance, headache, left eye visual changes and lethargy, symptoms began 2 days ago. History of stroke. EXAM: CT HEAD WITHOUT CONTRAST TECHNIQUE: Contiguous axial images were obtained from the base of the skull through the vertex without intravenous contrast. RADIATION DOSE REDUCTION: This exam was performed according to the departmental dose-optimization program which includes automated exposure control, adjustment of the mA and/or kV according to patient size and/or use of iterative reconstruction technique. COMPARISON:  Brain MRI 11/01/2020. CT angiogram head/neck 11/01/2020. FINDINGS: Brain: Mild generalized cerebral and cerebellar atrophy. Redemonstrated large chronic cortical/subcortical right PCA territory infarct within the right temporal, occipital and parietal lobes as well as right thalamus. Redemonstrated chronic small-vessel infarcts within the bilateral basal ganglia and right thalamus. Advanced patchy and confluent hypoattenuation within the cerebral white matter, nonspecific but compatible with chronic  small vessel ischemic disease. There is no acute intracranial hemorrhage. No acute demarcated cortical infarct is identified. No extra-axial fluid collection. No evidence of an intracranial mass. No midline shift. Vascular: No hyperdense vessel.  Atherosclerotic calcifications. Skull: Normal. Negative for fracture or focal lesion. Sinuses/Orbits: Visualized orbits show no acute finding. Minimal mucosal thickening within a right ethmoid air cell. Other: Right mastoid effusion. IMPRESSION: No evidence of acute intracranial abnormality. Stable non-contrast CT appearance of the brain as compared to 11/01/2020. Large chronic right PCA territory infarct. Advanced chronic small vessel ischemic disease within the cerebral white matter. Small vessel infarcts within the bilateral basal ganglia and right thalamus. Mild generalized parenchymal atrophy. Right mastoid effusion. Electronically Signed   By: Kellie Simmering D.O.   On: 03/07/2021 15:58   MR BRAIN WO CONTRAST  Result Date: 03/07/2021 CLINICAL DATA:  Initial evaluation for acute neuro deficit, stroke suspected. EXAM: MRI HEAD WITHOUT CONTRAST TECHNIQUE: Multiplanar, multiecho pulse sequences of the brain and surrounding structures were obtained without intravenous contrast. COMPARISON:  Prior CT from earlier the same day as well as previous brain MRI from 11/01/2020. FINDINGS: Brain: Generalized age-related cerebral atrophy with moderate chronic microvascular ischemic disease. Remote lacunar infarcts present about the bilateral basal ganglia and right thalamus. Large remote right PCA distribution infarct involving the parasagittal right temporal occipital region. Wallerian degeneration at the right cerebral peduncle/midbrain noted. Small volume  acute ischemic infarct involving the left thalamus extending into the ventral left midbrain (series 5, images 25-21). Additional subcentimeter ischemic infarct noted involving the left subinsular white matter (series 5, image  21). No associated hemorrhage or mass effect. No other diffusion abnormality to suggest acute or subacute ischemia. No acute intracranial hemorrhage. No mass lesion, midline shift or mass effect. No hydrocephalus or extra-axial fluid collection. Pituitary gland suprasellar region normal. Midline structures intact. Vascular: Major intracranial vascular flow voids are maintained. Skull and upper cervical spine: Craniocervical junction within normal limits. Bone marrow signal intensity normal. No scalp soft tissue abnormality. Sinuses/Orbits: Left gaze noted. Globes and orbital soft tissues demonstrate no other acute finding. Paranasal sinuses are clear. Chronic appearing right mastoid effusion, similar to previous. Other: None. IMPRESSION: 1. Small acute ischemic nonhemorrhagic infarcts involving the left thalamus/midbrain and left subinsular white matter. 2. Large remote right PCA distribution infarct. 3. Underlying age-related cerebral atrophy with moderate chronic microvascular ischemic disease with several additional remote lacunar infarcts involving the bilateral basal ganglia and right thalamus. Electronically Signed   By: Jeannine Boga M.D.   On: 03/07/2021 21:53   US Carotid Bilateral  Result Date: 03/08/2021 CLINICAL DATA:  Initial evaluation for acute CVA. EXAM: BILATERAL CAROTID DUPLEX ULTRASOUND TECHNIQUE: Pearline Cables scale imaging, color Doppler and duplex ultrasound were performed of bilateral carotid and vertebral arteries in the neck. COMPARISON:  Prior MRI from 03/07/2021. FINDINGS: Criteria: Quantification of carotid stenosis is based on velocity parameters that correlate the residual internal carotid diameter with NASCET-based stenosis levels, using the diameter of the distal internal carotid lumen as the denominator for stenosis measurement. The following velocity measurements were obtained: RIGHT ICA: 83/26 cm/sec CCA: 02/40 cm/sec SYSTOLIC ICA/CCA RATIO:  1.5 ECA: 66 cm/sec LEFT ICA: 76/25  cm/sec CCA: 97/35 cm/sec SYSTOLIC ICA/CCA RATIO:  1.2 ECA: 61 cm/sec RIGHT CAROTID ARTERY: Minimal intimal thickening seen within the visualized right CCA without significant stenosis. Mild intimal thickening extends to involve the right carotid bulb and proximal right ICA. No significant echogenic plaque or narrowing. Visualized right ICA widely patent distally. RIGHT VERTEBRAL ARTERY:  Antegrade. LEFT CAROTID ARTERY: Mild intimal thickening seen within the visualized left CCA without significant stenosis. Small volume echogenic plaque present within the left carotid bulb. No elevation in peak systolic velocity to suggest hemodynamically significant stenosis. Visualized left ICA widely patent distally. LEFT VERTEBRAL ARTERY:  Antegrade. IMPRESSION: 1. Mild for age intimal thickening and plaque involving the left greater than right carotid bulbs, but with no sonographic evidence for hemodynamically significant stenosis. 2. Patent vertebral arteries with antegrade flow. Electronically Signed   By: Jeannine Boga M.D.   On: 03/08/2021 02:47   ECHOCARDIOGRAM LIMITED BUBBLE STUDY  Result Date: 03/08/2021    ECHOCARDIOGRAM LIMITED REPORT   Patient Name:   KASHONDA SARKISYAN Date of Exam: 03/08/2021 Medical Rec #:  [id number]   Height:       64.0 in Accession #:    [id number]  Weight:       148.0 lb Date of Birth:  1950/03/19   BSA:          1.721 m Patient Age:    72 years    BP:           144/76 mmHg Patient Gender: F           HR:           76 bpm. Exam Location:  [facility] Procedure: Limited Echo, Limited Color Doppler, Saline Contrast Bubble Study and  Intracardiac Opacification Agent Indications:     I63.9 Stroke  History:         Patient has prior history of Echocardiogram examinations, most                  recent 11/02/2020. Stroke; Risk Factors:Hypertension,                  Dyslipidemia and Diabetes.  Sonographer:     Charmayne Sheer Referring Phys:  [id number] JAN A MANSY Diagnosing Phys: Donnelly Angelica IMPRESSIONS   1. Left ventricular ejection fraction, by estimation, is 55 to 60%. The left ventricle has normal function. The left ventricle has no regional wall motion abnormalities.  2. Right ventricular systolic function is normal. The right ventricular size is normal.  3. The mitral valve is normal in structure. No evidence of mitral valve regurgitation.  4. The aortic valve is normal in structure. No aortic stenosis is present.  5. The inferior vena cava is normal in size with greater than 50% respiratory variability, suggesting right atrial pressure of 3 mmHg.  6. Agitated saline contrast bubble study was negative, with no evidence of any interatrial shunt. Conclusion(s)/Recommendation(s): LIMITED STUDY. DID NOT FULLY EVALUATE VALVULAR PATHOLOGY. NO EVIDENCE OF INTRATRIAL SHUNTING. FINDINGS  Left Ventricle: Left ventricular ejection fraction, by estimation, is 55 to 60%. The left ventricle has normal function. The left ventricle has no regional wall motion abnormalities. Definity contrast agent was given IV to delineate the left ventricular  endocardial borders. The left ventricular internal cavity size was normal in size. Right Ventricle: The right ventricular size is normal. No increase in right ventricular wall thickness. Right ventricular systolic function is normal. Pericardium: There is no evidence of pericardial effusion. Mitral Valve: The mitral valve is normal in structure. Tricuspid Valve: The tricuspid valve is normal in structure. Tricuspid valve regurgitation is not demonstrated. Aortic Valve: The aortic valve is normal in structure. No aortic stenosis is present. Venous: The inferior vena cava is normal in size with greater than 50% respiratory variability, suggesting right atrial pressure of 3 mmHg. IAS/Shunts: No atrial level shunt detected by color flow Doppler. Agitated saline contrast was given intravenously to evaluate for intracardiac shunting. Agitated saline contrast bubble study was negative, with no  evidence of any interatrial shunt. LEFT VENTRICLE PLAX 2D LVIDd:         4.44 cm LVIDs:         3.51 cm LV PW:         1.05 cm LV IVS:        0.78 cm  LV Volumes (MOD) LV vol d, MOD A2C: 65.0 ml LV vol d, MOD A4C: 66.9 ml LV vol s, MOD A2C: 20.1 ml LV vol s, MOD A4C: 14.8 ml LV SV MOD A2C:     44.9 ml LV SV MOD A4C:     66.9 ml LV SV MOD BP:      49.0 ml LEFT ATRIUM         Index LA diam:    2.70 cm 1.57 cm/m Donnelly Angelica Electronically signed by Donnelly Angelica Signature Date/Time: 03/08/2021/3:40:24 PM    Final         Scheduled Meds:   stroke: mapping our early stages of recovery book   Does not apply Once   aspirin EC  81 mg Oral Q breakfast   clopidogrel  75 mg Oral QPM   [START ON 03/11/2021] Dulaglutide  0.75 mg Subcutaneous Q Mon   enoxaparin (LOVENOX) injection  40 mg Subcutaneous Q24H   gemfibrozil  600 mg Oral BID AC   insulin aspart  0-6 Units Subcutaneous TID WC   [START ON 03/09/2021] insulin glargine-yfgn  18 Units Subcutaneous Q breakfast   levothyroxine  137 mcg Oral Q0600   pantoprazole  40 mg Oral Q breakfast   ramipril  2.5 mg Oral QPM   simvastatin  20 mg Oral QPM   Continuous Infusions:  sodium chloride 100 mL/hr at 03/08/21 0811     LOS: 1 day    Time spent: 35 minutes.     Elmarie Shiley, MD [facility]   If 7PM-7AM, please contact night-coverage [email or web address]  03/08/2021, 4:09 PM

## 2021-03-09 DIAGNOSIS — E7849 Other hyperlipidemia: Secondary | ICD-10-CM

## 2021-03-09 DIAGNOSIS — G8110 Spastic hemiplegia affecting unspecified side: Secondary | ICD-10-CM

## 2021-03-09 DIAGNOSIS — I669 Occlusion and stenosis of unspecified cerebral artery: Secondary | ICD-10-CM

## 2021-03-09 DIAGNOSIS — E7800 Pure hypercholesterolemia, unspecified: Secondary | ICD-10-CM

## 2021-03-09 DIAGNOSIS — I672 Cerebral atherosclerosis: Secondary | ICD-10-CM

## 2021-03-09 DIAGNOSIS — R2681 Unsteadiness on feet: Secondary | ICD-10-CM

## 2021-03-09 LAB — GLUCOSE, CAPILLARY
Glucose-Capillary: 146 mg/dL — ABNORMAL HIGH (ref 70–99)
Glucose-Capillary: 160 mg/dL — ABNORMAL HIGH (ref 70–99)

## 2021-03-09 MED ORDER — ASPIRIN 325 MG PO TABS: 325.0000 mg | ORAL_TABLET | Freq: Every day | ORAL | 2 refills | Status: AC

## 2021-03-09 MED ORDER — GEMFIBROZIL 600 MG PO TABS: 600.0000 mg | ORAL_TABLET | Freq: Two times a day (BID) | ORAL | 0 refills | Status: DC

## 2021-03-09 MED ORDER — ASPIRIN 325 MG PO TABS: 325.0000 mg | ORAL_TABLET | Freq: Every day | ORAL | Status: DC

## 2021-03-09 NOTE — Discharge Instructions (Signed)
Continue with aspirin 325 mg and plavix for 90 days then follow recommendations from your neurologist

## 2021-03-09 NOTE — Progress Notes (Signed)
Patient is being discharged home with husband and [place]. IV has been removed. Discharge instructions given and patient educated per documentation. Husband will be transportation

## 2021-03-09 NOTE — Discharge Summary (Signed)
Physician Discharge Summary  [name] WNI:[id number] DOB: 07-01-1950 DOA: 03/07/2021  PCP: Juluis Pitch, MD  Admit date: 03/07/2021 Discharge date: 03/09/2021  Admitted From: Home  Disposition:  Home   Recommendations for Outpatient Follow-up:  Follow up with PCP in 1-2 weeks Please obtain BMP/CBC in one week Needs further risk factors modification.  She was discharge on aspirin 325 mg and Plavix daily for 3 months, then will need to follow primary neurologist recommendation for antiplatelet therapy.  Consider ILR implantation out patient.   Home Health: Yes, HH PT>   Discharge Condition: Stable.  CODE STATUS: Full code Diet recommendation: Carb Modified   Brief/Interim Summary: 71 year old with past medical history significant for CVA with residual left-sided hemiparesis and left hand contracture, hypertension, dyslipidemia, hypothyroidism, depression, Crohn's disease, GERD, left-sided hemianopsia, diabetes type 2, who presents to the ED with  worsening left-sided hemiparesis over the last couple of days as well as gait abnormality.  She report her balance has been off and he will field  vision is a smaller.   CT head: Showed chronic right PCA territory infarct advanced chronic small vessel ischemic disease and cerebral white matter and a small vessel disease infarct within both basilar ganglia and right thalamus.   MRI of the brain showed a small acute ischemic nonhemorrhagic infarct involving the left thalamus/midbrain/and left subinsular white matter.   Patient admitted for further stroke evaluation.  1-Acute Stroke: Acute a small ischemic nonhemorrhagic infarct involving the left thalamus/midbrain and left subinsular white matter. -MRI of the brain: Small acute ischemic nonhemorrhagic infarct involving the left thalamus/midbrain and left subinsular white matter.  Large remote right PCA distribution infarct.  Age-related cerebral atrophy with moderate chronic microvascular  ischemic disease with several additional remote lacunar infarct involving the bilateral basal ganglia and right thalamus. -LDL: 61 triglyceride 215  -Continue with statins, wadded Lopid. -Patient on aspirin and Plavix, she was taking her medications at home.  We will follow neurology recommendation in regards antiplatelet therapy. -OT OT, speech consulted, HH PT, OT ordered.  -2D echo ordered no evidence of thrombosis.  -Permissive  hypertension -A1c: 7.4, recently started on dulaglutide -Neurology consulted. MRA neck and head ordered.    Hypertension: -Continue with Altace   Dyslipidemia:  -Continue with the statins, will add Lopid to help with hypertriglyceridemia   Diabetes type 2: -Continue with dulaglutide -Continue home regimen.    GERD: Continue with PPI   Hypothyroidism: Continue with Synthroid     Discharge Diagnoses:  Principal Problem:   Acute CVA (cerebrovascular accident) ([place]) Active Problems:   Unsteady gait   Diabetes mellitus ([place])   Hypothyroidism   Spastic hemiplegia affecting nondominant side ([place])   Hypercholesteremia   Hypertension    Discharge Instructions  Discharge Instructions     Ambulatory referral to Neurology   Complete by: As directed    An appointment is requested in approximately: 1 week   Diet - low sodium heart healthy   Complete by: As directed    Increase activity slowly   Complete by: As directed       Allergies as of 03/09/2021       Reactions   Codeine Other (See Comments)   Syncope.   Morphine And Related Nausea And Vomiting, Other (See Comments)   Hallucinations.   Baclofen Nausea Only, Other (See Comments)   dizziness   Metformin Hcl Other (See Comments)   Penicillins Other (See Comments)   Family history   Sulfamethoxazole Itching   Hands itching  Elemental Sulfur Itching   Per patient happened years ago.        Medication List     TAKE these medications    aspirin 325 MG tablet Take 1 tablet  (325 mg total) by mouth daily. Start taking on: March 10, 2021 What changed:  medication strength how much to take   clopidogrel 75 MG tablet Commonly known as: PLAVIX Take 1 tablet (75 mg total) by mouth daily. What changed: when to take this   Dulaglutide 0.75 MG/0.5ML Sopn Inject 0.75 mg into the skin every Monday.   gemfibrozil 600 MG tablet Commonly known as: LOPID Take 1 tablet (600 mg total) by mouth 2 (two) times daily before a meal.   levothyroxine 137 MCG tablet Commonly known as: SYNTHROID Take 137 mcg by mouth every morning.   pantoprazole 40 MG tablet Commonly known as: PROTONIX Take 40 mg by mouth in the morning.   ramipril 2.5 MG capsule Commonly known as: ALTACE Take 2.5 mg by mouth every evening.   simvastatin 20 MG tablet Commonly known as: ZOCOR Take 20 mg by mouth every evening.   Tyler Aas FlexTouch 100 UNIT/ML FlexTouch Pen Generic drug: insulin degludec Inject 30 Units into the skin in the morning.        Allergies  Allergen Reactions   Codeine Other (See Comments)    Syncope.   Morphine And Related Nausea And Vomiting and Other (See Comments)    Hallucinations.   Baclofen Nausea Only and Other (See Comments)    dizziness   Metformin Hcl Other (See Comments)   Penicillins Other (See Comments)    Family history   Sulfamethoxazole Itching    Hands itching    Elemental Sulfur Itching    Per patient happened years ago.    Consultations: Neurology    Procedures/Studies: CT HEAD WO CONTRAST  Result Date: 03/07/2021 CLINICAL DATA:  Provided history: Stroke, follow-up. Additional history provided: Loss of balance, headache, left eye visual changes and lethargy, symptoms began 2 days ago. History of stroke. EXAM: CT HEAD WITHOUT CONTRAST TECHNIQUE: Contiguous axial images were obtained from the base of the skull through the vertex without intravenous contrast. RADIATION DOSE REDUCTION: This exam was performed according to the  departmental dose-optimization program which includes automated exposure control, adjustment of the mA and/or kV according to patient size and/or use of iterative reconstruction technique. COMPARISON:  Brain MRI 11/01/2020. CT angiogram head/neck 11/01/2020. FINDINGS: Brain: Mild generalized cerebral and cerebellar atrophy. Redemonstrated large chronic cortical/subcortical right PCA territory infarct within the right temporal, occipital and parietal lobes as well as right thalamus. Redemonstrated chronic small-vessel infarcts within the bilateral basal ganglia and right thalamus. Advanced patchy and confluent hypoattenuation within the cerebral white matter, nonspecific but compatible with chronic small vessel ischemic disease. There is no acute intracranial hemorrhage. No acute demarcated cortical infarct is identified. No extra-axial fluid collection. No evidence of an intracranial mass. No midline shift. Vascular: No hyperdense vessel.  Atherosclerotic calcifications. Skull: Normal. Negative for fracture or focal lesion. Sinuses/Orbits: Visualized orbits show no acute finding. Minimal mucosal thickening within a right ethmoid air cell. Other: Right mastoid effusion. IMPRESSION: No evidence of acute intracranial abnormality. Stable non-contrast CT appearance of the brain as compared to 11/01/2020. Large chronic right PCA territory infarct. Advanced chronic small vessel ischemic disease within the cerebral white matter. Small vessel infarcts within the bilateral basal ganglia and right thalamus. Mild generalized parenchymal atrophy. Right mastoid effusion. Electronically Signed   By: Kellie Simmering D.O.  On: 03/07/2021 15:58   MR ANGIO HEAD WO CONTRAST  Result Date: 03/08/2021 CLINICAL DATA:  Stroke/TIA EXAM: MRA NECK WITHOUT AND WITH CONTRAST MRA HEAD WITHOUT CONTRAST TECHNIQUE: Multiplanar and multiecho pulse sequences of the neck were obtained without and with intravenous contrast. Angiographic images of the  neck were obtained using MRA technique without and with intravenous contrast; Angiographic images of the Circle of Willis were obtained using MRA technique without intravenous contrast. CONTRAST:  31m GADAVIST GADOBUTROL 1 MMOL/ML IV SOLN COMPARISON:  None. FINDINGS: MRA NECK FINDINGS There is no carotid or vertebral stenosis or other abnormality. MRA HEAD FINDINGS POSTERIOR CIRCULATION: --Vertebral arteries: Normal --Inferior cerebellar arteries: Normal. --Basilar artery: Normal. --Superior cerebellar arteries: Normal. --Posterior cerebral arteries: Severe stenosis at the left P2-3 junction. Diminutive distal right PCA is likely chronic. Multifocal bilateral atherosclerotic irregularity. ANTERIOR CIRCULATION: --Intracranial internal carotid arteries: Normal. --Anterior cerebral arteries (ACA): Normal. --Middle cerebral arteries (MCA): Normal. ANATOMIC VARIANTS: None IMPRESSION: 1. No emergent large vessel occlusion. 2. Severe stenosis at the left PCA P2-3 junction. 3. Multifocal atherosclerotic irregularity of the posterior cerebral arteries. 4. Normal MRA of the neck. Electronically Signed   By: KUlyses JarredM.D.   On: 03/08/2021 23:46   MR ANGIO NECK W WO CONTRAST  Result Date: 03/08/2021 CLINICAL DATA:  Stroke/TIA EXAM: MRA NECK WITHOUT AND WITH CONTRAST MRA HEAD WITHOUT CONTRAST TECHNIQUE: Multiplanar and multiecho pulse sequences of the neck were obtained without and with intravenous contrast. Angiographic images of the neck were obtained using MRA technique without and with intravenous contrast; Angiographic images of the Circle of Willis were obtained using MRA technique without intravenous contrast. CONTRAST:  712mGADAVIST GADOBUTROL 1 MMOL/ML IV SOLN COMPARISON:  None. FINDINGS: MRA NECK FINDINGS There is no carotid or vertebral stenosis or other abnormality. MRA HEAD FINDINGS POSTERIOR CIRCULATION: --Vertebral arteries: Normal --Inferior cerebellar arteries: Normal. --Basilar artery: Normal.  --Superior cerebellar arteries: Normal. --Posterior cerebral arteries: Severe stenosis at the left P2-3 junction. Diminutive distal right PCA is likely chronic. Multifocal bilateral atherosclerotic irregularity. ANTERIOR CIRCULATION: --Intracranial internal carotid arteries: Normal. --Anterior cerebral arteries (ACA): Normal. --Middle cerebral arteries (MCA): Normal. ANATOMIC VARIANTS: None IMPRESSION: 1. No emergent large vessel occlusion. 2. Severe stenosis at the left PCA P2-3 junction. 3. Multifocal atherosclerotic irregularity of the posterior cerebral arteries. 4. Normal MRA of the neck. Electronically Signed   By: KeUlyses Jarred.D.   On: 03/08/2021 23:46   MR BRAIN WO CONTRAST  Result Date: 03/07/2021 CLINICAL DATA:  Initial evaluation for acute neuro deficit, stroke suspected. EXAM: MRI HEAD WITHOUT CONTRAST TECHNIQUE: Multiplanar, multiecho pulse sequences of the brain and surrounding structures were obtained without intravenous contrast. COMPARISON:  Prior CT from earlier the same day as well as previous brain MRI from 11/01/2020. FINDINGS: Brain: Generalized age-related cerebral atrophy with moderate chronic microvascular ischemic disease. Remote lacunar infarcts present about the bilateral basal ganglia and right thalamus. Large remote right PCA distribution infarct involving the parasagittal right temporal occipital region. Wallerian degeneration at the right cerebral peduncle/midbrain noted. Small volume acute ischemic infarct involving the left thalamus extending into the ventral left midbrain (series 5, images 25-21). Additional subcentimeter ischemic infarct noted involving the left subinsular white matter (series 5, image 21). No associated hemorrhage or mass effect. No other diffusion abnormality to suggest acute or subacute ischemia. No acute intracranial hemorrhage. No mass lesion, midline shift or mass effect. No hydrocephalus or extra-axial fluid collection. Pituitary gland suprasellar  region normal. Midline structures intact. Vascular: Major intracranial vascular flow voids are  maintained. Skull and upper cervical spine: Craniocervical junction within normal limits. Bone marrow signal intensity normal. No scalp soft tissue abnormality. Sinuses/Orbits: Left gaze noted. Globes and orbital soft tissues demonstrate no other acute finding. Paranasal sinuses are clear. Chronic appearing right mastoid effusion, similar to previous. Other: None. IMPRESSION: 1. Small acute ischemic nonhemorrhagic infarcts involving the left thalamus/midbrain and left subinsular white matter. 2. Large remote right PCA distribution infarct. 3. Underlying age-related cerebral atrophy with moderate chronic microvascular ischemic disease with several additional remote lacunar infarcts involving the bilateral basal ganglia and right thalamus. Electronically Signed   By: Jeannine Boga M.D.   On: 03/07/2021 21:53   US Carotid Bilateral  Result Date: 03/08/2021 CLINICAL DATA:  Initial evaluation for acute CVA. EXAM: BILATERAL CAROTID DUPLEX ULTRASOUND TECHNIQUE: Pearline Cables scale imaging, color Doppler and duplex ultrasound were performed of bilateral carotid and vertebral arteries in the neck. COMPARISON:  Prior MRI from 03/07/2021. FINDINGS: Criteria: Quantification of carotid stenosis is based on velocity parameters that correlate the residual internal carotid diameter with NASCET-based stenosis levels, using the diameter of the distal internal carotid lumen as the denominator for stenosis measurement. The following velocity measurements were obtained: RIGHT ICA: 83/26 cm/sec CCA: 95/09 cm/sec SYSTOLIC ICA/CCA RATIO:  1.5 ECA: 66 cm/sec LEFT ICA: 76/25 cm/sec CCA: 32/67 cm/sec SYSTOLIC ICA/CCA RATIO:  1.2 ECA: 61 cm/sec RIGHT CAROTID ARTERY: Minimal intimal thickening seen within the visualized right CCA without significant stenosis. Mild intimal thickening extends to involve the right carotid bulb and proximal right ICA.  No significant echogenic plaque or narrowing. Visualized right ICA widely patent distally. RIGHT VERTEBRAL ARTERY:  Antegrade. LEFT CAROTID ARTERY: Mild intimal thickening seen within the visualized left CCA without significant stenosis. Small volume echogenic plaque present within the left carotid bulb. No elevation in peak systolic velocity to suggest hemodynamically significant stenosis. Visualized left ICA widely patent distally. LEFT VERTEBRAL ARTERY:  Antegrade. IMPRESSION: 1. Mild for age intimal thickening and plaque involving the left greater than right carotid bulbs, but with no sonographic evidence for hemodynamically significant stenosis. 2. Patent vertebral arteries with antegrade flow. Electronically Signed   By: Jeannine Boga M.D.   On: 03/08/2021 02:47   ECHOCARDIOGRAM LIMITED BUBBLE STUDY  Result Date: 03/08/2021    ECHOCARDIOGRAM LIMITED REPORT   Patient Name:   [name] Date of Exam: 03/08/2021 Medical Rec #:  [id number]   Height:       64.0 in Accession #:    [id number]  Weight:       148.0 lb Date of Birth:  [birth year]   BSA:          1.721 m Patient Age:    53 years    BP:           144/76 mmHg Patient Gender: F           HR:           76 bpm. Exam Location:  [facility] Procedure: Limited Echo, Limited Color Doppler, Saline Contrast Bubble Study and            Intracardiac Opacification Agent Indications:     I63.9 Stroke  History:         Patient has prior history of Echocardiogram examinations, most                  recent 11/02/2020. Stroke; Risk Factors:Hypertension,                  Dyslipidemia and Diabetes.  Sonographer:  Charmayne Sheer Referring Phys:  [id number] JAN A MANSY Diagnosing Phys: Donnelly Angelica IMPRESSIONS  1. Left ventricular ejection fraction, by estimation, is 55 to 60%. The left ventricle has normal function. The left ventricle has no regional wall motion abnormalities.  2. Right ventricular systolic function is normal. The right ventricular size is normal.  3. The mitral  valve is normal in structure. No evidence of mitral valve regurgitation.  4. The aortic valve is normal in structure. No aortic stenosis is present.  5. The inferior vena cava is normal in size with greater than 50% respiratory variability, suggesting right atrial pressure of 3 mmHg.  6. Agitated saline contrast bubble study was negative, with no evidence of any interatrial shunt. Conclusion(s)/Recommendation(s): LIMITED STUDY. DID NOT FULLY EVALUATE VALVULAR PATHOLOGY. NO EVIDENCE OF INTRATRIAL SHUNTING. FINDINGS  Left Ventricle: Left ventricular ejection fraction, by estimation, is 55 to 60%. The left ventricle has normal function. The left ventricle has no regional wall motion abnormalities. Definity contrast agent was given IV to delineate the left ventricular  endocardial borders. The left ventricular internal cavity size was normal in size. Right Ventricle: The right ventricular size is normal. No increase in right ventricular wall thickness. Right ventricular systolic function is normal. Pericardium: There is no evidence of pericardial effusion. Mitral Valve: The mitral valve is normal in structure. Tricuspid Valve: The tricuspid valve is normal in structure. Tricuspid valve regurgitation is not demonstrated. Aortic Valve: The aortic valve is normal in structure. No aortic stenosis is present. Venous: The inferior vena cava is normal in size with greater than 50% respiratory variability, suggesting right atrial pressure of 3 mmHg. IAS/Shunts: No atrial level shunt detected by color flow Doppler. Agitated saline contrast was given intravenously to evaluate for intracardiac shunting. Agitated saline contrast bubble study was negative, with no evidence of any interatrial shunt. LEFT VENTRICLE PLAX 2D LVIDd:         4.44 cm LVIDs:         3.51 cm LV PW:         1.05 cm LV IVS:        0.78 cm  LV Volumes (MOD) LV vol d, MOD A2C: 65.0 ml LV vol d, MOD A4C: 66.9 ml LV vol s, MOD A2C: 20.1 ml LV vol s, MOD A4C: 14.8  ml LV SV MOD A2C:     44.9 ml LV SV MOD A4C:     66.9 ml LV SV MOD BP:      49.0 ml LEFT ATRIUM         Index LA diam:    2.70 cm 1.57 cm/m Donnelly Angelica Electronically signed by Donnelly Angelica Signature Date/Time: 03/08/2021/3:40:24 PM    Final      Subjective: She feels stable. No new complaints.   Discharge Exam: Vitals:   03/09/21 0718 03/09/21 1148  BP: 114/73 119/73  Pulse: 84 85  Resp: 15 15  Temp: 98.4 F (36.9 C) 98.4 F (36.9 C)  SpO2: 98% 98%     General: Pt is alert, awake, not in acute distress Cardiovascular: RRR, S1/S2 +, no rubs, no gallops Respiratory: CTA bilaterally, no wheezing, no rhonchi Abdominal: Soft, NT, ND, bowel sounds + Extremities: no edema, no cyanosis    The results of significant diagnostics from this hospitalization (including imaging, microbiology, ancillary and laboratory) are listed below for reference.     Microbiology: Recent Results (from the past 240 hour(s))  Resp Panel by RT-PCR (Flu A&B, Covid) Nasopharyngeal Swab  Status: None   Collection Time: 03/07/21  7:15 PM   Specimen: Nasopharyngeal Swab; Nasopharyngeal(NP) swabs in vial transport medium  Result Value Ref Range Status   SARS Coronavirus 2 by RT PCR NEGATIVE NEGATIVE Final    Comment: (NOTE) SARS-CoV-2 target nucleic acids are NOT DETECTED.  The SARS-CoV-2 RNA is generally detectable in upper respiratory specimens during the acute phase of infection. The lowest concentration of SARS-CoV-2 viral copies this assay can detect is 138 copies/mL. A negative result does not preclude SARS-Cov-2 infection and should not be used as the sole basis for treatment or other patient management decisions. A negative result may occur with  improper specimen collection/handling, submission of specimen other than nasopharyngeal swab, presence of viral mutation(s) within the areas targeted by this assay, and inadequate number of viral copies(<138 copies/mL). A negative result must be combined  with clinical observations, patient history, and epidemiological information. The expected result is Negative.  Fact Sheet for Patients:  EntrepreneurPulse.com.au  Fact Sheet for Healthcare Providers:  IncredibleEmployment.be  This test is no t yet approved or cleared by the [place] FDA and  has been authorized for detection and/or diagnosis of SARS-CoV-2 by FDA under an Emergency Use Authorization (EUA). This EUA will remain  in effect (meaning this test can be used) for the duration of the COVID-19 declaration under Section 564(b)(1) of the Act, 21 U.S.C.section 360bbb-3(b)(1), unless the authorization is terminated  or revoked sooner.       Influenza A by PCR NEGATIVE NEGATIVE Final   Influenza B by PCR NEGATIVE NEGATIVE Final    Comment: (NOTE) The Xpert Xpress SARS-CoV-2/FLU/RSV plus assay is intended as an aid in the diagnosis of influenza from Nasopharyngeal swab specimens and should not be used as a sole basis for treatment. Nasal washings and aspirates are unacceptable for Xpert Xpress SARS-CoV-2/FLU/RSV testing.  Fact Sheet for Patients: EntrepreneurPulse.com.au  Fact Sheet for Healthcare Providers: IncredibleEmployment.be  This test is not yet approved or cleared by the [place] FDA and has been authorized for detection and/or diagnosis of SARS-CoV-2 by FDA under an Emergency Use Authorization (EUA). This EUA will remain in effect (meaning this test can be used) for the duration of the COVID-19 declaration under Section 564(b)(1) of the Act, 21 U.S.C. section 360bbb-3(b)(1), unless the authorization is terminated or revoked.  Performed at [facility], [address] [place], [place] [address]      Labs: BNP (last 3 results) No results for input(s): BNP in the last 8760 hours. Basic Metabolic Panel: Recent Labs  Lab 03/07/21 1536 03/08/21 0938  NA 136 139  K  3.7 3.7  CL 102 107  CO2 27 24  GLUCOSE 158* 119*  BUN 10 11  CREATININE 0.76 0.59  CALCIUM 9.4 9.1   Liver Function Tests: Recent Labs  Lab 03/07/21 1536  AST 20  ALT 15  ALKPHOS 54  BILITOT 0.4  PROT 7.5  ALBUMIN 4.3   No results for input(s): LIPASE, AMYLASE in the last 168 hours. No results for input(s): AMMONIA in the last 168 hours. CBC: Recent Labs  Lab 03/07/21 1536 03/08/21 0938  WBC 7.1 6.2  NEUTROABS 4.5  --   HGB 11.5* 11.1*  HCT 37.7 36.3  MCV 77.7* 77.2*  PLT 238 234   Cardiac Enzymes: No results for input(s): CKTOTAL, CKMB, CKMBINDEX, TROPONINI in the last 168 hours. BNP: Invalid input(s): POCBNP CBG: Recent Labs  Lab 03/08/21 1142 03/08/21 1730 03/08/21 2013 03/09/21 0749 03/09/21 1149  GLUCAP 142*  118* 151* 146* 160*   D-Dimer No results for input(s): DDIMER in the last 72 hours. Hgb A1c Recent Labs    03/08/21 0632  HGBA1C 7.4*   Lipid Profile Recent Labs    03/08/21 0632  CHOL 142  HDL 38*  LDLCALC 61  TRIG 215*  CHOLHDL 3.7   Thyroid function studies No results for input(s): TSH, T4TOTAL, T3FREE, THYROIDAB in the last 72 hours.  Invalid input(s): FREET3 Anemia work up No results for input(s): VITAMINB12, FOLATE, FERRITIN, TIBC, IRON, RETICCTPCT in the last 72 hours. Urinalysis    Component Value Date/Time   COLORURINE STRAW (A) 03/08/2021 1241   APPEARANCEUR CLEAR (A) 03/08/2021 1241   LABSPEC 1.006 03/08/2021 1241   PHURINE 7.0 03/08/2021 1241   GLUCOSEU NEGATIVE 03/08/2021 1241   HGBUR NEGATIVE 03/08/2021 1241   BILIRUBINUR NEGATIVE 03/08/2021 1241   KETONESUR NEGATIVE 03/08/2021 1241   PROTEINUR NEGATIVE 03/08/2021 1241   UROBILINOGEN 0.2 12/06/2013 1244   NITRITE NEGATIVE 03/08/2021 1241   LEUKOCYTESUR NEGATIVE 03/08/2021 1241   Sepsis Labs Invalid input(s): PROCALCITONIN,  WBC,  LACTICIDVEN Microbiology Recent Results (from the past 240 hour(s))  Resp Panel by RT-PCR (Flu A&B, Covid) Nasopharyngeal  Swab     Status: None   Collection Time: 03/07/21  7:15 PM   Specimen: Nasopharyngeal Swab; Nasopharyngeal(NP) swabs in vial transport medium  Result Value Ref Range Status   SARS Coronavirus 2 by RT PCR NEGATIVE NEGATIVE Final    Comment: (NOTE) SARS-CoV-2 target nucleic acids are NOT DETECTED.  The SARS-CoV-2 RNA is generally detectable in upper respiratory specimens during the acute phase of infection. The lowest concentration of SARS-CoV-2 viral copies this assay can detect is 138 copies/mL. A negative result does not preclude SARS-Cov-2 infection and should not be used as the sole basis for treatment or other patient management decisions. A negative result may occur with  improper specimen collection/handling, submission of specimen other than nasopharyngeal swab, presence of viral mutation(s) within the areas targeted by this assay, and inadequate number of viral copies(<138 copies/mL). A negative result must be combined with clinical observations, patient history, and epidemiological information. The expected result is Negative.  Fact Sheet for Patients:  EntrepreneurPulse.com.au  Fact Sheet for Healthcare Providers:  IncredibleEmployment.be  This test is no t yet approved or cleared by the [place] FDA and  has been authorized for detection and/or diagnosis of SARS-CoV-2 by FDA under an Emergency Use Authorization (EUA). This EUA will remain  in effect (meaning this test can be used) for the duration of the COVID-19 declaration under Section 564(b)(1) of the Act, 21 U.S.C.section 360bbb-3(b)(1), unless the authorization is terminated  or revoked sooner.       Influenza A by PCR NEGATIVE NEGATIVE Final   Influenza B by PCR NEGATIVE NEGATIVE Final    Comment: (NOTE) The Xpert Xpress SARS-CoV-2/FLU/RSV plus assay is intended as an aid in the diagnosis of influenza from Nasopharyngeal swab specimens and should not be used as a sole  basis for treatment. Nasal washings and aspirates are unacceptable for Xpert Xpress SARS-CoV-2/FLU/RSV testing.  Fact Sheet for Patients: EntrepreneurPulse.com.au  Fact Sheet for Healthcare Providers: IncredibleEmployment.be  This test is not yet approved or cleared by the [place] FDA and has been authorized for detection and/or diagnosis of SARS-CoV-2 by FDA under an Emergency Use Authorization (EUA). This EUA will remain in effect (meaning this test can be used) for the duration of the COVID-19 declaration under Section 564(b)(1) of the Act, 21 U.S.C. section 360bbb-3(b)(1),  unless the authorization is terminated or revoked.  Performed at [facility], [address] [place], [place] [address]      Time coordinating discharge: 40 minutes  SIGNED:   Elmarie Shiley, MD  [facility]

## 2021-03-09 NOTE — TOC Initial Note (Addendum)
Transition of Care [facility]) - Initial/Assessment Note    Patient Details  Name: [name] MRN: [id number] Date of Birth: February 07, 1951  [facility] of [facility]) CM/SW Contact:    Magnus Ivan, LCSW Phone Number: 03/09/2021, 9:51 AM  Clinical Narrative:             Spoke to patient's spouse regarding DC planning. Patient lives with spouse. PCP is Dr. Lovie Macadamia. [facility]acy is [facility] [address]. Patient has a shower bench, grab bars in the shower and both toilets, ramp, wheelchair, RW, and lift recliner.  Explained PT and OT recs. Agreeable to [facility]. Used [facility] in the past. Does not think patient needs a 3in1. [name] with [facility] said that they can accept patient back. [name] is aware of DC today pending Neuro recs per MD.   Expected Discharge Plan: [place] Barriers to Discharge: Continued Medical Work up   Patient Goals and CMS Choice Patient states their goals for this hospitalization and ongoing recovery are:: home with home health CMS Medicare.gov Compare Post Acute Care list provided to:: Patient Represenative (must comment) Choice offered to / list presented to : Spouse  Expected Discharge Plan and Services Expected Discharge Plan: [place]       Living arrangements for the past 2 months: Single Family Home                           HH Arranged: PT, OT [place] Agency: [facility] Date HH Agency Contacted: 03/09/21   Representative spoke with at [place]: [name]  Prior Living Arrangements/Services Living arrangements for the past 2 months: [place] Lives with:: Spouse Patient language and need for interpreter reviewed:: Yes        Need for Family Participation in Patient Care: Yes (Comment) Care giver support system in place?: Yes (comment) Current home services: DME Criminal Activity/Legal Involvement Pertinent to Current Situation/Hospitalization: No - Comment as  needed  Activities of Daily Living Home Assistive Devices/Equipment: Cane (specify quad or straight), Grab bars in shower, Eyeglasses, Brace (specify type) (L Leg brace for stability) ADL Screening (condition at time of admission) Patient's cognitive ability adequate to safely complete daily activities?: Yes Is the patient deaf or have difficulty hearing?: No Does the patient have difficulty seeing, even when wearing glasses/contacts?: Yes Does the patient have difficulty concentrating, remembering, or making decisions?: No Patient able to express need for assistance with ADLs?: Yes Does the patient have difficulty dressing or bathing?: No Independently performs ADLs?: Yes (appropriate for developmental age) Does the patient have difficulty walking or climbing stairs?: No Weakness of Legs: Left Weakness of Arms/Hands: Left  Permission Sought/Granted Permission sought to share information with : Facility Art therapist granted to share information with : Yes, Verbal Permission Granted     Permission granted to share info w AGENCY: [place] agencies        Emotional Assessment         Alcohol / Substance Use: Not Applicable Psych Involvement: No (comment)  Admission diagnosis:  Unsteady gait [R26.81] CVA (cerebral vascular accident) ([place]) [I63.9] Acute CVA (cerebrovascular accident) ([place]) [I63.9] Cerebrovascular accident (CVA), unspecified mechanism ([place]) [I63.9] Patient Active Problem List   Diagnosis Date Noted   Acute CVA (cerebrovascular accident) ([place]) 03/07/2021   CVA (cerebral vascular accident) ([place]) 11/01/2020   Chronic pain 04/25/2020   Chronic constipation 04/25/2020   Gastro-esophageal reflux disease without  esophagitis 04/25/2020   Hardening of the aorta (main artery of the heart) ([place]) 04/25/2020   History of iron deficiency anemia 04/25/2020   Hypercholesteremia 04/25/2020   Hypertension 04/25/2020   Personal history of transient ischemic  attack (TIA), and cerebral infarction without residual deficits 04/25/2020   Pulmonary emphysema ([place]) 04/25/2020   Vitamin D deficiency 04/25/2020   Tremor, essential 04/24/2020   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   Right lower quadrant abdominal pain 08/18/2018   Spastic hemiplegia affecting nondominant side ([place]) 02/03/2014   Diabetes mellitus ([place]) 09/07/2013   Hypothyroidism 09/07/2013   Type 2 diabetes mellitus without complications ([place]) 36/62/9476   Hemiparesis and alteration of sensations as late effects of stroke ([place]) 01/26/2013   Unsteady gait 01/26/2013   Dizziness and giddiness 01/26/2013   PCP:  Juluis Pitch, MD Pharmacy:   [facility] [place], [place] - [place] AT [place] [address] [place] [address] Phone: [phone] Fax: [phone]     Social Determinants of Health (SDOH) Interventions    Readmission Risk Interventions No flowsheet data found.

## 2021-03-09 NOTE — Consult Note (Signed)
Neurology Progress Note [name] MR# [id number] 03/09/2021  S: no overnight events; no new complaints.  O: Current vital signs: BP 114/73 (BP Location: Right Arm)    Pulse 84    Temp 98.4 F (36.9 C) (Oral)    Resp 15    Ht 5\' 4"  (1.626 m)    Wt 67.1 kg    SpO2 98%    BMI 25.40 kg/m  Vital signs in last 24 hours: Temp:  [97.5 F (36.4 C)-98.8 F (37.1 C)] 98.4 F (36.9 C) (01/14 0718) Pulse Rate:  [77-95] 84 (01/14 0718) Resp:  [15-21] 15 (01/14 0718) BP: (112-138)/(68-80) 114/73 (01/14 0718) SpO2:  [96 %-100 %] 98 % (01/14 0718) GENERAL: Awake, alert in NAD HEENT: Normocephalic and atraumatic, moist mm, no LN++, no thyromegaly LUNGS: symmetric excursions bilaterally with no audible wheezes. CV: RR, equal pulses bilaterally. ABDOMEN: Soft, nontender, nondistended with normoactive BS Ext: warm, well perfused, intact peripheral pulses  NEURO:  Patient is awake, alert, oriented to person, place, month, year, and situation. Patient is able to give a clear and coherent history. Speech is fluent, intact comprehension and repetition. No signs of aphasia or neglect. Visual Fields left hemianopia. Pupils are equal, round, and reactive to light. EOMI without ptosis or diploplia.  Facial sensation is symmetric to temperature Facial movement is symmetric.  Hearing is intact to voice. Uvula midline and palate elevates symmetrically. Shoulder shrug is symmetric. Tongue is midline without atrophy or fasciculations.  Tone is increased in left upper and lower extremities. Bulk is decreased LUE. Left upper extremity contracted ~3/4. LLE ~3+/5. Left grip strength  Sensation is symmetric to light touch and temperature in the arms and legs. Deep Tendon Reflexes: 3+ LEU and LLE. Babinski (+) L FNF and HKS unable to perform on left. Gait - Deferred  NIHSS 5  I have reviewed labs in epic and the pertinent results are: LDL 61 A1c 7.4  Echocardiogram 03/08/2021 read as EF 55-60%, no LV  WMA, no mention of shunt or LVT.  I have reviewed the images obtained: MRI brain showed small acute ischemic nonhemorrhagic infarcts involving the left thalamus/midbrain and left subinsular white matter. Large remote right PCA distribution infarct. Underlying age-related cerebral atrophy with moderate chronic microvascular ischemic disease with several additional remote lacunar infarcts involving the bilateral basal ganglia and right thalamus.  MRA head and neck showed mild stenosis in distal left M1, severe stenosis at the left P2-3 junction. Diminutive distal right PCA is likely chronic. There is progressive moderate/severe stenoses within a superior division mid M2 right MCA vessel, and a severe stenosis within an inferior division proximal M2 left MCA vessel.   stenosis within the distal M1 left middle cerebral artery. Progressive atherosclerotic disease within the M2 and more distal middle cerebral artery vessels bilaterally. Most notably, there are   Normal MRA of the neck.  Left M2 stenosis:   Assessment: [name] is a 71 y.o. female PMHx right PCA stroke with residual hemiparesis and hemianopia with small acute embolic strokes in left PCA territory. Imaging showed intracranial stenotic lesions.  At the time of the initial stroke 2009, CTA showed right PCA occlusion however, there was no intracranial atherosclerosis or stenoses which may imply a central source of emboli.   CTA results 2009: "No significant carotid or cervical vertebrobasilar atherosclerotic vascular disease is noted."  She has had Holter monitor without events and she may benefit from ILR.  Impression:  Small acute embolic left PCA territory strokes. Chronic right PCA territory  embolic stroke. Multifocal bilateral atherosclerotic stenoses/irregularity. Left spastic hemiparesis residual from prior stroke. NIHSS 5    Plan: - A1c is >7 and not at goal she will need better glycemic control. - Continue Statin  unchanged LDL is at goal. - Increase aspirin to 325mg  daily indefinitely. - Clopidogrel 75mg  daily for 90 days or as determined by outpatient. - BP goal <130/90. - Stroke workup is complete. - Consider ILR implantation as outpatient. - She will need outpatient neurology follow up. - Neurology will remain available, please call for questions.  Electronically signed by:  Lynnae Sandhoff, MD Page: [phone] 03/09/2021, 9:01 AM  If 7pm- 7am, please page neurology on call as listed in [place].

## 2021-03-12 DIAGNOSIS — Z993 Dependence on wheelchair: Secondary | ICD-10-CM | POA: Diagnosis not present

## 2021-03-12 DIAGNOSIS — I69354 Hemiplegia and hemiparesis following cerebral infarction affecting left non-dominant side: Secondary | ICD-10-CM | POA: Diagnosis not present

## 2021-03-14 ENCOUNTER — Telehealth: Payer: Self-pay | Admitting: Podiatry

## 2021-03-14 NOTE — Telephone Encounter (Signed)
Pt left message asking for a call back from [employer] that does the diabetic shoes in [place] office..  Pt missed last appt due to in the hospital and is scheduled to see Dr Posey Pronto 1.26.2023 in [place].Marland Kitchen

## 2021-03-15 NOTE — Progress Notes (Signed)
03/15/21 at 11:40 am Call from patient who says she has not heard from [place]. Notified [name] with [facility] who says someone will reach out to patient today. Provided main # for [place] Office to patient.  Oleh Genin, [place]

## 2021-03-16 DIAGNOSIS — I639 Cerebral infarction, unspecified: Secondary | ICD-10-CM | POA: Diagnosis not present

## 2021-03-16 DIAGNOSIS — G8110 Spastic hemiplegia affecting unspecified side: Secondary | ICD-10-CM | POA: Diagnosis not present

## 2021-03-16 DIAGNOSIS — I10 Essential (primary) hypertension: Secondary | ICD-10-CM | POA: Diagnosis not present

## 2021-03-18 ENCOUNTER — Telehealth: Payer: Self-pay | Admitting: Podiatry

## 2021-03-18 NOTE — Telephone Encounter (Signed)
Pt left message for a call back about her diabetic shoes that were ordered.  I returned call and pt states since the last shoes were ordered she has had a stroke and the physical therapist seen the boots she currently has and recommending pt get them again to help with her support of her ankle. She thought we had ordered new balance.   I told pt that I would get them ordered ( I verified it was the black velcro boot) if ok with Aaron Edelman and if he has any issues we would call her back. I have went ahead and scheduled her to see Aaron Edelman on 2.10.23 in  to pick them up.

## 2021-03-19 ENCOUNTER — Other Ambulatory Visit: Payer: Self-pay

## 2021-03-19 ENCOUNTER — Ambulatory Visit: Payer: HMO | Admitting: Podiatry

## 2021-03-19 DIAGNOSIS — Z794 Long term (current) use of insulin: Secondary | ICD-10-CM

## 2021-03-19 DIAGNOSIS — M79675 Pain in left toe(s): Secondary | ICD-10-CM

## 2021-03-19 DIAGNOSIS — M79674 Pain in right toe(s): Secondary | ICD-10-CM | POA: Diagnosis not present

## 2021-03-19 DIAGNOSIS — E1142 Type 2 diabetes mellitus with diabetic polyneuropathy: Secondary | ICD-10-CM

## 2021-03-19 DIAGNOSIS — B351 Tinea unguium: Secondary | ICD-10-CM | POA: Diagnosis not present

## 2021-03-20 ENCOUNTER — Other Ambulatory Visit: Payer: Self-pay | Admitting: Podiatry

## 2021-03-20 DIAGNOSIS — G8110 Spastic hemiplegia affecting unspecified side: Secondary | ICD-10-CM | POA: Diagnosis not present

## 2021-03-20 DIAGNOSIS — I10 Essential (primary) hypertension: Secondary | ICD-10-CM | POA: Diagnosis not present

## 2021-03-20 DIAGNOSIS — I639 Cerebral infarction, unspecified: Secondary | ICD-10-CM | POA: Diagnosis not present

## 2021-03-21 ENCOUNTER — Ambulatory Visit: Payer: HMO | Admitting: Podiatry

## 2021-03-22 DIAGNOSIS — K219 Gastro-esophageal reflux disease without esophagitis: Secondary | ICD-10-CM | POA: Diagnosis not present

## 2021-03-22 DIAGNOSIS — M24542 Contracture, left hand: Secondary | ICD-10-CM | POA: Diagnosis not present

## 2021-03-22 DIAGNOSIS — I69354 Hemiplegia and hemiparesis following cerebral infarction affecting left non-dominant side: Secondary | ICD-10-CM | POA: Diagnosis not present

## 2021-03-22 DIAGNOSIS — Z87891 Personal history of nicotine dependence: Secondary | ICD-10-CM | POA: Diagnosis not present

## 2021-03-22 DIAGNOSIS — Z9181 History of falling: Secondary | ICD-10-CM | POA: Diagnosis not present

## 2021-03-22 DIAGNOSIS — E7800 Pure hypercholesterolemia, unspecified: Secondary | ICD-10-CM | POA: Diagnosis not present

## 2021-03-22 DIAGNOSIS — F32A Depression, unspecified: Secondary | ICD-10-CM | POA: Diagnosis not present

## 2021-03-22 DIAGNOSIS — F410 Panic disorder [episodic paroxysmal anxiety] without agoraphobia: Secondary | ICD-10-CM | POA: Diagnosis not present

## 2021-03-22 DIAGNOSIS — G250 Essential tremor: Secondary | ICD-10-CM | POA: Diagnosis not present

## 2021-03-22 DIAGNOSIS — I10 Essential (primary) hypertension: Secondary | ICD-10-CM | POA: Diagnosis not present

## 2021-03-22 DIAGNOSIS — Z7902 Long term (current) use of antithrombotics/antiplatelets: Secondary | ICD-10-CM | POA: Diagnosis not present

## 2021-03-22 DIAGNOSIS — E039 Hypothyroidism, unspecified: Secondary | ICD-10-CM | POA: Diagnosis not present

## 2021-03-22 DIAGNOSIS — K5090 Crohn's disease, unspecified, without complications: Secondary | ICD-10-CM | POA: Diagnosis not present

## 2021-03-22 DIAGNOSIS — Z7985 Long-term (current) use of injectable non-insulin antidiabetic drugs: Secondary | ICD-10-CM | POA: Diagnosis not present

## 2021-03-22 DIAGNOSIS — H5347 Heteronymous bilateral field defects: Secondary | ICD-10-CM | POA: Diagnosis not present

## 2021-03-22 DIAGNOSIS — Z7982 Long term (current) use of aspirin: Secondary | ICD-10-CM | POA: Diagnosis not present

## 2021-03-22 DIAGNOSIS — E119 Type 2 diabetes mellitus without complications: Secondary | ICD-10-CM | POA: Diagnosis not present

## 2021-03-22 DIAGNOSIS — K5900 Constipation, unspecified: Secondary | ICD-10-CM | POA: Diagnosis not present

## 2021-03-22 DIAGNOSIS — Z794 Long term (current) use of insulin: Secondary | ICD-10-CM | POA: Diagnosis not present

## 2021-03-26 ENCOUNTER — Encounter: Payer: Self-pay | Admitting: Podiatry

## 2021-03-26 DIAGNOSIS — R3589 Other polyuria: Secondary | ICD-10-CM | POA: Diagnosis not present

## 2021-03-26 NOTE — Progress Notes (Signed)
This patient returns to my office for at risk foot care.  This patient requires this care by a professional since this patient will be at risk due to having diabetes mellitus and dropfoot left foot.  This patient is unable to cut nails herself since the patient cannot reach her nails.These nails are painful walking and wearing shoes.  This patient presents for at risk foot care today.  General Appearance  Alert, conversant and in no acute stress.  Vascular  Dorsalis pedis and posterior tibial  pulses are palpable  bilaterally.  Capillary return is within normal limits  bilaterally. Temperature is within normal limits  Bilaterally.  Swelling left leg and foot.  Neurologic  Senn-Weinstein monofilament wire test diminished   bilaterally. Muscle power within normal limits bilaterally.  Nails Thick disfigured discolored nails with subungual debris  from hallux to fifth toes bilaterally. No evidence of bacterial infection or drainage bilaterally.  Orthopedic  No crepitus or effusions noted.  No bony pathology or digital deformities noted.  Normal motion and strength right foot.  Limited ROM and muscle strength left foot.  Skin  normotropic skin with no porokeratosis noted bilaterally.  No signs of infections or ulcers noted.     Onychomycosis  Pain in right toes  Pain in left toes  Consent was obtained for treatment procedures.   Mechanical debridement of nails 1-5  bilaterally performed with a nail nipper.  Filed with dremel without incident.      Return office visit   8 weeks                  Told patient to return for periodic foot care and evaluation due to potential at risk complications.   Boneta Lucks D.P.M.

## 2021-03-27 DIAGNOSIS — F32A Depression, unspecified: Secondary | ICD-10-CM | POA: Diagnosis not present

## 2021-03-27 DIAGNOSIS — Z7985 Long-term (current) use of injectable non-insulin antidiabetic drugs: Secondary | ICD-10-CM | POA: Diagnosis not present

## 2021-03-27 DIAGNOSIS — F410 Panic disorder [episodic paroxysmal anxiety] without agoraphobia: Secondary | ICD-10-CM | POA: Diagnosis not present

## 2021-03-27 DIAGNOSIS — M24542 Contracture, left hand: Secondary | ICD-10-CM | POA: Diagnosis not present

## 2021-03-27 DIAGNOSIS — K5090 Crohn's disease, unspecified, without complications: Secondary | ICD-10-CM | POA: Diagnosis not present

## 2021-03-27 DIAGNOSIS — I69354 Hemiplegia and hemiparesis following cerebral infarction affecting left non-dominant side: Secondary | ICD-10-CM | POA: Diagnosis not present

## 2021-03-27 DIAGNOSIS — Z7982 Long term (current) use of aspirin: Secondary | ICD-10-CM | POA: Diagnosis not present

## 2021-03-27 DIAGNOSIS — Z9181 History of falling: Secondary | ICD-10-CM | POA: Diagnosis not present

## 2021-03-27 DIAGNOSIS — E119 Type 2 diabetes mellitus without complications: Secondary | ICD-10-CM | POA: Diagnosis not present

## 2021-03-27 DIAGNOSIS — H5347 Heteronymous bilateral field defects: Secondary | ICD-10-CM | POA: Diagnosis not present

## 2021-03-27 DIAGNOSIS — K5900 Constipation, unspecified: Secondary | ICD-10-CM | POA: Diagnosis not present

## 2021-03-27 DIAGNOSIS — K219 Gastro-esophageal reflux disease without esophagitis: Secondary | ICD-10-CM | POA: Diagnosis not present

## 2021-03-27 DIAGNOSIS — E7800 Pure hypercholesterolemia, unspecified: Secondary | ICD-10-CM | POA: Diagnosis not present

## 2021-03-27 DIAGNOSIS — E039 Hypothyroidism, unspecified: Secondary | ICD-10-CM | POA: Diagnosis not present

## 2021-03-27 DIAGNOSIS — Z7902 Long term (current) use of antithrombotics/antiplatelets: Secondary | ICD-10-CM | POA: Diagnosis not present

## 2021-03-27 DIAGNOSIS — Z794 Long term (current) use of insulin: Secondary | ICD-10-CM | POA: Diagnosis not present

## 2021-03-27 DIAGNOSIS — G250 Essential tremor: Secondary | ICD-10-CM | POA: Diagnosis not present

## 2021-03-27 DIAGNOSIS — Z87891 Personal history of nicotine dependence: Secondary | ICD-10-CM | POA: Diagnosis not present

## 2021-03-27 DIAGNOSIS — R278 Other lack of coordination: Secondary | ICD-10-CM | POA: Diagnosis not present

## 2021-03-27 DIAGNOSIS — I10 Essential (primary) hypertension: Secondary | ICD-10-CM | POA: Diagnosis not present

## 2021-03-29 DIAGNOSIS — M24542 Contracture, left hand: Secondary | ICD-10-CM | POA: Diagnosis not present

## 2021-03-29 DIAGNOSIS — H5347 Heteronymous bilateral field defects: Secondary | ICD-10-CM | POA: Diagnosis not present

## 2021-03-29 DIAGNOSIS — Z7902 Long term (current) use of antithrombotics/antiplatelets: Secondary | ICD-10-CM | POA: Diagnosis not present

## 2021-03-29 DIAGNOSIS — Z87891 Personal history of nicotine dependence: Secondary | ICD-10-CM | POA: Diagnosis not present

## 2021-03-29 DIAGNOSIS — F410 Panic disorder [episodic paroxysmal anxiety] without agoraphobia: Secondary | ICD-10-CM | POA: Diagnosis not present

## 2021-03-29 DIAGNOSIS — K5090 Crohn's disease, unspecified, without complications: Secondary | ICD-10-CM | POA: Diagnosis not present

## 2021-03-29 DIAGNOSIS — F32A Depression, unspecified: Secondary | ICD-10-CM | POA: Diagnosis not present

## 2021-03-29 DIAGNOSIS — Z794 Long term (current) use of insulin: Secondary | ICD-10-CM | POA: Diagnosis not present

## 2021-03-29 DIAGNOSIS — I10 Essential (primary) hypertension: Secondary | ICD-10-CM | POA: Diagnosis not present

## 2021-03-29 DIAGNOSIS — G250 Essential tremor: Secondary | ICD-10-CM | POA: Diagnosis not present

## 2021-03-29 DIAGNOSIS — R278 Other lack of coordination: Secondary | ICD-10-CM | POA: Diagnosis not present

## 2021-03-29 DIAGNOSIS — E7800 Pure hypercholesterolemia, unspecified: Secondary | ICD-10-CM | POA: Diagnosis not present

## 2021-03-29 DIAGNOSIS — Z7982 Long term (current) use of aspirin: Secondary | ICD-10-CM | POA: Diagnosis not present

## 2021-03-29 DIAGNOSIS — K219 Gastro-esophageal reflux disease without esophagitis: Secondary | ICD-10-CM | POA: Diagnosis not present

## 2021-03-29 DIAGNOSIS — I69354 Hemiplegia and hemiparesis following cerebral infarction affecting left non-dominant side: Secondary | ICD-10-CM | POA: Diagnosis not present

## 2021-03-29 DIAGNOSIS — E119 Type 2 diabetes mellitus without complications: Secondary | ICD-10-CM | POA: Diagnosis not present

## 2021-03-29 DIAGNOSIS — E039 Hypothyroidism, unspecified: Secondary | ICD-10-CM | POA: Diagnosis not present

## 2021-03-29 DIAGNOSIS — Z7985 Long-term (current) use of injectable non-insulin antidiabetic drugs: Secondary | ICD-10-CM | POA: Diagnosis not present

## 2021-03-29 DIAGNOSIS — K5900 Constipation, unspecified: Secondary | ICD-10-CM | POA: Diagnosis not present

## 2021-03-29 DIAGNOSIS — Z9181 History of falling: Secondary | ICD-10-CM | POA: Diagnosis not present

## 2021-03-31 DIAGNOSIS — M6281 Muscle weakness (generalized): Secondary | ICD-10-CM | POA: Diagnosis not present

## 2021-04-03 DIAGNOSIS — E039 Hypothyroidism, unspecified: Secondary | ICD-10-CM | POA: Diagnosis not present

## 2021-04-03 DIAGNOSIS — I69354 Hemiplegia and hemiparesis following cerebral infarction affecting left non-dominant side: Secondary | ICD-10-CM | POA: Diagnosis not present

## 2021-04-03 DIAGNOSIS — E7800 Pure hypercholesterolemia, unspecified: Secondary | ICD-10-CM | POA: Diagnosis not present

## 2021-04-03 DIAGNOSIS — I10 Essential (primary) hypertension: Secondary | ICD-10-CM | POA: Diagnosis not present

## 2021-04-03 DIAGNOSIS — E119 Type 2 diabetes mellitus without complications: Secondary | ICD-10-CM | POA: Diagnosis not present

## 2021-04-03 DIAGNOSIS — H5347 Heteronymous bilateral field defects: Secondary | ICD-10-CM | POA: Diagnosis not present

## 2021-04-03 DIAGNOSIS — M24542 Contracture, left hand: Secondary | ICD-10-CM | POA: Diagnosis not present

## 2021-04-05 ENCOUNTER — Other Ambulatory Visit: Payer: Self-pay

## 2021-04-05 ENCOUNTER — Ambulatory Visit: Payer: HMO

## 2021-04-05 DIAGNOSIS — M24542 Contracture, left hand: Secondary | ICD-10-CM | POA: Diagnosis not present

## 2021-04-05 DIAGNOSIS — G250 Essential tremor: Secondary | ICD-10-CM | POA: Diagnosis not present

## 2021-04-05 DIAGNOSIS — E039 Hypothyroidism, unspecified: Secondary | ICD-10-CM | POA: Diagnosis not present

## 2021-04-05 DIAGNOSIS — Z7985 Long-term (current) use of injectable non-insulin antidiabetic drugs: Secondary | ICD-10-CM | POA: Diagnosis not present

## 2021-04-05 DIAGNOSIS — Z7902 Long term (current) use of antithrombotics/antiplatelets: Secondary | ICD-10-CM | POA: Diagnosis not present

## 2021-04-05 DIAGNOSIS — Z794 Long term (current) use of insulin: Secondary | ICD-10-CM

## 2021-04-05 DIAGNOSIS — H5347 Heteronymous bilateral field defects: Secondary | ICD-10-CM | POA: Diagnosis not present

## 2021-04-05 DIAGNOSIS — I69354 Hemiplegia and hemiparesis following cerebral infarction affecting left non-dominant side: Secondary | ICD-10-CM | POA: Diagnosis not present

## 2021-04-05 DIAGNOSIS — Z9181 History of falling: Secondary | ICD-10-CM | POA: Diagnosis not present

## 2021-04-05 DIAGNOSIS — R278 Other lack of coordination: Secondary | ICD-10-CM | POA: Diagnosis not present

## 2021-04-05 DIAGNOSIS — Z7982 Long term (current) use of aspirin: Secondary | ICD-10-CM | POA: Diagnosis not present

## 2021-04-05 DIAGNOSIS — E1142 Type 2 diabetes mellitus with diabetic polyneuropathy: Secondary | ICD-10-CM

## 2021-04-05 DIAGNOSIS — I10 Essential (primary) hypertension: Secondary | ICD-10-CM | POA: Diagnosis not present

## 2021-04-05 DIAGNOSIS — F32A Depression, unspecified: Secondary | ICD-10-CM | POA: Diagnosis not present

## 2021-04-05 DIAGNOSIS — Z87891 Personal history of nicotine dependence: Secondary | ICD-10-CM | POA: Diagnosis not present

## 2021-04-05 DIAGNOSIS — E7800 Pure hypercholesterolemia, unspecified: Secondary | ICD-10-CM | POA: Diagnosis not present

## 2021-04-05 DIAGNOSIS — K219 Gastro-esophageal reflux disease without esophagitis: Secondary | ICD-10-CM | POA: Diagnosis not present

## 2021-04-05 DIAGNOSIS — K5900 Constipation, unspecified: Secondary | ICD-10-CM | POA: Diagnosis not present

## 2021-04-05 DIAGNOSIS — E119 Type 2 diabetes mellitus without complications: Secondary | ICD-10-CM | POA: Diagnosis not present

## 2021-04-05 DIAGNOSIS — F410 Panic disorder [episodic paroxysmal anxiety] without agoraphobia: Secondary | ICD-10-CM | POA: Diagnosis not present

## 2021-04-05 DIAGNOSIS — K5090 Crohn's disease, unspecified, without complications: Secondary | ICD-10-CM | POA: Diagnosis not present

## 2021-04-05 NOTE — Progress Notes (Signed)
SITUATION Reason for Visit: Delivery of previously billed diabetic shoes Patient / Caregiver Report:  Patient present for shoe fit  OBJECTIVE DATA: Patient History / Diagnosis:     ICD-10-CM   1. Type 2 diabetes mellitus with diabetic polyneuropathy, with long-term current use of insulin (HCC)  E11.42    Z79.4       Change in Status:   None  ACTIONS PERFORMED: In-Person Delivery, patient was fit with: - 1x pair A5500 PDAC approved prefabricated Diabetic Shoes: same as last order - [employer] velcro boots  Shoes and insoles were verified for structural integrity and safety. Patient wore shoes and insoles in office. Skin was inspected and free of areas of concern after wearing shoes and inserts. Shoes and inserts fit properly. Patient / Caregiver provided with ferbal instruction and demonstration regarding donning, doffing, wear, care, proper fit, function, purpose, cleaning, and use of shoes and insoles ' and in all related precautions and risks and benefits regarding shoes and insoles. Patient / Caregiver was instructed to wear properly fitting socks with shoes at all times. Patient was also provided with verbal instruction regarding how to report any failures or malfunctions of shoes or inserts, and necessary follow up care. Patient / Caregiver was also instructed to contact physician regarding change in status that may affect function of shoes and inserts.   Patient / Caregiver verbalized undersatnding of instruction provided. Patient / Caregiver demonstrated independence with proper donning and doffing of shoes and inserts.  Patient's AFO does not correctly fit the shoes due to the strut interacting poorly with the back of the boot. Recommended patient schedule a follow-up in [place] office for brace/shoe modification to improve fit and function.  PLAN Admin to reach out and schedule appointment for follow-up. Plan of care was discussed with and agreed upon by patient and/or caregiver.  All questions were answered and concerns addressed.

## 2021-04-09 ENCOUNTER — Encounter: Payer: Self-pay | Admitting: Podiatry

## 2021-04-09 ENCOUNTER — Other Ambulatory Visit: Payer: Self-pay

## 2021-04-09 ENCOUNTER — Ambulatory Visit: Payer: HMO | Admitting: Podiatry

## 2021-04-09 DIAGNOSIS — M216X2 Other acquired deformities of left foot: Secondary | ICD-10-CM

## 2021-04-09 DIAGNOSIS — Z794 Long term (current) use of insulin: Secondary | ICD-10-CM

## 2021-04-09 DIAGNOSIS — E1142 Type 2 diabetes mellitus with diabetic polyneuropathy: Secondary | ICD-10-CM | POA: Diagnosis not present

## 2021-04-09 DIAGNOSIS — L989 Disorder of the skin and subcutaneous tissue, unspecified: Secondary | ICD-10-CM

## 2021-04-09 DIAGNOSIS — M21372 Foot drop, left foot: Secondary | ICD-10-CM

## 2021-04-10 DIAGNOSIS — Z87891 Personal history of nicotine dependence: Secondary | ICD-10-CM | POA: Diagnosis not present

## 2021-04-10 DIAGNOSIS — K5090 Crohn's disease, unspecified, without complications: Secondary | ICD-10-CM | POA: Diagnosis not present

## 2021-04-10 DIAGNOSIS — I69354 Hemiplegia and hemiparesis following cerebral infarction affecting left non-dominant side: Secondary | ICD-10-CM | POA: Diagnosis not present

## 2021-04-10 DIAGNOSIS — M24542 Contracture, left hand: Secondary | ICD-10-CM | POA: Diagnosis not present

## 2021-04-10 DIAGNOSIS — G250 Essential tremor: Secondary | ICD-10-CM | POA: Diagnosis not present

## 2021-04-10 DIAGNOSIS — Z9181 History of falling: Secondary | ICD-10-CM | POA: Diagnosis not present

## 2021-04-10 DIAGNOSIS — R278 Other lack of coordination: Secondary | ICD-10-CM | POA: Diagnosis not present

## 2021-04-10 DIAGNOSIS — Z794 Long term (current) use of insulin: Secondary | ICD-10-CM | POA: Diagnosis not present

## 2021-04-10 DIAGNOSIS — K5900 Constipation, unspecified: Secondary | ICD-10-CM | POA: Diagnosis not present

## 2021-04-10 DIAGNOSIS — Z7982 Long term (current) use of aspirin: Secondary | ICD-10-CM | POA: Diagnosis not present

## 2021-04-10 DIAGNOSIS — H5347 Heteronymous bilateral field defects: Secondary | ICD-10-CM | POA: Diagnosis not present

## 2021-04-10 DIAGNOSIS — E119 Type 2 diabetes mellitus without complications: Secondary | ICD-10-CM | POA: Diagnosis not present

## 2021-04-10 DIAGNOSIS — I10 Essential (primary) hypertension: Secondary | ICD-10-CM | POA: Diagnosis not present

## 2021-04-10 DIAGNOSIS — Z7985 Long-term (current) use of injectable non-insulin antidiabetic drugs: Secondary | ICD-10-CM | POA: Diagnosis not present

## 2021-04-10 DIAGNOSIS — K219 Gastro-esophageal reflux disease without esophagitis: Secondary | ICD-10-CM | POA: Diagnosis not present

## 2021-04-10 DIAGNOSIS — E039 Hypothyroidism, unspecified: Secondary | ICD-10-CM | POA: Diagnosis not present

## 2021-04-10 DIAGNOSIS — E7800 Pure hypercholesterolemia, unspecified: Secondary | ICD-10-CM | POA: Diagnosis not present

## 2021-04-10 DIAGNOSIS — Z7902 Long term (current) use of antithrombotics/antiplatelets: Secondary | ICD-10-CM | POA: Diagnosis not present

## 2021-04-10 DIAGNOSIS — F32A Depression, unspecified: Secondary | ICD-10-CM | POA: Diagnosis not present

## 2021-04-10 DIAGNOSIS — F410 Panic disorder [episodic paroxysmal anxiety] without agoraphobia: Secondary | ICD-10-CM | POA: Diagnosis not present

## 2021-04-11 DIAGNOSIS — E119 Type 2 diabetes mellitus without complications: Secondary | ICD-10-CM | POA: Diagnosis not present

## 2021-04-11 DIAGNOSIS — K5900 Constipation, unspecified: Secondary | ICD-10-CM | POA: Diagnosis not present

## 2021-04-11 DIAGNOSIS — M24542 Contracture, left hand: Secondary | ICD-10-CM | POA: Diagnosis not present

## 2021-04-11 DIAGNOSIS — Z7985 Long-term (current) use of injectable non-insulin antidiabetic drugs: Secondary | ICD-10-CM | POA: Diagnosis not present

## 2021-04-11 DIAGNOSIS — K219 Gastro-esophageal reflux disease without esophagitis: Secondary | ICD-10-CM | POA: Diagnosis not present

## 2021-04-11 DIAGNOSIS — E7800 Pure hypercholesterolemia, unspecified: Secondary | ICD-10-CM | POA: Diagnosis not present

## 2021-04-11 DIAGNOSIS — Z794 Long term (current) use of insulin: Secondary | ICD-10-CM | POA: Diagnosis not present

## 2021-04-11 DIAGNOSIS — F32A Depression, unspecified: Secondary | ICD-10-CM | POA: Diagnosis not present

## 2021-04-11 DIAGNOSIS — I69354 Hemiplegia and hemiparesis following cerebral infarction affecting left non-dominant side: Secondary | ICD-10-CM | POA: Diagnosis not present

## 2021-04-11 DIAGNOSIS — Z7902 Long term (current) use of antithrombotics/antiplatelets: Secondary | ICD-10-CM | POA: Diagnosis not present

## 2021-04-11 DIAGNOSIS — I10 Essential (primary) hypertension: Secondary | ICD-10-CM | POA: Diagnosis not present

## 2021-04-11 DIAGNOSIS — Z9181 History of falling: Secondary | ICD-10-CM | POA: Diagnosis not present

## 2021-04-11 DIAGNOSIS — Z87891 Personal history of nicotine dependence: Secondary | ICD-10-CM | POA: Diagnosis not present

## 2021-04-11 DIAGNOSIS — G250 Essential tremor: Secondary | ICD-10-CM | POA: Diagnosis not present

## 2021-04-11 DIAGNOSIS — R278 Other lack of coordination: Secondary | ICD-10-CM | POA: Diagnosis not present

## 2021-04-11 DIAGNOSIS — H5347 Heteronymous bilateral field defects: Secondary | ICD-10-CM | POA: Diagnosis not present

## 2021-04-11 DIAGNOSIS — F410 Panic disorder [episodic paroxysmal anxiety] without agoraphobia: Secondary | ICD-10-CM | POA: Diagnosis not present

## 2021-04-11 DIAGNOSIS — Z7982 Long term (current) use of aspirin: Secondary | ICD-10-CM | POA: Diagnosis not present

## 2021-04-11 DIAGNOSIS — K5090 Crohn's disease, unspecified, without complications: Secondary | ICD-10-CM | POA: Diagnosis not present

## 2021-04-11 DIAGNOSIS — E039 Hypothyroidism, unspecified: Secondary | ICD-10-CM | POA: Diagnosis not present

## 2021-04-15 ENCOUNTER — Other Ambulatory Visit: Payer: Self-pay

## 2021-04-15 ENCOUNTER — Ambulatory Visit: Payer: HMO

## 2021-04-15 DIAGNOSIS — I69398 Other sequelae of cerebral infarction: Secondary | ICD-10-CM

## 2021-04-15 DIAGNOSIS — I69359 Hemiplegia and hemiparesis following cerebral infarction affecting unspecified side: Secondary | ICD-10-CM

## 2021-04-15 DIAGNOSIS — E1142 Type 2 diabetes mellitus with diabetic polyneuropathy: Secondary | ICD-10-CM

## 2021-04-15 NOTE — Progress Notes (Signed)
SITUATION Reason for Consult: Follow-up with diabetic shoes and AFO Patient / Caregiver Report: Patient feels the AFO has never supported her the way her old metal and leather one did.  OBJECTIVE DATA History / Diagnosis:    ICD-10-CM   1. Type 2 diabetes mellitus with diabetic polyneuropathy, with long-term current use of insulin (HCC)  E11.42    Z79.4     2. Hemiparesis and alteration of sensations as late effects of stroke [facility])  M03.754    H60.677       Change in Pathology: None  ACTIONS PERFORMED Patient's equipment was checked for structural stability and fit. Added 1" heel wedge to inside of diabetic shoe and thinned out insole in order to allow brace to clear. Patient is able to ambulate but has no mediolateral support. Patient requires this support and used to have a custom device. Have spoked with practice head for authorization to correct the situation. Device(s) intact and fit is adequate. All questions answered and concerns addressed.  PLAN Dr. Amalia Hailey to be contacted to begin diabetic shoe eval process as well as prescription for custom AFO. Patient is to be contacted for scheduling. Plan of care discussed with and agreed upon by patient / caregiver.

## 2021-04-17 DIAGNOSIS — Z7902 Long term (current) use of antithrombotics/antiplatelets: Secondary | ICD-10-CM | POA: Diagnosis not present

## 2021-04-17 DIAGNOSIS — E7800 Pure hypercholesterolemia, unspecified: Secondary | ICD-10-CM | POA: Diagnosis not present

## 2021-04-17 DIAGNOSIS — Z9181 History of falling: Secondary | ICD-10-CM | POA: Diagnosis not present

## 2021-04-17 DIAGNOSIS — K5090 Crohn's disease, unspecified, without complications: Secondary | ICD-10-CM | POA: Diagnosis not present

## 2021-04-17 DIAGNOSIS — I10 Essential (primary) hypertension: Secondary | ICD-10-CM | POA: Diagnosis not present

## 2021-04-17 DIAGNOSIS — M24542 Contracture, left hand: Secondary | ICD-10-CM | POA: Diagnosis not present

## 2021-04-17 DIAGNOSIS — I69354 Hemiplegia and hemiparesis following cerebral infarction affecting left non-dominant side: Secondary | ICD-10-CM | POA: Diagnosis not present

## 2021-04-17 DIAGNOSIS — G250 Essential tremor: Secondary | ICD-10-CM | POA: Diagnosis not present

## 2021-04-17 DIAGNOSIS — K5900 Constipation, unspecified: Secondary | ICD-10-CM | POA: Diagnosis not present

## 2021-04-17 DIAGNOSIS — H5347 Heteronymous bilateral field defects: Secondary | ICD-10-CM | POA: Diagnosis not present

## 2021-04-17 DIAGNOSIS — Z794 Long term (current) use of insulin: Secondary | ICD-10-CM | POA: Diagnosis not present

## 2021-04-17 DIAGNOSIS — E039 Hypothyroidism, unspecified: Secondary | ICD-10-CM | POA: Diagnosis not present

## 2021-04-17 DIAGNOSIS — R278 Other lack of coordination: Secondary | ICD-10-CM | POA: Diagnosis not present

## 2021-04-17 DIAGNOSIS — F410 Panic disorder [episodic paroxysmal anxiety] without agoraphobia: Secondary | ICD-10-CM | POA: Diagnosis not present

## 2021-04-17 DIAGNOSIS — Z7982 Long term (current) use of aspirin: Secondary | ICD-10-CM | POA: Diagnosis not present

## 2021-04-17 DIAGNOSIS — Z87891 Personal history of nicotine dependence: Secondary | ICD-10-CM | POA: Diagnosis not present

## 2021-04-17 DIAGNOSIS — K219 Gastro-esophageal reflux disease without esophagitis: Secondary | ICD-10-CM | POA: Diagnosis not present

## 2021-04-17 DIAGNOSIS — F32A Depression, unspecified: Secondary | ICD-10-CM | POA: Diagnosis not present

## 2021-04-17 DIAGNOSIS — Z7985 Long-term (current) use of injectable non-insulin antidiabetic drugs: Secondary | ICD-10-CM | POA: Diagnosis not present

## 2021-04-17 DIAGNOSIS — E119 Type 2 diabetes mellitus without complications: Secondary | ICD-10-CM | POA: Diagnosis not present

## 2021-04-17 NOTE — Progress Notes (Signed)
HPI: 71 y.o. female presenting today PMHx diabetes mellitus with history of dropfoot left lower extremity presenting for evaluation of a symptomatic calluses developed to the left fifth toe.  Patient states that it is painful with walking.  She has tried OTC corn pads with minimal relief.  She presents for further treatment and evaluation  Past Medical History:  Diagnosis Date   Abdominal pain 12/06/2013   Abnormality of gait 01/26/2013   Altered bowel habits 12/11/2017   Bruises easily    Constipation    Crohn's disease ([place])    Depression    Dizziness and giddiness 01/26/2013   Fecal incontinence alternating with constipation 12/11/2017   GERD (gastroesophageal reflux disease)    Hemianopia    left   Hemiparesis and alteration of sensations as late effects of stroke ([place]) 01/26/2013   Hyperlipidemia    Hypertension    Hypothyroidism    Lump in female breast    Panic disorder    PONV (postoperative nausea and vomiting)    Stroke ([place]) 2009   Tremor, essential 04/24/2020   Type II diabetes mellitus ([place])     Past Surgical History:  Procedure Laterality Date   ANAL FISSURE REPAIR  2008   APPENDECTOMY  1986   BREAST BIOPSY Left 2006   neg   BREAST LUMPECTOMY Right 1990's   CESAREAN SECTION  1981; [place]  05/1991   TUBAL LIGATION  1983    Allergies  Allergen Reactions   Codeine Other (See Comments)    Syncope.   Morphine And Related Nausea And Vomiting and Other (See Comments)    Hallucinations.   Baclofen Nausea Only and Other (See Comments)    dizziness   Metformin Hcl Other (See Comments)   Penicillins Other (See Comments)    Family history   Sulfamethoxazole Itching    Hands itching    Elemental Sulfur Itching    Per patient happened years ago.     Physical Exam: General: The patient is alert and oriented x3 in no acute distress.  Dermatology: Skin is warm, dry and supple bilateral lower extremities. Negative  for open lesions or macerations.  Hyperkeratotic symptomatic corn noted to the left fifth toe  Vascular: Palpable pedal pulses bilaterally. Capillary refill within normal limits.  Negative for any significant edema or erythema  Neurological: Light touch and protective threshold grossly intact  Musculoskeletal Exam: Dropfoot deformity left lower extremity with cavovarus deformity of the medial longitudinal arch bilateral  Assessment: 1.  Symptomatic callus/corn left fifth toe 2.  History of dropfoot left lower extremity 3.  Cavovarus left  Plan of Care:  1. Patient evaluated. 2.  Excisional debridement of the hyperkeratotic corn/callus was performed to the left fifth toe.  Patient felt immediate relief 3.  Continue the AFO.  Patient has an appointment with Pedorthist for modification of the AFO 4.  Appointment with Pedorthist also for diabetic shoes and insoles 5.  Return to clinic as needed with me      Edrick Kins, DPM [facility]  Dr. Edrick Kins, DPM    [address], [place] [address]  Office [phone]  Fax [phone]

## 2021-04-22 ENCOUNTER — Other Ambulatory Visit: Payer: Self-pay

## 2021-04-22 ENCOUNTER — Ambulatory Visit: Payer: HMO

## 2021-04-22 DIAGNOSIS — M21372 Foot drop, left foot: Secondary | ICD-10-CM

## 2021-04-22 DIAGNOSIS — L989 Disorder of the skin and subcutaneous tissue, unspecified: Secondary | ICD-10-CM

## 2021-04-22 DIAGNOSIS — E1142 Type 2 diabetes mellitus with diabetic polyneuropathy: Secondary | ICD-10-CM

## 2021-04-22 DIAGNOSIS — Z794 Long term (current) use of insulin: Secondary | ICD-10-CM

## 2021-04-22 NOTE — Progress Notes (Signed)
SITUATION Patient Name:  [name] MRN:   [id number] Reason for Visit: Evaluation for [facility] AFO  Patient Report: Chief Complaint:   Foot drop, tone, ml instability Nature of Discomfort/Pain:  Ambulatory Standing Resting Location:    left lower extremity Onset & Duration:   Sudden, Started with accident, and Present longer than 3 months Course:    gradually worsening Aggravating or Alleviating Factors: Ambulation  OBJECTIVE DATA & MEASUREMENTS Prognosis:    Good Duration of use:   5 years  Diagnosis:   ICD-10-CM   1. Type 2 diabetes mellitus with diabetic polyneuropathy, with long-term current use of insulin (HCC)  E11.42    Z79.4     2. Benign skin lesion  L98.9     3. Foot drop, left  M21.372       GOALS, NECESSITIES, & JUSTIFICATIONS Recommended Device: [place] Gauntlet Color:    Black Closure:   Velcro  Laterality HCPCS Code Description Justification  left [id number] Plastic orthosis, custom molded from a model of the patient, custom fabricated, includes casting and cast preparation. Necessary to provide triplanar support to the foot/ankle complex  left L2330 Addition to lower extremity, lacer molded to patient model Necessary to ensure secure hold of orthosis to patient's limb  left L2820 Addition to lower extremity orthosis, soft interface for molded plastic below knee section Necessary to relieve pressure on bony prominences    I certify that [name] qualifies for and will benefit from an ankle foot orthosis used during ambulation based on meeting all of the following criteria;   The patient is: - Ambulatory, and - Has weakness or deformity of the foot and ankle, and - Requires stabilization for medical reasons, and - Has the potential to benefit functionally  The patients medical record contains sufficient documentation of the patients medical condition to substantiate the necessity for the type and quantity of the items ordered.  The goals of this  therapy: - Improve Mobility - Improve Lower Extremity Stability - Decrease Pain - Facilitate Soft Tissue Healing - Facilitate Immobilization, healing and treatment of an injury  Necessity of Ankle Foot Orthotic molded to patient model: A custom (vs. prefabricated) ankle foot orthosis has been prescribed based on the following criteria which are specific to the condition of this patient; - The patient could not be fit with a prefabricated AFO - The condition necessitating the orthosis is expected to be permanent or of longstanding duration (more than 6 months) - There is need to control the ankle or foot in more than one plane - The patient has a documented neurological, circulatory, or orthopedic condition that requires custom fabrication over a model to prevent tissue injury - The patient has a healing fracture that lacks normal anatomical integrity or anthropometric proportions  I hereby certify that the ankle foot orthotic described above is a rigid or semi-rigid device which is used for the purpose of supporting a weak or deformed body member or restricting or eliminating motion in a diseased or injured part of the body. It is designed to provide support and counterforce on the limb or body part that is being braced. In my opinion, the custom molded ankle foot orthosis is both reasonable and necessary in reference to accepted standards of medical practice in the treatment  of the patient condition and rehabilitation.  ACTIONS PERFORMED Patient was evaluated and casted for [place] AFO via [employer]. Procedure was explained to patient. Patient tolerated procedure. patient selected device color and closure method.  PLAN Patient to return in four to six weeks for fitting and delivery of device. Plan of care was explained to and agreed upon by patient. All questions were answered and concerns addressed.

## 2021-04-22 NOTE — Progress Notes (Signed)
SITUATION Reason for Consult: Evaluation for Prefabricated Diabetic Shoes and Custom Diabetic Inserts. Patient / Caregiver Report: Patient would like well fitting shoes  OBJECTIVE DATA: Patient History / Diagnosis:    ICD-10-CM   1. Type 2 diabetes mellitus with diabetic polyneuropathy, with long-term current use of insulin (HCC)  E11.42    Z79.4     2. Benign skin lesion  L98.9     3. Foot drop, left  M21.372       Current or Previous Devices:   Historical shoe user  In-Person Foot Examination: Ulcers & Callousing:   Callous on 5th base due to cavovarus deformity  Deformities:   - Pes cavus   Shoe Size: 8.52M  ORTHOTIC RECOMMENDATION Recommended Devices: - 1x pair prefabricated PDAC approved diabetic shoes; Patient Selected - Apex A3200W Size 8.52M - 3x pair custom-to-patient PDAC approved vacuum formed diabetic insoles.  GOALS OF SHOES AND INSOLES - Reduce shear and pressure - Reduce / Prevent callus formation - Reduce / Prevent ulceration - Protect the fragile healing compromised diabetic foot.  Patient would benefit from diabetic shoes and inserts as patient has diabetes mellitus and the patient has one or more of the following conditions: - History of partial or complete amputation of the foot - History of previous foot ulceration. - History of pre-ulcerative callus - Peripheral neuropathy with evidence of callus formation - Foot deformity - Poor circulation  ACTIONS PERFORMED Patient was casted for insoles via crush box and measured for shoes via brannock device. Procedure was explained and patient tolerated procedure well. All questions were answered and concerns addressed.  PLAN Patient is to be contacted and scheduled for fitting once CMN is obtained from treaing physician and shoes and insoles have been fabricated and received.

## 2021-04-23 ENCOUNTER — Telehealth: Payer: Self-pay

## 2021-04-23 NOTE — Telephone Encounter (Signed)
Casts sent to central fabrication - HOLD FOR CMN °

## 2021-04-24 DIAGNOSIS — Z9181 History of falling: Secondary | ICD-10-CM | POA: Diagnosis not present

## 2021-04-24 DIAGNOSIS — Z7982 Long term (current) use of aspirin: Secondary | ICD-10-CM | POA: Diagnosis not present

## 2021-04-24 DIAGNOSIS — Z87891 Personal history of nicotine dependence: Secondary | ICD-10-CM | POA: Diagnosis not present

## 2021-04-24 DIAGNOSIS — F32A Depression, unspecified: Secondary | ICD-10-CM | POA: Diagnosis not present

## 2021-04-24 DIAGNOSIS — E119 Type 2 diabetes mellitus without complications: Secondary | ICD-10-CM | POA: Diagnosis not present

## 2021-04-24 DIAGNOSIS — E1142 Type 2 diabetes mellitus with diabetic polyneuropathy: Secondary | ICD-10-CM | POA: Diagnosis not present

## 2021-04-24 DIAGNOSIS — Z8673 Personal history of transient ischemic attack (TIA), and cerebral infarction without residual deficits: Secondary | ICD-10-CM | POA: Diagnosis not present

## 2021-04-24 DIAGNOSIS — I69354 Hemiplegia and hemiparesis following cerebral infarction affecting left non-dominant side: Secondary | ICD-10-CM | POA: Diagnosis not present

## 2021-04-24 DIAGNOSIS — Z794 Long term (current) use of insulin: Secondary | ICD-10-CM | POA: Diagnosis not present

## 2021-04-24 DIAGNOSIS — E039 Hypothyroidism, unspecified: Secondary | ICD-10-CM | POA: Diagnosis not present

## 2021-04-24 DIAGNOSIS — M24542 Contracture, left hand: Secondary | ICD-10-CM | POA: Diagnosis not present

## 2021-04-24 DIAGNOSIS — I10 Essential (primary) hypertension: Secondary | ICD-10-CM | POA: Diagnosis not present

## 2021-04-24 DIAGNOSIS — Z7902 Long term (current) use of antithrombotics/antiplatelets: Secondary | ICD-10-CM | POA: Diagnosis not present

## 2021-04-24 DIAGNOSIS — H5347 Heteronymous bilateral field defects: Secondary | ICD-10-CM | POA: Diagnosis not present

## 2021-04-24 DIAGNOSIS — H547 Unspecified visual loss: Secondary | ICD-10-CM | POA: Diagnosis not present

## 2021-04-24 DIAGNOSIS — G250 Essential tremor: Secondary | ICD-10-CM | POA: Diagnosis not present

## 2021-04-24 DIAGNOSIS — Z7985 Long-term (current) use of injectable non-insulin antidiabetic drugs: Secondary | ICD-10-CM | POA: Diagnosis not present

## 2021-04-24 DIAGNOSIS — R519 Headache, unspecified: Secondary | ICD-10-CM | POA: Diagnosis not present

## 2021-04-24 DIAGNOSIS — K5900 Constipation, unspecified: Secondary | ICD-10-CM | POA: Diagnosis not present

## 2021-04-24 DIAGNOSIS — E7800 Pure hypercholesterolemia, unspecified: Secondary | ICD-10-CM | POA: Diagnosis not present

## 2021-04-24 DIAGNOSIS — K219 Gastro-esophageal reflux disease without esophagitis: Secondary | ICD-10-CM | POA: Diagnosis not present

## 2021-04-24 DIAGNOSIS — K5090 Crohn's disease, unspecified, without complications: Secondary | ICD-10-CM | POA: Diagnosis not present

## 2021-04-24 DIAGNOSIS — R278 Other lack of coordination: Secondary | ICD-10-CM | POA: Diagnosis not present

## 2021-04-24 DIAGNOSIS — F410 Panic disorder [episodic paroxysmal anxiety] without agoraphobia: Secondary | ICD-10-CM | POA: Diagnosis not present

## 2021-04-26 DIAGNOSIS — E119 Type 2 diabetes mellitus without complications: Secondary | ICD-10-CM | POA: Diagnosis not present

## 2021-04-26 DIAGNOSIS — E7800 Pure hypercholesterolemia, unspecified: Secondary | ICD-10-CM | POA: Diagnosis not present

## 2021-04-26 DIAGNOSIS — I10 Essential (primary) hypertension: Secondary | ICD-10-CM | POA: Diagnosis not present

## 2021-04-26 DIAGNOSIS — Z7982 Long term (current) use of aspirin: Secondary | ICD-10-CM | POA: Diagnosis not present

## 2021-04-26 DIAGNOSIS — K219 Gastro-esophageal reflux disease without esophagitis: Secondary | ICD-10-CM | POA: Diagnosis not present

## 2021-04-26 DIAGNOSIS — Z7985 Long-term (current) use of injectable non-insulin antidiabetic drugs: Secondary | ICD-10-CM | POA: Diagnosis not present

## 2021-04-26 DIAGNOSIS — G250 Essential tremor: Secondary | ICD-10-CM | POA: Diagnosis not present

## 2021-04-26 DIAGNOSIS — Z87891 Personal history of nicotine dependence: Secondary | ICD-10-CM | POA: Diagnosis not present

## 2021-04-26 DIAGNOSIS — F410 Panic disorder [episodic paroxysmal anxiety] without agoraphobia: Secondary | ICD-10-CM | POA: Diagnosis not present

## 2021-04-26 DIAGNOSIS — Z794 Long term (current) use of insulin: Secondary | ICD-10-CM | POA: Diagnosis not present

## 2021-04-26 DIAGNOSIS — Z9181 History of falling: Secondary | ICD-10-CM | POA: Diagnosis not present

## 2021-04-26 DIAGNOSIS — K5900 Constipation, unspecified: Secondary | ICD-10-CM | POA: Diagnosis not present

## 2021-04-26 DIAGNOSIS — F32A Depression, unspecified: Secondary | ICD-10-CM | POA: Diagnosis not present

## 2021-04-26 DIAGNOSIS — M24542 Contracture, left hand: Secondary | ICD-10-CM | POA: Diagnosis not present

## 2021-04-26 DIAGNOSIS — I69354 Hemiplegia and hemiparesis following cerebral infarction affecting left non-dominant side: Secondary | ICD-10-CM | POA: Diagnosis not present

## 2021-04-26 DIAGNOSIS — E039 Hypothyroidism, unspecified: Secondary | ICD-10-CM | POA: Diagnosis not present

## 2021-04-26 DIAGNOSIS — H5347 Heteronymous bilateral field defects: Secondary | ICD-10-CM | POA: Diagnosis not present

## 2021-04-26 DIAGNOSIS — Z7902 Long term (current) use of antithrombotics/antiplatelets: Secondary | ICD-10-CM | POA: Diagnosis not present

## 2021-04-26 DIAGNOSIS — K5090 Crohn's disease, unspecified, without complications: Secondary | ICD-10-CM | POA: Diagnosis not present

## 2021-04-26 DIAGNOSIS — R278 Other lack of coordination: Secondary | ICD-10-CM | POA: Diagnosis not present

## 2021-04-28 DIAGNOSIS — M6281 Muscle weakness (generalized): Secondary | ICD-10-CM | POA: Diagnosis not present

## 2021-04-29 ENCOUNTER — Telehealth: Payer: Self-pay

## 2021-04-29 DIAGNOSIS — J431 Panlobular emphysema: Secondary | ICD-10-CM | POA: Diagnosis not present

## 2021-04-29 DIAGNOSIS — I700 Atherosclerosis of aorta: Secondary | ICD-10-CM | POA: Diagnosis not present

## 2021-04-29 DIAGNOSIS — E119 Type 2 diabetes mellitus without complications: Secondary | ICD-10-CM | POA: Diagnosis not present

## 2021-04-29 DIAGNOSIS — I639 Cerebral infarction, unspecified: Secondary | ICD-10-CM | POA: Diagnosis not present

## 2021-04-29 DIAGNOSIS — I480 Paroxysmal atrial fibrillation: Secondary | ICD-10-CM | POA: Diagnosis not present

## 2021-04-29 DIAGNOSIS — I10 Essential (primary) hypertension: Secondary | ICD-10-CM | POA: Diagnosis not present

## 2021-04-29 DIAGNOSIS — E7800 Pure hypercholesterolemia, unspecified: Secondary | ICD-10-CM | POA: Diagnosis not present

## 2021-04-29 NOTE — Telephone Encounter (Signed)
Returned patient's phonecall to answer her questions regarding brace design. Patient was concerned about having a locked ankle device and I let her know that the plastic and leather have a small amount of natural flex but to hold a drop foot from dragging, we cannot allow for plantarflexion, which is what most articulated AFOs will do. Patient is satisfied with answer and eagerly awaits her device's completion. All questions answered and concerns addressed. ?

## 2021-05-01 DIAGNOSIS — M24532 Contracture, left wrist: Secondary | ICD-10-CM | POA: Diagnosis not present

## 2021-05-01 DIAGNOSIS — I69354 Hemiplegia and hemiparesis following cerebral infarction affecting left non-dominant side: Secondary | ICD-10-CM | POA: Diagnosis not present

## 2021-05-01 DIAGNOSIS — H5347 Heteronymous bilateral field defects: Secondary | ICD-10-CM | POA: Diagnosis not present

## 2021-05-01 DIAGNOSIS — E039 Hypothyroidism, unspecified: Secondary | ICD-10-CM | POA: Diagnosis not present

## 2021-05-01 DIAGNOSIS — G250 Essential tremor: Secondary | ICD-10-CM | POA: Diagnosis not present

## 2021-05-01 DIAGNOSIS — R278 Other lack of coordination: Secondary | ICD-10-CM | POA: Diagnosis not present

## 2021-05-01 DIAGNOSIS — I10 Essential (primary) hypertension: Secondary | ICD-10-CM | POA: Diagnosis not present

## 2021-05-01 DIAGNOSIS — K5090 Crohn's disease, unspecified, without complications: Secondary | ICD-10-CM | POA: Diagnosis not present

## 2021-05-01 DIAGNOSIS — Z7982 Long term (current) use of aspirin: Secondary | ICD-10-CM | POA: Diagnosis not present

## 2021-05-01 DIAGNOSIS — Z87891 Personal history of nicotine dependence: Secondary | ICD-10-CM | POA: Diagnosis not present

## 2021-05-01 DIAGNOSIS — E119 Type 2 diabetes mellitus without complications: Secondary | ICD-10-CM | POA: Diagnosis not present

## 2021-05-01 DIAGNOSIS — M24522 Contracture, left elbow: Secondary | ICD-10-CM | POA: Diagnosis not present

## 2021-05-01 DIAGNOSIS — K5900 Constipation, unspecified: Secondary | ICD-10-CM | POA: Diagnosis not present

## 2021-05-01 DIAGNOSIS — F410 Panic disorder [episodic paroxysmal anxiety] without agoraphobia: Secondary | ICD-10-CM | POA: Diagnosis not present

## 2021-05-01 DIAGNOSIS — Z794 Long term (current) use of insulin: Secondary | ICD-10-CM | POA: Diagnosis not present

## 2021-05-01 DIAGNOSIS — Z7985 Long-term (current) use of injectable non-insulin antidiabetic drugs: Secondary | ICD-10-CM | POA: Diagnosis not present

## 2021-05-01 DIAGNOSIS — E7800 Pure hypercholesterolemia, unspecified: Secondary | ICD-10-CM | POA: Diagnosis not present

## 2021-05-01 DIAGNOSIS — Z9181 History of falling: Secondary | ICD-10-CM | POA: Diagnosis not present

## 2021-05-01 DIAGNOSIS — F32A Depression, unspecified: Secondary | ICD-10-CM | POA: Diagnosis not present

## 2021-05-01 DIAGNOSIS — M24542 Contracture, left hand: Secondary | ICD-10-CM | POA: Diagnosis not present

## 2021-05-01 DIAGNOSIS — Z7902 Long term (current) use of antithrombotics/antiplatelets: Secondary | ICD-10-CM | POA: Diagnosis not present

## 2021-05-01 DIAGNOSIS — K219 Gastro-esophageal reflux disease without esophagitis: Secondary | ICD-10-CM | POA: Diagnosis not present

## 2021-05-09 DIAGNOSIS — E119 Type 2 diabetes mellitus without complications: Secondary | ICD-10-CM | POA: Diagnosis not present

## 2021-05-09 DIAGNOSIS — E1169 Type 2 diabetes mellitus with other specified complication: Secondary | ICD-10-CM | POA: Diagnosis not present

## 2021-05-09 DIAGNOSIS — Z87891 Personal history of nicotine dependence: Secondary | ICD-10-CM | POA: Diagnosis not present

## 2021-05-09 DIAGNOSIS — M24542 Contracture, left hand: Secondary | ICD-10-CM | POA: Diagnosis not present

## 2021-05-09 DIAGNOSIS — F410 Panic disorder [episodic paroxysmal anxiety] without agoraphobia: Secondary | ICD-10-CM | POA: Diagnosis not present

## 2021-05-09 DIAGNOSIS — E7800 Pure hypercholesterolemia, unspecified: Secondary | ICD-10-CM | POA: Diagnosis not present

## 2021-05-09 DIAGNOSIS — E039 Hypothyroidism, unspecified: Secondary | ICD-10-CM | POA: Diagnosis not present

## 2021-05-09 DIAGNOSIS — E1142 Type 2 diabetes mellitus with diabetic polyneuropathy: Secondary | ICD-10-CM | POA: Diagnosis not present

## 2021-05-09 DIAGNOSIS — K219 Gastro-esophageal reflux disease without esophagitis: Secondary | ICD-10-CM | POA: Diagnosis not present

## 2021-05-09 DIAGNOSIS — H5347 Heteronymous bilateral field defects: Secondary | ICD-10-CM | POA: Diagnosis not present

## 2021-05-09 DIAGNOSIS — F32A Depression, unspecified: Secondary | ICD-10-CM | POA: Diagnosis not present

## 2021-05-09 DIAGNOSIS — I152 Hypertension secondary to endocrine disorders: Secondary | ICD-10-CM | POA: Diagnosis not present

## 2021-05-09 DIAGNOSIS — G250 Essential tremor: Secondary | ICD-10-CM | POA: Diagnosis not present

## 2021-05-09 DIAGNOSIS — I69354 Hemiplegia and hemiparesis following cerebral infarction affecting left non-dominant side: Secondary | ICD-10-CM | POA: Diagnosis not present

## 2021-05-09 DIAGNOSIS — Z794 Long term (current) use of insulin: Secondary | ICD-10-CM | POA: Diagnosis not present

## 2021-05-09 DIAGNOSIS — Z9181 History of falling: Secondary | ICD-10-CM | POA: Diagnosis not present

## 2021-05-09 DIAGNOSIS — K5090 Crohn's disease, unspecified, without complications: Secondary | ICD-10-CM | POA: Diagnosis not present

## 2021-05-09 DIAGNOSIS — Z7985 Long-term (current) use of injectable non-insulin antidiabetic drugs: Secondary | ICD-10-CM | POA: Diagnosis not present

## 2021-05-09 DIAGNOSIS — E1159 Type 2 diabetes mellitus with other circulatory complications: Secondary | ICD-10-CM | POA: Diagnosis not present

## 2021-05-09 DIAGNOSIS — Z7982 Long term (current) use of aspirin: Secondary | ICD-10-CM | POA: Diagnosis not present

## 2021-05-09 DIAGNOSIS — E785 Hyperlipidemia, unspecified: Secondary | ICD-10-CM | POA: Diagnosis not present

## 2021-05-09 DIAGNOSIS — R278 Other lack of coordination: Secondary | ICD-10-CM | POA: Diagnosis not present

## 2021-05-09 DIAGNOSIS — Z7902 Long term (current) use of antithrombotics/antiplatelets: Secondary | ICD-10-CM | POA: Diagnosis not present

## 2021-05-09 DIAGNOSIS — K5900 Constipation, unspecified: Secondary | ICD-10-CM | POA: Diagnosis not present

## 2021-05-09 DIAGNOSIS — I10 Essential (primary) hypertension: Secondary | ICD-10-CM | POA: Diagnosis not present

## 2021-05-10 ENCOUNTER — Encounter: Payer: Self-pay | Admitting: Podiatry

## 2021-05-10 ENCOUNTER — Other Ambulatory Visit: Payer: Self-pay

## 2021-05-10 ENCOUNTER — Ambulatory Visit: Payer: HMO | Admitting: Podiatry

## 2021-05-10 DIAGNOSIS — E1142 Type 2 diabetes mellitus with diabetic polyneuropathy: Secondary | ICD-10-CM

## 2021-05-10 DIAGNOSIS — M79674 Pain in right toe(s): Secondary | ICD-10-CM | POA: Diagnosis not present

## 2021-05-10 DIAGNOSIS — Z794 Long term (current) use of insulin: Secondary | ICD-10-CM | POA: Diagnosis not present

## 2021-05-10 DIAGNOSIS — M21372 Foot drop, left foot: Secondary | ICD-10-CM

## 2021-05-10 DIAGNOSIS — M79675 Pain in left toe(s): Secondary | ICD-10-CM | POA: Diagnosis not present

## 2021-05-10 DIAGNOSIS — B351 Tinea unguium: Secondary | ICD-10-CM

## 2021-05-10 NOTE — Progress Notes (Signed)
? ?HPI: 71 y.o. female presenting today PMHx diabetes mellitus with history of dropfoot left lower extremity presenting for evaluation of a symptomatic calluses developed to the left fifth toe.  Patient is also requesting a nail trim today.  Patient felt significant relief with debridement of the callus lesion last visit.  She has diabetic shoes and insoles with AFO modification pending. ? ?Past Medical History:  ?Diagnosis Date  ? Abdominal pain 12/06/2013  ? Abnormality of gait 01/26/2013  ? Altered bowel habits 12/11/2017  ? Bruises easily   ? Constipation   ? Crohn's disease ([place])   ? Depression   ? Dizziness and giddiness 01/26/2013  ? Fecal incontinence alternating with constipation 12/11/2017  ? GERD (gastroesophageal reflux disease)   ? Hemianopia   ? left  ? Hemiparesis and alteration of sensations as late effects of stroke ([place]) 01/26/2013  ? Hyperlipidemia   ? Hypertension   ? Hypothyroidism   ? Lump in female breast   ? Panic disorder   ? PONV (postoperative nausea and vomiting)   ? Stroke [facility]) 2009  ? Tremor, essential 04/24/2020  ? Type II diabetes mellitus ([place])   ? ? ?Past Surgical History:  ?Procedure Laterality Date  ? ANAL FISSURE REPAIR  2008  ? APPENDECTOMY  1986  ? BREAST BIOPSY Left 2006  ? neg  ? BREAST LUMPECTOMY Right 1990's  ? [place]; 1983  ? CHOLECYSTECTOMY  1986  ? SMALL INTESTINE SURGERY  05/1991  ? TUBAL LIGATION  1983  ? ? ?Allergies  ?Allergen Reactions  ? Codeine Other (See Comments)  ?  Syncope.  ? Morphine And Related Nausea And Vomiting and Other (See Comments)  ?  Hallucinations.  ? Baclofen Nausea Only and Other (See Comments)  ?  dizziness  ? Metformin Hcl Other (See Comments)  ? Penicillins Other (See Comments)  ?  Family history  ? Sulfamethoxazole Itching  ?  Hands itching   ? Elemental Sulfur Itching  ?  Per patient happened years ago.  ? ?  ?Physical Exam: ?General: The patient is alert and oriented x3 in no acute distress. ? ?Dermatology: Skin is warm, dry  and supple bilateral lower extremities. Negative for open lesions or macerations.  Hyperkeratotic symptomatic corn noted to the left fifth toe.  Hyperkeratotic elongated dystrophic nails also noted 1-5 bilateral ? ?Vascular: Palpable pedal pulses bilaterally. Capillary refill within normal limits.  Negative for any significant edema or erythema ? ?Neurological: Light touch and protective threshold grossly intact ? ?Musculoskeletal Exam: Dropfoot deformity left lower extremity with cavovarus deformity of the medial longitudinal arch bilateral ? ?Assessment: ?1.  Symptomatic callus/corn left fifth toe ?2.  History of dropfoot left lower extremity ?3.  Cavovarus left ? ?Plan of Care:  ?1. Patient evaluated. ?2.  Excisional debridement of the hyperkeratotic corn/callus was performed to the left fifth toe.  Patient felt immediate relief ?3.  Continue the AFO.  Modified AFO is pending  ?4.  Diabetic shoes and insoles pending ?5.  Mechanical debridement of nails 1-5 bilateral was also performed using a nail nipper without incident or bleeding.  Smoothed with a rotary bur.   ?6.  Return to clinic 3 months for routine foot care ? ?  ?  ?Edrick Kins, DPM ?[place] ? ?Dr. Edrick Kins, DPM  ?  ?[address]                                        ?  [place], [place] [address]                ?Office [phone]  ?Fax [phone] ? ? ? ? ?

## 2021-05-14 ENCOUNTER — Ambulatory Visit: Payer: HMO | Admitting: Podiatry

## 2021-05-14 DIAGNOSIS — I6389 Other cerebral infarction: Secondary | ICD-10-CM | POA: Diagnosis not present

## 2021-05-17 DIAGNOSIS — I4891 Unspecified atrial fibrillation: Secondary | ICD-10-CM | POA: Diagnosis not present

## 2021-05-17 DIAGNOSIS — Z7985 Long-term (current) use of injectable non-insulin antidiabetic drugs: Secondary | ICD-10-CM | POA: Diagnosis not present

## 2021-05-17 DIAGNOSIS — Z87891 Personal history of nicotine dependence: Secondary | ICD-10-CM | POA: Diagnosis not present

## 2021-05-17 DIAGNOSIS — Z993 Dependence on wheelchair: Secondary | ICD-10-CM | POA: Diagnosis not present

## 2021-05-17 DIAGNOSIS — E119 Type 2 diabetes mellitus without complications: Secondary | ICD-10-CM | POA: Diagnosis not present

## 2021-05-17 DIAGNOSIS — Z794 Long term (current) use of insulin: Secondary | ICD-10-CM | POA: Diagnosis not present

## 2021-05-17 DIAGNOSIS — I10 Essential (primary) hypertension: Secondary | ICD-10-CM | POA: Diagnosis not present

## 2021-05-17 DIAGNOSIS — I69354 Hemiplegia and hemiparesis following cerebral infarction affecting left non-dominant side: Secondary | ICD-10-CM | POA: Diagnosis not present

## 2021-05-20 ENCOUNTER — Other Ambulatory Visit: Payer: HMO

## 2021-05-21 DIAGNOSIS — E119 Type 2 diabetes mellitus without complications: Secondary | ICD-10-CM | POA: Diagnosis not present

## 2021-05-21 DIAGNOSIS — R3589 Other polyuria: Secondary | ICD-10-CM | POA: Diagnosis not present

## 2021-05-21 DIAGNOSIS — E7800 Pure hypercholesterolemia, unspecified: Secondary | ICD-10-CM | POA: Diagnosis not present

## 2021-05-21 DIAGNOSIS — Z794 Long term (current) use of insulin: Secondary | ICD-10-CM | POA: Diagnosis not present

## 2021-05-21 DIAGNOSIS — E039 Hypothyroidism, unspecified: Secondary | ICD-10-CM | POA: Diagnosis not present

## 2021-05-21 DIAGNOSIS — Z0000 Encounter for general adult medical examination without abnormal findings: Secondary | ICD-10-CM | POA: Diagnosis not present

## 2021-05-21 DIAGNOSIS — I69051 Hemiplegia and hemiparesis following nontraumatic subarachnoid hemorrhage affecting right dominant side: Secondary | ICD-10-CM | POA: Diagnosis not present

## 2021-05-22 DIAGNOSIS — I10 Essential (primary) hypertension: Secondary | ICD-10-CM | POA: Diagnosis not present

## 2021-05-22 DIAGNOSIS — I480 Paroxysmal atrial fibrillation: Secondary | ICD-10-CM | POA: Diagnosis not present

## 2021-05-29 DIAGNOSIS — M6281 Muscle weakness (generalized): Secondary | ICD-10-CM | POA: Diagnosis not present

## 2021-06-03 ENCOUNTER — Ambulatory Visit: Payer: HMO

## 2021-06-03 DIAGNOSIS — E1142 Type 2 diabetes mellitus with diabetic polyneuropathy: Secondary | ICD-10-CM

## 2021-06-03 DIAGNOSIS — M21372 Foot drop, left foot: Secondary | ICD-10-CM

## 2021-06-03 DIAGNOSIS — M216X2 Other acquired deformities of left foot: Secondary | ICD-10-CM

## 2021-06-03 DIAGNOSIS — I69359 Hemiplegia and hemiparesis following cerebral infarction affecting unspecified side: Secondary | ICD-10-CM

## 2021-06-03 NOTE — Progress Notes (Addendum)
SITUATION ?Reason for Visit: Dispensation and Fitting of Custom Molded Gauntlet ?Patient Report: Patient reports comfort in wear and understands all instructions. ? ?OBJECTIVE DATA ?Patient History / Diagnosis:   ?  ICD-10-CM   ?1. Type 2 diabetes mellitus with diabetic polyneuropathy, with long-term current use of insulin (HCC)  E11.42   ? Z79.4   ?  ?2. Foot drop, left  M21.372   ?  ?3. Hemiparesis and alteration of sensations as late effects of stroke [facility])  I69.359   ? A63.016   ?  ?4. Acquired cavovarus deformity of foot, left  M21.6X2   ?  ? ? ?Provided Device:  Custom Molded Gauntlet: Style [place] AFO ?    Production Number [id number] ? ?Goals of Orthosis: ?- Improve gait ?- Decrease energy expenditure during the gait cycle ?- Improve balance ?- Stabilize motion at ankle and subtalar joint ?- Compensate for muscle weakness ?- Facilitate motion ?- Provide triplanar ankle and foot stabilization for weight bearing activities ? ?Device Justification: ?- Patient is ambulatory  ?- Device is medically necessary as part of the overall treatment due to the patient's condition and related symptoms ?- It is anticipated that the patient will benefit functionally with use of the device.  ?- The custom device is utilized in an attempt to avoid the need for surgery and because a prefabricated device is inappropriate. ? ?The device appeared to be fitting well and the patient states that the device is comfortable. ? ?ACTIONS PERFORMED ?Patient was fit with Dance movement psychotherapist. Patient tolerated fitting procedure. Fit of the device is good. Patient was able to apply properly without distress. Device function is to restrict and limit motion and provide stabilization in the ankle joint.  ? ?Goals and function of this device were explained in detail to the patient. The patient was shown how to properly apply, wear, and care for the device. It was explained that the device will fit and function best in an  adjustable-closure shoe with a firm heel counter and a wide base of support. When the device was dispensed, it was suitable for the patient's condition and not substandard. No guarantees were given. Precautions were reviewed. Patient reports she will use the device in her [employer] shoes while waiting on diabetic shoes and insoles to be approved and fabricated. ? ?Written instructions, warranty information, and a copy of DMEPOS Supplier Standards were provided. All questions answered and concerns addressed. ? ?PLAN ?Patient is to follow up in one week or as necessary. Patient is to be contacted when diabetic shoes and insoles are ready. Plan of care was discussed with and agreed upon by patient. ? ? ?

## 2021-06-12 ENCOUNTER — Telehealth: Payer: Self-pay

## 2021-06-12 NOTE — Telephone Encounter (Signed)
Left voice message returning patient's phonecall ?

## 2021-06-12 NOTE — Telephone Encounter (Signed)
Patient called office to complain that she was under the impression that she was to obtain free diabetic shoes and insoles as recompence for her existing brace and shoes not functioning. Patient did receive a free custom AFO authorized by Dr. Celesta Gentile and supervisor Keely white. Patient was reminded that it was delivered and patient agrees it is functional and that she can use it in shoes other than the diabetic shoes she was last provided which were never intended to be used with an AFO.  ? ?Explained to patient that her insurance will cover shoes and insoles once per calendar year and that as it is a new calendar year I am obligated to bill her insurance for new shoes and insoles. This requires paperwork be sent to her treating physician. Patient had issue with the fact that CMN was faxed multiple times and no CMN was received in return. Patient wanted to know why her PCP was not contacted after the first fax not returning. Explained to patient that it is policy and procedure to attempt to fax before reaching out due to high call volume for both treating physicians and [place] and [place].  ? ?Patient began to use foul language to express her frustrations at me and my department. I calmly asked patient to calm down, reiterating that I want her problem resolved and am doing everything within my power to resolve it. Patient continued to curse and swear at me personally. I asked patient to please cease cursing at me or I will have to terminate the call. Patient declared this was unprofessional and that she would not be threatened. I wished her a good day, let her know we are doing everything to resolve her problem and terminated the call. ?

## 2021-06-13 ENCOUNTER — Ambulatory Visit: Payer: HMO | Admitting: Podiatry

## 2021-06-13 ENCOUNTER — Encounter: Payer: Self-pay | Admitting: Podiatry

## 2021-06-13 DIAGNOSIS — M779 Enthesopathy, unspecified: Secondary | ICD-10-CM | POA: Diagnosis not present

## 2021-06-13 DIAGNOSIS — M79675 Pain in left toe(s): Secondary | ICD-10-CM

## 2021-06-13 DIAGNOSIS — M79674 Pain in right toe(s): Secondary | ICD-10-CM | POA: Diagnosis not present

## 2021-06-13 DIAGNOSIS — B351 Tinea unguium: Secondary | ICD-10-CM

## 2021-06-13 NOTE — Progress Notes (Signed)
This patient presents to the office with pain noted on her fifth toe left foot. She says it is painful and a callus has developed.  She has tried pads previously but pain persist.  She also is interested in nail treatment for her fungus per Dr.  Amalia Hailey.  She is scheduled to see him soon.  She presents for evaluation of corn fifth toe left foot. ? ?Vascular  Dorsalis pedis and posterior tibial pulses are palpable  B/L.  Capillary return  WNL.  Temperature gradient is  WNL.  Skin turgor  WNL ? ?Sensorium  Senn Weinstein monofilament wire  WNL. Normal tactile sensation. ? ?Nail Exam  Patient has normal nails with no evidence of bacterial or fungal infection. ? ?Orthopedic  Exam  Muscle tone and muscle strength  WNL.  No limitations of motion feet  B/L.  No crepitus or joint effusion noted.  Foot type is unremarkable and digits show no abnormalities.  Thickness noted at PIPJ 5th toe left foot.  Bone  spur. ? ?Skin  No open lesions.  Normal skin texture and turgor. Patient has thick skin on the lateral aspect fifth toe but no evidence of corn noted. ? ?Bone spur. 5th toe left foot. ? ?ROV.  Discussed condition with patient padding applied after dremel usage.  To consider future amputation .  See Dr.  Amalia Hailey. ? ?Gardiner Barefoot DPM  ? ? ?

## 2021-06-28 DIAGNOSIS — M6281 Muscle weakness (generalized): Secondary | ICD-10-CM | POA: Diagnosis not present

## 2021-07-01 ENCOUNTER — Telehealth: Payer: Self-pay

## 2021-07-01 NOTE — Telephone Encounter (Signed)
Left voicemail letting patient know we have received the certificate of medical necessity and her shoes and inserts have been ordered and are being fabricated. They have been marked as rush and should be ready within the next two weeks. ?

## 2021-07-01 NOTE — Telephone Encounter (Signed)
Pt would like to speak with Aaron Edelman. She states she has called and he has not returned her call. She would not give me any details of what she need to speak to [place] about.  ? ?Please advise ?

## 2021-07-15 ENCOUNTER — Ambulatory Visit (INDEPENDENT_AMBULATORY_CARE_PROVIDER_SITE_OTHER): Payer: HMO

## 2021-07-15 DIAGNOSIS — E1142 Type 2 diabetes mellitus with diabetic polyneuropathy: Secondary | ICD-10-CM | POA: Diagnosis not present

## 2021-07-15 DIAGNOSIS — M216X2 Other acquired deformities of left foot: Secondary | ICD-10-CM

## 2021-07-15 DIAGNOSIS — Z794 Long term (current) use of insulin: Secondary | ICD-10-CM | POA: Diagnosis not present

## 2021-07-15 NOTE — Progress Notes (Signed)
SITUATION Reason for Visit: Fitting of Diabetic Shoes & Insoles Patient / Caregiver Report:  Patient is satisfied with fit and function of shoes and insoles.  OBJECTIVE DATA: Patient History / Diagnosis:     ICD-10-CM   1. Type 2 diabetes mellitus with diabetic polyneuropathy, with long-term current use of insulin (HCC)  E11.42    Z79.4     2. Acquired cavovarus deformity of foot, left  M21.6X2       Change in Status:   None  ACTIONS PERFORMED: In-Person Delivery, patient was fit with: - 1x pair A5500 PDAC approved prefabricated Diabetic Shoes: A3200W - 3x pair [id number] PDAC approved vacuum formed custom diabetic insoles; [employer]:  Shoes and insoles were verified for structural integrity and safety. Patient wore shoes and insoles in office. Skin was inspected and free of areas of concern after wearing shoes and inserts. Shoes and inserts fit properly. Patient / Caregiver provided with ferbal instruction and demonstration regarding donning, doffing, wear, care, proper fit, function, purpose, cleaning, and use of shoes and insoles ' and in all related precautions and risks and benefits regarding shoes and insoles. Patient / Caregiver was instructed to wear properly fitting socks with shoes at all times. Patient was also provided with verbal instruction regarding how to report any failures or malfunctions of shoes or inserts, and necessary follow up care. Patient / Caregiver was also instructed to contact physician regarding change in status that may affect function of shoes and inserts.   Patient / Caregiver verbalized undersatnding of instruction provided. Patient / Caregiver demonstrated independence with proper donning and doffing of shoes and inserts.  PLAN Patient to follow with treating physician as recommended. Plan of care was discussed with and agreed upon by patient and/or caregiver. All questions were answered and concerns addressed.

## 2021-07-16 ENCOUNTER — Encounter: Payer: Self-pay | Admitting: Podiatry

## 2021-07-16 ENCOUNTER — Ambulatory Visit (INDEPENDENT_AMBULATORY_CARE_PROVIDER_SITE_OTHER): Payer: HMO | Admitting: Podiatry

## 2021-07-16 DIAGNOSIS — M79674 Pain in right toe(s): Secondary | ICD-10-CM | POA: Diagnosis not present

## 2021-07-16 DIAGNOSIS — B351 Tinea unguium: Secondary | ICD-10-CM | POA: Diagnosis not present

## 2021-07-16 DIAGNOSIS — M21372 Foot drop, left foot: Secondary | ICD-10-CM

## 2021-07-16 DIAGNOSIS — M79675 Pain in left toe(s): Secondary | ICD-10-CM | POA: Diagnosis not present

## 2021-07-16 NOTE — Progress Notes (Signed)
HPI: 71 y.o. female presenting today PMHx diabetes mellitus with history of dropfoot left lower extremity presenting for evaluation of a symptomatic calluses developed to the left fifth toe.  Patient is also requesting a nail trim today.  Patient felt significant relief with debridement of the callus lesion last visit.  She has diabetic shoes and insoles with AFO modification  Past Medical History:  Diagnosis Date   Abdominal pain 12/06/2013   Abnormality of gait 01/26/2013   Altered bowel habits 12/11/2017   Bruises easily    Constipation    Crohn's disease ([place])    Depression    Dizziness and giddiness 01/26/2013   Fecal incontinence alternating with constipation 12/11/2017   GERD (gastroesophageal reflux disease)    Hemianopia    left   Hemiparesis and alteration of sensations as late effects of stroke ([place]) 01/26/2013   Hyperlipidemia    Hypertension    Hypothyroidism    Lump in female breast    Panic disorder    PONV (postoperative nausea and vomiting)    Stroke ([place]) 2009   Tremor, essential 04/24/2020   Type II diabetes mellitus ([place])     Past Surgical History:  Procedure Laterality Date   ANAL FISSURE REPAIR  2008   APPENDECTOMY  1986   BREAST BIOPSY Left 2006   neg   BREAST LUMPECTOMY Right 1990's   CESAREAN SECTION  1981; [place]  05/1991   TUBAL LIGATION  1983    Allergies  Allergen Reactions   Codeine Other (See Comments)    Syncope.   Morphine And Related Nausea And Vomiting and Other (See Comments)    Hallucinations.   Baclofen Nausea Only and Other (See Comments)    dizziness   Metformin Hcl Other (See Comments)   Penicillins Other (See Comments)    Family history   Sulfamethoxazole Itching    Hands itching    Elemental Sulfur Itching    Per patient happened years ago.     Physical Exam: General: The patient is alert and oriented x3 in no acute distress.  Dermatology: Skin is warm, dry and supple  bilateral lower extremities. Negative for open lesions or macerations.  Hyperkeratotic symptomatic corn noted to the left fifth toe.  Hyperkeratotic elongated dystrophic nails also noted 1-5 bilateral  Vascular: Palpable pedal pulses bilaterally. Capillary refill within normal limits.  Negative for any significant edema or erythema  Neurological: Light touch and protective threshold grossly intact  Musculoskeletal Exam: Dropfoot deformity left lower extremity with cavovarus deformity of the medial longitudinal arch bilateral  Assessment: 1.  Symptomatic callus/corn left fifth toe 2.  History of dropfoot left lower extremity 3.  Cavovarus left  Plan of Care:  1. Patient evaluated. 2.  Excisional debridement of the hyperkeratotic corn/callus was performed to the left fifth toe.  Patient felt immediate relief 3.  Continue the AFO.   4.  Continue diabetic shoes and insoles.  Patient recently had them dispensed 5.  Mechanical debridement of nails 1-5 bilateral was also performed using a nail nipper without incident or bleeding.  Smoothed with a rotary bur.   6.  Return to clinic 3 months for routine foot care      Edrick Kins, DPM [facility]  Dr. Edrick Kins, DPM    [address]  [place], [place] [address]                Office [phone]  Fax [phone]

## 2021-07-26 ENCOUNTER — Telehealth: Payer: Self-pay

## 2021-07-26 NOTE — Telephone Encounter (Signed)
Returned patient's phonecall and answered all questions. Recommended she proceed with physical therapy nwo that she has a brace

## 2021-07-29 DIAGNOSIS — E039 Hypothyroidism, unspecified: Secondary | ICD-10-CM | POA: Diagnosis not present

## 2021-07-29 DIAGNOSIS — M6281 Muscle weakness (generalized): Secondary | ICD-10-CM | POA: Diagnosis not present

## 2021-07-29 DIAGNOSIS — H53462 Homonymous bilateral field defects, left side: Secondary | ICD-10-CM | POA: Diagnosis not present

## 2021-07-29 DIAGNOSIS — I69354 Hemiplegia and hemiparesis following cerebral infarction affecting left non-dominant side: Secondary | ICD-10-CM | POA: Diagnosis not present

## 2021-07-29 DIAGNOSIS — I69398 Other sequelae of cerebral infarction: Secondary | ICD-10-CM | POA: Diagnosis not present

## 2021-07-29 DIAGNOSIS — E7800 Pure hypercholesterolemia, unspecified: Secondary | ICD-10-CM | POA: Diagnosis not present

## 2021-07-29 DIAGNOSIS — Z87891 Personal history of nicotine dependence: Secondary | ICD-10-CM | POA: Diagnosis not present

## 2021-07-29 DIAGNOSIS — E119 Type 2 diabetes mellitus without complications: Secondary | ICD-10-CM | POA: Diagnosis not present

## 2021-07-29 DIAGNOSIS — K219 Gastro-esophageal reflux disease without esophagitis: Secondary | ICD-10-CM | POA: Diagnosis not present

## 2021-07-29 DIAGNOSIS — M21372 Foot drop, left foot: Secondary | ICD-10-CM | POA: Diagnosis not present

## 2021-07-29 DIAGNOSIS — I10 Essential (primary) hypertension: Secondary | ICD-10-CM | POA: Diagnosis not present

## 2021-07-29 DIAGNOSIS — Z9181 History of falling: Secondary | ICD-10-CM | POA: Diagnosis not present

## 2021-07-29 DIAGNOSIS — K5090 Crohn's disease, unspecified, without complications: Secondary | ICD-10-CM | POA: Diagnosis not present

## 2021-07-29 DIAGNOSIS — R350 Frequency of micturition: Secondary | ICD-10-CM | POA: Diagnosis not present

## 2021-07-31 DIAGNOSIS — E7800 Pure hypercholesterolemia, unspecified: Secondary | ICD-10-CM | POA: Diagnosis not present

## 2021-07-31 DIAGNOSIS — E119 Type 2 diabetes mellitus without complications: Secondary | ICD-10-CM | POA: Diagnosis not present

## 2021-07-31 DIAGNOSIS — I10 Essential (primary) hypertension: Secondary | ICD-10-CM | POA: Diagnosis not present

## 2021-08-08 DIAGNOSIS — I69354 Hemiplegia and hemiparesis following cerebral infarction affecting left non-dominant side: Secondary | ICD-10-CM | POA: Diagnosis not present

## 2021-08-08 DIAGNOSIS — M21372 Foot drop, left foot: Secondary | ICD-10-CM | POA: Diagnosis not present

## 2021-08-08 DIAGNOSIS — K219 Gastro-esophageal reflux disease without esophagitis: Secondary | ICD-10-CM | POA: Diagnosis not present

## 2021-08-08 DIAGNOSIS — E119 Type 2 diabetes mellitus without complications: Secondary | ICD-10-CM | POA: Diagnosis not present

## 2021-08-08 DIAGNOSIS — R350 Frequency of micturition: Secondary | ICD-10-CM | POA: Diagnosis not present

## 2021-08-08 DIAGNOSIS — E7800 Pure hypercholesterolemia, unspecified: Secondary | ICD-10-CM | POA: Diagnosis not present

## 2021-08-08 DIAGNOSIS — E039 Hypothyroidism, unspecified: Secondary | ICD-10-CM | POA: Diagnosis not present

## 2021-08-08 DIAGNOSIS — I10 Essential (primary) hypertension: Secondary | ICD-10-CM | POA: Diagnosis not present

## 2021-08-08 DIAGNOSIS — H53462 Homonymous bilateral field defects, left side: Secondary | ICD-10-CM | POA: Diagnosis not present

## 2021-08-08 DIAGNOSIS — Z9181 History of falling: Secondary | ICD-10-CM | POA: Diagnosis not present

## 2021-08-08 DIAGNOSIS — K5090 Crohn's disease, unspecified, without complications: Secondary | ICD-10-CM | POA: Diagnosis not present

## 2021-08-08 DIAGNOSIS — I69398 Other sequelae of cerebral infarction: Secondary | ICD-10-CM | POA: Diagnosis not present

## 2021-08-08 DIAGNOSIS — Z87891 Personal history of nicotine dependence: Secondary | ICD-10-CM | POA: Diagnosis not present

## 2021-08-12 DIAGNOSIS — H53462 Homonymous bilateral field defects, left side: Secondary | ICD-10-CM | POA: Diagnosis not present

## 2021-08-12 DIAGNOSIS — I69354 Hemiplegia and hemiparesis following cerebral infarction affecting left non-dominant side: Secondary | ICD-10-CM | POA: Diagnosis not present

## 2021-08-12 DIAGNOSIS — I69398 Other sequelae of cerebral infarction: Secondary | ICD-10-CM | POA: Diagnosis not present

## 2021-08-12 DIAGNOSIS — E119 Type 2 diabetes mellitus without complications: Secondary | ICD-10-CM | POA: Diagnosis not present

## 2021-08-12 DIAGNOSIS — E039 Hypothyroidism, unspecified: Secondary | ICD-10-CM | POA: Diagnosis not present

## 2021-08-12 DIAGNOSIS — I10 Essential (primary) hypertension: Secondary | ICD-10-CM | POA: Diagnosis not present

## 2021-08-12 DIAGNOSIS — E7800 Pure hypercholesterolemia, unspecified: Secondary | ICD-10-CM | POA: Diagnosis not present

## 2021-08-13 DIAGNOSIS — E039 Hypothyroidism, unspecified: Secondary | ICD-10-CM | POA: Diagnosis not present

## 2021-08-13 DIAGNOSIS — Z87891 Personal history of nicotine dependence: Secondary | ICD-10-CM | POA: Diagnosis not present

## 2021-08-13 DIAGNOSIS — R350 Frequency of micturition: Secondary | ICD-10-CM | POA: Diagnosis not present

## 2021-08-13 DIAGNOSIS — K219 Gastro-esophageal reflux disease without esophagitis: Secondary | ICD-10-CM | POA: Diagnosis not present

## 2021-08-13 DIAGNOSIS — M21372 Foot drop, left foot: Secondary | ICD-10-CM | POA: Diagnosis not present

## 2021-08-13 DIAGNOSIS — I69354 Hemiplegia and hemiparesis following cerebral infarction affecting left non-dominant side: Secondary | ICD-10-CM | POA: Diagnosis not present

## 2021-08-13 DIAGNOSIS — H53462 Homonymous bilateral field defects, left side: Secondary | ICD-10-CM | POA: Diagnosis not present

## 2021-08-13 DIAGNOSIS — K5090 Crohn's disease, unspecified, without complications: Secondary | ICD-10-CM | POA: Diagnosis not present

## 2021-08-13 DIAGNOSIS — E7800 Pure hypercholesterolemia, unspecified: Secondary | ICD-10-CM | POA: Diagnosis not present

## 2021-08-13 DIAGNOSIS — I69398 Other sequelae of cerebral infarction: Secondary | ICD-10-CM | POA: Diagnosis not present

## 2021-08-13 DIAGNOSIS — E119 Type 2 diabetes mellitus without complications: Secondary | ICD-10-CM | POA: Diagnosis not present

## 2021-08-13 DIAGNOSIS — I10 Essential (primary) hypertension: Secondary | ICD-10-CM | POA: Diagnosis not present

## 2021-08-13 DIAGNOSIS — Z9181 History of falling: Secondary | ICD-10-CM | POA: Diagnosis not present

## 2021-08-15 DIAGNOSIS — K219 Gastro-esophageal reflux disease without esophagitis: Secondary | ICD-10-CM | POA: Diagnosis not present

## 2021-08-15 DIAGNOSIS — H53462 Homonymous bilateral field defects, left side: Secondary | ICD-10-CM | POA: Diagnosis not present

## 2021-08-15 DIAGNOSIS — I69354 Hemiplegia and hemiparesis following cerebral infarction affecting left non-dominant side: Secondary | ICD-10-CM | POA: Diagnosis not present

## 2021-08-15 DIAGNOSIS — R350 Frequency of micturition: Secondary | ICD-10-CM | POA: Diagnosis not present

## 2021-08-15 DIAGNOSIS — M21372 Foot drop, left foot: Secondary | ICD-10-CM | POA: Diagnosis not present

## 2021-08-15 DIAGNOSIS — I10 Essential (primary) hypertension: Secondary | ICD-10-CM | POA: Diagnosis not present

## 2021-08-15 DIAGNOSIS — E119 Type 2 diabetes mellitus without complications: Secondary | ICD-10-CM | POA: Diagnosis not present

## 2021-08-15 DIAGNOSIS — E039 Hypothyroidism, unspecified: Secondary | ICD-10-CM | POA: Diagnosis not present

## 2021-08-15 DIAGNOSIS — E7800 Pure hypercholesterolemia, unspecified: Secondary | ICD-10-CM | POA: Diagnosis not present

## 2021-08-15 DIAGNOSIS — K5090 Crohn's disease, unspecified, without complications: Secondary | ICD-10-CM | POA: Diagnosis not present

## 2021-08-15 DIAGNOSIS — Z87891 Personal history of nicotine dependence: Secondary | ICD-10-CM | POA: Diagnosis not present

## 2021-08-15 DIAGNOSIS — Z9181 History of falling: Secondary | ICD-10-CM | POA: Diagnosis not present

## 2021-08-15 DIAGNOSIS — I69398 Other sequelae of cerebral infarction: Secondary | ICD-10-CM | POA: Diagnosis not present

## 2021-08-19 DIAGNOSIS — I69398 Other sequelae of cerebral infarction: Secondary | ICD-10-CM | POA: Diagnosis not present

## 2021-08-19 DIAGNOSIS — I69354 Hemiplegia and hemiparesis following cerebral infarction affecting left non-dominant side: Secondary | ICD-10-CM | POA: Diagnosis not present

## 2021-08-19 DIAGNOSIS — R350 Frequency of micturition: Secondary | ICD-10-CM | POA: Diagnosis not present

## 2021-08-19 DIAGNOSIS — K5090 Crohn's disease, unspecified, without complications: Secondary | ICD-10-CM | POA: Diagnosis not present

## 2021-08-19 DIAGNOSIS — E119 Type 2 diabetes mellitus without complications: Secondary | ICD-10-CM | POA: Diagnosis not present

## 2021-08-19 DIAGNOSIS — H53462 Homonymous bilateral field defects, left side: Secondary | ICD-10-CM | POA: Diagnosis not present

## 2021-08-19 DIAGNOSIS — Z87891 Personal history of nicotine dependence: Secondary | ICD-10-CM | POA: Diagnosis not present

## 2021-08-19 DIAGNOSIS — K219 Gastro-esophageal reflux disease without esophagitis: Secondary | ICD-10-CM | POA: Diagnosis not present

## 2021-08-19 DIAGNOSIS — Z9181 History of falling: Secondary | ICD-10-CM | POA: Diagnosis not present

## 2021-08-19 DIAGNOSIS — I10 Essential (primary) hypertension: Secondary | ICD-10-CM | POA: Diagnosis not present

## 2021-08-19 DIAGNOSIS — E7800 Pure hypercholesterolemia, unspecified: Secondary | ICD-10-CM | POA: Diagnosis not present

## 2021-08-19 DIAGNOSIS — M21372 Foot drop, left foot: Secondary | ICD-10-CM | POA: Diagnosis not present

## 2021-08-19 DIAGNOSIS — E039 Hypothyroidism, unspecified: Secondary | ICD-10-CM | POA: Diagnosis not present

## 2021-08-20 DIAGNOSIS — R200 Anesthesia of skin: Secondary | ICD-10-CM | POA: Diagnosis not present

## 2021-08-20 DIAGNOSIS — E1142 Type 2 diabetes mellitus with diabetic polyneuropathy: Secondary | ICD-10-CM | POA: Diagnosis not present

## 2021-08-20 DIAGNOSIS — R202 Paresthesia of skin: Secondary | ICD-10-CM | POA: Diagnosis not present

## 2021-08-20 DIAGNOSIS — I69359 Hemiplegia and hemiparesis following cerebral infarction affecting unspecified side: Secondary | ICD-10-CM | POA: Diagnosis not present

## 2021-08-20 DIAGNOSIS — G250 Essential tremor: Secondary | ICD-10-CM | POA: Diagnosis not present

## 2021-08-20 DIAGNOSIS — H547 Unspecified visual loss: Secondary | ICD-10-CM | POA: Diagnosis not present

## 2021-08-20 DIAGNOSIS — R760 Raised antibody titer: Secondary | ICD-10-CM | POA: Diagnosis not present

## 2021-08-20 DIAGNOSIS — Z8673 Personal history of transient ischemic attack (TIA), and cerebral infarction without residual deficits: Secondary | ICD-10-CM | POA: Diagnosis not present

## 2021-08-20 DIAGNOSIS — Z794 Long term (current) use of insulin: Secondary | ICD-10-CM | POA: Diagnosis not present

## 2021-08-20 DIAGNOSIS — R519 Headache, unspecified: Secondary | ICD-10-CM | POA: Diagnosis not present

## 2021-08-20 DIAGNOSIS — R269 Unspecified abnormalities of gait and mobility: Secondary | ICD-10-CM | POA: Diagnosis not present

## 2021-08-26 DIAGNOSIS — Z87891 Personal history of nicotine dependence: Secondary | ICD-10-CM | POA: Diagnosis not present

## 2021-08-26 DIAGNOSIS — K219 Gastro-esophageal reflux disease without esophagitis: Secondary | ICD-10-CM | POA: Diagnosis not present

## 2021-08-26 DIAGNOSIS — H53462 Homonymous bilateral field defects, left side: Secondary | ICD-10-CM | POA: Diagnosis not present

## 2021-08-26 DIAGNOSIS — I69354 Hemiplegia and hemiparesis following cerebral infarction affecting left non-dominant side: Secondary | ICD-10-CM | POA: Diagnosis not present

## 2021-08-26 DIAGNOSIS — Z9181 History of falling: Secondary | ICD-10-CM | POA: Diagnosis not present

## 2021-08-26 DIAGNOSIS — K5090 Crohn's disease, unspecified, without complications: Secondary | ICD-10-CM | POA: Diagnosis not present

## 2021-08-26 DIAGNOSIS — E7800 Pure hypercholesterolemia, unspecified: Secondary | ICD-10-CM | POA: Diagnosis not present

## 2021-08-26 DIAGNOSIS — R350 Frequency of micturition: Secondary | ICD-10-CM | POA: Diagnosis not present

## 2021-08-26 DIAGNOSIS — E119 Type 2 diabetes mellitus without complications: Secondary | ICD-10-CM | POA: Diagnosis not present

## 2021-08-26 DIAGNOSIS — I10 Essential (primary) hypertension: Secondary | ICD-10-CM | POA: Diagnosis not present

## 2021-08-26 DIAGNOSIS — M21372 Foot drop, left foot: Secondary | ICD-10-CM | POA: Diagnosis not present

## 2021-08-26 DIAGNOSIS — I69398 Other sequelae of cerebral infarction: Secondary | ICD-10-CM | POA: Diagnosis not present

## 2021-08-26 DIAGNOSIS — E039 Hypothyroidism, unspecified: Secondary | ICD-10-CM | POA: Diagnosis not present

## 2021-08-28 DIAGNOSIS — M6281 Muscle weakness (generalized): Secondary | ICD-10-CM | POA: Diagnosis not present

## 2021-09-10 ENCOUNTER — Ambulatory Visit (INDEPENDENT_AMBULATORY_CARE_PROVIDER_SITE_OTHER): Payer: PPO | Admitting: Podiatry

## 2021-09-10 DIAGNOSIS — B351 Tinea unguium: Secondary | ICD-10-CM | POA: Diagnosis not present

## 2021-09-10 DIAGNOSIS — M79674 Pain in right toe(s): Secondary | ICD-10-CM

## 2021-09-10 DIAGNOSIS — M79675 Pain in left toe(s): Secondary | ICD-10-CM | POA: Diagnosis not present

## 2021-09-10 NOTE — Progress Notes (Signed)
Chief Complaint  Patient presents with   foot care    Diabetic foot care    SUBJECTIVE Patient with a history of diabetes mellitus minimally ambulatory presenting today in a wheelchair presents to office today complaining of elongated, thickened nails that cause pain while ambulating in shoes.  Patient is unable to trim their own nails. Patient is here for further evaluation and treatment.   Past Medical History:  Diagnosis Date   Abdominal pain 12/06/2013   Abnormality of gait 01/26/2013   Altered bowel habits 12/11/2017   Bruises easily    Constipation    Crohn's disease ([place])    Depression    Dizziness and giddiness 01/26/2013   Fecal incontinence alternating with constipation 12/11/2017   GERD (gastroesophageal reflux disease)    Hemianopia    left   Hemiparesis and alteration of sensations as late effects of stroke ([place]) 01/26/2013   Hyperlipidemia    Hypertension    Hypothyroidism    Lump in female breast    Panic disorder    PONV (postoperative nausea and vomiting)    Stroke ([place]) 2009   Tremor, essential 04/24/2020   Type II diabetes mellitus ([place])    Past Surgical History:  Procedure Laterality Date   ANAL FISSURE REPAIR  2008   APPENDECTOMY  1986   BREAST BIOPSY Left 2006   neg   BREAST LUMPECTOMY Right 1990's   CESAREAN SECTION  1981; [place]  05/1991   TUBAL LIGATION  1983   Allergies  Allergen Reactions   Codeine Other (See Comments)    Syncope.   Morphine And Related Nausea And Vomiting and Other (See Comments)    Hallucinations.   Baclofen Nausea Only and Other (See Comments)    dizziness   Metformin Hcl Other (See Comments)   Penicillins Other (See Comments)    Family history   Sulfamethoxazole Itching    Hands itching    Elemental Sulfur Itching    Per patient happened years ago.     OBJECTIVE General Patient is awake, alert, and oriented x 3 and in no acute distress. Derm Skin is dry and  supple bilateral. Negative open lesions or macerations. Remaining integument unremarkable. Nails are tender, long, thickened and dystrophic with subungual debris, consistent with onychomycosis, 1-5 bilateral. No signs of infection noted. Vasc  DP and PT pedal pulses palpable bilaterally. Temperature gradient within normal limits.  Neuro Epicritic and protective threshold sensation diminished bilaterally.  Musculoskeletal Exam No symptomatic pedal deformities noted bilateral. Muscular strength within normal limits.  ASSESSMENT 1. Diabetes Mellitus w/ peripheral neuropathy 2.  Pain due to onychomycosis of toenails bilateral  PLAN OF CARE 1. Patient evaluated today. 2. Instructed to maintain good pedal hygiene and foot care. Stressed importance of controlling blood sugar.  3. Mechanical debridement of nails 1-5 bilaterally performed using a nail nipper. Filed with dremel without incident.  4. Return to clinic in 3 mos.     Edrick Kins, DPM [facility]  Dr. Edrick Kins, DPM    [address], [place] [address]                Office [phone]  Fax [phone]

## 2021-09-11 DIAGNOSIS — E039 Hypothyroidism, unspecified: Secondary | ICD-10-CM | POA: Diagnosis not present

## 2021-09-11 DIAGNOSIS — M21372 Foot drop, left foot: Secondary | ICD-10-CM | POA: Diagnosis not present

## 2021-09-11 DIAGNOSIS — H53462 Homonymous bilateral field defects, left side: Secondary | ICD-10-CM | POA: Diagnosis not present

## 2021-09-11 DIAGNOSIS — I69354 Hemiplegia and hemiparesis following cerebral infarction affecting left non-dominant side: Secondary | ICD-10-CM | POA: Diagnosis not present

## 2021-09-11 DIAGNOSIS — I69398 Other sequelae of cerebral infarction: Secondary | ICD-10-CM | POA: Diagnosis not present

## 2021-09-11 DIAGNOSIS — E119 Type 2 diabetes mellitus without complications: Secondary | ICD-10-CM | POA: Diagnosis not present

## 2021-09-11 DIAGNOSIS — I10 Essential (primary) hypertension: Secondary | ICD-10-CM | POA: Diagnosis not present

## 2021-09-11 DIAGNOSIS — K5090 Crohn's disease, unspecified, without complications: Secondary | ICD-10-CM | POA: Diagnosis not present

## 2021-09-11 DIAGNOSIS — R350 Frequency of micturition: Secondary | ICD-10-CM | POA: Diagnosis not present

## 2021-09-11 DIAGNOSIS — Z87891 Personal history of nicotine dependence: Secondary | ICD-10-CM | POA: Diagnosis not present

## 2021-09-11 DIAGNOSIS — Z9181 History of falling: Secondary | ICD-10-CM | POA: Diagnosis not present

## 2021-09-11 DIAGNOSIS — E7800 Pure hypercholesterolemia, unspecified: Secondary | ICD-10-CM | POA: Diagnosis not present

## 2021-09-11 DIAGNOSIS — K219 Gastro-esophageal reflux disease without esophagitis: Secondary | ICD-10-CM | POA: Diagnosis not present

## 2021-09-17 ENCOUNTER — Encounter: Payer: Self-pay | Admitting: Podiatry

## 2021-09-18 DIAGNOSIS — H53462 Homonymous bilateral field defects, left side: Secondary | ICD-10-CM | POA: Diagnosis not present

## 2021-09-18 DIAGNOSIS — E119 Type 2 diabetes mellitus without complications: Secondary | ICD-10-CM | POA: Diagnosis not present

## 2021-09-18 DIAGNOSIS — Z87891 Personal history of nicotine dependence: Secondary | ICD-10-CM | POA: Diagnosis not present

## 2021-09-18 DIAGNOSIS — Z9181 History of falling: Secondary | ICD-10-CM | POA: Diagnosis not present

## 2021-09-18 DIAGNOSIS — I69398 Other sequelae of cerebral infarction: Secondary | ICD-10-CM | POA: Diagnosis not present

## 2021-09-18 DIAGNOSIS — E039 Hypothyroidism, unspecified: Secondary | ICD-10-CM | POA: Diagnosis not present

## 2021-09-18 DIAGNOSIS — K219 Gastro-esophageal reflux disease without esophagitis: Secondary | ICD-10-CM | POA: Diagnosis not present

## 2021-09-18 DIAGNOSIS — M21372 Foot drop, left foot: Secondary | ICD-10-CM | POA: Diagnosis not present

## 2021-09-18 DIAGNOSIS — I10 Essential (primary) hypertension: Secondary | ICD-10-CM | POA: Diagnosis not present

## 2021-09-18 DIAGNOSIS — K5090 Crohn's disease, unspecified, without complications: Secondary | ICD-10-CM | POA: Diagnosis not present

## 2021-09-18 DIAGNOSIS — E7800 Pure hypercholesterolemia, unspecified: Secondary | ICD-10-CM | POA: Diagnosis not present

## 2021-09-18 DIAGNOSIS — R350 Frequency of micturition: Secondary | ICD-10-CM | POA: Diagnosis not present

## 2021-09-18 DIAGNOSIS — I69354 Hemiplegia and hemiparesis following cerebral infarction affecting left non-dominant side: Secondary | ICD-10-CM | POA: Diagnosis not present

## 2021-09-23 DIAGNOSIS — Z87891 Personal history of nicotine dependence: Secondary | ICD-10-CM | POA: Diagnosis not present

## 2021-09-23 DIAGNOSIS — M21372 Foot drop, left foot: Secondary | ICD-10-CM | POA: Diagnosis not present

## 2021-09-23 DIAGNOSIS — R350 Frequency of micturition: Secondary | ICD-10-CM | POA: Diagnosis not present

## 2021-09-23 DIAGNOSIS — E119 Type 2 diabetes mellitus without complications: Secondary | ICD-10-CM | POA: Diagnosis not present

## 2021-09-23 DIAGNOSIS — I10 Essential (primary) hypertension: Secondary | ICD-10-CM | POA: Diagnosis not present

## 2021-09-23 DIAGNOSIS — H53462 Homonymous bilateral field defects, left side: Secondary | ICD-10-CM | POA: Diagnosis not present

## 2021-09-23 DIAGNOSIS — I69354 Hemiplegia and hemiparesis following cerebral infarction affecting left non-dominant side: Secondary | ICD-10-CM | POA: Diagnosis not present

## 2021-09-23 DIAGNOSIS — K5090 Crohn's disease, unspecified, without complications: Secondary | ICD-10-CM | POA: Diagnosis not present

## 2021-09-23 DIAGNOSIS — I69398 Other sequelae of cerebral infarction: Secondary | ICD-10-CM | POA: Diagnosis not present

## 2021-09-23 DIAGNOSIS — Z9181 History of falling: Secondary | ICD-10-CM | POA: Diagnosis not present

## 2021-09-23 DIAGNOSIS — K219 Gastro-esophageal reflux disease without esophagitis: Secondary | ICD-10-CM | POA: Diagnosis not present

## 2021-09-23 DIAGNOSIS — E039 Hypothyroidism, unspecified: Secondary | ICD-10-CM | POA: Diagnosis not present

## 2021-09-23 DIAGNOSIS — E7800 Pure hypercholesterolemia, unspecified: Secondary | ICD-10-CM | POA: Diagnosis not present

## 2021-09-25 DIAGNOSIS — E1142 Type 2 diabetes mellitus with diabetic polyneuropathy: Secondary | ICD-10-CM | POA: Diagnosis not present

## 2021-09-25 DIAGNOSIS — Z794 Long term (current) use of insulin: Secondary | ICD-10-CM | POA: Diagnosis not present

## 2021-09-25 DIAGNOSIS — E559 Vitamin D deficiency, unspecified: Secondary | ICD-10-CM | POA: Diagnosis not present

## 2021-09-25 DIAGNOSIS — E785 Hyperlipidemia, unspecified: Secondary | ICD-10-CM | POA: Diagnosis not present

## 2021-09-25 DIAGNOSIS — E1159 Type 2 diabetes mellitus with other circulatory complications: Secondary | ICD-10-CM | POA: Diagnosis not present

## 2021-09-25 DIAGNOSIS — E1169 Type 2 diabetes mellitus with other specified complication: Secondary | ICD-10-CM | POA: Diagnosis not present

## 2021-09-25 DIAGNOSIS — I152 Hypertension secondary to endocrine disorders: Secondary | ICD-10-CM | POA: Diagnosis not present

## 2021-09-28 DIAGNOSIS — M6281 Muscle weakness (generalized): Secondary | ICD-10-CM | POA: Diagnosis not present

## 2021-10-02 DIAGNOSIS — Z7985 Long-term (current) use of injectable non-insulin antidiabetic drugs: Secondary | ICD-10-CM | POA: Diagnosis not present

## 2021-10-02 DIAGNOSIS — H53462 Homonymous bilateral field defects, left side: Secondary | ICD-10-CM | POA: Diagnosis not present

## 2021-10-02 DIAGNOSIS — R350 Frequency of micturition: Secondary | ICD-10-CM | POA: Diagnosis not present

## 2021-10-02 DIAGNOSIS — Z794 Long term (current) use of insulin: Secondary | ICD-10-CM | POA: Diagnosis not present

## 2021-10-02 DIAGNOSIS — Z87891 Personal history of nicotine dependence: Secondary | ICD-10-CM | POA: Diagnosis not present

## 2021-10-02 DIAGNOSIS — M21372 Foot drop, left foot: Secondary | ICD-10-CM | POA: Diagnosis not present

## 2021-10-02 DIAGNOSIS — Z7982 Long term (current) use of aspirin: Secondary | ICD-10-CM | POA: Diagnosis not present

## 2021-10-02 DIAGNOSIS — E039 Hypothyroidism, unspecified: Secondary | ICD-10-CM | POA: Diagnosis not present

## 2021-10-02 DIAGNOSIS — E119 Type 2 diabetes mellitus without complications: Secondary | ICD-10-CM | POA: Diagnosis not present

## 2021-10-02 DIAGNOSIS — Z9181 History of falling: Secondary | ICD-10-CM | POA: Diagnosis not present

## 2021-10-02 DIAGNOSIS — Z7902 Long term (current) use of antithrombotics/antiplatelets: Secondary | ICD-10-CM | POA: Diagnosis not present

## 2021-10-02 DIAGNOSIS — I69398 Other sequelae of cerebral infarction: Secondary | ICD-10-CM | POA: Diagnosis not present

## 2021-10-02 DIAGNOSIS — E7800 Pure hypercholesterolemia, unspecified: Secondary | ICD-10-CM | POA: Diagnosis not present

## 2021-10-02 DIAGNOSIS — K219 Gastro-esophageal reflux disease without esophagitis: Secondary | ICD-10-CM | POA: Diagnosis not present

## 2021-10-02 DIAGNOSIS — K5090 Crohn's disease, unspecified, without complications: Secondary | ICD-10-CM | POA: Diagnosis not present

## 2021-10-02 DIAGNOSIS — I69354 Hemiplegia and hemiparesis following cerebral infarction affecting left non-dominant side: Secondary | ICD-10-CM | POA: Diagnosis not present

## 2021-10-02 DIAGNOSIS — I10 Essential (primary) hypertension: Secondary | ICD-10-CM | POA: Diagnosis not present

## 2021-10-15 DIAGNOSIS — I10 Essential (primary) hypertension: Secondary | ICD-10-CM | POA: Diagnosis not present

## 2021-10-15 DIAGNOSIS — E7800 Pure hypercholesterolemia, unspecified: Secondary | ICD-10-CM | POA: Diagnosis not present

## 2021-10-15 DIAGNOSIS — I69354 Hemiplegia and hemiparesis following cerebral infarction affecting left non-dominant side: Secondary | ICD-10-CM | POA: Diagnosis not present

## 2021-10-15 DIAGNOSIS — K5090 Crohn's disease, unspecified, without complications: Secondary | ICD-10-CM | POA: Diagnosis not present

## 2021-10-15 DIAGNOSIS — E039 Hypothyroidism, unspecified: Secondary | ICD-10-CM | POA: Diagnosis not present

## 2021-10-15 DIAGNOSIS — M21372 Foot drop, left foot: Secondary | ICD-10-CM | POA: Diagnosis not present

## 2021-10-15 DIAGNOSIS — K219 Gastro-esophageal reflux disease without esophagitis: Secondary | ICD-10-CM | POA: Diagnosis not present

## 2021-10-15 DIAGNOSIS — Z794 Long term (current) use of insulin: Secondary | ICD-10-CM | POA: Diagnosis not present

## 2021-10-15 DIAGNOSIS — Z7982 Long term (current) use of aspirin: Secondary | ICD-10-CM | POA: Diagnosis not present

## 2021-10-15 DIAGNOSIS — I69398 Other sequelae of cerebral infarction: Secondary | ICD-10-CM | POA: Diagnosis not present

## 2021-10-15 DIAGNOSIS — Z7902 Long term (current) use of antithrombotics/antiplatelets: Secondary | ICD-10-CM | POA: Diagnosis not present

## 2021-10-15 DIAGNOSIS — R350 Frequency of micturition: Secondary | ICD-10-CM | POA: Diagnosis not present

## 2021-10-15 DIAGNOSIS — Z87891 Personal history of nicotine dependence: Secondary | ICD-10-CM | POA: Diagnosis not present

## 2021-10-15 DIAGNOSIS — H53462 Homonymous bilateral field defects, left side: Secondary | ICD-10-CM | POA: Diagnosis not present

## 2021-10-15 DIAGNOSIS — Z7985 Long-term (current) use of injectable non-insulin antidiabetic drugs: Secondary | ICD-10-CM | POA: Diagnosis not present

## 2021-10-15 DIAGNOSIS — E119 Type 2 diabetes mellitus without complications: Secondary | ICD-10-CM | POA: Diagnosis not present

## 2021-10-15 DIAGNOSIS — Z9181 History of falling: Secondary | ICD-10-CM | POA: Diagnosis not present

## 2021-10-18 ENCOUNTER — Encounter: Payer: Self-pay | Admitting: Podiatry

## 2021-10-23 DIAGNOSIS — Z7985 Long-term (current) use of injectable non-insulin antidiabetic drugs: Secondary | ICD-10-CM | POA: Diagnosis not present

## 2021-10-23 DIAGNOSIS — I10 Essential (primary) hypertension: Secondary | ICD-10-CM | POA: Diagnosis not present

## 2021-10-23 DIAGNOSIS — R350 Frequency of micturition: Secondary | ICD-10-CM | POA: Diagnosis not present

## 2021-10-23 DIAGNOSIS — K219 Gastro-esophageal reflux disease without esophagitis: Secondary | ICD-10-CM | POA: Diagnosis not present

## 2021-10-23 DIAGNOSIS — Z794 Long term (current) use of insulin: Secondary | ICD-10-CM | POA: Diagnosis not present

## 2021-10-23 DIAGNOSIS — I69398 Other sequelae of cerebral infarction: Secondary | ICD-10-CM | POA: Diagnosis not present

## 2021-10-23 DIAGNOSIS — K5090 Crohn's disease, unspecified, without complications: Secondary | ICD-10-CM | POA: Diagnosis not present

## 2021-10-23 DIAGNOSIS — Z9181 History of falling: Secondary | ICD-10-CM | POA: Diagnosis not present

## 2021-10-23 DIAGNOSIS — I69354 Hemiplegia and hemiparesis following cerebral infarction affecting left non-dominant side: Secondary | ICD-10-CM | POA: Diagnosis not present

## 2021-10-23 DIAGNOSIS — Z7902 Long term (current) use of antithrombotics/antiplatelets: Secondary | ICD-10-CM | POA: Diagnosis not present

## 2021-10-23 DIAGNOSIS — Z7982 Long term (current) use of aspirin: Secondary | ICD-10-CM | POA: Diagnosis not present

## 2021-10-23 DIAGNOSIS — Z87891 Personal history of nicotine dependence: Secondary | ICD-10-CM | POA: Diagnosis not present

## 2021-10-23 DIAGNOSIS — H53462 Homonymous bilateral field defects, left side: Secondary | ICD-10-CM | POA: Diagnosis not present

## 2021-10-23 DIAGNOSIS — E039 Hypothyroidism, unspecified: Secondary | ICD-10-CM | POA: Diagnosis not present

## 2021-10-23 DIAGNOSIS — E7800 Pure hypercholesterolemia, unspecified: Secondary | ICD-10-CM | POA: Diagnosis not present

## 2021-10-23 DIAGNOSIS — E119 Type 2 diabetes mellitus without complications: Secondary | ICD-10-CM | POA: Diagnosis not present

## 2021-10-23 DIAGNOSIS — M21372 Foot drop, left foot: Secondary | ICD-10-CM | POA: Diagnosis not present

## 2021-10-29 DIAGNOSIS — M6281 Muscle weakness (generalized): Secondary | ICD-10-CM | POA: Diagnosis not present

## 2021-10-30 DIAGNOSIS — I69354 Hemiplegia and hemiparesis following cerebral infarction affecting left non-dominant side: Secondary | ICD-10-CM | POA: Diagnosis not present

## 2021-10-30 DIAGNOSIS — E7800 Pure hypercholesterolemia, unspecified: Secondary | ICD-10-CM | POA: Diagnosis not present

## 2021-10-30 DIAGNOSIS — K219 Gastro-esophageal reflux disease without esophagitis: Secondary | ICD-10-CM | POA: Diagnosis not present

## 2021-10-30 DIAGNOSIS — Z87891 Personal history of nicotine dependence: Secondary | ICD-10-CM | POA: Diagnosis not present

## 2021-10-30 DIAGNOSIS — I10 Essential (primary) hypertension: Secondary | ICD-10-CM | POA: Diagnosis not present

## 2021-10-30 DIAGNOSIS — H53462 Homonymous bilateral field defects, left side: Secondary | ICD-10-CM | POA: Diagnosis not present

## 2021-10-30 DIAGNOSIS — E039 Hypothyroidism, unspecified: Secondary | ICD-10-CM | POA: Diagnosis not present

## 2021-10-30 DIAGNOSIS — Z7982 Long term (current) use of aspirin: Secondary | ICD-10-CM | POA: Diagnosis not present

## 2021-10-30 DIAGNOSIS — I69398 Other sequelae of cerebral infarction: Secondary | ICD-10-CM | POA: Diagnosis not present

## 2021-10-30 DIAGNOSIS — Z794 Long term (current) use of insulin: Secondary | ICD-10-CM | POA: Diagnosis not present

## 2021-10-30 DIAGNOSIS — Z7902 Long term (current) use of antithrombotics/antiplatelets: Secondary | ICD-10-CM | POA: Diagnosis not present

## 2021-10-30 DIAGNOSIS — R350 Frequency of micturition: Secondary | ICD-10-CM | POA: Diagnosis not present

## 2021-10-30 DIAGNOSIS — K5090 Crohn's disease, unspecified, without complications: Secondary | ICD-10-CM | POA: Diagnosis not present

## 2021-10-30 DIAGNOSIS — Z7985 Long-term (current) use of injectable non-insulin antidiabetic drugs: Secondary | ICD-10-CM | POA: Diagnosis not present

## 2021-10-30 DIAGNOSIS — M21372 Foot drop, left foot: Secondary | ICD-10-CM | POA: Diagnosis not present

## 2021-10-30 DIAGNOSIS — E119 Type 2 diabetes mellitus without complications: Secondary | ICD-10-CM | POA: Diagnosis not present

## 2021-10-30 DIAGNOSIS — Z9181 History of falling: Secondary | ICD-10-CM | POA: Diagnosis not present

## 2021-11-05 ENCOUNTER — Ambulatory Visit: Payer: HMO | Admitting: Podiatry

## 2021-11-05 DIAGNOSIS — M79674 Pain in right toe(s): Secondary | ICD-10-CM | POA: Diagnosis not present

## 2021-11-05 DIAGNOSIS — M79675 Pain in left toe(s): Secondary | ICD-10-CM | POA: Diagnosis not present

## 2021-11-05 DIAGNOSIS — B351 Tinea unguium: Secondary | ICD-10-CM

## 2021-11-05 NOTE — Progress Notes (Signed)
Chief Complaint  Patient presents with   foot care    Routine foot care.    SUBJECTIVE Patient with a history of diabetes mellitus minimally ambulatory presenting today in a wheelchair presents to office today complaining of elongated, thickened nails that cause pain while ambulating in shoes.  Patient is unable to trim their own nails. Patient is here for further evaluation and treatment.   Past Medical History:  Diagnosis Date   Abdominal pain 12/06/2013   Abnormality of gait 01/26/2013   Altered bowel habits 12/11/2017   Bruises easily    Constipation    Crohn's disease ([place])    Depression    Dizziness and giddiness 01/26/2013   Fecal incontinence alternating with constipation 12/11/2017   GERD (gastroesophageal reflux disease)    Hemianopia    left   Hemiparesis and alteration of sensations as late effects of stroke ([place]) 01/26/2013   Hyperlipidemia    Hypertension    Hypothyroidism    Lump in female breast    Panic disorder    PONV (postoperative nausea and vomiting)    Stroke ([place]) 2009   Tremor, essential 04/24/2020   Type II diabetes mellitus ([place])    Past Surgical History:  Procedure Laterality Date   ANAL FISSURE REPAIR  2008   APPENDECTOMY  1986   BREAST BIOPSY Left 2006   neg   BREAST LUMPECTOMY Right 1990's   CESAREAN SECTION  1981; [place]  05/1991   TUBAL LIGATION  1983   Allergies  Allergen Reactions   Codeine Other (See Comments)    Syncope.   Morphine And Related Nausea And Vomiting and Other (See Comments)    Hallucinations.   Baclofen Nausea Only and Other (See Comments)    dizziness   Metformin Hcl Other (See Comments)   Penicillins Other (See Comments)    Family history   Sulfamethoxazole Itching    Hands itching    Elemental Sulfur Itching    Per patient happened years ago.     OBJECTIVE General Patient is awake, alert, and oriented x 3 and in no acute distress. Derm Skin is dry and  supple bilateral. Negative open lesions or macerations. Remaining integument unremarkable. Nails are tender, long, thickened and dystrophic with subungual debris, consistent with onychomycosis, 1-5 bilateral. No signs of infection noted. Vasc  DP and PT pedal pulses palpable bilaterally. Temperature gradient within normal limits.  Neuro Epicritic and protective threshold sensation diminished bilaterally.  Musculoskeletal Exam No symptomatic pedal deformities noted bilateral. Muscular strength within normal limits.  ASSESSMENT 1. Diabetes Mellitus w/ peripheral neuropathy 2.  Pain due to onychomycosis of toenails bilateral  PLAN OF CARE 1. Patient evaluated today. 2. Instructed to maintain good pedal hygiene and foot care. Stressed importance of controlling blood sugar.  3. Mechanical debridement of nails 1-5 bilaterally performed using a nail nipper. Filed with dremel without incident.  4. Return to clinic in 3 mos.     Edrick Kins, DPM [facility]  Dr. Edrick Kins, DPM    [address], [place] [address]                Office [phone]  Fax [phone]

## 2021-11-13 ENCOUNTER — Ambulatory Visit: Payer: HMO | Admitting: Dermatology

## 2021-11-13 DIAGNOSIS — L821 Other seborrheic keratosis: Secondary | ICD-10-CM

## 2021-11-13 DIAGNOSIS — L578 Other skin changes due to chronic exposure to nonionizing radiation: Secondary | ICD-10-CM

## 2021-11-13 NOTE — Progress Notes (Signed)
   Follow-Up Visit   Subjective  [name] is a 71 y.o. female who presents for the following: Growth (Right lower leg x years, has grown a little, has changed color, and has irregular edges now. Itchy at times and uses [employer]. ).   The following portions of the chart were reviewed this encounter and updated as appropriate:       Review of Systems:  No other skin or systemic complaints except as noted in HPI or Assessment and Plan.  Objective  Well appearing patient in no apparent distress; mood and affect are within normal limits.  A focused examination was performed including right lower leg. Relevant physical exam findings are noted in the Assessment and Plan.  Right Medial Lower Leg 1.8 cm waxy tan plaque with central clearing --Discussed benign etiology and prognosis.  Pt states was frozen in past       Assessment & Plan  Seborrheic keratosis Right Medial Lower Leg  Reassured benign age-related growth.  Recommend observation.  Discussed cryotherapy if spot(s) become irritated or inflamed.  No cryotherapy today since not bothersome.  Pt is diabetic.  Actinic Damage - chronic, secondary to cumulative UV radiation exposure/sun exposure over time - diffuse scaly erythematous macules with underlying dyspigmentation - Recommend daily broad spectrum sunscreen SPF 30+ to sun-exposed areas, reapply every 2 hours as needed.  - Recommend staying in the shade or wearing long sleeves, sun glasses (UVA+UVB protection) and wide brim hats (4-inch brim around the entire circumference of the hat). - Call for new or changing lesions.   Return as scheduled, for TBSE.  IJamesetta Orleans, CMA, am acting as scribe for Brendolyn Patty, MD .  Documentation: I have reviewed the above documentation for accuracy and completeness, and I agree with the above.  Brendolyn Patty MD

## 2021-11-13 NOTE — Patient Instructions (Addendum)
Seborrheic Keratosis  What causes seborrheic keratoses? Seborrheic keratoses are harmless, common skin growths that first appear during adult life.  As time goes by, more growths appear.  Some people may develop a large number of them.  Seborrheic keratoses appear on both covered and uncovered body parts.  They are not caused by sunlight.  The tendency to develop seborrheic keratoses can be inherited.  They vary in color from skin-colored to gray, brown, or even black.  They can be either smooth or have a rough, warty surface.   Seborrheic keratoses are superficial and look as if they were stuck on the skin.  Under the microscope this type of keratosis looks like layers upon layers of skin.  That is why at times the top layer may seem to fall off, but the rest of the growth remains and re-grows.    Treatment Seborrheic keratoses do not need to be treated, but can easily be removed in the office.  Seborrheic keratoses often cause symptoms when they rub on clothing or jewelry.  Lesions can be in the way of shaving.  If they become inflamed, they can cause itching, soreness, or burning.  Removal of a seborrheic keratosis can be accomplished by freezing, burning, or surgery. If any spot bleeds, scabs, or grows rapidly, please return to have it checked, as these can be an indication of a skin cancer.   Due to recent changes in healthcare laws, you may see results of your pathology and/or laboratory studies on MyChart before the doctors have had a chance to review them. We understand that in some cases there may be results that are confusing or concerning to you. Please understand that not all results are received at the same time and often the doctors may need to interpret multiple results in order to provide you with the best plan of care or course of treatment. Therefore, we ask that you please give [place] 2 business days to thoroughly review all your results before contacting the office for clarification.  Should we see a critical lab result, you will be contacted sooner.   If You Need Anything After Your Visit  If you have any questions or concerns for your doctor, please call our main line at [phone] and press option 4 to reach your doctor's medical assistant. If no one answers, please leave a voicemail as directed and we will return your call as soon as possible. Messages left after 4 pm will be answered the following business day.   You may also send [place] a message via [place]. We typically respond to MyChart messages within 1-2 business days.  For prescription refills, please ask your pharmacy to contact our office. Our fax number is [phone].  If you have an urgent issue when the clinic is closed that cannot wait until the next business day, you can page your doctor at the number below.    Please note that while we do our best to be available for urgent issues outside of office hours, we are not available 24/7.   If you have an urgent issue and are unable to reach [place], you may choose to seek medical care at your doctor's office, retail clinic, urgent care center, or emergency room.  If you have a medical emergency, please immediately call 911 or go to the emergency department.  Pager Numbers  - Dr. Nehemiah Massed: [phone]  - Dr. Laurence Ferrari: [phone]  - Dr. Nicole Kindred: [phone]  In the event of inclement weather, please call our main line  at [phone] for an update on the status of any delays or closures.  Dermatology Medication Tips: Please keep the boxes that topical medications come in in order to help keep track of the instructions about where and how to use these. Pharmacies typically print the medication instructions only on the boxes and not directly on the medication tubes.   If your medication is too expensive, please contact our office at [phone] option 4 or send [place] a message through [place].   We are unable to tell what your co-pay for medications will be  in advance as this is different depending on your insurance coverage. However, we may be able to find a substitute medication at lower cost or fill out paperwork to get insurance to cover a needed medication.   If a prior authorization is required to get your medication covered by your insurance company, please allow [place] 1-2 business days to complete this process.  Drug prices often vary depending on where the prescription is filled and some pharmacies may offer cheaper prices.  The website www.goodrx.com contains coupons for medications through different pharmacies. The prices here do not account for what the cost may be with help from insurance (it may be cheaper with your insurance), but the website can give you the price if you did not use any insurance.  - You can print the associated coupon and take it with your prescription to the pharmacy.  - You may also stop by our office during regular business hours and pick up a GoodRx coupon card.  - If you need your prescription sent electronically to a different pharmacy, notify our office through [facility] or by phone at [phone] option 4.     Si Usted Necesita Algo Despus de Su Visita  Tambin puede enviarnos un mensaje a travs de Pharmacist, community. Por lo general respondemos a los mensajes de MyChart en el transcurso de 1 a 2 das hbiles.  Para renovar recetas, por favor pida a su farmacia que se ponga en contacto con nuestra oficina. Harland Dingwall de fax es [place] [phone].  Si tiene un asunto urgente cuando la clnica est cerrada y que no puede esperar hasta el siguiente da hbil, puede llamar/localizar a su doctor(a) al nmero que aparece a continuacin.   Por favor, tenga en cuenta que aunque hacemos todo lo posible para estar disponibles para asuntos urgentes fuera del horario de [place], no estamos disponibles las 24 horas del da, los 7 das de la [place].   Si tiene un problema urgente y no puede comunicarse con nosotros,  puede optar por buscar atencin mdica  en el consultorio de su doctor(a), en una clnica privada, en un centro de atencin urgente o en una sala de emergencias.  Si tiene Engineering geologist, por favor llame inmediatamente al 911 o vaya a la sala de emergencias.  Nmeros de bper  - Dr. Nehemiah Massed: [phone]  - Dra. Moye: [phone]  - Dra. Nicole Kindred: [phone]  En caso de inclemencias del [place], por favor llame a Johnsie Kindred principal al [phone] para una actualizacin sobre el [place] de cualquier retraso o cierre.  Consejos para la medicacin en dermatologa: Por favor, guarde las cajas en las que vienen los medicamentos de uso tpico para ayudarle a seguir las instrucciones sobre dnde y cmo usarlos. Las farmacias generalmente imprimen las instrucciones del medicamento slo en las cajas y no directamente en los tubos del [place].   Si su medicamento es [employer], por favor, pngase en contacto  con Zigmund Daniel llamando al [phone] y presione la opcin 4 o envenos un mensaje a travs de Pharmacist, community.   No podemos decirle cul ser su copago por los medicamentos por adelantado ya que esto es diferente dependiendo de la cobertura de su seguro. Sin embargo, es posible que podamos encontrar un medicamento sustituto a Electrical engineer un formulario para que el seguro cubra el medicamento que se considera necesario.   Si se requiere una autorizacin previa para que su compaa de seguros [place] su medicamento, por favor permtanos de 1 a 2 das hbiles para completar este proceso.  Los precios de los medicamentos varan con frecuencia dependiendo del Environmental consultant de dnde se surte la receta y alguna farmacias pueden ofrecer precios ms baratos.  El sitio web www.goodrx.com tiene cupones para medicamentos de Airline pilot. Los precios aqu no tienen en cuenta lo que podra costar con la ayuda del seguro (puede ser ms barato con su seguro), pero el sitio web puede darle el  precio si no utiliz Research scientist (physical sciences).  - Puede imprimir el cupn correspondiente y llevarlo con su receta a la farmacia.  - Tambin puede pasar por nuestra oficina durante el horario de atencin regular y Charity fundraiser una tarjeta de cupones de GoodRx.  - Si necesita que su receta se enve electrnicamente a una farmacia diferente, informe a nuestra oficina a travs de MyChart de [place] o por telfono llamando al [phone] y presione la opcin 4.

## 2021-11-20 DIAGNOSIS — Z794 Long term (current) use of insulin: Secondary | ICD-10-CM | POA: Diagnosis not present

## 2021-11-20 DIAGNOSIS — M21372 Foot drop, left foot: Secondary | ICD-10-CM | POA: Diagnosis not present

## 2021-11-20 DIAGNOSIS — H53462 Homonymous bilateral field defects, left side: Secondary | ICD-10-CM | POA: Diagnosis not present

## 2021-11-20 DIAGNOSIS — Z87891 Personal history of nicotine dependence: Secondary | ICD-10-CM | POA: Diagnosis not present

## 2021-11-20 DIAGNOSIS — E119 Type 2 diabetes mellitus without complications: Secondary | ICD-10-CM | POA: Diagnosis not present

## 2021-11-20 DIAGNOSIS — E7800 Pure hypercholesterolemia, unspecified: Secondary | ICD-10-CM | POA: Diagnosis not present

## 2021-11-20 DIAGNOSIS — I69354 Hemiplegia and hemiparesis following cerebral infarction affecting left non-dominant side: Secondary | ICD-10-CM | POA: Diagnosis not present

## 2021-11-20 DIAGNOSIS — K5090 Crohn's disease, unspecified, without complications: Secondary | ICD-10-CM | POA: Diagnosis not present

## 2021-11-20 DIAGNOSIS — Z7902 Long term (current) use of antithrombotics/antiplatelets: Secondary | ICD-10-CM | POA: Diagnosis not present

## 2021-11-20 DIAGNOSIS — Z9181 History of falling: Secondary | ICD-10-CM | POA: Diagnosis not present

## 2021-11-20 DIAGNOSIS — K219 Gastro-esophageal reflux disease without esophagitis: Secondary | ICD-10-CM | POA: Diagnosis not present

## 2021-11-20 DIAGNOSIS — I69398 Other sequelae of cerebral infarction: Secondary | ICD-10-CM | POA: Diagnosis not present

## 2021-11-20 DIAGNOSIS — R350 Frequency of micturition: Secondary | ICD-10-CM | POA: Diagnosis not present

## 2021-11-20 DIAGNOSIS — E039 Hypothyroidism, unspecified: Secondary | ICD-10-CM | POA: Diagnosis not present

## 2021-11-20 DIAGNOSIS — Z7982 Long term (current) use of aspirin: Secondary | ICD-10-CM | POA: Diagnosis not present

## 2021-11-20 DIAGNOSIS — Z7985 Long-term (current) use of injectable non-insulin antidiabetic drugs: Secondary | ICD-10-CM | POA: Diagnosis not present

## 2021-11-20 DIAGNOSIS — I10 Essential (primary) hypertension: Secondary | ICD-10-CM | POA: Diagnosis not present

## 2021-11-28 DIAGNOSIS — M6281 Muscle weakness (generalized): Secondary | ICD-10-CM | POA: Diagnosis not present

## 2021-12-31 ENCOUNTER — Ambulatory Visit: Payer: HMO | Admitting: Dermatology

## 2021-12-31 DIAGNOSIS — L719 Rosacea, unspecified: Secondary | ICD-10-CM | POA: Diagnosis not present

## 2021-12-31 DIAGNOSIS — L578 Other skin changes due to chronic exposure to nonionizing radiation: Secondary | ICD-10-CM

## 2021-12-31 DIAGNOSIS — L304 Erythema intertrigo: Secondary | ICD-10-CM | POA: Diagnosis not present

## 2021-12-31 DIAGNOSIS — D229 Melanocytic nevi, unspecified: Secondary | ICD-10-CM

## 2021-12-31 DIAGNOSIS — Z1283 Encounter for screening for malignant neoplasm of skin: Secondary | ICD-10-CM | POA: Diagnosis not present

## 2021-12-31 DIAGNOSIS — L820 Inflamed seborrheic keratosis: Secondary | ICD-10-CM | POA: Diagnosis not present

## 2021-12-31 DIAGNOSIS — L853 Xerosis cutis: Secondary | ICD-10-CM

## 2021-12-31 DIAGNOSIS — L814 Other melanin hyperpigmentation: Secondary | ICD-10-CM

## 2021-12-31 DIAGNOSIS — L821 Other seborrheic keratosis: Secondary | ICD-10-CM

## 2021-12-31 MED ORDER — KETOCONAZOLE 2 % EX CREA: TOPICAL_CREAM | CUTANEOUS | 11 refills | Status: DC

## 2021-12-31 MED ORDER — METRONIDAZOLE 0.75 % EX CREA: TOPICAL_CREAM | CUTANEOUS | 11 refills | Status: AC

## 2021-12-31 NOTE — Progress Notes (Signed)
Follow-Up Visit   Subjective  [name] is a 71 y.o. female who presents for the following: Annual Exam (Tbse, hx of isks, hx of intertrigo has used ketoconazole cream at affected areas would like refill. Patient reports some spots she would like checked at scalp, left hand , left forearm, and right ankle. ). She never got topical Rx for rosacea.  The patient presents for Total-Body Skin Exam (TBSE) for skin cancer screening and mole check.  The patient has spots, moles and lesions to be evaluated, some may be new or changing and the patient has concerns that these could be cancer.  The following portions of the chart were reviewed this encounter and updated as appropriate:      Review of Systems: No other skin or systemic complaints except as noted in HPI or Assessment and Plan.   Objective  Well appearing patient in no apparent distress; mood and affect are within normal limits.  A full examination was performed including scalp, head, eyes, ears, nose, lips, neck, chest, axillae, abdomen, back, buttocks, bilateral upper extremities, bilateral lower extremities, hands, feet, fingers, toes, fingernails, and toenails. All findings within normal limits unless otherwise noted below.  b/l inguinal creases, groin, axilla Clear at exam  face Telangectasia with erythema at malar cheeks and nose  left hand dorsum , right mid forearm Erythematous stuck-on, waxy papules  right medial lower leg 1.8 cm waxy tan plaque with central clearing --Discussed benign etiology and prognosis.  Pt states was frozen in past, not bothersome  right medial cheek 2.5 mm medium dark brown papule slightly waxy    Assessment & Plan  Erythema intertrigo b/l inguinal creases, groin, axilla  Chronic condition with duration or expected duration over one year. Currently well-controlled.   Intertrigo is a chronic recurrent rash that occurs in skin fold areas that may be associated with friction; heat;  moisture; yeast; fungus; and bacteria.  It is exacerbated by increased movement / activity; sweating; and higher atmospheric temperature.  Continue when flared Ketoconazole 2% cream apply to aa's QD-BID.    ketoconazole (NIZORAL) 2 % cream - b/l inguinal creases, groin, axilla Apply topically to aa's of groin and under arms qd/bid prn for rash  Rosacea face  Chronic and persistent condition with duration or expected duration over one year. Condition is symptomatic/ bothersome to patient. Not currently at goal.   Rosacea is a chronic progressive skin condition usually affecting the face of adults, causing redness and/or acne bumps. It is treatable but not curable. It sometimes affects the eyes (ocular rosacea) as well. It may respond to topical and/or systemic medication and can flare with stress, sun exposure, alcohol, exercise, topical steroids (including hydrocortisone/cortisone 10) and some foods.  Daily application of broad spectrum spf 30+ sunscreen to face is recommended to reduce flares.  Start metronidazole 0.75 % apply cream qd/bid     metroNIDAZOLE (METROCREAM) 0.75 % cream - face Apply qd/bid to face for rosacea  Inflamed seborrheic keratosis left hand dorsum , right mid forearm  Patient deferred treatment today   Recommend starting moisturizer with exfoliant (Urea, Salicylic acid, or Lactic acid) one to two times daily to help smooth rough and bumpy skin.  OTC options include Cetaphil Rough and Bumpy lotion (Urea), Eucerin Roughness Relief lotion or spot treatment cream (Urea), CeraVe SA lotion/cream for Rough and Bumpy skin (Sal Acid), Gold Bond Rough and Bumpy cream (Sal Acid), and AmLactin 12% lotion/cream (Lactic Acid).  If applying in morning, also apply sunscreen to  sun-exposed areas, since these exfoliating moisturizers can increase sensitivity to sun.   Seborrheic keratosis (2) right medial lower leg; right medial cheek  Reassured benign age-related growth.   Recommend observation.  Discussed cryotherapy if spot(s) become irritated or inflamed.  vs nevus at right medial cheek  Lentigines - Scattered tan macules - Due to sun exposure - Benign-appearing, observe - Recommend daily broad spectrum sunscreen SPF 30+ to sun-exposed areas, reapply every 2 hours as needed. - Call for any changes  Xerosis - diffuse xerotic patches - recommend gentle, hydrating skin care - gentle skin care handout given   Seborrheic Keratoses At back, at face, at scalp - Stuck-on, waxy, tan-brown papules and/or plaques  - Benign-appearing - Discussed benign etiology and prognosis. - Observe - Call for any changes  Melanocytic Nevi - Tan-brown and/or pink-flesh-colored symmetric macules and papules - Benign appearing on exam today - Observation - Call clinic for new or changing moles - Recommend daily use of broad spectrum spf 30+ sunscreen to sun-exposed areas.   Hemangiomas - Red papules - Discussed benign nature - Observe - Call for any changes  Actinic Damage - Chronic condition, secondary to cumulative UV/sun exposure - diffuse scaly erythematous macules with underlying dyspigmentation - Recommend daily broad spectrum sunscreen SPF 30+ to sun-exposed areas, reapply every 2 hours as needed.  - Staying in the shade or wearing long sleeves, sun glasses (UVA+UVB protection) and wide brim hats (4-inch brim around the entire circumference of the hat) are also recommended for sun protection.  - Call for new or changing lesions.  Skin cancer screening performed today. Return in about 1 year (around 01/01/2023) for TBSE. I, Ruthell Rummage, CMA, am acting as scribe for Brendolyn Patty, MD.  Documentation: I have reviewed the above documentation for accuracy and completeness, and I agree with the above.  Brendolyn Patty MD

## 2021-12-31 NOTE — Patient Instructions (Addendum)
Recommend starting moisturizer with exfoliant (Urea, Salicylic acid, or Lactic acid) one to two times daily to help smooth rough and bumpy skin.  OTC options include Cetaphil Rough and Bumpy lotion (Urea), Eucerin Roughness Relief lotion or spot treatment cream (Urea), CeraVe SA lotion/cream for Rough and Bumpy skin (Sal Acid), Gold Bond Rough and Bumpy cream (Sal Acid), and AmLactin 12% lotion/cream (Lactic Acid).  If applying in morning, also apply sunscreen to sun-exposed areas, since these exfoliating moisturizers can increase sensitivity to sun.   Recommend OTC Gold Bond Rapid Relief Anti-Itch cream (pramoxine + menthol), CeraVe Anti-itch cream or lotion (pramoxine), Sarna lotion (Original- menthol + camphor or Sensitive- pramoxine) or Eucerin 12 hour Itch Relief lotion (menthol) up to 3 times per day to areas on body that are itchy.   Gentle Skin Care Guide  1. Bathe no more than once a day.  2. Avoid bathing in hot water  3. Use a mild soap like Dove, Vanicream, Cetaphil, CeraVe. Can use Lever 2000 or Cetaphil antibacterial soap  4. Use soap only where you need it. On most days, use it under your arms, between your legs, and on your feet. Let the water rinse other areas unless visibly dirty.  5. When you get out of the bath/shower, use a towel to gently blot your skin dry, don't rub it.  6. While your skin is still a little damp, apply a moisturizing cream such as Vanicream, CeraVe, Cetaphil, Eucerin, Sarna lotion or plain Vaseline Jelly. For hands apply Neutrogena [place] Hand Cream or Excipial Hand Cream.  7. Reapply moisturizer any time you start to itch or feel dry.  8. Sometimes using free and clear laundry detergents can be helpful. Fabric softener sheets should be avoided. Downy Free & Gentle liquid, or any liquid fabric softener that is free of dyes and perfumes, it acceptable to use  9. If your doctor has given you prescription creams you may apply moisturizers over them      Seborrheic Keratosis  What causes seborrheic keratoses? Seborrheic keratoses are harmless, common skin growths that first appear during adult life.  As time goes by, more growths appear.  Some people may develop a large number of them.  Seborrheic keratoses appear on both covered and uncovered body parts.  They are not caused by sunlight.  The tendency to develop seborrheic keratoses can be inherited.  They vary in color from skin-colored to gray, brown, or even black.  They can be either smooth or have a rough, warty surface.   Seborrheic keratoses are superficial and look as if they were stuck on the skin.  Under the microscope this type of keratosis looks like layers upon layers of skin.  That is why at times the top layer may seem to fall off, but the rest of the growth remains and re-grows.    Treatment Seborrheic keratoses do not need to be treated, but can easily be removed in the office.  Seborrheic keratoses often cause symptoms when they rub on clothing or jewelry.  Lesions can be in the way of shaving.  If they become inflamed, they can cause itching, soreness, or burning.  Removal of a seborrheic keratosis can be accomplished by freezing, burning, or surgery. If any spot bleeds, scabs, or grows rapidly, please return to have it checked, as these can be an indication of a skin cancer.    Melanoma ABCDEs  Melanoma is the most dangerous type of skin cancer, and is the leading cause of death from  skin disease.  You are more likely to develop melanoma if you: Have light-colored skin, light-colored eyes, or red or blond hair Spend a lot of time in the sun Tan regularly, either outdoors or in a tanning bed Have had blistering sunburns, especially during childhood Have a close family member who has had a melanoma Have atypical moles or large birthmarks  Early detection of melanoma is key since treatment is typically straightforward and cure rates are extremely high if we catch it  early.   The first sign of melanoma is often a change in a mole or a new dark spot.  The ABCDE system is a way of remembering the signs of melanoma.  A for asymmetry:  The two halves do not match. B for border:  The edges of the growth are irregular. C for color:  A mixture of colors are present instead of an even brown color. D for diameter:  Melanomas are usually (but not always) greater than 60m - the size of a pencil eraser. E for evolution:  The spot keeps changing in size, shape, and color.  Please check your skin once per month between visits. You can use a small mirror in front and a large mirror behind you to keep an eye on the back side or your body.   If you see any new or changing lesions before your next follow-up, please call to schedule a visit.  Please continue daily skin protection including broad spectrum sunscreen SPF 30+ to sun-exposed areas, reapplying every 2 hours as needed when you're outdoors.   Staying in the shade or wearing long sleeves, sun glasses (UVA+UVB protection) and wide brim hats (4-inch brim around the entire circumference of the hat) are also recommended for sun protection.   Due to recent changes in healthcare laws, you may see results of your pathology and/or laboratory studies on MyChart before the doctors have had a chance to review them. We understand that in some cases there may be results that are confusing or concerning to you. Please understand that not all results are received at the same time and often the doctors may need to interpret multiple results in order to provide you with the best plan of care or course of treatment. Therefore, we ask that you please give u[place]2 business days to thoroughly review all your results before contacting the office for clarification. Should we see a critical lab result, you will be contacted sooner.   If You Need Anything After Your Visit  If you have any questions or concerns for your doctor, please call our  main line at [phone] press option 4 to reach your doctor's medical assistant. If no one answers, please leave a voicemail as directed and we will return your call as soon as possible. Messages left after 4 pm will be answered the following business day.   You may also send [place] message via [place] We typically respond to MyChart messages within 1-2 business days.  For prescription refills, please ask your pharmacy to contact our office. Our fax number is [phone]  If you have an urgent issue when the clinic is closed that cannot wait until the next business day, you can page your doctor at the number below.    Please note that while we do our best to be available for urgent issues outside of office hours, we are not available 24/7.   If you have an urgent issue and are unable to reach u[place] you may  choose to seek medical care at your doctor's office, retail clinic, urgent care center, or emergency room.  If you have a medical emergency, please immediately call 911 or go to the emergency department.  Pager Numbers  - Dr. Nehemiah Massed: [phone]  - Dr. Laurence Ferrari: [phone]  - Dr. Nicole Kindred: [phone]  In the event of inclement weather, please call our main line at [phone] for an update on the status of any delays or closures.  Dermatology Medication Tips: Please keep the boxes that topical medications come in in order to help keep track of the instructions about where and how to use these. Pharmacies typically print the medication instructions only on the boxes and not directly on the medication tubes.   If your medication is too expensive, please contact our office at [phone] option 4 or send [place] a message through [place].   We are unable to tell what your co-pay for medications will be in advance as this is different depending on your insurance coverage. However, we may be able to find a substitute medication at lower cost or fill out paperwork to get insurance to  cover a needed medication.   If a prior authorization is required to get your medication covered by your insurance company, please allow [place] 1-2 business days to complete this process.  Drug prices often vary depending on where the prescription is filled and some pharmacies may offer cheaper prices.  The website www.goodrx.com contains coupons for medications through different pharmacies. The prices here do not account for what the cost may be with help from insurance (it may be cheaper with your insurance), but the website can give you the price if you did not use any insurance.  - You can print the associated coupon and take it with your prescription to the pharmacy.  - You may also stop by our office during regular business hours and pick up a GoodRx coupon card.  - If you need your prescription sent electronically to a different pharmacy, notify our office through [facility] or by phone at [phone] option 4.     Si Usted Necesita Algo Despus de Su Visita  Tambin puede enviarnos un mensaje a travs de Pharmacist, community. Por lo general respondemos a los mensajes de MyChart en el transcurso de 1 a 2 das hbiles.  Para renovar recetas, por favor pida a su farmacia que se ponga en contacto con nuestra oficina. Harland Dingwall de fax es [place] [phone].  Si tiene un asunto urgente cuando la clnica est cerrada y que no puede esperar hasta el siguiente da hbil, puede llamar/localizar a su doctor(a) al nmero que aparece a continuacin.   Por favor, tenga en cuenta que aunque hacemos todo lo posible para estar disponibles para asuntos urgentes fuera del horario de [place], no estamos disponibles las 24 horas del da, los 7 das de la [place].   Si tiene un problema urgente y no puede comunicarse con nosotros, puede optar por buscar atencin mdica  en el consultorio de su doctor(a), en una clnica privada, en un centro de atencin urgente o en una sala de emergencias.  Si tiene Conservator, museum/gallery, por favor llame inmediatamente al 911 o vaya a la sala de emergencias.  Nmeros de bper  - Dr. Nehemiah Massed: [phone]  - Dra. Moye: [phone]  - Dra. Nicole Kindred: [phone]  En caso de inclemencias del [place], por favor llame a Johnsie Kindred principal al [phone] para una actualizacin sobre el [place] de cualquier retraso o cierre.  Consejos para la medicacin en dermatologa: Por favor, guarde las cajas en las que vienen los medicamentos de uso tpico para ayudarle a seguir las instrucciones sobre dnde y cmo usarlos. Las farmacias generalmente imprimen las instrucciones del medicamento slo en las cajas y no directamente en los tubos del [place].   Si su medicamento es muy caro, por favor, pngase en contacto con Zigmund Daniel llamando al [phone] y presione la opcin 4 o envenos un mensaje a travs de Pharmacist, community.   No podemos decirle cul ser su copago por los medicamentos por adelantado ya que esto es diferente dependiendo de la cobertura de su seguro. Sin embargo, es posible que podamos encontrar un medicamento sustituto a Electrical engineer un formulario para que el seguro cubra el medicamento que se considera necesario.   Si se requiere una autorizacin previa para que su compaa de seguros [place] su medicamento, por favor permtanos de 1 a 2 das hbiles para completar este proceso.  Los precios de los medicamentos varan con frecuencia dependiendo del Environmental consultant de dnde se surte la receta y alguna farmacias pueden ofrecer precios ms baratos.  El sitio web www.goodrx.com tiene cupones para medicamentos de Airline pilot. Los precios aqu no tienen en cuenta lo que podra costar con la ayuda del seguro (puede ser ms barato con su seguro), pero el sitio web puede darle el precio si no utiliz Research scientist (physical sciences).  - Puede imprimir el cupn correspondiente y llevarlo con su receta a la farmacia.  - Tambin puede pasar por nuestra oficina durante el  horario de atencin regular y Charity fundraiser una tarjeta de cupones de GoodRx.  - Si necesita que su receta se enve electrnicamente a una farmacia diferente, informe a nuestra oficina a travs de MyChart de [place] o por telfono llamando al [phone] y presione la opcin 4.

## 2022-01-01 ENCOUNTER — Encounter: Payer: Self-pay | Admitting: Gastroenterology

## 2022-01-03 ENCOUNTER — Encounter: Payer: Self-pay | Admitting: Podiatry

## 2022-01-03 ENCOUNTER — Ambulatory Visit (INDEPENDENT_AMBULATORY_CARE_PROVIDER_SITE_OTHER): Payer: HMO | Admitting: Podiatry

## 2022-01-03 DIAGNOSIS — B351 Tinea unguium: Secondary | ICD-10-CM

## 2022-01-03 DIAGNOSIS — M79674 Pain in right toe(s): Secondary | ICD-10-CM | POA: Diagnosis not present

## 2022-01-03 DIAGNOSIS — M79675 Pain in left toe(s): Secondary | ICD-10-CM

## 2022-01-03 NOTE — Progress Notes (Signed)
Chief Complaint  Patient presents with   Nail Problem    "Do my nails and Diabetic foot exam."    SUBJECTIVE Patient with a history of diabetes mellitus minimally ambulatory presenting today in a wheelchair presents to office today complaining of elongated, thickened nails that cause pain while ambulating in shoes.  Patient is unable to trim their own nails. Patient is here for further evaluation and treatment.   Past Medical History:  Diagnosis Date   Abdominal pain 12/06/2013   Abnormality of gait 01/26/2013   Altered bowel habits 12/11/2017   Bruises easily    Constipation    Crohn's disease ([place])    Depression    Dizziness and giddiness 01/26/2013   Fecal incontinence alternating with constipation 12/11/2017   GERD (gastroesophageal reflux disease)    Hemianopia    left   Hemiparesis and alteration of sensations as late effects of stroke ([place]) 01/26/2013   Hyperlipidemia    Hypertension    Hypothyroidism    Lump in female breast    Panic disorder    PONV (postoperative nausea and vomiting)    Stroke ([place]) 2009   Tremor, essential 04/24/2020   Type II diabetes mellitus ([place])    Past Surgical History:  Procedure Laterality Date   ANAL FISSURE REPAIR  2008   APPENDECTOMY  1986   BREAST BIOPSY Left 2006   neg   BREAST LUMPECTOMY Right 1990's   CESAREAN SECTION  1981; [place]  05/1991   TUBAL LIGATION  1983   Allergies  Allergen Reactions   Codeine Other (See Comments)    Syncope.   Morphine And Related Nausea And Vomiting and Other (See Comments)    Hallucinations.   Baclofen Nausea Only and Other (See Comments)    dizziness   Metformin Hcl Other (See Comments)   Penicillins Other (See Comments)    Family history   Sulfamethoxazole Itching    Hands itching    Elemental Sulfur Itching    Per patient happened years ago.     OBJECTIVE General Patient is awake, alert, and oriented x 3 and in no acute  distress. Derm Skin is dry and supple bilateral. Negative open lesions or macerations. Remaining integument unremarkable. Nails are tender, long, thickened and dystrophic with subungual debris, consistent with onychomycosis, 1-5 bilateral. No signs of infection noted. Vasc  DP and PT pedal pulses palpable bilaterally. Temperature gradient within normal limits.  Neuro Epicritic and protective threshold sensation diminished bilaterally.  Musculoskeletal Exam No symptomatic pedal deformities noted bilateral. Muscular strength within normal limits.  ASSESSMENT 1. Diabetes Mellitus w/ peripheral neuropathy 2.  Pain due to onychomycosis of toenails bilateral  PLAN OF CARE 1. Patient evaluated today. 2. Instructed to maintain good pedal hygiene and foot care. Stressed importance of controlling blood sugar.  3. Mechanical debridement of nails 1-5 bilaterally performed using a nail nipper. Filed with dremel without incident.  4. Return to clinic in 3 mos.     Edrick Kins, DPM [facility]  Dr. Edrick Kins, DPM    [address] [place] [address]                Office [phone]  Fax (  [phone]    

## 2022-02-12 ENCOUNTER — Other Ambulatory Visit: Payer: Self-pay | Admitting: Internal Medicine

## 2022-02-12 DIAGNOSIS — K449 Diaphragmatic hernia without obstruction or gangrene: Secondary | ICD-10-CM

## 2022-02-25 ENCOUNTER — Ambulatory Visit: Payer: HMO

## 2022-02-25 ENCOUNTER — Ambulatory Visit
Admission: RE | Admit: 2022-02-25 | Discharge: 2022-02-25 | Disposition: A | Discharge status: Home / Self Care | Payer: HMO | Source: Ambulatory Visit | Attending: Internal Medicine | Admitting: Internal Medicine

## 2022-02-25 DIAGNOSIS — K449 Diaphragmatic hernia without obstruction or gangrene: Secondary | ICD-10-CM | POA: Insufficient documentation

## 2022-02-25 DIAGNOSIS — R1013 Epigastric pain: Secondary | ICD-10-CM | POA: Diagnosis not present

## 2022-02-28 ENCOUNTER — Ambulatory Visit: Payer: PPO | Admitting: Podiatry

## 2022-02-28 DIAGNOSIS — M79674 Pain in right toe(s): Secondary | ICD-10-CM | POA: Diagnosis not present

## 2022-02-28 DIAGNOSIS — M79675 Pain in left toe(s): Secondary | ICD-10-CM | POA: Diagnosis not present

## 2022-02-28 DIAGNOSIS — B351 Tinea unguium: Secondary | ICD-10-CM | POA: Diagnosis not present

## 2022-02-28 DIAGNOSIS — M6281 Muscle weakness (generalized): Secondary | ICD-10-CM | POA: Diagnosis not present

## 2022-02-28 NOTE — Progress Notes (Signed)
Chief Complaint  Patient presents with   routine foot care     [facility]    SUBJECTIVE Patient with a history of diabetes mellitus minimally ambulatory presenting today in a wheelchair presents to office today complaining of elongated, thickened nails that cause pain while ambulating in shoes.  Patient is unable to trim their own nails. Patient is here for further evaluation and treatment.   Past Medical History:  Diagnosis Date   Abdominal pain 12/06/2013   Abnormality of gait 01/26/2013   Altered bowel habits 12/11/2017   Bruises easily    Constipation    Crohn's disease ([place])    Depression    Dizziness and giddiness 01/26/2013   Fecal incontinence alternating with constipation 12/11/2017   GERD (gastroesophageal reflux disease)    Hemianopia    left   Hemiparesis and alteration of sensations as late effects of stroke ([place]) 01/26/2013   Hyperlipidemia    Hypertension    Hypothyroidism    Lump in female breast    Panic disorder    PONV (postoperative nausea and vomiting)    Stroke ([place]) 2009   Tremor, essential 04/24/2020   Type II diabetes mellitus ([place])    Past Surgical History:  Procedure Laterality Date   ANAL FISSURE REPAIR  2008   APPENDECTOMY  1986   BREAST BIOPSY Left 2006   neg   BREAST LUMPECTOMY Right 1990's   CESAREAN SECTION  1981; [place]  05/1991   TUBAL LIGATION  1983   Allergies  Allergen Reactions   Codeine Other (See Comments)    Syncope.   Morphine And Related Nausea And Vomiting and Other (See Comments)    Hallucinations.   Baclofen Nausea Only and Other (See Comments)    dizziness   Metformin Hcl Other (See Comments)   Penicillins Other (See Comments)    Family history   Sulfamethoxazole Itching    Hands itching    Elemental Sulfur Itching    Per patient happened years ago.     OBJECTIVE General Patient is awake, alert, and oriented x 3 and in no acute distress. Derm Skin is dry and supple  bilateral. Negative open lesions or macerations. Remaining integument unremarkable. Nails are tender, long, thickened and dystrophic with subungual debris, consistent with onychomycosis, 1-5 bilateral. No signs of infection noted. Vasc  DP and PT pedal pulses palpable bilaterally. Temperature gradient within normal limits.  Neuro Epicritic and protective threshold sensation diminished bilaterally.  Musculoskeletal Exam No symptomatic pedal deformities noted bilateral. Muscular strength within normal limits.  ASSESSMENT 1. Diabetes Mellitus w/ peripheral neuropathy 2.  Pain due to onychomycosis of toenails bilateral  PLAN OF CARE 1. Patient evaluated today. 2. Instructed to maintain good pedal hygiene and foot care. Stressed importance of controlling blood sugar.  3. Mechanical debridement of nails 1-5 bilaterally performed using a nail nipper. Filed with dremel without incident.  4. Return to clinic in 3 mos.     Edrick Kins, DPM [facility]  Dr. Edrick Kins, DPM    [address], [place] [address]                Office [phone]  Fax [phone]

## 2022-03-14 DIAGNOSIS — H9313 Tinnitus, bilateral: Secondary | ICD-10-CM | POA: Diagnosis not present

## 2022-03-14 DIAGNOSIS — H903 Sensorineural hearing loss, bilateral: Secondary | ICD-10-CM | POA: Diagnosis not present

## 2022-03-14 DIAGNOSIS — J301 Allergic rhinitis due to pollen: Secondary | ICD-10-CM | POA: Diagnosis not present

## 2022-03-14 DIAGNOSIS — H6983 Other specified disorders of Eustachian tube, bilateral: Secondary | ICD-10-CM | POA: Diagnosis not present

## 2022-03-24 ENCOUNTER — Other Ambulatory Visit: Payer: Self-pay

## 2022-03-24 MED ORDER — TRULICITY 1.5 MG/0.5ML ~~LOC~~ SOAJ
1.5000 mg | SUBCUTANEOUS | 3 refills | Status: DC
  Filled 2022-03-24: qty 2, 28d supply, fill #0
  Filled 2022-04-17: qty 2, 28d supply, fill #1
  Filled 2022-05-15: qty 2, 28d supply, fill #2
  Filled 2022-06-13: qty 2, 28d supply, fill #3

## 2022-03-25 ENCOUNTER — Other Ambulatory Visit: Payer: Self-pay

## 2022-03-27 ENCOUNTER — Encounter: Payer: Self-pay | Admitting: Gastroenterology

## 2022-03-30 ENCOUNTER — Encounter: Payer: Self-pay | Admitting: Gastroenterology

## 2022-03-31 ENCOUNTER — Other Ambulatory Visit: Payer: Self-pay

## 2022-03-31 MED ORDER — LINACLOTIDE 72 MCG PO CAPS: 72.0000 ug | ORAL_CAPSULE | Freq: Every day | ORAL | 0 refills | Status: DC

## 2022-03-31 MED ORDER — LINACLOTIDE 72 MCG PO CAPS
72.0000 ug | ORAL_CAPSULE | Freq: Every day | ORAL | 0 refills | Status: DC
  Filled 2022-03-31 – 2022-04-29 (×2): qty 30, 30d supply, fill #0

## 2022-04-01 ENCOUNTER — Other Ambulatory Visit: Payer: Self-pay | Admitting: Internal Medicine

## 2022-04-01 DIAGNOSIS — Z1231 Encounter for screening mammogram for malignant neoplasm of breast: Secondary | ICD-10-CM

## 2022-04-03 IMAGING — MR MR MRA HEAD W/O CM
1 series · 19 of 48 positions shown · IV contrast (gadavist)
Comparison: None.

CLINICAL DATA: Stroke/TIA

EXAM:
MRA NECK WITHOUT AND WITH CONTRAST
MRA HEAD WITHOUT CONTRAST
TECHNIQUE: Multiplanar and multiecho pulse sequences of the neck were obtained
without and with intravenous contrast. Angiographic images of the
neck were obtained using MRA technique without and with intravenous
contrast; Angiographic images of the Circle of Willis were obtained
using MRA technique without intravenous contrast.
CONTRAST:  7mL GADAVIST GADOBUTROL 1 MMOL/ML IV SOLN

[Series 5: TOF · axial · 0.5mm · 0.41mm/px · z∈[-99,-2]mm · 19 of 205 slices shown]
[im 1/205]
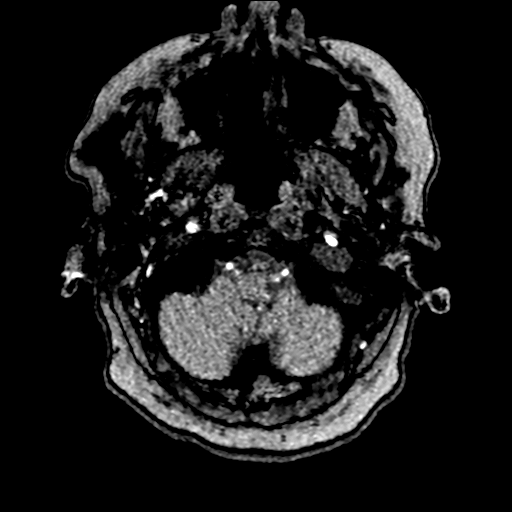
[im 5/205]
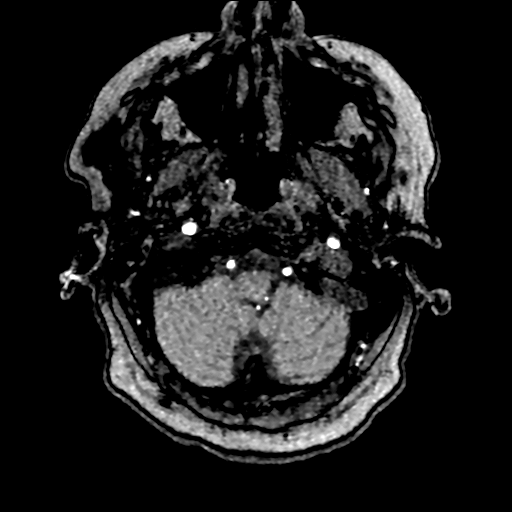
[im 9/205]
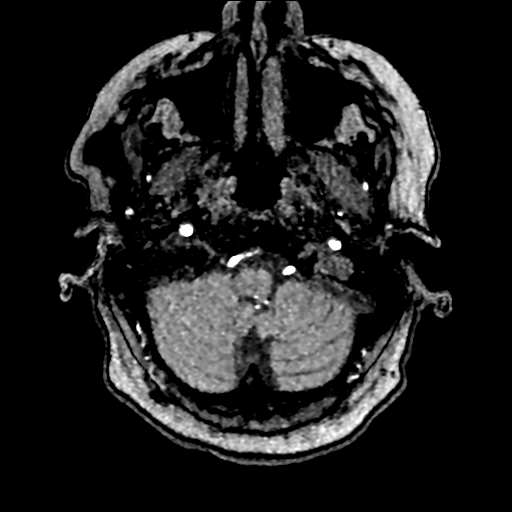
[im 14/205]
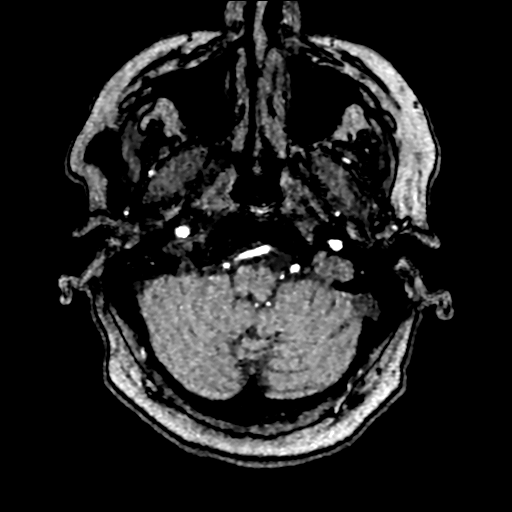
[im 18/205]
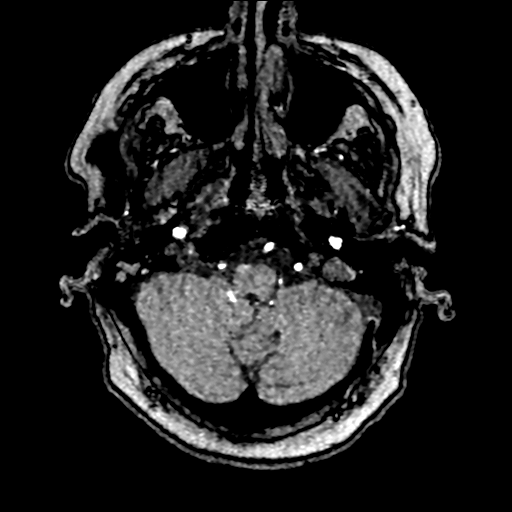
[im 22/205]
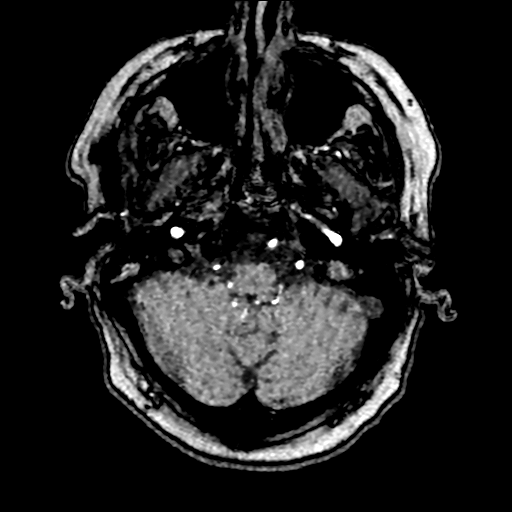
[im 27/205]
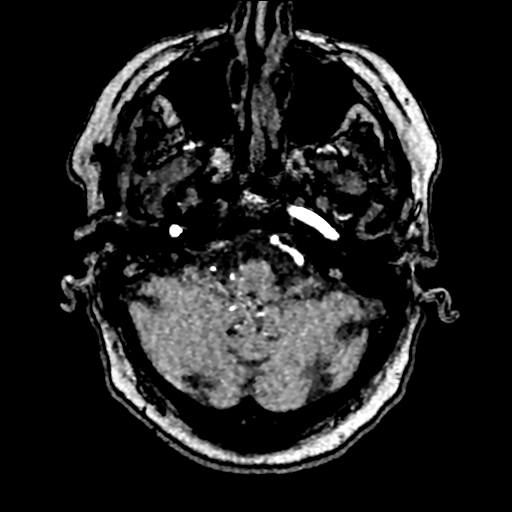
[im 31/205]
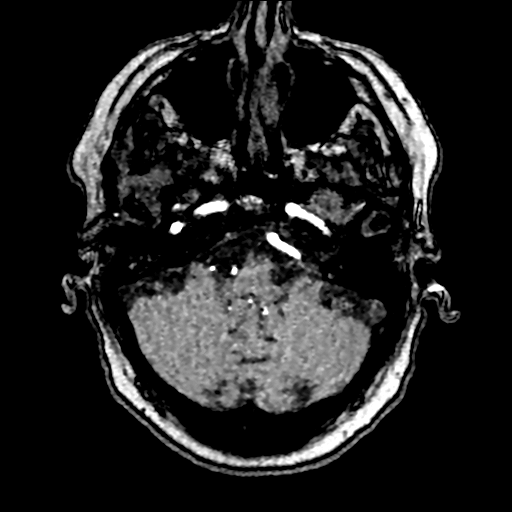
[im 35/205]
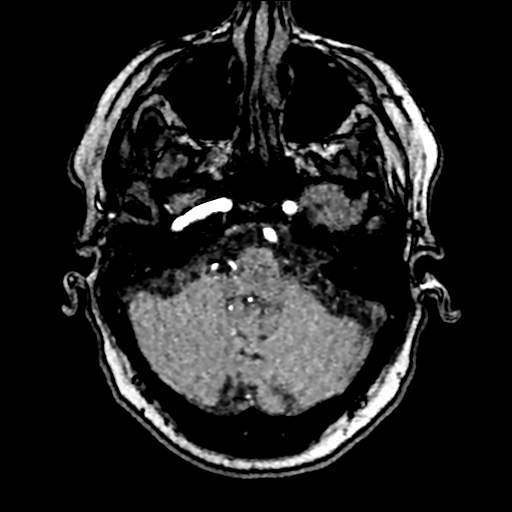
[im 40/205]
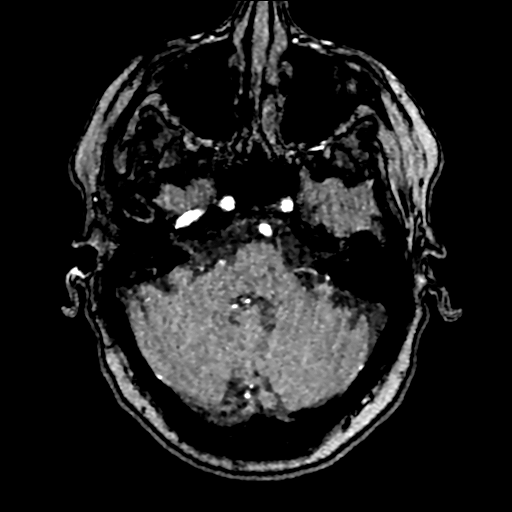
[im 44/205]
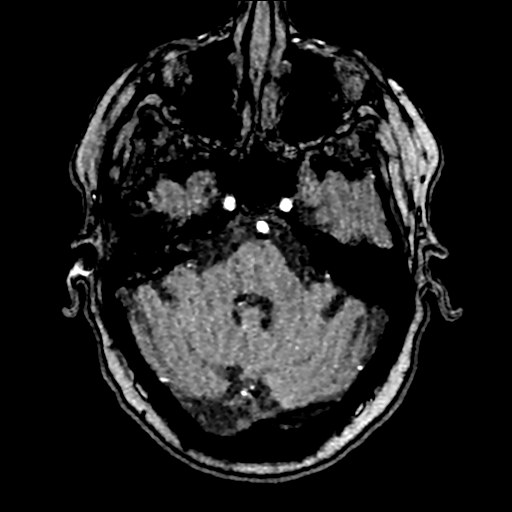
[im 66/205]
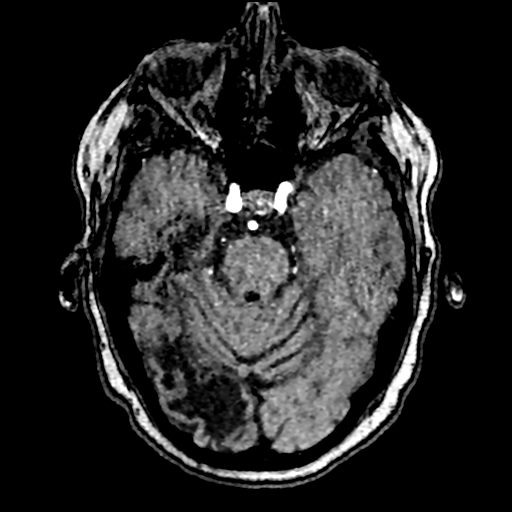
[im 92/205]
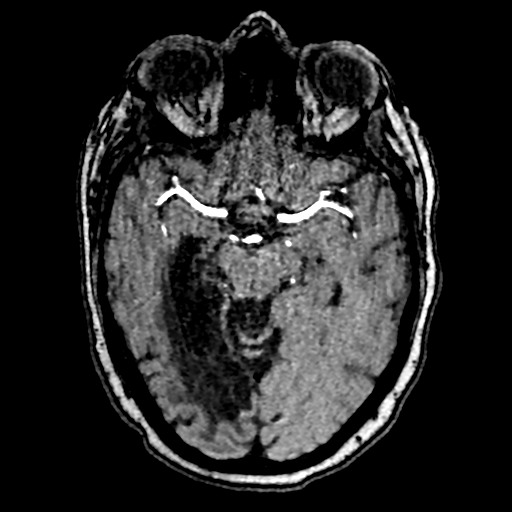
[im 105/205]
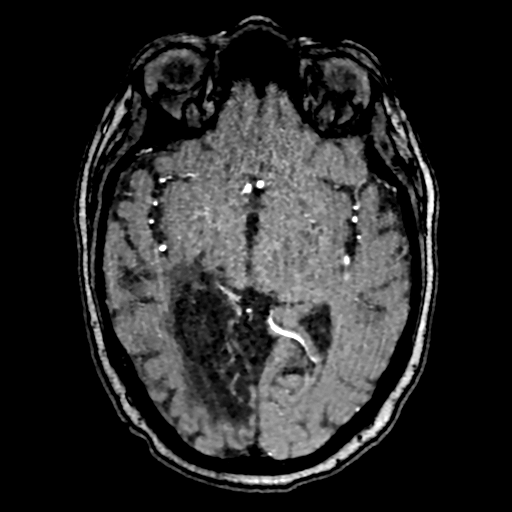
[im 118/205]
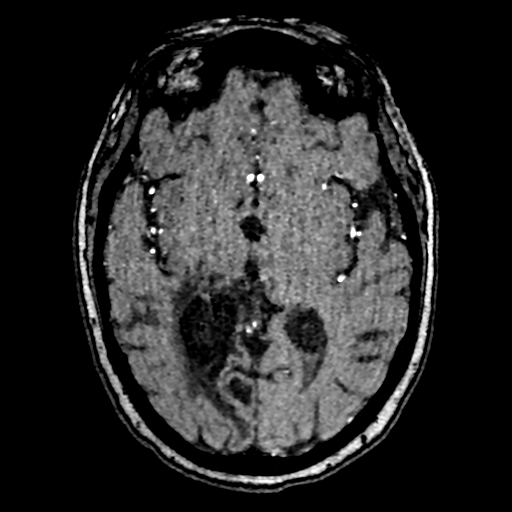
[im 144/205]
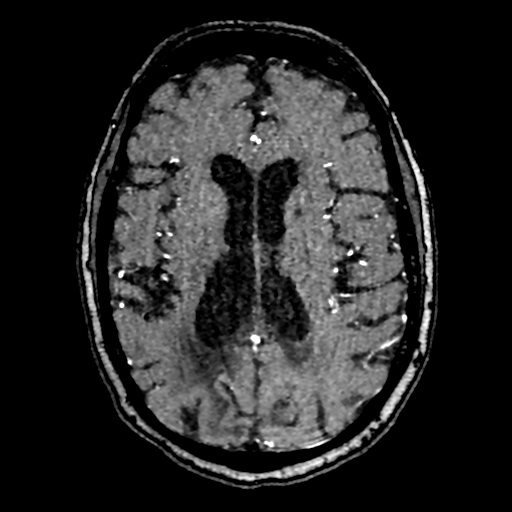
[im 170/205]
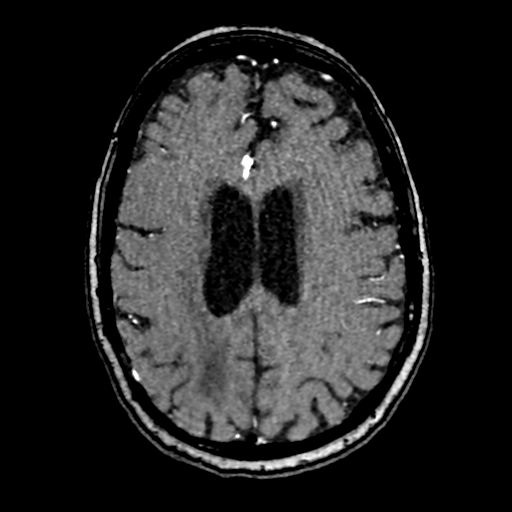
[im 174/205]
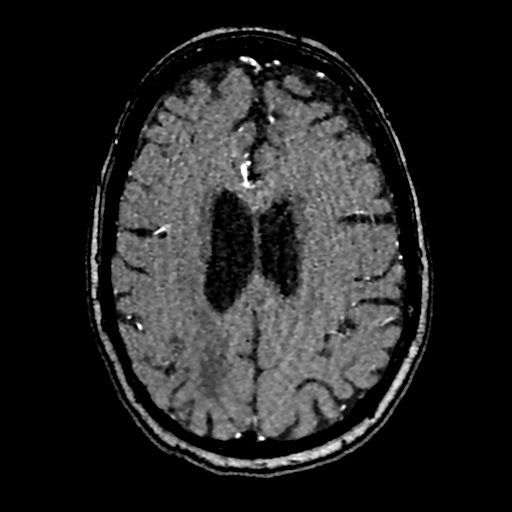
[im 196/205]
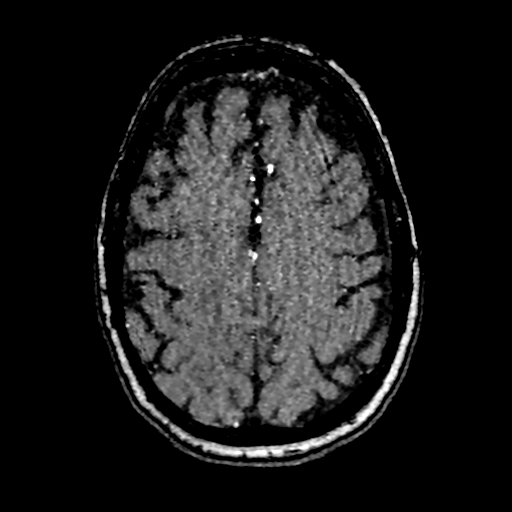

[19 of 48 positions shown; findings below may reference images not displayed]

FINDINGS: MRA NECK FINDINGS

There is no carotid or vertebral stenosis or other abnormality.

MRA HEAD FINDINGS

POSTERIOR CIRCULATION:

--Vertebral arteries: Normal

--Inferior cerebellar arteries: Normal.

--Basilar artery: Normal.

--Superior cerebellar arteries: Normal.

--Posterior cerebral arteries: Severe stenosis at the left P2-3
junction. Diminutive distal right PCA is likely chronic. Multifocal
bilateral atherosclerotic irregularity.

ANTERIOR CIRCULATION:

--Intracranial internal carotid arteries: Normal.

--Anterior cerebral arteries (ACA): Normal.

--Middle cerebral arteries (MCA): Normal.

ANATOMIC VARIANTS: None
IMPRESSION: 1. No emergent large vessel occlusion.
2. Severe stenosis at the left PCA P2-3 junction.
3. Multifocal atherosclerotic irregularity of the posterior cerebral
arteries.
4. Normal MRA of the neck.

## 2022-04-03 IMAGING — US US CAROTID DUPLEX BILAT
1 series · 13 of 24 positions shown · non-contrast
Comparison: Prior MRI from 03/07/2021.

CLINICAL DATA: Initial evaluation for acute CVA.

EXAM:
BILATERAL CAROTID DUPLEX ULTRASOUND
TECHNIQUE: Gray scale imaging, color Doppler and duplex ultrasound were
performed of bilateral carotid and vertebral arteries in the neck.

[Series 1: us carotid bilateral · 13 of 62 slices shown]
[im 1/62]
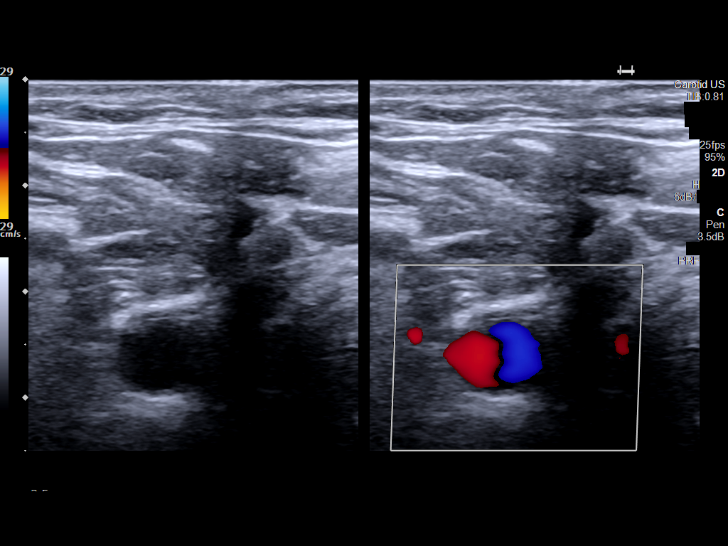
[im 6/62]
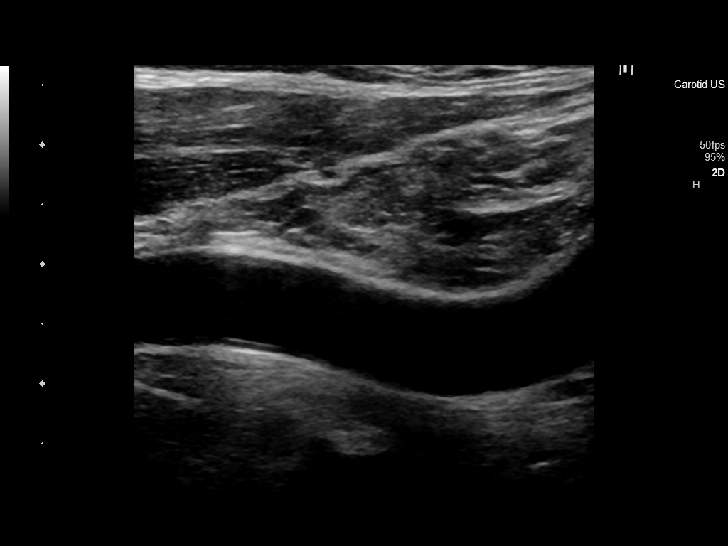
[im 11/62]
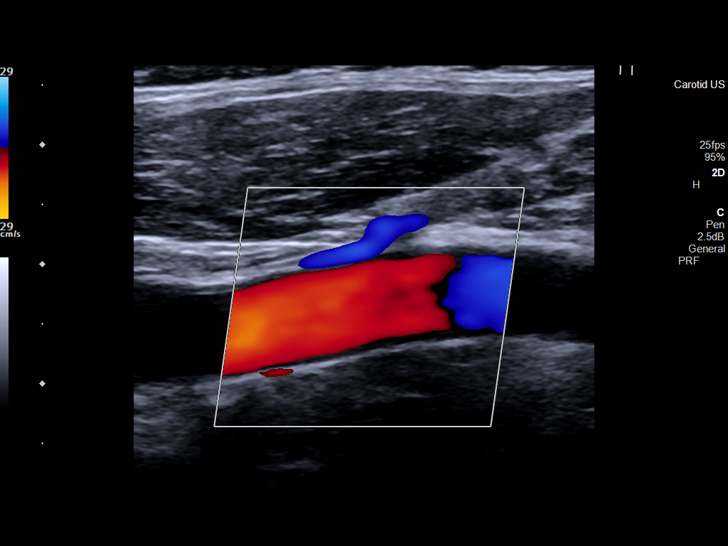
[im 16/62]
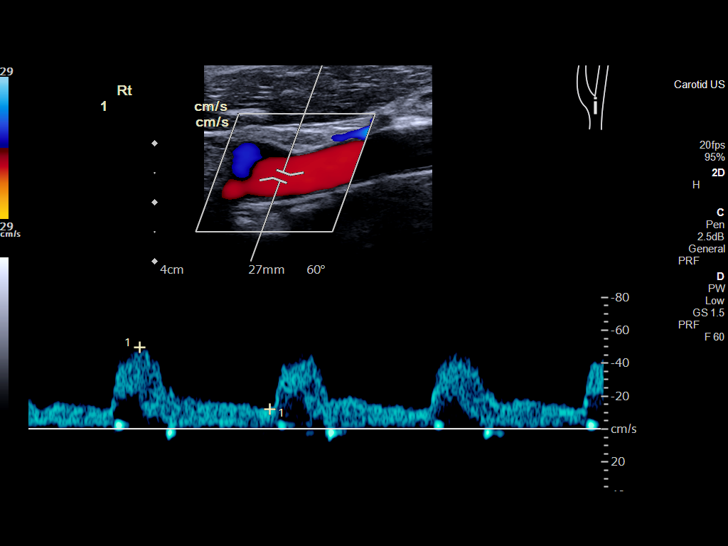
[im 22/62]
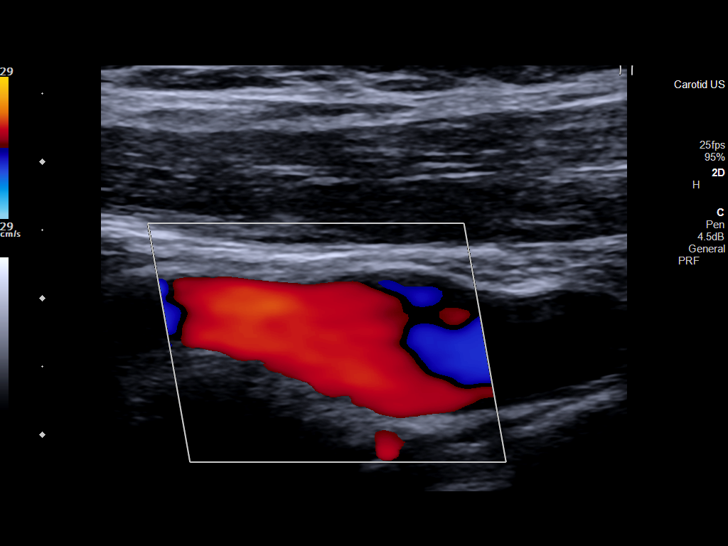
[im 27/62]
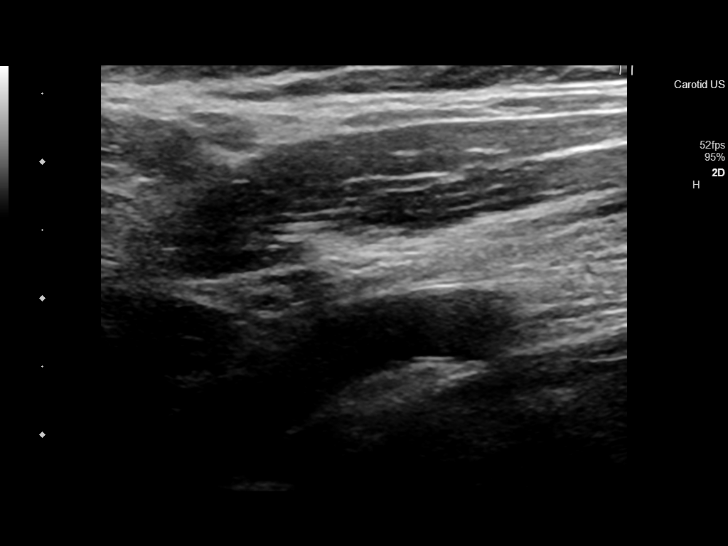
[im 32/62]
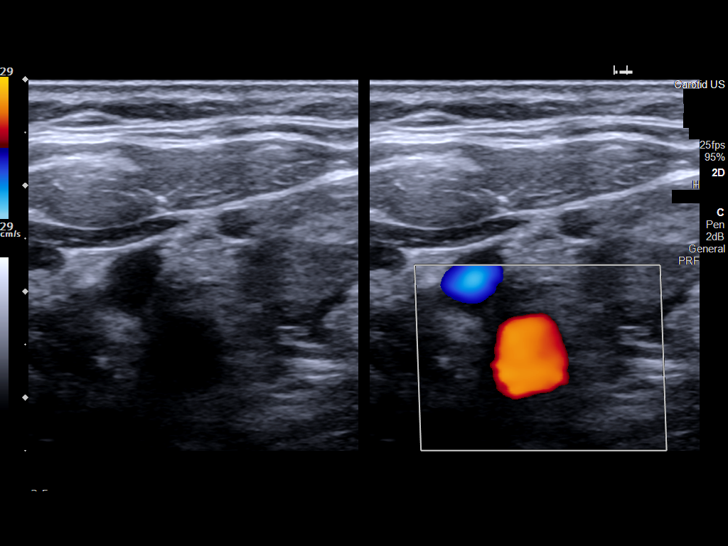
[im 35/62]
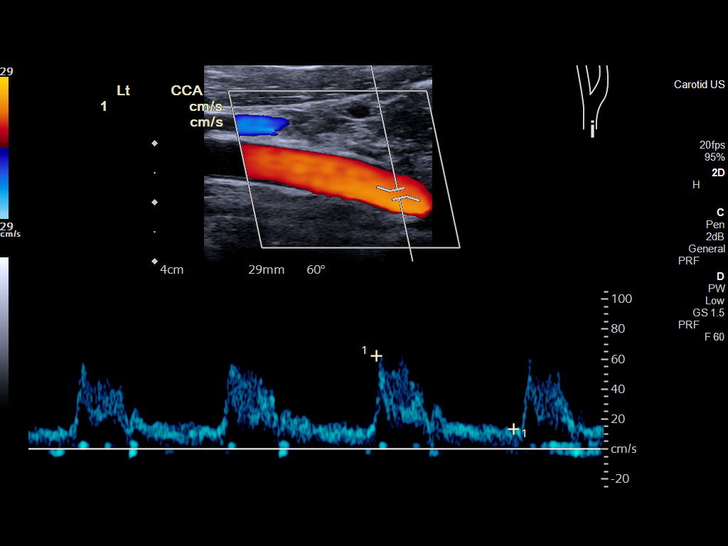
[im 40/62]
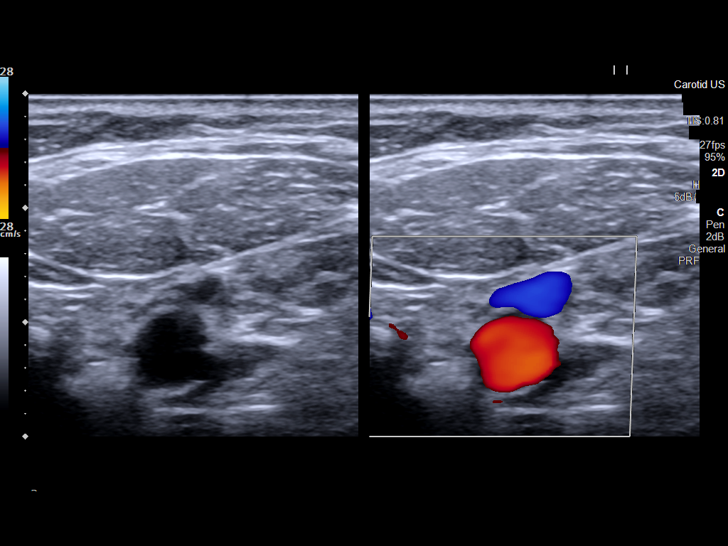
[im 46/62]
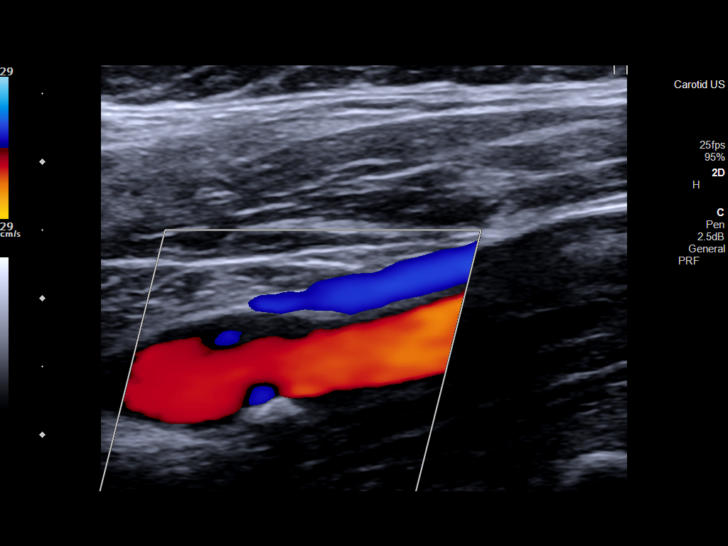
[im 51/62]
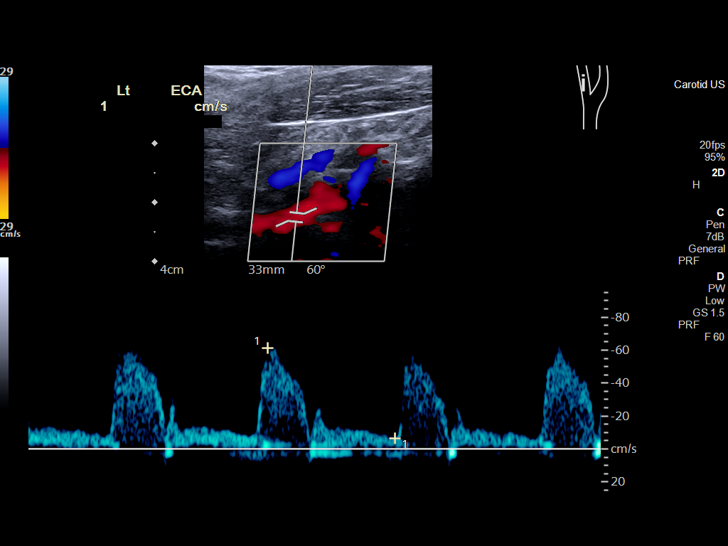
[im 56/62]
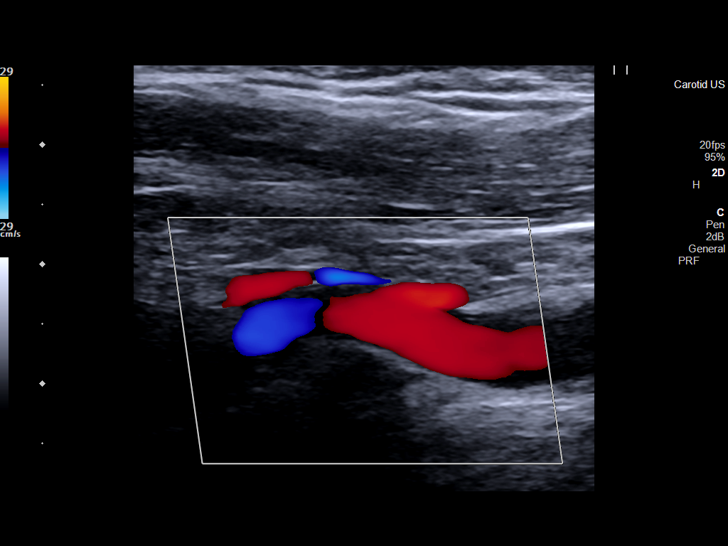
[im 62/62]
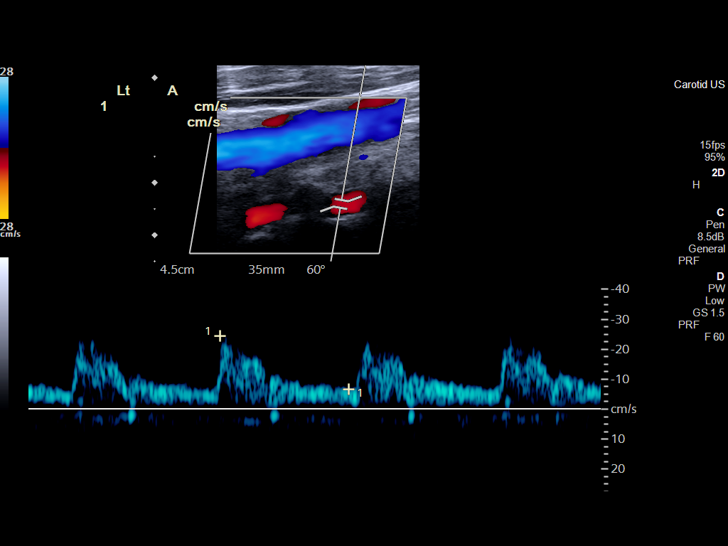

[13 of 24 positions shown; findings below may reference images not displayed]

FINDINGS: Criteria: Quantification of carotid stenosis is based on velocity
parameters that correlate the residual internal carotid diameter
with NASCET-based stenosis levels, using the diameter of the distal
internal carotid lumen as the denominator for stenosis measurement.

The following velocity measurements were obtained:

RIGHT

ICA: 83/26 cm/sec

CCA: 78/12 cm/sec

SYSTOLIC ICA/CCA RATIO:

ECA: 66 cm/sec

LEFT

ICA: 76/25 cm/sec

CCA: 64/19 cm/sec

SYSTOLIC ICA/CCA RATIO:

ECA: 61 cm/sec

RIGHT CAROTID ARTERY: Minimal intimal thickening seen within the
visualized right CCA without significant stenosis. Mild intimal
thickening extends to involve the right carotid bulb and proximal
right ICA. No significant echogenic plaque or narrowing. Visualized
right ICA widely patent distally.

RIGHT VERTEBRAL ARTERY:  Antegrade.

LEFT CAROTID ARTERY: Mild intimal thickening seen within the
visualized left CCA without significant stenosis. Small volume
echogenic plaque present within the left carotid bulb. No elevation
in peak systolic velocity to suggest hemodynamically significant
stenosis. Visualized left ICA widely patent distally.

LEFT VERTEBRAL ARTERY:  Antegrade.
IMPRESSION: 1. Mild for age intimal thickening and plaque involving the left
greater than right carotid bulbs, but with no sonographic evidence
for hemodynamically significant stenosis.
2. Patent vertebral arteries with antegrade flow.

## 2022-04-17 ENCOUNTER — Other Ambulatory Visit: Payer: Self-pay

## 2022-04-18 ENCOUNTER — Other Ambulatory Visit: Payer: Self-pay

## 2022-04-18 ENCOUNTER — Ambulatory Visit
Admission: RE | Admit: 2022-04-18 | Discharge: 2022-04-18 | Disposition: A | Discharge status: Home / Self Care | Payer: PPO | Source: Ambulatory Visit | Attending: Internal Medicine | Admitting: Internal Medicine

## 2022-04-18 DIAGNOSIS — Z1231 Encounter for screening mammogram for malignant neoplasm of breast: Secondary | ICD-10-CM | POA: Insufficient documentation

## 2022-04-21 ENCOUNTER — Other Ambulatory Visit: Payer: Self-pay

## 2022-04-21 MED ORDER — GABAPENTIN 100 MG PO CAPS
ORAL_CAPSULE | ORAL | 1 refills | Status: DC
  Filled 2022-07-26: qty 120, 30d supply, fill #0

## 2022-04-21 MED ORDER — INSULIN PEN NEEDLE 32G X 4 MM MISC
3 refills | Status: DC
  Filled 2022-04-23: qty 100, 25d supply, fill #0
  Filled 2022-06-13: qty 100, 25d supply, fill #1
  Filled 2022-08-07: qty 100, 25d supply, fill #2
  Filled 2022-08-25: qty 100, 25d supply, fill #3

## 2022-04-21 MED ORDER — GLUCOSE BLOOD VI STRP: ORAL_STRIP | 1 refills | Status: DC

## 2022-04-21 MED ORDER — KETOCONAZOLE 2 % EX CREA
1.0000 | TOPICAL_CREAM | Freq: Two times a day (BID) | CUTANEOUS | 10 refills | Status: DC
  Filled 2022-04-29: qty 60, 30d supply, fill #0

## 2022-04-21 MED ORDER — TOLTERODINE TARTRATE 2 MG PO TABS: 2.0000 mg | ORAL_TABLET | Freq: Two times a day (BID) | ORAL | 11 refills | Status: DC

## 2022-04-21 MED ORDER — ROSUVASTATIN CALCIUM 20 MG PO TABS: 20.0000 mg | ORAL_TABLET | Freq: Every day | ORAL | 1 refills | Status: DC

## 2022-04-21 MED ORDER — ACCU-CHEK FASTCLIX LANCETS MISC: 9 refills | Status: DC

## 2022-04-21 MED ORDER — GABAPENTIN 100 MG PO CAPS
ORAL_CAPSULE | ORAL | 0 refills | Status: DC
  Filled 2022-04-29: qty 120, 30d supply, fill #0

## 2022-04-21 MED ORDER — GABAPENTIN 300 MG PO CAPS
ORAL_CAPSULE | ORAL | 0 refills | Status: DC
  Filled 2022-07-26: qty 270, 28d supply, fill #0

## 2022-04-21 MED ORDER — METRONIDAZOLE 0.75 % EX CREA
1.0000 | TOPICAL_CREAM | Freq: Two times a day (BID) | CUTANEOUS | 10 refills | Status: AC
  Filled 2022-05-10: qty 45, 30d supply, fill #0

## 2022-04-21 MED ORDER — INSULIN DEGLUDEC FLEXTOUCH 100 UNIT/ML ~~LOC~~ SOPN
30.0000 [IU] | PEN_INJECTOR | Freq: Every day | SUBCUTANEOUS | 1 refills | Status: DC
  Filled 2022-04-21: qty 15, 50d supply, fill #0
  Filled 2022-06-05: qty 15, 50d supply, fill #1

## 2022-04-21 MED ORDER — TRULICITY 1.5 MG/0.5ML ~~LOC~~ SOAJ: 1.5000 mg | SUBCUTANEOUS | 1 refills | Status: DC

## 2022-04-21 MED ORDER — FLUTICASONE PROPIONATE 50 MCG/ACT NA SUSP: 2.0000 | Freq: Every day | NASAL | 7 refills | Status: DC

## 2022-04-23 ENCOUNTER — Other Ambulatory Visit: Payer: Self-pay

## 2022-04-23 DIAGNOSIS — I69359 Hemiplegia and hemiparesis following cerebral infarction affecting unspecified side: Secondary | ICD-10-CM | POA: Diagnosis not present

## 2022-04-23 DIAGNOSIS — G250 Essential tremor: Secondary | ICD-10-CM | POA: Diagnosis not present

## 2022-04-23 DIAGNOSIS — H547 Unspecified visual loss: Secondary | ICD-10-CM | POA: Diagnosis not present

## 2022-04-23 DIAGNOSIS — R269 Unspecified abnormalities of gait and mobility: Secondary | ICD-10-CM | POA: Diagnosis not present

## 2022-04-23 MED ORDER — CLOPIDOGREL BISULFATE 75 MG PO TABS
75.0000 mg | ORAL_TABLET | Freq: Every day | ORAL | 1 refills | Status: DC
  Filled 2022-04-23 – 2022-04-29 (×2): qty 90, 90d supply, fill #0
  Filled 2022-08-04: qty 90, 90d supply, fill #1
  Filled 2022-08-06: qty 37, 37d supply, fill #1
  Filled 2022-08-07: qty 90, 90d supply, fill #1

## 2022-04-23 MED ORDER — GABAPENTIN 300 MG PO CAPS
ORAL_CAPSULE | ORAL | 1 refills | Status: DC
  Filled 2022-04-23: qty 270, 95d supply, fill #0
  Filled 2022-10-28 (×2): qty 270, 90d supply, fill #1

## 2022-04-26 ENCOUNTER — Encounter: Payer: Self-pay | Admitting: Dermatology

## 2022-04-29 ENCOUNTER — Other Ambulatory Visit: Payer: Self-pay

## 2022-04-30 ENCOUNTER — Encounter: Payer: Self-pay | Admitting: Gastroenterology

## 2022-04-30 DIAGNOSIS — I152 Hypertension secondary to endocrine disorders: Secondary | ICD-10-CM | POA: Diagnosis not present

## 2022-04-30 DIAGNOSIS — Z794 Long term (current) use of insulin: Secondary | ICD-10-CM | POA: Diagnosis not present

## 2022-04-30 DIAGNOSIS — E1159 Type 2 diabetes mellitus with other circulatory complications: Secondary | ICD-10-CM | POA: Diagnosis not present

## 2022-04-30 DIAGNOSIS — E785 Hyperlipidemia, unspecified: Secondary | ICD-10-CM | POA: Diagnosis not present

## 2022-04-30 DIAGNOSIS — E559 Vitamin D deficiency, unspecified: Secondary | ICD-10-CM | POA: Diagnosis not present

## 2022-04-30 DIAGNOSIS — E1169 Type 2 diabetes mellitus with other specified complication: Secondary | ICD-10-CM | POA: Diagnosis not present

## 2022-04-30 DIAGNOSIS — E1142 Type 2 diabetes mellitus with diabetic polyneuropathy: Secondary | ICD-10-CM | POA: Diagnosis not present

## 2022-04-30 DIAGNOSIS — E039 Hypothyroidism, unspecified: Secondary | ICD-10-CM | POA: Diagnosis not present

## 2022-05-02 DIAGNOSIS — E1142 Type 2 diabetes mellitus with diabetic polyneuropathy: Secondary | ICD-10-CM | POA: Diagnosis not present

## 2022-05-08 ENCOUNTER — Other Ambulatory Visit: Payer: Self-pay

## 2022-05-09 ENCOUNTER — Other Ambulatory Visit: Payer: Self-pay

## 2022-05-09 MED ORDER — LEVOTHYROXINE SODIUM 125 MCG PO TABS
125.0000 ug | ORAL_TABLET | Freq: Every day | ORAL | 1 refills | Status: DC
  Filled 2022-05-09: qty 90, 90d supply, fill #0
  Filled 2022-08-04: qty 90, 90d supply, fill #1
  Filled 2022-08-06: qty 33, 33d supply, fill #1
  Filled 2022-08-07: qty 90, 90d supply, fill #1

## 2022-05-10 ENCOUNTER — Other Ambulatory Visit: Payer: Self-pay

## 2022-05-15 ENCOUNTER — Other Ambulatory Visit: Payer: Self-pay

## 2022-05-16 ENCOUNTER — Other Ambulatory Visit: Payer: Self-pay

## 2022-05-19 ENCOUNTER — Encounter: Payer: Self-pay | Admitting: Gastroenterology

## 2022-05-19 ENCOUNTER — Other Ambulatory Visit: Payer: Self-pay

## 2022-05-19 ENCOUNTER — Telehealth (INDEPENDENT_AMBULATORY_CARE_PROVIDER_SITE_OTHER): Payer: PPO | Admitting: Gastroenterology

## 2022-05-19 ENCOUNTER — Telehealth: Payer: PPO | Admitting: Gastroenterology

## 2022-05-19 DIAGNOSIS — K5909 Other constipation: Secondary | ICD-10-CM

## 2022-05-19 MED ORDER — PANTOPRAZOLE SODIUM 40 MG PO TBEC
40.0000 mg | DELAYED_RELEASE_TABLET | Freq: Every morning | ORAL | 1 refills | Status: DC
  Filled 2022-05-19: qty 90, 90d supply, fill #0
  Filled 2022-07-06: qty 90, 90d supply, fill #1
  Filled 2022-08-06: qty 24, 24d supply, fill #1
  Filled 2022-08-06 – 2022-08-07 (×2): qty 90, 90d supply, fill #1

## 2022-05-19 NOTE — Progress Notes (Signed)
Danielle Sear, MD [address]  [place]  [place], [place] [address]  Main: [phone]  Fax: [phone]    Gastroenterology Consultation Video Visit  Referring Provider:     Juluis Pitch, MD Primary Care Physician:  Gladstone Lighter, MD Primary Gastroenterologist:  Dr. Cephas Darby Reason for Consultation: Constipation        HPI:   [name] is a 72 y.o. female referred by Dr. Gladstone Lighter, MD  for consultation & management of chronic constipation  Virtual Visit Video Note  I connected with [name] on 05/19/22 at  1:30 PM EDT by video and verified that I am speaking with the correct person using two identifiers.   I discussed the limitations, risks, security and privacy concerns of performing an evaluation and management service by video and the availability of in person appointments. I also discussed with the patient that there may be a patient responsible charge related to this service. The patient expressed understanding and agreed to proceed.  Location of the Patient: Home  Location of the provider: Office   Persons participating in the visit: Patient and provider only   History of Present Illness: Ms. [name] is a 72 year old female with history of ileal resection in 1990s, history of pelvic floor dyssynergia, stroke resulting in left-sided hemiparesis is seen as a follow-up for chronic constipation.  Patient has been taking Linzess 145 mcg every other day.  She reports having hard lumpy bowel movements.  After many days, today, she had first bowel movement that was formed.  She drinks about 1.5 L of water daily.  She has been incorporating more fiber in her diet.  She wants to know if she can do Linzess 145 mcg 2 days in a row and then skip the following day.  She does not have any other concerns today.   NSAIDs: None  Antiplts/Anticoagulants/Anti thrombotics:   GI Procedures:  Colonoscopy 03/11/2019 DIAGNOSIS     A. Ileum, terminal,  endoscopic biopsy: Small intestinal mucosa with no significant pathologic finding.   B. Ileum, anastomosis, endoscopic biopsy: Intestinal mucosa with lamina propria fibrosis and ulceration, consistent with anastomotic site.   C. Colon, ascending, endoscopic biopsy: Colonic mucosa with no significant pathologic finding.   D. Colon, transverse, endoscopic biopsy: Colonic mucosa with no significant pathologic finding.  E. Colon, descending, endoscopic biopsy: Colonic mucosa with no significant pathologic finding.  F. Colon, rectosigmoid, endoscopic biopsy: Colonic mucosa with no significant pathologic finding.    Comment: The patient's history of Crohn's disease is noted. All parts are negative for granulomas or dysplasia  Past Medical History:  Diagnosis Date   Abdominal pain 12/06/2013   Abnormality of gait 01/26/2013   Altered bowel habits 12/11/2017   Bruises easily    Constipation    Crohn's disease ([place])    Depression    Dizziness and giddiness 01/26/2013   Fecal incontinence alternating with constipation 12/11/2017   GERD (gastroesophageal reflux disease)    Hemianopia    left   Hemiparesis and alteration of sensations as late effects of stroke ([place]) 01/26/2013   Hyperlipidemia    Hypertension    Hypothyroidism    Lump in female breast    Panic disorder    PONV (postoperative nausea and vomiting)    Stroke ([place]) 2009   Tremor, essential 04/24/2020   Type II diabetes mellitus ([place])     Past Surgical History:  Procedure Laterality Date   ANAL FISSURE REPAIR  2008   APPENDECTOMY  1986  BREAST BIOPSY Left 2006   neg   BREAST LUMPECTOMY Right 1990's   CESAREAN SECTION  1981; [place] [facility]  05/1991   TUBAL LIGATION  1983     Current Outpatient Medications:    aspirin 325 MG tablet, Take 1 tablet (325 mg total) by mouth daily., Disp: 30 tablet, Rfl: 2   clopidogrel (PLAVIX) 75 MG tablet, Take 1 tablet (75 mg total) by  mouth daily. (Patient taking differently: Take 75 mg by mouth every evening.), Disp: 30 tablet, Rfl: 0   clopidogrel (PLAVIX) 75 MG tablet, Take 1 tablet (75 mg total) by mouth daily., Disp: 90 tablet, Rfl: 1   Dulaglutide (TRULICITY) 1.5 0000000 SOPN, Inject 1.5 mg into the skin every 7 (seven) days., Disp: 2 mL, Rfl: 3   Dulaglutide (TRULICITY) 1.5 0000000 SOPN, Inject 1.5 mg into the skin every 7 (seven) days., Disp: 2 mL, Rfl: 1   gabapentin (NEURONTIN) 100 MG capsule, Take 200 mg by mouth 2 (two) times daily., Disp: , Rfl:    gabapentin (NEURONTIN) 100 MG capsule, Take 1 capsule (100 mg total) by mouth 2 (two) times daily for 7 days, THEN 2 capsules (200 mg total) 2 (two) times daily, Disp: 120 capsule, Rfl: 0   gabapentin (NEURONTIN) 100 MG capsule, Take 1 capsule (100 mg total) by mouth 2 (two) times daily for 7 days, THEN 2 capsules (200 mg total) 2 (two) times daily, Disp: 120 capsule, Rfl: 1   gabapentin (NEURONTIN) 300 MG capsule, Take 1 capsule (300 mg total) by mouth 2 (two) times daily for 14 days, THEN 1 capsule (300 mg total) 3 (three) times daily., Disp: 270 capsule, Rfl: 0   gabapentin (NEURONTIN) 300 MG capsule, Take 1 capsule (300 mg total) by mouth 2 (two) times daily for 14 days, THEN 1 capsule (300 mg total) 3 (three) times daily., Disp: 270 capsule, Rfl: 1   glucose blood test strip, Use to check blood sugar three times daily, Disp: 100 each, Rfl: 1   insulin degludec (TRESIBA FLEXTOUCH) 100 UNIT/ML FlexTouch Pen, Inject 30 Units into the skin in the morning., Disp: , Rfl:    Insulin Degludec FlexTouch 100 UNIT/ML SOPN, Inject 30 Units into the skin daily., Disp: 15 mL, Rfl: 1   Insulin Pen Needle 32G X 4 MM MISC, Use daily for insulin administration, Disp: 100 each, Rfl: 3   ketoconazole (NIZORAL) 2 % cream, Apply topically to aa's of groin and under arms qd/bid prn for rash, Disp: 60 g, Rfl: 11   ketoconazole (NIZORAL) 2 % cream, Apply 1 Application topically to groin and  under arms 1 (one) to 2 (two) times daily as needed for rash., Disp: 60 g, Rfl: 10   levothyroxine (SYNTHROID) 125 MCG tablet, Take 125 mcg by mouth daily before breakfast., Disp: , Rfl:    levothyroxine (SYNTHROID) 125 MCG tablet, Take 1 tablet (125 mcg total) by mouth daily. Take on an empty stomach with a glass of water at least 30-60 minutes before breakfast., Disp: 90 tablet, Rfl: 1   linaclotide (LINZESS) 72 MCG capsule, Take 1 capsule (72 mcg total) by mouth daily before breakfast., Disp: 30 capsule, Rfl: 0   metroNIDAZOLE (METROCREAM) 0.75 % cream, Apply qd/bid to face for rosacea, Disp: 45 g, Rfl: 11   metroNIDAZOLE (METROCREAM) 0.75 % cream, Apply 1 Application topically to face 1 (one) to 2 (two) times daily for rosacea., Disp: 45 g, Rfl: 10   pantoprazole (PROTONIX) 40  MG tablet, Take 40 mg by mouth in the morning., Disp: , Rfl:    pantoprazole (PROTONIX) 40 MG tablet, Take 1 tablet (40 mg total) by mouth in the morning., Disp: 90 tablet, Rfl: 1   ramipril (ALTACE) 2.5 MG capsule, Take 2.5 mg by mouth every evening., Disp: , Rfl:    rosuvastatin (CRESTOR) 20 MG tablet, Take 1 tablet (20 mg total) by mouth daily., Disp: 30 tablet, Rfl: 1   Family History  Problem Relation Age of Onset   Heart disease Mother    Heart disease Father    Diabetes Brother    Diabetes Brother    Migraines Son      Social History   Tobacco Use   Smoking status: Former    Packs/day: 1.00    Years: 20.00    Additional pack years: 0.00    Total pack years: 20.00    Types: Cigarettes    Quit date: 04/16/1993    Years since quitting: 29.1   Smokeless tobacco: Never  Substance Use Topics   Alcohol use: Not Currently    Comment: "haven't drank since early 1980's"   Drug use: No    Allergies as of 05/19/2022 - Review Complete 02/28/2022  Allergen Reaction Noted   Codeine Other (See Comments) 04/17/2011   Morphine and related Nausea And Vomiting and Other (See Comments) 04/17/2011   Baclofen  Nausea Only and Other (See Comments) 01/26/2013   Metformin hcl Other (See Comments) 01/30/2020   Penicillins Other (See Comments) 12/06/2013   Sulfamethoxazole Itching 12/10/2017   Elemental sulfur Itching 04/17/2011    Imaging Studies: Reviewed  Assessment and Plan:   [name] is a 72 y.o. female with history of stroke resulting in left-sided hemiparesis, ileal resection in 1990s, with ileoileal anastomosis, no evidence of active Crohn's disease is seen in consultation for chronic constipation. Patient has history of pelvic floor dyssynergy   Continue high-fiber diet Increase amount of water intake Continue Linzess 145 mcg daily   Follow Up Instructions:   I discussed the assessment and treatment plan with the patient. The patient was provided an opportunity to ask questions and all were answered. The patient agreed with the plan and demonstrated an understanding of the instructions.   The patient was advised to call back or seek an in-person evaluation if the symptoms worsen or if the condition fails to improve as anticipated.  I provided 21 minutes of face-to-face time during this encounter.   Follow up as needed   Cephas Darby, MD

## 2022-05-21 ENCOUNTER — Ambulatory Visit: Payer: HMO | Admitting: Dermatology

## 2022-05-27 ENCOUNTER — Encounter: Payer: Self-pay | Admitting: Physical Therapy

## 2022-05-27 ENCOUNTER — Ambulatory Visit: Payer: PPO | Attending: Neurology | Admitting: Physical Therapy

## 2022-05-27 DIAGNOSIS — R262 Difficulty in walking, not elsewhere classified: Secondary | ICD-10-CM

## 2022-05-27 DIAGNOSIS — R278 Other lack of coordination: Secondary | ICD-10-CM | POA: Diagnosis not present

## 2022-05-27 DIAGNOSIS — R2689 Other abnormalities of gait and mobility: Secondary | ICD-10-CM | POA: Diagnosis not present

## 2022-05-27 DIAGNOSIS — R269 Unspecified abnormalities of gait and mobility: Secondary | ICD-10-CM | POA: Diagnosis not present

## 2022-05-27 DIAGNOSIS — M6281 Muscle weakness (generalized): Secondary | ICD-10-CM | POA: Diagnosis not present

## 2022-05-27 DIAGNOSIS — R2681 Unsteadiness on feet: Secondary | ICD-10-CM

## 2022-05-27 NOTE — Therapy (Signed)
OUTPATIENT PHYSICAL THERAPY NEURO EVALUATION   Patient Name: [name] MRN: [id number] DOB:[birth year], 72 y.o., female Today's Date: 05/27/2022   PCP:   Gladstone Lighter, MD   REFERRING PROVIDER: Anabel Bene, MD   END OF SESSION:   Past Medical History:  Diagnosis Date   Abdominal pain 12/06/2013   Abnormality of gait 01/26/2013   Altered bowel habits 12/11/2017   Bruises easily    Constipation    Crohn's disease ([place])    Depression    Dizziness and giddiness 01/26/2013   Fecal incontinence alternating with constipation 12/11/2017   GERD (gastroesophageal reflux disease)    Hemianopia    left   Hemiparesis and alteration of sensations as late effects of stroke ([place]) 01/26/2013   Hyperlipidemia    Hypertension    Hypothyroidism    Lump in female breast    Panic disorder    PONV (postoperative nausea and vomiting)    Stroke ([place]) 2009   Tremor, essential 04/24/2020   Type II diabetes mellitus ([place])    Past Surgical History:  Procedure Laterality Date   ANAL FISSURE REPAIR  2008   APPENDECTOMY  1986   BREAST BIOPSY Left 2006   neg   BREAST LUMPECTOMY Right 1990's   CESAREAN SECTION  1981; [place]  05/1991   TUBAL LIGATION  1983   Patient Active Problem List   Diagnosis Date Noted   Bone spur 06/13/2021   Acute CVA (cerebrovascular accident) 03/07/2021   CVA (cerebral vascular accident) 11/01/2020   Chronic pain 04/25/2020   Chronic constipation 04/25/2020   Gastro-esophageal reflux disease without esophagitis 04/25/2020   Hardening of the aorta (main artery of the heart) 04/25/2020   History of iron deficiency anemia 04/25/2020   Hypercholesteremia 04/25/2020   Hypertension 04/25/2020   Personal history of transient ischemic attack (TIA), and cerebral infarction without residual deficits 04/25/2020   Pulmonary emphysema 04/25/2020   Vitamin D deficiency 04/25/2020   Tremor, essential 04/24/2020    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  Rationale for Evaluation and Treatment: Rehabilitation  SUBJECTIVE:  SUBJECTIVE STATEMENT: Pt has had one major stroke and 5 mini strokes. Pt is here since her doctor wants her to work on her strength and balance. Pt has hemianopsia with blindness inferior, left side and superior. Pt initially had stroke in 2009 and was previously able to walk for distances. Pt has 650 feet driveway that is gravel and used to walk up and down with the cane. Pt has issues with depth perception. Pt is unable to tell angles of concrete and holes in the ground. Pt had service dogs that are retired, one for guidance and one for helping with picking up and retrieving items. Pt had most recent stroke in January 2023 that further limited her field of vision.   Pt accompanied by: self  PERTINENT HISTORY: Pt has had one major stroke and 5 mini strokes since 2009. Pt is here since her doctor wants her to work on her strength and balancea nd she also wants to improve in these categories. Pt has hemianopsia with blindness inferior, left side and superior. Pt initially had stroke in 2009 and was previously able to walk for long distances. Pt has 650 feet driveway that is gravel and used to walk up and  down with the cane. Pt has issues with depth perception. Pt is unable to tell angles of concrete and holes in the ground. Pt has service dogs that are retired, one for guidance and one for helping with picking up and retrieving items. Pt had most recent stroke in January 2023 that further limited her field of vision.  Patient wants to work diligently on her strength, balance, mobility, and in order to improve her ability to ambulate on various surfaces in various scenarios.  Patient wanted to come to outpatient services to utilize equipment to improve her strength and a greater capacity than home health was able to assist her with.  She is doing home exercise program involving mostly standing with upper extremity support and activities to improve her strength.  Patient reports she is very nervous to ambulate without contact-guard assist and gait belt without being able to hold onto a wall or steady surface in order to provide her with tactile cues for the areas around her.  PAIN:  Are you having pain? Yes: NPRS scale: 0/10 Pain location: left toe Pain description: pressure from toe on the left pushing up  Aggravating factors: no telling  Relieving factors: no telling   PRECAUTIONS: Fall  WEIGHT BEARING RESTRICTIONS: No  FALLS: Has patient fallen in last 6 months? No  LIVING ENVIRONMENT: Lives with: lives with their family and lives with their spouse Lives in: House/apartment Stairs: No Has following equipment at home: Single point cane, Environmental consultant - 4 wheeled, Wheelchair (manual), Grab bars, and Ramped entry and power wheelchair  PLOF: Independent with household mobility with device, Independent with community mobility with device, Independent with homemaking with ambulation, and    PATIENT GOALS: Improve gait, improve strength, walk on unsteady surfaces  OBJECTIVE:   DIAGNOSTIC FINDINGS: MRI Brain without contrast  03/07/2021:  1. Small acute ischemic nonhemorrhagic infarcts involving the  left thalamus/midbrain and left subinsular white matter. 2. Large remote right PCA distribution infarct. 3. Underlying age-related cerebral atrophy with moderate chronic microvascular ischemic disease with several additional remote lacunar infarcts involving the bilateral basal ganglia and right thalamus.   COGNITION: Overall cognitive status: Within functional limits for tasks assessed   SENSATION: Not tested  COORDINATION: Not tested      POSTURE:  slumped posture seated in  wheelchair    LOWER EXTREMITY MMT:    MMT Right Eval Left Eval  Hip flexion 4+ 3+  Hip extension    Hip abduction 4+ 4  Hip adduction 4+ 4  Hip internal rotation    Hip external rotation    Knee flexion 4+ 3+  Knee extension 4+ 3+  Ankle dorsiflexion    Ankle plantarflexion    Ankle inversion    Ankle eversion    (Blank rows = not tested)  BED MOBILITY:  Rolling to Right Modified independence  TRANSFERS: uses UE assist but no device.  Assistive device utilized:  UE assist    Sit to stand:  UE assist    RAMP:  Level of Assistance: Total A Assistive device utilized: Wheelchair (manual) Ramp Comments:       GAIT: Gait pattern:  No assessed visit 1  Distance walked:  Assistive device utilized:  small based cane with forearm support Level of assistance:  assess visit 2    FUNCTIONAL TESTS:  5 times sit to stand: assess visit 2  10 meter walk test: assess visit 2   PATIENT SURVEYS:  FOTO assess visit 2   TODAY'S TREATMENT:                                                                                                                              DATE: 05/27/22     PATIENT EDUCATION: Education details: POC Person educated: Patient Education method: Explanation Education comprehension: verbalized understanding  HOME EXERCISE PROGRAM: Pt performs leg lifts and squats with UE support   GOALS: Goals reviewed with patient? Yes  SHORT TERM GOALS: Target date:  06/24/2022       Patient will be independent in home exercise program to improve strength/mobility for better functional independence with ADLs. Baseline: No HEP currently  Goal status: INITIAL   LONG TERM GOALS: Target date: 08/19/2022   1.  Patient (> 43 years old) will complete five times sit to stand test in < 15 seconds indicating an increased LE strength and improved balance. Baseline:  Testy second visit  Goal status: INITIAL  2.  Patient will increase FOTO score by 8 or more points  to demonstrate statistically significant improvement in mobility and quality of life.  Baseline: Test second visit  Goal status: INITIAL      3.   Patient will increase 10 meter walk test to >.52m/s as to improve gait speed for better community ambulation and to reduce fall risk. Baseline: Test visit 2  Goal status: INITIAL  4.   Patient will improve ability to ambulate on uneven surfaces as evidenced by ability to ambulate over simulated uneven terrain in clinic prior to improve independence with ambulation. Baseline: Patient ambulating on uneven terrain Goal status: INITIAL   ASSESSMENT:  CLINICAL IMPRESSION: Patient is a pleasant and verbose 72 year old female who presents to physical therapy for balance, mobility, and strength training.  Patient was previously getting physical therapy  and home health services but felt she could not longer improve her strength in the setting and wanted to utilize equipment of outpatient services.  Patient has a history of multiple strokes including most recent stroke in January 2023 which further affected her vision.  Patient only has vision and right lateral visual field and has anoxia and superior, left, and inferior visual fields.  At home patient utilizes cane with forearm support community ambulation and also utilizes a lot of furniture and cabinets encounters for safe ambulation within the home.  Patient also has manual and power wheelchair which she  uses for further mobility purposes.  Patient presented physical therapy this date with manual wheelchair.  Patient has some anxiety with ambulating as well as anxiety with fear of falling.  As result ambulation was not assessed this date but upon detailed discussion patient reports she should be comfortable ambulating with her cane as long as she has close contact-guard assist her to ensure she will not walk into anything and will not lose her balance.  Patient will benefit from skilled physical therapy services in order to improve her lower extremity strength, improve her ability to ambulate with greater independence and greater variety of settings, in order to improve her overall quality of life and prevent falls.  OBJECTIVE IMPAIRMENTS: Abnormal gait, decreased activity tolerance, decreased balance, decreased endurance, decreased strength, decreased safety awareness, hypomobility, impaired flexibility, and impaired UE functional use.   ACTIVITY LIMITATIONS: lifting, bending, standing, stairs, transfers, bed mobility, and locomotion level  PARTICIPATION LIMITATIONS: meal prep, cleaning, shopping, and community activity  PERSONAL FACTORS: Time since onset of injury/illness/exacerbation and 3+ comorbidities: diabetes, HTN, Multiple previous strokes   are also affecting patient's functional outcome.   REHAB POTENTIAL: Fair secondary to time since onset and severely of visual deficits and L UE funcitonal use   CLINICAL DECISION MAKING: Stable/uncomplicated  EVALUATION COMPLEXITY: Low  PLAN:  PT FREQUENCY: 2x/week  PT DURATION: 12 weeks  PLANNED INTERVENTIONS: Therapeutic exercises, Therapeutic activity, Neuromuscular re-education, Balance training, Gait training, Patient/Family education, Self Care, Joint mobilization, Stair training, Moist heat, Manual therapy, and Re-evaluation  PLAN FOR NEXT SESSION: 10 MWT with close CGA for pt safety and comfort, 5 x STS, FOTO, HEP and strength and balance  training initiation    Particia Lather, PT 05/27/2022, 8:36 AM

## 2022-05-28 ENCOUNTER — Ambulatory Visit: Payer: HMO | Admitting: Dermatology

## 2022-05-29 ENCOUNTER — Ambulatory Visit: Payer: PPO

## 2022-05-29 DIAGNOSIS — M6281 Muscle weakness (generalized): Secondary | ICD-10-CM

## 2022-05-29 DIAGNOSIS — R269 Unspecified abnormalities of gait and mobility: Secondary | ICD-10-CM | POA: Diagnosis not present

## 2022-05-29 DIAGNOSIS — R278 Other lack of coordination: Secondary | ICD-10-CM

## 2022-05-29 DIAGNOSIS — R262 Difficulty in walking, not elsewhere classified: Secondary | ICD-10-CM

## 2022-05-29 DIAGNOSIS — R2681 Unsteadiness on feet: Secondary | ICD-10-CM

## 2022-05-29 NOTE — Therapy (Signed)
OUTPATIENT PHYSICAL THERAPY NEURO TREATMENT NOTE   Patient Name: [name] MRN: [id number] DOB:17-Jan-1951, 72 y.o., female Today's Date: 05/29/2022   PCP:   Gladstone Lighter, MD   REFERRING PROVIDER: Anabel Bene, MD   END OF SESSION:  PT End of Session - 05/29/22 1415     Visit Number 2    Number of Visits 24    Date for PT Re-Evaluation 08/19/22    Progress Note Due on Visit 10    PT Start Time [id number]    PT Stop Time [id number]    PT Time Calculation (min) 50 min    Equipment Utilized During Treatment Gait belt    Activity Tolerance Patient tolerated treatment well    Behavior During Therapy [facility] for tasks assessed/performed             Past Medical History:  Diagnosis Date   Abdominal pain 12/06/2013   Abnormality of gait 01/26/2013   Altered bowel habits 12/11/2017   Bruises easily    Constipation    Crohn's disease    Depression    Dizziness and giddiness 01/26/2013   Fecal incontinence alternating with constipation 12/11/2017   GERD (gastroesophageal reflux disease)    Hemianopia    left   Hemiparesis and alteration of sensations as late effects of stroke 01/26/2013   Hyperlipidemia    Hypertension    Hypothyroidism    Lump in female breast    Panic disorder    PONV (postoperative nausea and vomiting)    Stroke 2009   Tremor, essential 04/24/2020   Type II diabetes mellitus    Past Surgical History:  Procedure Laterality Date   ANAL FISSURE REPAIR  2008   APPENDECTOMY  1986   BREAST BIOPSY Left 2006   neg   BREAST LUMPECTOMY Right 1990's   CESAREAN SECTION  1981; [place]  05/1991   TUBAL LIGATION  1983   Patient Active Problem List   Diagnosis Date Noted   Bone spur 06/13/2021   Acute CVA (cerebrovascular accident) 03/07/2021   CVA (cerebral vascular accident) 11/01/2020   Chronic pain 04/25/2020   Chronic constipation 04/25/2020   Gastro-esophageal reflux disease without esophagitis  04/25/2020   Hardening of the aorta (main artery of the heart) 04/25/2020   History of iron deficiency anemia 04/25/2020   Hypercholesteremia 04/25/2020   Hypertension 04/25/2020   Personal history of transient ischemic attack (TIA), and cerebral infarction without residual deficits 04/25/2020   Pulmonary emphysema 04/25/2020   Vitamin D deficiency 04/25/2020   Tremor, essential 04/24/2020   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   Right lower quadrant abdominal pain 08/18/2018   Spastic hemiplegia affecting nondominant side ([place]) 02/03/2014   Diabetes mellitus ([place]) 09/07/2013   Hypothyroidism 09/07/2013   Type 2 diabetes mellitus without complications 0000000   Hemiparesis and alteration of sensations as late effects of stroke ([place]) 01/26/2013   Unsteady gait 01/26/2013   Dizziness and giddiness 01/26/2013    ONSET DATE: 2009  REFERRING DIAG: Z86.73 (ICD-10-CM) - History of stroke   THERAPY DIAG:  Muscle weakness (generalized)  Other lack of coordination  Difficulty in walking, not elsewhere classified  Unsteadiness on feet  Rationale for Evaluation and Treatment: Rehabilitation  SUBJECTIVE:  SUBJECTIVE STATEMENT: Pt reports no pain, no updates. She is doing some of her exercises at home given by previous PT. She reports she does not like to be pushed in her wheelchair, and that she prefers to propel herself. This is one of her goals.  Pt accompanied by: self  PERTINENT HISTORY: Pt has had one major stroke and 5 mini strokes since 2009. Pt is here since her doctor wants her to work on her strength and balancea nd she also wants to improve in these categories. Pt has hemianopsia with blindness inferior, left side and superior. Pt initially had stroke in 2009 and was previously able to  walk for long distances. Pt has 650 feet driveway that is gravel and used to walk up and down with the cane. Pt has issues with depth perception. Pt is unable to tell angles of concrete and holes in the ground. Pt has service dogs that are retired, one for guidance and one for helping with picking up and retrieving items. Pt had most recent stroke in January 2023 that further limited her field of vision.  Patient wants to work diligently on her strength, balance, mobility, and in order to improve her ability to ambulate on various surfaces in various scenarios.  Patient wanted to come to outpatient services to utilize equipment to improve her strength and a greater capacity than home health was able to assist her with.  She is doing home exercise program involving mostly standing with upper extremity support and activities to improve her strength.  Patient reports she is very nervous to ambulate without contact-guard assist and gait belt without being able to hold onto a wall or steady surface in order to provide her with tactile cues for the areas around her.  PAIN:  Are you having pain? Yes: NPRS scale: 0/10 Pain location: left toe Pain description: pressure from toe on the left pushing up  Aggravating factors: no telling  Relieving factors: no telling   PRECAUTIONS: Fall  WEIGHT BEARING RESTRICTIONS: No  FALLS: Has patient fallen in last 6 months? No  LIVING ENVIRONMENT: Lives with: lives with their family and lives with their spouse Lives in: House/apartment Stairs: No Has following equipment at home: Single point cane, Environmental consultant - 4 wheeled, Wheelchair (manual), Grab bars, and Ramped entry and power wheelchair  PLOF: Independent with household mobility with device, Independent with community mobility with device, Independent with homemaking with ambulation, and    PATIENT GOALS: Improve gait, improve strength, walk on unsteady surfaces  OBJECTIVE:   DIAGNOSTIC FINDINGS: MRI Brain without  contrast  03/07/2021:  1. Small acute ischemic nonhemorrhagic infarcts involving the left thalamus/midbrain and left subinsular white matter. 2. Large remote right PCA distribution infarct. 3. Underlying age-related cerebral atrophy with moderate chronic microvascular ischemic disease with several additional remote lacunar infarcts involving the bilateral basal ganglia and right thalamus.   COGNITION: Overall cognitive status: Within functional limits for tasks assessed   SENSATION: Not tested  COORDINATION: Not tested      POSTURE:  slumped posture seated in wheelchair    LOWER EXTREMITY MMT:    MMT Right Eval Left Eval  Hip flexion 4+ 3+  Hip extension    Hip abduction 4+ 4  Hip adduction 4+ 4  Hip internal rotation    Hip external rotation    Knee flexion 4+ 3+  Knee extension 4+ 3+  Ankle dorsiflexion    Ankle plantarflexion    Ankle inversion    Ankle eversion    (  Blank rows = not tested)  BED MOBILITY:  Rolling to Right Modified independence  TRANSFERS: uses UE assist but no device.  Assistive device utilized:  UE assist    Sit to stand:  UE assist    RAMP:  Level of Assistance: Total A Assistive device utilized: Wheelchair (manual) Ramp Comments:       GAIT: Gait pattern:  No assessed visit 1  Distance walked:  Assistive device utilized:  small based cane with forearm support Level of assistance:  assess visit 2    FUNCTIONAL TESTS:  5 times sit to stand: assess visit 2  10 meter walk test: assess visit 2   PATIENT SURVEYS:  FOTO assess visit 2   TODAY'S TREATMENT:                                                                                                                              DATE: 05/29/22  TA: FOTO: 45  5xSTS: 40 sec with pull to stand. PT rates medium. PT provides close CGA throughout as pt expresses concern/decreased confidence with slipping on floor with test. PT provides encouragement throughout.  Pt reports current  LLE brace does not fit well and reports it is increasing supination of L foot/ankle. Pt seated at rest, L foot does exhibit increased supination in current brace. PT filling out form to send to pt physician for referral to orthotist regarding LLE brace. PT reviewed with pt purpose of LE bracing and possible benefit of working with an orthotist to address her concerns.   Seated: March 2x15 each LE. Pt asks PT, "What if I don't want to do it?" when instructed in performing intervention. PT provides encouragement to pt and reassures pt she does not have to do anything she is not comfortable with.  LAQ 2x15. R LE medium LLE rates hard Seated Crunches 15x with holding 4# dumbbell 15x Oblique twists holding YTB for resistance 10x each side   Reviewed and provided HEP (see below)  Encouragement provided throughout session as pt expresses concerns with performing certain exercises or tests. Reviewed purpose of starting with seated therex to build toward standing, purpose of interventions chosen for HEP.    PATIENT EDUCATION: Education details: Pt educated throughout session about proper posture and technique with exercises. Improved exercise technique, movement at target joints, use of target muscles after min to mod verbal, visual, tactile cues. Further testing, indications  Person educated: Patient Education method: Explanation Education comprehension: verbalized understanding  HOME EXERCISE PROGRAM: Updated 05/29/2022: Access Code: [email or web address] URL: [email or web address] Date: 05/29/2022 Prepared by: Ricard Dillon  Exercises - Seated March  - 1 x daily - 5 x weekly - 3 sets - 10 reps - Seated Long Arc Quad  - 1 x daily - 7 x weekly - 3 sets - 10 reps -written on handout: seated core twists with holding YTB or RTB 5x weekly 3x10 each side  Previous: Pt performs leg lifts and squats with UE support  GOALS: Goals reviewed with patient? Yes  SHORT TERM GOALS: Target date:  06/24/2022       Patient will be independent in home exercise program to improve strength/mobility for better functional independence with ADLs. Baseline: No HEP currently  Goal status: INITIAL   LONG TERM GOALS: Target date: 08/19/2022   1.  Patient (> 58 years old) will complete five times sit to stand test in < 15 seconds indicating an increased LE strength and improved balance. Baseline:  [place] second visit ; 05/29/2022: 40 sec pull to stance RUE Goal status: INITIAL  2.  Patient will increase FOTO score by 8 or more points  to demonstrate statistically significant improvement in mobility and quality of life.  Baseline: Test second visit; 05/28/2040: 45 Goal status: INITIAL     3.  Patient will increase 10 meter walk test to >.66m/s as to improve gait speed for better community ambulation and to reduce fall risk. Baseline: Test visit 2  Goal status: INITIAL  4.   Patient will improve ability to ambulate on uneven surfaces as evidenced by ability to ambulate over simulated uneven terrain in clinic prior to improve independence with ambulation. Baseline: Patient ambulating on uneven terrain Goal status: INITIAL   ASSESSMENT:  CLINICAL IMPRESSION: Initiated testing on this date. Current FOTO score is 45 with goal score of 53, indicating decreased perception of functional mobility and QOL. Pt 5xSTS today was 40 sec with pt utilizing RUE for pull to stand. Pt score likely affected as pt expressed anxiety about test. Initiated HEP to address LE deficits. Starting with seated interventions at this time. 10MWT to be performed future visit. During session pt reported concern about performing 10MWT and is worried about the floor tile possibly causing her to slip. PT provided encouragement and reassurance to pt. PT also sending form to physician for referral to orthotist as pt reporting problems with her current LLE brace. Patient will benefit from skilled physical therapy services in order to  improve her lower extremity strength, improve her ability to ambulate with greater independence and greater variety of settings, in order to improve her overall quality of life and prevent falls.  OBJECTIVE IMPAIRMENTS: Abnormal gait, decreased activity tolerance, decreased balance, decreased endurance, decreased strength, decreased safety awareness, hypomobility, impaired flexibility, and impaired UE functional use.   ACTIVITY LIMITATIONS: lifting, bending, standing, stairs, transfers, bed mobility, and locomotion level  PARTICIPATION LIMITATIONS: meal prep, cleaning, shopping, and community activity  PERSONAL FACTORS: Time since onset of injury/illness/exacerbation and 3+ comorbidities: diabetes, HTN, Multiple previous strokes   are also affecting patient's functional outcome.   REHAB POTENTIAL: Fair secondary to time since onset and severely of visual deficits and L UE funcitonal use   CLINICAL DECISION MAKING: Stable/uncomplicated  EVALUATION COMPLEXITY: Low  PLAN:  PT FREQUENCY: 2x/week  PT DURATION: 12 weeks  PLANNED INTERVENTIONS: Therapeutic exercises, Therapeutic activity, Neuromuscular re-education, Balance training, Gait training, Patient/Family education, Self Care, Joint mobilization, Stair training, Moist heat, Manual therapy, and Re-evaluation  PLAN FOR NEXT SESSION: 10 MWT with close CGA for pt safety and comfort and strength and balance training initiation    Zollie Pee, PT 05/29/2022, 4:08 PM

## 2022-05-30 ENCOUNTER — Ambulatory Visit: Payer: PPO | Admitting: Podiatry

## 2022-05-30 DIAGNOSIS — M79674 Pain in right toe(s): Secondary | ICD-10-CM

## 2022-05-30 DIAGNOSIS — M79675 Pain in left toe(s): Secondary | ICD-10-CM

## 2022-05-30 DIAGNOSIS — B351 Tinea unguium: Secondary | ICD-10-CM

## 2022-06-01 ENCOUNTER — Encounter: Payer: Self-pay | Admitting: Podiatry

## 2022-06-02 ENCOUNTER — Ambulatory Visit: Payer: PPO | Admitting: Physical Therapy

## 2022-06-02 ENCOUNTER — Ambulatory Visit: Payer: PPO

## 2022-06-02 DIAGNOSIS — E1142 Type 2 diabetes mellitus with diabetic polyneuropathy: Secondary | ICD-10-CM | POA: Diagnosis not present

## 2022-06-03 ENCOUNTER — Other Ambulatory Visit: Payer: Self-pay

## 2022-06-03 ENCOUNTER — Encounter: Payer: Self-pay | Admitting: Gastroenterology

## 2022-06-03 ENCOUNTER — Other Ambulatory Visit: Payer: Self-pay | Admitting: Gastroenterology

## 2022-06-03 MED ORDER — LINACLOTIDE 72 MCG PO CAPS
72.0000 ug | ORAL_CAPSULE | Freq: Every day | ORAL | 5 refills | Status: DC
  Filled 2022-06-03: qty 30, 30d supply, fill #0

## 2022-06-04 ENCOUNTER — Other Ambulatory Visit: Payer: Self-pay

## 2022-06-04 ENCOUNTER — Ambulatory Visit: Payer: PPO | Admitting: Physical Therapy

## 2022-06-04 MED ORDER — ROSUVASTATIN CALCIUM 20 MG PO TABS
20.0000 mg | ORAL_TABLET | Freq: Every day | ORAL | 5 refills | Status: DC
  Filled 2022-06-04: qty 30, 30d supply, fill #0
  Filled 2022-07-06: qty 30, 30d supply, fill #1
  Filled 2022-08-04: qty 30, 30d supply, fill #2
  Filled 2022-08-06: qty 33, 33d supply, fill #2
  Filled 2022-08-07: qty 30, 30d supply, fill #2
  Filled 2022-09-02: qty 30, 30d supply, fill #3
  Filled 2022-10-03: qty 30, 30d supply, fill #4
  Filled 2022-11-03: qty 30, 30d supply, fill #5

## 2022-06-05 ENCOUNTER — Other Ambulatory Visit: Payer: Self-pay

## 2022-06-05 ENCOUNTER — Ambulatory Visit: Payer: PPO | Admitting: Physical Therapy

## 2022-06-06 ENCOUNTER — Other Ambulatory Visit: Payer: Self-pay

## 2022-06-09 ENCOUNTER — Ambulatory Visit: Payer: PPO | Admitting: Physical Therapy

## 2022-06-09 NOTE — Progress Notes (Signed)
   Chief Complaint  Patient presents with   Nail Problem    Routine foot care, nail trim     SUBJECTIVE Patient with a history of diabetes mellitus minimally ambulatory presenting today in a wheelchair presents to office today complaining of elongated, thickened nails that cause pain while ambulating in shoes.  Patient is unable to trim their own nails. Patient is here for further evaluation and treatment.   Past Medical History:  Diagnosis Date   Abdominal pain 12/06/2013   Abnormality of gait 01/26/2013   Altered bowel habits 12/11/2017   Bruises easily    Constipation    Crohn's disease    Depression    Dizziness and giddiness 01/26/2013   Fecal incontinence alternating with constipation 12/11/2017   GERD (gastroesophageal reflux disease)    Hemianopia    left   Hemiparesis and alteration of sensations as late effects of stroke 01/26/2013   Hyperlipidemia    Hypertension    Hypothyroidism    Lump in female breast    Panic disorder    PONV (postoperative nausea and vomiting)    Stroke 2009   Tremor, essential 04/24/2020   Type II diabetes mellitus    Past Surgical History:  Procedure Laterality Date   ANAL FISSURE REPAIR  2008   APPENDECTOMY  1986   BREAST BIOPSY Left 2006   neg   BREAST LUMPECTOMY Right 1990's   CESAREAN SECTION  1981; 1983   CHOLECYSTECTOMY  1986   SMALL INTESTINE SURGERY  05/1991   TUBAL LIGATION  1983   Allergies  Allergen Reactions   Codeine Other (See Comments)    Syncope.   Morphine And Related Nausea And Vomiting and Other (See Comments)    Hallucinations.   Baclofen Nausea Only and Other (See Comments)    dizziness   Metformin Hcl Other (See Comments)   Penicillins Other (See Comments)    Family history   Sulfamethoxazole Itching    Hands itching    Elemental Sulfur Itching    Per patient happened years ago.     OBJECTIVE General Patient is awake, alert, and oriented x 3 and in no acute distress. Derm Skin is dry and supple  bilateral. Negative open lesions or macerations. Remaining integument unremarkable. Nails are tender, long, thickened and dystrophic with subungual debris, consistent with onychomycosis, 1-5 bilateral. No signs of infection noted. Vasc  DP and PT pedal pulses palpable bilaterally. Temperature gradient within normal limits.  Neuro Epicritic and protective threshold sensation diminished bilaterally.  Musculoskeletal Exam No symptomatic pedal deformities noted bilateral. Muscular strength within normal limits.  ASSESSMENT 1. Diabetes Mellitus w/ peripheral neuropathy 2.  Pain due to onychomycosis of toenails bilateral  PLAN OF CARE 1. Patient evaluated today. 2. Instructed to maintain good pedal hygiene and foot care. Stressed importance of controlling blood sugar.  3. Mechanical debridement of nails 1-5 bilaterally performed using a nail nipper. Filed with dremel without incident.  4. Return to clinic in 3 mos.     Felecia Shelling, DPM [facility]  Dr. Felecia Shelling, DPM    [address] [place], [place] [address]                Office [phone]  Fax [phone]

## 2022-06-10 ENCOUNTER — Ambulatory Visit: Payer: PPO | Admitting: Physical Therapy

## 2022-06-11 ENCOUNTER — Ambulatory Visit: Payer: PPO | Admitting: Physical Therapy

## 2022-06-13 ENCOUNTER — Other Ambulatory Visit: Payer: Self-pay

## 2022-06-13 MED ORDER — RAMIPRIL 2.5 MG PO CAPS
ORAL_CAPSULE | ORAL | 1 refills | Status: DC
  Filled 2022-06-13: qty 90, 90d supply, fill #0
  Filled 2022-07-06 – 2022-09-08 (×3): qty 90, 90d supply, fill #1

## 2022-06-16 ENCOUNTER — Ambulatory Visit: Payer: PPO

## 2022-06-17 ENCOUNTER — Ambulatory Visit: Payer: PPO

## 2022-06-17 ENCOUNTER — Ambulatory Visit: Payer: PPO | Admitting: Physical Therapy

## 2022-06-17 DIAGNOSIS — R269 Unspecified abnormalities of gait and mobility: Secondary | ICD-10-CM | POA: Diagnosis not present

## 2022-06-17 DIAGNOSIS — R278 Other lack of coordination: Secondary | ICD-10-CM

## 2022-06-17 DIAGNOSIS — M6281 Muscle weakness (generalized): Secondary | ICD-10-CM

## 2022-06-17 DIAGNOSIS — R262 Difficulty in walking, not elsewhere classified: Secondary | ICD-10-CM

## 2022-06-17 DIAGNOSIS — R2689 Other abnormalities of gait and mobility: Secondary | ICD-10-CM

## 2022-06-17 DIAGNOSIS — R2681 Unsteadiness on feet: Secondary | ICD-10-CM

## 2022-06-17 NOTE — Therapy (Signed)
OUTPATIENT PHYSICAL THERAPY NEURO TREATMENT NOTE   Patient Name: [name] MRN: [id number] DOB:11-16-1950, 72 y.o., female Today's Date: 06/17/2022   PCP:   Enid Baas, MD   REFERRING PROVIDER: Morene Crocker, MD   END OF SESSION:  PT End of Session - 06/17/22 1306     Visit Number 3    Number of Visits 24    Date for PT Re-Evaluation 08/19/22    Progress Note Due on Visit 10    PT Start Time 1302    PT Stop Time 1345    PT Time Calculation (min) 43 min    Equipment Utilized During Treatment Gait belt    Activity Tolerance Patient tolerated treatment well    Behavior During Therapy WFL for tasks assessed/performed              Past Medical History:  Diagnosis Date   Abdominal pain 12/06/2013   Abnormality of gait 01/26/2013   Altered bowel habits 12/11/2017   Bruises easily    Constipation    Crohn's disease    Depression    Dizziness and giddiness 01/26/2013   Fecal incontinence alternating with constipation 12/11/2017   GERD (gastroesophageal reflux disease)    Hemianopia    left   Hemiparesis and alteration of sensations as late effects of stroke 01/26/2013   Hyperlipidemia    Hypertension    Hypothyroidism    Lump in female breast    Panic disorder    PONV (postoperative nausea and vomiting)    Stroke 2009   Tremor, essential 04/24/2020   Type II diabetes mellitus    Past Surgical History:  Procedure Laterality Date   ANAL FISSURE REPAIR  2008   APPENDECTOMY  1986   BREAST BIOPSY Left 2006   neg   BREAST LUMPECTOMY Right 1990's   CESAREAN SECTION  1981; 1983   CHOLECYSTECTOMY  1986   SMALL INTESTINE SURGERY  05/1991   TUBAL LIGATION  1983   Patient Active Problem List   Diagnosis Date Noted   Bone spur 06/13/2021   Acute CVA (cerebrovascular accident) 03/07/2021   CVA (cerebral vascular accident) 11/01/2020   Chronic pain 04/25/2020   Chronic constipation 04/25/2020   Gastro-esophageal reflux disease without esophagitis  04/25/2020   Hardening of the aorta (main artery of the heart) 04/25/2020   History of iron deficiency anemia 04/25/2020   Hypercholesteremia 04/25/2020   Hypertension 04/25/2020   Personal history of transient ischemic attack (TIA), and cerebral infarction without residual deficits 04/25/2020   Pulmonary emphysema 04/25/2020   Vitamin D deficiency 04/25/2020   Tremor, essential 04/24/2020   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   Right lower quadrant abdominal pain 08/18/2018   Spastic hemiplegia affecting nondominant side (HCC) 02/03/2014   Diabetes mellitus (HCC) 09/07/2013   Hypothyroidism 09/07/2013   Type 2 diabetes mellitus without complications 09/07/2013   Hemiparesis and alteration of sensations as late effects of stroke (HCC) 01/26/2013   Unsteady gait 01/26/2013   Dizziness and giddiness 01/26/2013    ONSET DATE: 2009  REFERRING DIAG: Z86.73 (ICD-10-CM) - History of stroke   THERAPY DIAG:  Muscle weakness (generalized)  Other lack of coordination  Difficulty in walking, not elsewhere classified  Unsteadiness on feet  Abnormality of gait and mobility  Other abnormalities of gait and mobility  Rationale for Evaluation and Treatment: Rehabilitation  SUBJECTIVE:  SUBJECTIVE STATEMENT: Patient reports she was seen by [facility] and brace adjusted but going back in a week to reassess. States no questions about her current HEP.    Pt accompanied by: self  PERTINENT HISTORY: Pt has had one major stroke and 5 mini strokes since 2009. Pt is here since her doctor wants her to work on her strength and balancea nd she also wants to improve in these categories. Pt has hemianopsia with blindness inferior, left side and superior. Pt initially had stroke in 2009 and was previously able to  walk for long distances. Pt has 650 feet driveway that is gravel and used to walk up and down with the cane. Pt has issues with depth perception. Pt is unable to tell angles of concrete and holes in the ground. Pt has service dogs that are retired, one for guidance and one for helping with picking up and retrieving items. Pt had most recent stroke in January 2023 that further limited her field of vision.  Patient wants to work diligently on her strength, balance, mobility, and in order to improve her ability to ambulate on various surfaces in various scenarios.  Patient wanted to come to outpatient services to utilize equipment to improve her strength and a greater capacity than home health was able to assist her with.  She is doing home exercise program involving mostly standing with upper extremity support and activities to improve her strength.  Patient reports she is very nervous to ambulate without contact-guard assist and gait belt without being able to hold onto a wall or steady surface in order to provide her with tactile cues for the areas around her.  PAIN:  Are you having pain? Yes: NPRS scale: 0/10 Pain location: left toe Pain description: pressure from toe on the left pushing up  Aggravating factors: no telling  Relieving factors: no telling   PRECAUTIONS: Fall  WEIGHT BEARING RESTRICTIONS: No  FALLS: Has patient fallen in last 6 months? No  LIVING ENVIRONMENT: Lives with: lives with their family and lives with their spouse Lives in: House/apartment Stairs: No Has following equipment at home: Single point cane, Environmental consultant - 4 wheeled, Wheelchair (manual), Grab bars, and Ramped entry and power wheelchair  PLOF: Independent with household mobility with device, Independent with community mobility with device, Independent with homemaking with ambulation, and    PATIENT GOALS: Improve gait, improve strength, walk on unsteady surfaces  OBJECTIVE:   DIAGNOSTIC FINDINGS: MRI Brain without  contrast  03/07/2021:  1. Small acute ischemic nonhemorrhagic infarcts involving the left thalamus/midbrain and left subinsular white matter. 2. Large remote right PCA distribution infarct. 3. Underlying age-related cerebral atrophy with moderate chronic microvascular ischemic disease with several additional remote lacunar infarcts involving the bilateral basal ganglia and right thalamus.   COGNITION: Overall cognitive status: Within functional limits for tasks assessed   SENSATION: Not tested  COORDINATION: Not tested      POSTURE:  slumped posture seated in wheelchair    LOWER EXTREMITY MMT:    MMT Right Eval Left Eval  Hip flexion 4+ 3+  Hip extension    Hip abduction 4+ 4  Hip adduction 4+ 4  Hip internal rotation    Hip external rotation    Knee flexion 4+ 3+  Knee extension 4+ 3+  Ankle dorsiflexion    Ankle plantarflexion    Ankle inversion    Ankle eversion    (Blank rows = not tested)  BED MOBILITY:  Rolling to Right Modified independence  TRANSFERS: uses  UE assist but no device.  Assistive device utilized:  UE assist    Sit to stand:  UE assist    RAMP:  Level of Assistance: Total A Assistive device utilized: Wheelchair (manual) Ramp Comments:       GAIT: Gait pattern:  No assessed visit 1  Distance walked:  Assistive device utilized:  small based cane with forearm support Level of assistance:  assess visit 2    FUNCTIONAL TESTS:  5 times sit to stand: assess visit 2  10 meter walk test: assess visit 2   PATIENT SURVEYS:  FOTO assess visit 2   TODAY'S TREATMENT:                                                                                                                              DATE: 06/17/22  Therex:  -Seated hip march x 10 reps each LE -Seated knee ext x 10 reps each LE- VC to try to extend and perform slow and controlled.  -Seated ham curl with RTB- Left LE x 10 reps - Patient reported as hard - able to complete with  partial ROM.  -Sit to stand with Right UE -Seated hip flex/abd up and over 1/2 foam x 10 reps each LE.   Standing hip abd x 10 reps alt LE  Standing ham curl - attempted to show PT what she is trying at home- Minimal ability to lift lower leg off floor Forward propulsion in manual w/c using just Left LE to kick leg forward and pull using her hamstrings- x 10 feet x 2.   Gait in clinic - using cane- CGA- 50 feet total- exhibiting short reciprocal steps (left AFO) with mild supination- yet no LOB today. No reported pain        .    PATIENT EDUCATION: Education details: Pt educated throughout session about proper posture and technique with exercises. Improved exercise technique, movement at target joints, use of target muscles after min to mod verbal, visual, tactile cues. Further testing, indications  Person educated: Patient Education method: Explanation Education comprehension: verbalized understanding  HOME EXERCISE PROGRAM: No updates- did recommend performing HEP 3x/week   Updated 05/29/2022: Access Code: [email or web address] URL: [email or web address] Date: 05/29/2022 Prepared by: Temple Pacini  Exercises - Seated March  - 1 x daily - 5 x weekly - 3 sets - 10 reps - Seated Long Arc Quad  - 1 x daily - 7 x weekly - 3 sets - 10 reps -written on handout: seated core twists with holding YTB or RTB 5x weekly 3x10 each side  Previous: Pt performs leg lifts and squats with UE support    GOALS: Goals reviewed with patient? Yes  SHORT TERM GOALS: Target date: 06/24/2022       Patient will be independent in home exercise program to improve strength/mobility for better functional independence with ADLs. Baseline: No HEP currently  Goal status: INITIAL   LONG TERM GOALS: Target date: 08/19/2022  1.  Patient (> 52 years old) will complete five times sit to stand test in < 15 seconds indicating an increased LE strength and improved balance. Baseline:  Testy second  visit ; 05/29/2022: 40 sec pull to stance RUE Goal status: INITIAL  2.  Patient will increase FOTO score by 8 or more points  to demonstrate statistically significant improvement in mobility and quality of life.  Baseline: Test second visit; 05/28/2040: 45 Goal status: INITIAL     3.  Patient will increase 10 meter walk test to >.20m/s as to improve gait speed for better community ambulation and to reduce fall risk. Baseline: Test visit 2  Goal status: INITIAL  4.   Patient will improve ability to ambulate on uneven surfaces as evidenced by ability to ambulate over simulated uneven terrain in clinic prior to improve independence with ambulation. Baseline: Patient ambulating on uneven terrain Goal status: INITIAL   ASSESSMENT:  CLINICAL IMPRESSION: Patient returns back to PT today- states she did have some modifications performed on  her AFO to assist with fit and reduce amount of supination. She was able to walk with use of cane today with some supination yet effect for short distance performed today. She responded well to explanation of "why" when performing specific exercises. She fatigues quickly with left LE and this will remain an area of focus early on along with safe mobility. Patient will benefit from skilled physical therapy services in order to improve her lower extremity strength, improve her ability to ambulate with greater independence and greater variety of settings, in order to improve her overall quality of life and prevent falls.  OBJECTIVE IMPAIRMENTS: Abnormal gait, decreased activity tolerance, decreased balance, decreased endurance, decreased strength, decreased safety awareness, hypomobility, impaired flexibility, and impaired UE functional use.   ACTIVITY LIMITATIONS: lifting, bending, standing, stairs, transfers, bed mobility, and locomotion level  PARTICIPATION LIMITATIONS: meal prep, cleaning, shopping, and community activity  PERSONAL FACTORS: Time since onset of  injury/illness/exacerbation and 3+ comorbidities: diabetes, HTN, Multiple previous strokes   are also affecting patient's functional outcome.   REHAB POTENTIAL: Fair secondary to time since onset and severely of visual deficits and L UE funcitonal use   CLINICAL DECISION MAKING: Stable/uncomplicated  EVALUATION COMPLEXITY: Low  PLAN:  PT FREQUENCY: 2x/week  PT DURATION: 12 weeks  PLANNED INTERVENTIONS: Therapeutic exercises, Therapeutic activity, Neuromuscular re-education, Balance training, Gait training, Patient/Family education, Self Care, Joint mobilization, Stair training, Moist heat, Manual therapy, and Re-evaluation  PLAN FOR NEXT SESSION: Continue with progressive LE strengthening next visit.    Lenda Kelp, PT 06/17/2022, 2:21 PM

## 2022-06-19 ENCOUNTER — Encounter: Payer: Self-pay | Admitting: Physical Therapy

## 2022-06-19 ENCOUNTER — Ambulatory Visit: Payer: PPO | Admitting: Physical Therapy

## 2022-06-19 DIAGNOSIS — R2681 Unsteadiness on feet: Secondary | ICD-10-CM

## 2022-06-19 DIAGNOSIS — R262 Difficulty in walking, not elsewhere classified: Secondary | ICD-10-CM

## 2022-06-19 DIAGNOSIS — R269 Unspecified abnormalities of gait and mobility: Secondary | ICD-10-CM | POA: Diagnosis not present

## 2022-06-19 DIAGNOSIS — M6281 Muscle weakness (generalized): Secondary | ICD-10-CM

## 2022-06-19 DIAGNOSIS — R2689 Other abnormalities of gait and mobility: Secondary | ICD-10-CM

## 2022-06-19 NOTE — Therapy (Signed)
OUTPATIENT PHYSICAL THERAPY NEURO TREATMENT NOTE   Patient Name: [name] MRN: [id number] DOB:[birth year], 72 y.o., female Today's Date: 06/19/2022   PCP:   Enid Baas, MD   REFERRING PROVIDER: Morene Crocker, MD   END OF SESSION:  PT End of Session - 06/19/22 1433     Visit Number 4    Number of Visits 24    Date for PT Re-Evaluation 08/19/22    Progress Note Due on Visit 10    PT Start Time 1433    PT Stop Time 1513    PT Time Calculation (min) 40 min    Equipment Utilized During Treatment Gait belt    Activity Tolerance Patient tolerated treatment well    Behavior During Therapy [facility] for tasks assessed/performed              Past Medical History:  Diagnosis Date   Abdominal pain 12/06/2013   Abnormality of gait 01/26/2013   Altered bowel habits 12/11/2017   Bruises easily    Constipation    Crohn's disease    Depression    Dizziness and giddiness 01/26/2013   Fecal incontinence alternating with constipation 12/11/2017   GERD (gastroesophageal reflux disease)    Hemianopia    left   Hemiparesis and alteration of sensations as late effects of stroke 01/26/2013   Hyperlipidemia    Hypertension    Hypothyroidism    Lump in female breast    Panic disorder    PONV (postoperative nausea and vomiting)    Stroke 2009   Tremor, essential 04/24/2020   Type II diabetes mellitus    Past Surgical History:  Procedure Laterality Date   ANAL FISSURE REPAIR  2008   APPENDECTOMY  1986   BREAST BIOPSY Left 2006   neg   BREAST LUMPECTOMY Right 1990's   CESAREAN SECTION  1981; 1983   CHOLECYSTECTOMY  1986   SMALL INTESTINE SURGERY  05/1991   TUBAL LIGATION  1983   Patient Active Problem List   Diagnosis Date Noted   Bone spur 06/13/2021   Acute CVA (cerebrovascular accident) 03/07/2021   CVA (cerebral vascular accident) 11/01/2020   Chronic pain 04/25/2020   Chronic constipation 04/25/2020   Gastro-esophageal reflux disease without esophagitis  04/25/2020   Hardening of the aorta (main artery of the heart) 04/25/2020   History of iron deficiency anemia 04/25/2020   Hypercholesteremia 04/25/2020   Hypertension 04/25/2020   Personal history of transient ischemic attack (TIA), and cerebral infarction without residual deficits 04/25/2020   Pulmonary emphysema 04/25/2020   Vitamin D deficiency 04/25/2020   Tremor, essential 04/24/2020   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   Right lower quadrant abdominal pain 08/18/2018   Spastic hemiplegia affecting nondominant side (HCC) 02/03/2014   Diabetes mellitus (HCC) 09/07/2013   Hypothyroidism 09/07/2013   Type 2 diabetes mellitus without complications 09/07/2013   Hemiparesis and alteration of sensations as late effects of stroke (HCC) 01/26/2013   Unsteady gait 01/26/2013   Dizziness and giddiness 01/26/2013    ONSET DATE: 2009  REFERRING DIAG: Z86.73 (ICD-10-CM) - History of stroke   THERAPY DIAG:  Muscle weakness (generalized)  Difficulty in walking, not elsewhere classified  Unsteadiness on feet  Abnormality of gait and mobility  Other abnormalities of gait and mobility  Rationale for Evaluation and Treatment: Rehabilitation  SUBJECTIVE:  SUBJECTIVE STATEMENT: Patient has some questions regarding HEP. Questions were answered and pt provided with RTB for home use with hamstring curls in seated position. Pt also instructed to use ankle weights on her RLE for now and if exercises become easy on the left she could use light ankle weights on the L LE.     Pt accompanied by: self  PERTINENT HISTORY: Pt has had one major stroke and 5 mini strokes since 2009. Pt is here since her doctor wants her to work on her strength and balancea nd she also wants to improve in these categories. Pt  has hemianopsia with blindness inferior, left side and superior. Pt initially had stroke in 2009 and was previously able to walk for long distances. Pt has 650 feet driveway that is gravel and used to walk up and down with the cane. Pt has issues with depth perception. Pt is unable to tell angles of concrete and holes in the ground. Pt has service dogs that are retired, one for guidance and one for helping with picking up and retrieving items. Pt had most recent stroke in January 2023 that further limited her field of vision.  Patient wants to work diligently on her strength, balance, mobility, and in order to improve her ability to ambulate on various surfaces in various scenarios.  Patient wanted to come to outpatient services to utilize equipment to improve her strength and a greater capacity than home health was able to assist her with.  She is doing home exercise program involving mostly standing with upper extremity support and activities to improve her strength.  Patient reports she is very nervous to ambulate without contact-guard assist and gait belt without being able to hold onto a wall or steady surface in order to provide her with tactile cues for the areas around her.  PAIN:  Are you having pain? Yes: NPRS scale: 0/10 Pain location: left toe Pain description: pressure from toe on the left pushing up  Aggravating factors: no telling  Relieving factors: no telling   PRECAUTIONS: Fall  WEIGHT BEARING RESTRICTIONS: No  FALLS: Has patient fallen in last 6 months? No  LIVING ENVIRONMENT: Lives with: lives with their family and lives with their spouse Lives in: House/apartment Stairs: No Has following equipment at home: Single point cane, Environmental consultant - 4 wheeled, Wheelchair (manual), Grab bars, and Ramped entry and power wheelchair  PLOF: Independent with household mobility with device, Independent with community mobility with device, Independent with homemaking with ambulation, and     PATIENT GOALS: Improve gait, improve strength, walk on unsteady surfaces  OBJECTIVE:   DIAGNOSTIC FINDINGS: MRI Brain without contrast  03/07/2021:  1. Small acute ischemic nonhemorrhagic infarcts involving the left thalamus/midbrain and left subinsular white matter. 2. Large remote right PCA distribution infarct. 3. Underlying age-related cerebral atrophy with moderate chronic microvascular ischemic disease with several additional remote lacunar infarcts involving the bilateral basal ganglia and right thalamus.   COGNITION: Overall cognitive status: Within functional limits for tasks assessed   SENSATION: Not tested  COORDINATION: Not tested      POSTURE:  slumped posture seated in wheelchair    LOWER EXTREMITY MMT:    MMT Right Eval Left Eval  Hip flexion 4+ 3+  Hip extension    Hip abduction 4+ 4  Hip adduction 4+ 4  Hip internal rotation    Hip external rotation    Knee flexion 4+ 3+  Knee extension 4+ 3+  Ankle dorsiflexion    Ankle plantarflexion  Ankle inversion    Ankle eversion    (Blank rows = not tested)  BED MOBILITY:  Rolling to Right Modified independence  TRANSFERS: uses UE assist but no device.  Assistive device utilized:  UE assist    Sit to stand:  UE assist    RAMP:  Level of Assistance: Total A Assistive device utilized: Wheelchair (manual) Ramp Comments:       GAIT: Gait pattern:  No assessed visit 1  Distance walked:  Assistive device utilized:  small based cane with forearm support Level of assistance:  assess visit 2    FUNCTIONAL TESTS:  5 times sit to stand: assess visit 2  10 meter walk test: assess visit 2   PATIENT SURVEYS:  FOTO assess visit 2   TODAY'S TREATMENT:                                                                                                                              DATE: 06/19/22    Therex:  -Seated hip march x 10 reps each LE -Seated knee ext x 10 reps each LE- VC to try to  extend and perform slow and controlled.  -Seated ham curl with RTB- Left LE x 10 reps -  -Seated hip flex/abd up and over 1/2 foam x 10 reps each LE.   TA:  : 53 sec with personal SPC, cues for not veering to the right when ambulating.  -Ambulating back and pt tasked with looking at object in distance and list to the right was less profound but still present   The following activities were completed in parallel bars or at balance bar  : ambulation in bars x 4 laps, improved reciprocal gait, slight supination on L noticed to be worsening throughout bout  -LLE on 3 degree decline toward COM x 10 mini squats, good foot contact throughout without supination  - LLE on 3 degree incline 2 x 10 step through gait, good foot contact throughout.    Unless otherwise stated, CGA was provided and gait belt donned in order to ensure pt safety     .    PATIENT EDUCATION: Education details: Pt educated throughout session about proper posture and technique with exercises. Improved exercise technique, movement at target joints, use of target muscles after min to mod verbal, visual, tactile cues. Further testing, indications  Person educated: Patient Education method: Explanation Education comprehension: verbalized understanding  HOME EXERCISE PROGRAM: No updates- did recommend performing HEP 3x/week   Updated 05/29/2022: Access Code: [email or web address] URL: [email or web address] Date: 05/29/2022 Prepared by: Temple Pacini  Exercises - Seated March  - 1 x daily - 5 x weekly - 3 sets - 10 reps - Seated Long Arc Quad  - 1 x daily - 7 x weekly - 3 sets - 10 reps -written on handout: seated core twists with holding YTB or RTB 5x weekly 3x10 each side  Previous: Pt performs leg lifts and squats with UE support  GOALS: Goals reviewed with patient? Yes  SHORT TERM GOALS: Target date: 06/24/2022       Patient will be independent in home exercise program to improve  strength/mobility for better functional independence with ADLs. Baseline: No HEP currently  Goal status: INITIAL   LONG TERM GOALS: Target date: 08/19/2022   1.  Patient (> 80 years old) will complete five times sit to stand test in < 15 seconds indicating an increased LE strength and improved balance. Baseline:  Testy second visit ; 05/29/2022: 40 sec pull to stance RUE Goal status: INITIAL  2.  Patient will increase FOTO score by 8 or more points  to demonstrate statistically significant improvement in mobility and quality of life.  Baseline: Test second visit; 05/28/2040: 45 Goal status: INITIAL     3.  Patient will increase 10 meter walk test to >.6m/s as to improve gait speed for better community ambulation and to reduce fall risk. Baseline: Test visit 2 06/19/22: 53 sec  Goal status: INITIAL  4.   Patient will improve ability to ambulate on uneven surfaces as evidenced by ability to ambulate over simulated uneven terrain in clinic prior to improve independence with ambulation. Baseline: Patient ambulating on uneven terrain Goal status: INITIAL   ASSESSMENT:  CLINICAL IMPRESSION: Pt presents with good motivation for completion of PT activities.  Patient assessed with 10 m walk test and patient completes and 51 seconds.  Patient's time is increased secondary to needing correction for straight line of ambulation.  Patient did show some improvement in her ability to ambulate in a straight line when focus was on external object forehead compared to looking at closer objects.  Patient still having supination in her left lower extremity during gait and we worked on this during // bar activities with good results. Pt will continue to benefit from skilled physical therapy intervention to address impairments, improve QOL, and attain therapy goals.    OBJECTIVE IMPAIRMENTS: Abnormal gait, decreased activity tolerance, decreased balance, decreased endurance, decreased strength, decreased safety  awareness, hypomobility, impaired flexibility, and impaired UE functional use.   ACTIVITY LIMITATIONS: lifting, bending, standing, stairs, transfers, bed mobility, and locomotion level  PARTICIPATION LIMITATIONS: meal prep, cleaning, shopping, and community activity  PERSONAL FACTORS: Time since onset of injury/illness/exacerbation and 3+ comorbidities: diabetes, HTN, Multiple previous strokes   are also affecting patient's functional outcome.   REHAB POTENTIAL: Fair secondary to time since onset and severely of visual deficits and L UE funcitonal use   CLINICAL DECISION MAKING: Stable/uncomplicated  EVALUATION COMPLEXITY: Low  PLAN:  PT FREQUENCY: 2x/week  PT DURATION: 12 weeks  PLANNED INTERVENTIONS: Therapeutic exercises, Therapeutic activity, Neuromuscular re-education, Balance training, Gait training, Patient/Family education, Self Care, Joint mobilization, Stair training, Moist heat, Manual therapy, and Re-evaluation  PLAN FOR NEXT SESSION: Continue with progressive LE strengthening next visit.    Norman Herrlich, PT 06/19/2022, 3:54 PM

## 2022-06-20 ENCOUNTER — Ambulatory Visit: Payer: PPO

## 2022-06-23 ENCOUNTER — Ambulatory Visit: Payer: PPO | Admitting: Physical Therapy

## 2022-06-23 ENCOUNTER — Other Ambulatory Visit: Payer: Self-pay

## 2022-06-23 DIAGNOSIS — R2681 Unsteadiness on feet: Secondary | ICD-10-CM

## 2022-06-23 DIAGNOSIS — R269 Unspecified abnormalities of gait and mobility: Secondary | ICD-10-CM

## 2022-06-23 DIAGNOSIS — R262 Difficulty in walking, not elsewhere classified: Secondary | ICD-10-CM

## 2022-06-23 DIAGNOSIS — M6281 Muscle weakness (generalized): Secondary | ICD-10-CM

## 2022-06-23 DIAGNOSIS — R2689 Other abnormalities of gait and mobility: Secondary | ICD-10-CM

## 2022-06-23 MED ORDER — LINACLOTIDE 145 MCG PO CAPS
145.0000 ug | ORAL_CAPSULE | Freq: Every day | ORAL | 1 refills | Status: DC
  Filled 2022-06-23 – 2022-06-25 (×3): qty 30, 30d supply, fill #0
  Filled 2022-07-26: qty 30, 30d supply, fill #1

## 2022-06-23 MED ORDER — LINACLOTIDE 145 MCG PO CAPS: 145.0000 ug | ORAL_CAPSULE | Freq: Every day | ORAL | 1 refills | Status: DC

## 2022-06-23 NOTE — Therapy (Signed)
OUTPATIENT PHYSICAL THERAPY NEURO TREATMENT NOTE   Patient Name: [name] MRN: [id number] DOB:[birth year], 72 y.o., female Today's Date: 06/23/2022   PCP:   Enid Baas, MD   REFERRING PROVIDER: Morene Crocker, MD   END OF SESSION:  PT End of Session - 06/23/22 1610     Visit Number 5    Number of Visits 24    Date for PT Re-Evaluation 08/19/22    Progress Note Due on Visit 10    PT Start Time 1347    PT Stop Time 1430    PT Time Calculation (min) 43 min    Equipment Utilized During Treatment Gait belt    Activity Tolerance Patient tolerated treatment well    Behavior During Therapy WFL for tasks assessed/performed               Past Medical History:  Diagnosis Date   Abdominal pain 12/06/2013   Abnormality of gait 01/26/2013   Altered bowel habits 12/11/2017   Bruises easily    Constipation    Crohn's disease (HCC)    Depression    Dizziness and giddiness 01/26/2013   Fecal incontinence alternating with constipation 12/11/2017   GERD (gastroesophageal reflux disease)    Hemianopia    left   Hemiparesis and alteration of sensations as late effects of stroke (HCC) 01/26/2013   Hyperlipidemia    Hypertension    Hypothyroidism    Lump in female breast    Panic disorder    PONV (postoperative nausea and vomiting)    Stroke (HCC) 2009   Tremor, essential 04/24/2020   Type II diabetes mellitus (HCC)    Past Surgical History:  Procedure Laterality Date   ANAL FISSURE REPAIR  2008   APPENDECTOMY  1986   BREAST BIOPSY Left 2006   neg   BREAST LUMPECTOMY Right 1990's   CESAREAN SECTION  1981; 1983   CHOLECYSTECTOMY  1986   SMALL INTESTINE SURGERY  05/1991   TUBAL LIGATION  1983   Patient Active Problem List   Diagnosis Date Noted   Bone spur 06/13/2021   Acute CVA (cerebrovascular accident) (HCC) 03/07/2021   CVA (cerebral vascular accident) (HCC) 11/01/2020   Chronic pain 04/25/2020   Chronic constipation 04/25/2020   Gastro-esophageal  reflux disease without esophagitis 04/25/2020   Hardening of the aorta (main artery of the heart) (HCC) 04/25/2020   History of iron deficiency anemia 04/25/2020   Hypercholesteremia 04/25/2020   Hypertension 04/25/2020   Personal history of transient ischemic attack (TIA), and cerebral infarction without residual deficits 04/25/2020   Pulmonary emphysema (HCC) 04/25/2020   Vitamin D deficiency 04/25/2020   Tremor, essential 04/24/2020   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   Right lower quadrant abdominal pain 08/18/2018   Spastic hemiplegia affecting nondominant side (HCC) 02/03/2014   Diabetes mellitus (HCC) 09/07/2013   Hypothyroidism 09/07/2013   Type 2 diabetes mellitus without complications (HCC) 09/07/2013   Hemiparesis and alteration of sensations as late effects of stroke (HCC) 01/26/2013   Unsteady gait 01/26/2013   Dizziness and giddiness 01/26/2013    ONSET DATE: 2009  REFERRING DIAG: Z86.73 (ICD-10-CM) - History of stroke   THERAPY DIAG:  Muscle weakness (generalized)  Difficulty in walking, not elsewhere classified  Unsteadiness on feet  Abnormality of gait and mobility  Other abnormalities of gait and mobility  Rationale for Evaluation and Treatment: Rehabilitation  SUBJECTIVE:  SUBJECTIVE STATEMENT:  Pt reports no falls since last session but did hit arm on counter when backing up in wheelchair. Pt has a bandage on this spot at start of treatment.  Patient has some questions regarding recreating therapeutic interventions from last session was given instruction on how to do this and exactly what therapy tools were utilized if she were to be interested in purchasing these.   Pt accompanied by: self  PERTINENT HISTORY: Pt has had one major stroke and 5 mini strokes  since 2009. Pt is here since her doctor wants her to work on her strength and balancea nd she also wants to improve in these categories. Pt has hemianopsia with blindness inferior, left side and superior. Pt initially had stroke in 2009 and was previously able to walk for long distances. Pt has 650 feet driveway that is gravel and used to walk up and down with the cane. Pt has issues with depth perception. Pt is unable to tell angles of concrete and holes in the ground. Pt has service dogs that are retired, one for guidance and one for helping with picking up and retrieving items. Pt had most recent stroke in January 2023 that further limited her field of vision.  Patient wants to work diligently on her strength, balance, mobility, and in order to improve her ability to ambulate on various surfaces in various scenarios.  Patient wanted to come to outpatient services to utilize equipment to improve her strength and a greater capacity than home health was able to assist her with.  She is doing home exercise program involving mostly standing with upper extremity support and activities to improve her strength.  Patient reports she is very nervous to ambulate without contact-guard assist and gait belt without being able to hold onto a wall or steady surface in order to provide her with tactile cues for the areas around her.  PAIN:  Are you having pain? Yes: NPRS scale: 0/10 Pain location: left toe Pain description: pressure from toe on the left pushing up  Aggravating factors: no telling  Relieving factors: no telling   PRECAUTIONS: Fall  WEIGHT BEARING RESTRICTIONS: No  FALLS: Has patient fallen in last 6 months? No  LIVING ENVIRONMENT: Lives with: lives with their family and lives with their spouse Lives in: House/apartment Stairs: No Has following equipment at home: Single point cane, Environmental consultant - 4 wheeled, Wheelchair (manual), Grab bars, and Ramped entry and power wheelchair  PLOF: Independent with  household mobility with device, Independent with community mobility with device, Independent with homemaking with ambulation, and    PATIENT GOALS: Improve gait, improve strength, walk on unsteady surfaces  OBJECTIVE:   DIAGNOSTIC FINDINGS: MRI Brain without contrast  03/07/2021:  1. Small acute ischemic nonhemorrhagic infarcts involving the left thalamus/midbrain and left subinsular white matter. 2. Large remote right PCA distribution infarct. 3. Underlying age-related cerebral atrophy with moderate chronic microvascular ischemic disease with several additional remote lacunar infarcts involving the bilateral basal ganglia and right thalamus.   COGNITION: Overall cognitive status: Within functional limits for tasks assessed   SENSATION: Not tested  COORDINATION: Not tested      POSTURE:  slumped posture seated in wheelchair    LOWER EXTREMITY MMT:    MMT Right Eval Left Eval  Hip flexion 4+ 3+  Hip extension    Hip abduction 4+ 4  Hip adduction 4+ 4  Hip internal rotation    Hip external rotation    Knee flexion 4+ 3+  Knee extension  4+ 3+  Ankle dorsiflexion    Ankle plantarflexion    Ankle inversion    Ankle eversion    (Blank rows = not tested)  BED MOBILITY:  Rolling to Right Modified independence  TRANSFERS: uses UE assist but no device.  Assistive device utilized:  UE assist    Sit to stand:  UE assist    RAMP:  Level of Assistance: Total A Assistive device utilized: Wheelchair (manual) Ramp Comments:       GAIT: Gait pattern:  No assessed visit 1  Distance walked:  Assistive device utilized:  small based cane with forearm support Level of assistance:  assess visit 2    FUNCTIONAL TESTS:  5 times sit to stand: assess visit 2  10 meter walk test: assess visit 2   PATIENT SURVEYS:  FOTO assess visit 2   TODAY'S TREATMENT:                                                                                                                               DATE: 06/23/22    Therex:  -Seated hip march 2 x 10 reps each LE -Seated knee ext 2 x 10 reps each LE- VC to try to extend and perform slow and controlled.  -Seated ham curl with RTB- Left LE x 10 reps -    TA:   The following activities were completed in parallel bars or at balance bar   - LLE on 3 degree incline 2 x 10 step through gait, good foot contact throughout.  Donned 1.5# AW on the R LE : ambulation in bars x 4 laps, improved reciprocal gait, ipmroved supination on the L with ankle weight  Stepping over obstacles in // bars, good foot clearance and awareness.    Unless otherwise stated, CGA was provided and gait belt donned in order to ensure pt safety   PATIENT EDUCATION: Education details: Pt educated throughout session about proper posture and technique with exercises. Improved exercise technique, movement at target joints, use of target muscles after min to mod verbal, visual, tactile cues. Further testing, indications  Person educated: Patient Education method: Explanation Education comprehension: verbalized understanding  HOME EXERCISE PROGRAM: No updates- did recommend performing HEP 3x/week   Updated 05/29/2022: Access Code: [email or web address] URL: [email or web address] Date: 05/29/2022 Prepared by: Temple Pacini  Exercises - Seated March  - 1 x daily - 5 x weekly - 3 sets - 10 reps - Seated Long Arc Quad  - 1 x daily - 7 x weekly - 3 sets - 10 reps -written on handout: seated core twists with holding YTB or RTB 5x weekly 3x10 each side  Previous: Pt performs leg lifts and squats with UE support    GOALS: Goals reviewed with patient? Yes  SHORT TERM GOALS: Target date: 06/24/2022       Patient will be independent in home exercise program to improve strength/mobility for better functional independence with ADLs. Baseline: No HEP currently  Goal status: INITIAL   LONG TERM GOALS: Target date: 08/19/2022   1.  Patient (> 82 years  old) will complete five times sit to stand test in < 15 seconds indicating an increased LE strength and improved balance. Baseline:  Testy second visit ; 05/29/2022: 40 sec pull to stance RUE Goal status: INITIAL  2.  Patient will increase FOTO score by 8 or more points  to demonstrate statistically significant improvement in mobility and quality of life.  Baseline: Test second visit; 05/28/2040: 45 Goal status: INITIAL     3.  Patient will increase 10 meter walk test to >.36m/s as to improve gait speed for better community ambulation and to reduce fall risk. Baseline: Test visit 2 06/19/22: 53 sec  Goal status: INITIAL  4.   Patient will improve ability to ambulate on uneven surfaces as evidenced by ability to ambulate over simulated uneven terrain in clinic prior to improve independence with ambulation. Baseline: Patient not ambulating on uneven terrain Goal status: INITIAL   ASSESSMENT:  CLINICAL IMPRESSION: Pt presents with good motivation for completion of PT activities.  Continue with working toward getting full foot contact on the left lower extremity during gait activities.  Patient did well with 1.5 pound ankle weight down on her involved lower extremity to improve force on appropriate part of the ankle for full foot contact.  Patient instructed in ways she can make this at home as well.  Patient also continues with general lower extremity strengthening and demonstrates improved awareness of lower extremity positioning during more dynamic gait activities such as stepping over obstacles. Pt will continue to benefit from skilled physical therapy intervention to address impairments, improve QOL, and attain therapy goals.    OBJECTIVE IMPAIRMENTS: Abnormal gait, decreased activity tolerance, decreased balance, decreased endurance, decreased strength, decreased safety awareness, hypomobility, impaired flexibility, and impaired UE functional use.   ACTIVITY LIMITATIONS: lifting, bending,  standing, stairs, transfers, bed mobility, and locomotion level  PARTICIPATION LIMITATIONS: meal prep, cleaning, shopping, and community activity  PERSONAL FACTORS: Time since onset of injury/illness/exacerbation and 3+ comorbidities: diabetes, HTN, Multiple previous strokes   are also affecting patient's functional outcome.   REHAB POTENTIAL: Fair secondary to time since onset and severely of visual deficits and L UE funcitonal use   CLINICAL DECISION MAKING: Stable/uncomplicated  EVALUATION COMPLEXITY: Low  PLAN:  PT FREQUENCY: 2x/week  PT DURATION: 12 weeks  PLANNED INTERVENTIONS: Therapeutic exercises, Therapeutic activity, Neuromuscular re-education, Balance training, Gait training, Patient/Family education, Self Care, Joint mobilization, Stair training, Moist heat, Manual therapy, and Re-evaluation  PLAN FOR NEXT SESSION: Continue with progressive LE strengthening, ambulation training as indicated. With ambulation have pt focus on distant objects to prevent R sided list secondary to visual changes from CVA. Improving full foot contact with ambulation as possible   Norman Herrlich, PT 06/23/2022, 4:10 PM

## 2022-06-24 ENCOUNTER — Other Ambulatory Visit: Payer: Self-pay

## 2022-06-24 DIAGNOSIS — K449 Diaphragmatic hernia without obstruction or gangrene: Secondary | ICD-10-CM | POA: Diagnosis not present

## 2022-06-24 DIAGNOSIS — K5010 Crohn's disease of large intestine without complications: Secondary | ICD-10-CM | POA: Diagnosis not present

## 2022-06-24 DIAGNOSIS — E7800 Pure hypercholesterolemia, unspecified: Secondary | ICD-10-CM | POA: Diagnosis not present

## 2022-06-24 DIAGNOSIS — E039 Hypothyroidism, unspecified: Secondary | ICD-10-CM | POA: Diagnosis not present

## 2022-06-24 DIAGNOSIS — J431 Panlobular emphysema: Secondary | ICD-10-CM | POA: Diagnosis not present

## 2022-06-24 DIAGNOSIS — Z794 Long term (current) use of insulin: Secondary | ICD-10-CM | POA: Diagnosis not present

## 2022-06-24 DIAGNOSIS — E119 Type 2 diabetes mellitus without complications: Secondary | ICD-10-CM | POA: Diagnosis not present

## 2022-06-24 DIAGNOSIS — H9193 Unspecified hearing loss, bilateral: Secondary | ICD-10-CM | POA: Diagnosis not present

## 2022-06-24 DIAGNOSIS — I700 Atherosclerosis of aorta: Secondary | ICD-10-CM | POA: Diagnosis not present

## 2022-06-24 DIAGNOSIS — I69051 Hemiplegia and hemiparesis following nontraumatic subarachnoid hemorrhage affecting right dominant side: Secondary | ICD-10-CM | POA: Diagnosis not present

## 2022-06-25 ENCOUNTER — Encounter: Payer: Self-pay | Admitting: Physical Therapy

## 2022-06-25 ENCOUNTER — Ambulatory Visit: Payer: PPO | Attending: Neurology | Admitting: Physical Therapy

## 2022-06-25 ENCOUNTER — Other Ambulatory Visit: Payer: Self-pay

## 2022-06-25 DIAGNOSIS — R2689 Other abnormalities of gait and mobility: Secondary | ICD-10-CM | POA: Insufficient documentation

## 2022-06-25 DIAGNOSIS — R2681 Unsteadiness on feet: Secondary | ICD-10-CM | POA: Diagnosis not present

## 2022-06-25 DIAGNOSIS — R262 Difficulty in walking, not elsewhere classified: Secondary | ICD-10-CM

## 2022-06-25 DIAGNOSIS — R278 Other lack of coordination: Secondary | ICD-10-CM | POA: Diagnosis not present

## 2022-06-25 DIAGNOSIS — M6281 Muscle weakness (generalized): Secondary | ICD-10-CM

## 2022-06-25 DIAGNOSIS — R269 Unspecified abnormalities of gait and mobility: Secondary | ICD-10-CM | POA: Diagnosis not present

## 2022-06-25 NOTE — Therapy (Signed)
OUTPATIENT PHYSICAL THERAPY NEURO TREATMENT NOTE   Patient Name: [name] MRN: [id number] DOB:[birth year], 72 y.o., female Today's Date: 06/25/2022   PCP:   Enid Baas, MD   REFERRING PROVIDER: Morene Crocker, MD   END OF SESSION:  PT End of Session - 06/25/22 1341     Visit Number 6    Number of Visits 24    Date for PT Re-Evaluation 08/19/22    Progress Note Due on Visit 10    PT Start Time 1346    PT Stop Time 1428    PT Time Calculation (min) 42 min    Equipment Utilized During Treatment Gait belt    Activity Tolerance Patient tolerated treatment well    Behavior During Therapy WFL for tasks assessed/performed               Past Medical History:  Diagnosis Date   Abdominal pain 12/06/2013   Abnormality of gait 01/26/2013   Altered bowel habits 12/11/2017   Bruises easily    Constipation    Crohn's disease (HCC)    Depression    Dizziness and giddiness 01/26/2013   Fecal incontinence alternating with constipation 12/11/2017   GERD (gastroesophageal reflux disease)    Hemianopia    left   Hemiparesis and alteration of sensations as late effects of stroke (HCC) 01/26/2013   Hyperlipidemia    Hypertension    Hypothyroidism    Lump in female breast    Panic disorder    PONV (postoperative nausea and vomiting)    Stroke (HCC) 2009   Tremor, essential 04/24/2020   Type II diabetes mellitus (HCC)    Past Surgical History:  Procedure Laterality Date   ANAL FISSURE REPAIR  2008   APPENDECTOMY  1986   BREAST BIOPSY Left 2006   neg   BREAST LUMPECTOMY Right 1990's   CESAREAN SECTION  1981; 1983   CHOLECYSTECTOMY  1986   SMALL INTESTINE SURGERY  05/1991   TUBAL LIGATION  1983   Patient Active Problem List   Diagnosis Date Noted   Bone spur 06/13/2021   Acute CVA (cerebrovascular accident) (HCC) 03/07/2021   CVA (cerebral vascular accident) (HCC) 11/01/2020   Chronic pain 04/25/2020   Chronic constipation 04/25/2020   Gastro-esophageal  reflux disease without esophagitis 04/25/2020   Hardening of the aorta (main artery of the heart) (HCC) 04/25/2020   History of iron deficiency anemia 04/25/2020   Hypercholesteremia 04/25/2020   Hypertension 04/25/2020   Personal history of transient ischemic attack (TIA), and cerebral infarction without residual deficits 04/25/2020   Pulmonary emphysema (HCC) 04/25/2020   Vitamin D deficiency 04/25/2020   Tremor, essential 04/24/2020   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   Right lower quadrant abdominal pain 08/18/2018   Spastic hemiplegia affecting nondominant side (HCC) 02/03/2014   Diabetes mellitus (HCC) 09/07/2013   Hypothyroidism 09/07/2013   Type 2 diabetes mellitus without complications (HCC) 09/07/2013   Hemiparesis and alteration of sensations as late effects of stroke (HCC) 01/26/2013   Unsteady gait 01/26/2013   Dizziness and giddiness 01/26/2013    ONSET DATE: 2009  REFERRING DIAG: Z86.73 (ICD-10-CM) - History of stroke   THERAPY DIAG:  Muscle weakness (generalized)  Difficulty in walking, not elsewhere classified  Unsteadiness on feet  Abnormality of gait and mobility  Other abnormalities of gait and mobility  Rationale for Evaluation and Treatment: Rehabilitation  SUBJECTIVE:  SUBJECTIVE STATEMENT:  Pt reports she had a MD appointment yesterday with her PCP and it went well.    Pt accompanied by: self  PERTINENT HISTORY: Pt has had one major stroke and 5 mini strokes since 2009. Pt is here since her doctor wants her to work on her strength and balancea nd she also wants to improve in these categories. Pt has hemianopsia with blindness inferior, left side and superior. Pt initially had stroke in 2009 and was previously able to walk for long distances. Pt has 650  feet driveway that is gravel and used to walk up and down with the cane. Pt has issues with depth perception. Pt is unable to tell angles of concrete and holes in the ground. Pt has service dogs that are retired, one for guidance and one for helping with picking up and retrieving items. Pt had most recent stroke in January 2023 that further limited her field of vision.  Patient wants to work diligently on her strength, balance, mobility, and in order to improve her ability to ambulate on various surfaces in various scenarios.  Patient wanted to come to outpatient services to utilize equipment to improve her strength and a greater capacity than home health was able to assist her with.  She is doing home exercise program involving mostly standing with upper extremity support and activities to improve her strength.  Patient reports she is very nervous to ambulate without contact-guard assist and gait belt without being able to hold onto a wall or steady surface in order to provide her with tactile cues for the areas around her.  PAIN:  Are you having pain? Yes: NPRS scale: 0/10 Pain location: left toe Pain description: pressure from toe on the left pushing up  Aggravating factors: no telling  Relieving factors: no telling   PRECAUTIONS: Fall  WEIGHT BEARING RESTRICTIONS: No  FALLS: Has patient fallen in last 6 months? No  LIVING ENVIRONMENT: Lives with: lives with their family and lives with their spouse Lives in: House/apartment Stairs: No Has following equipment at home: Single point cane, Environmental consultant - 4 wheeled, Wheelchair (manual), Grab bars, and Ramped entry and power wheelchair  PLOF: Independent with household mobility with device, Independent with community mobility with device, Independent with homemaking with ambulation, and    PATIENT GOALS: Improve gait, improve strength, walk on unsteady surfaces  OBJECTIVE:   DIAGNOSTIC FINDINGS: MRI Brain without contrast  03/07/2021:  1. Small  acute ischemic nonhemorrhagic infarcts involving the left thalamus/midbrain and left subinsular white matter. 2. Large remote right PCA distribution infarct. 3. Underlying age-related cerebral atrophy with moderate chronic microvascular ischemic disease with several additional remote lacunar infarcts involving the bilateral basal ganglia and right thalamus.   COGNITION: Overall cognitive status: Within functional limits for tasks assessed   SENSATION: Not tested  COORDINATION: Not tested   LOWER EXTREMITY MMT:    MMT Right Eval Left Eval  Hip flexion 4+ 3+  Hip extension    Hip abduction 4+ 4  Hip adduction 4+ 4  Hip internal rotation    Hip external rotation    Knee flexion 4+ 3+  Knee extension 4+ 3+  Ankle dorsiflexion    Ankle plantarflexion    Ankle inversion    Ankle eversion    (Blank rows = not tested)  BED MOBILITY:  Rolling to Right Modified independence  TRANSFERS: uses UE assist but no device.  Assistive device utilized:  UE assist    Sit to stand:  UE assist  RAMP:  Level of Assistance: Total A Assistive device utilized: Wheelchair (manual) Ramp Comments:       GAIT: Gait pattern:  No assessed visit 1  Distance walked:  Assistive device utilized:  small based cane with forearm support Level of assistance:  assess visit 2    FUNCTIONAL TESTS:  5 times sit to stand: assess visit 2  10 meter walk test: assess visit 2   PATIENT SURVEYS:  FOTO assess visit 2   TODAY'S TREATMENT:                                                                                                                              DATE: 06/25/22    Therex:  2# AWs donned bilaterally  -Seated hip march 2 x 10 reps each LE -Seated knee ext 2 x 10 reps each LE- difficulty with full ROM at end of sets secondary to muscle fatigue  -Seated ham curl with RTB- Left LE x 10 reps -    TA:   Ambulation along 40 ft area x 2 rounds, CGA throughout. Pt tasked with focus on  object in the distance that is in line with where we wanted to ambulate. Pt had improved success with this throughout.  -seated rest and TE as above performed then repeat above a second time. Improved ability to keep line of ambulation on second round.   Unless otherwise stated, CGA was provided and gait belt donned in order to ensure pt safety   PATIENT EDUCATION: Education details: Pt educated throughout session about proper posture and technique with exercises. Improved exercise technique, movement at target joints, use of target muscles after min to mod verbal, visual, tactile cues. Further testing, indications  Person educated: Patient Education method: Explanation Education comprehension: verbalized understanding  HOME EXERCISE PROGRAM: No updates- did recommend performing HEP 3x/week   Updated 05/29/2022: Access Code: [email or web address] URL: [email or web address] Date: 05/29/2022 Prepared by: Temple Pacini  Exercises - Seated March  - 1 x daily - 5 x weekly - 3 sets - 10 reps - Seated Long Arc Quad  - 1 x daily - 7 x weekly - 3 sets - 10 reps -written on handout: seated core twists with holding YTB or RTB 5x weekly 3x10 each side  Previous: Pt performs leg lifts and squats with UE support    GOALS: Goals reviewed with patient? Yes  SHORT TERM GOALS: Target date: 06/24/2022       Patient will be independent in home exercise program to improve strength/mobility for better functional independence with ADLs. Baseline: No HEP currently  Goal status: INITIAL   LONG TERM GOALS: Target date: 08/19/2022   1.  Patient (> 81 years old) will complete five times sit to stand test in < 15 seconds indicating an increased LE strength and improved balance. Baseline:  Test second visit ; 05/29/2022: 40 sec pull to stance RUE Goal status: INITIAL  2.  Patient will increase FOTO score by  8 or more points  to demonstrate statistically significant improvement in mobility and quality  of life.  Baseline: Test second visit; 05/28/2040: 45 Goal status: INITIAL     3.  Patient will increase 10 meter walk test to >.52m/s as to improve gait speed for better community ambulation and to reduce fall risk. Baseline: Test visit 2 06/19/22: 53 sec  Goal status: INITIAL  4.   Patient will improve ability to ambulate on uneven surfaces as evidenced by ability to ambulate over simulated uneven terrain in clinic prior to improve independence with ambulation. Baseline: Patient not ambulating on uneven terrain Goal status: INITIAL   ASSESSMENT:  CLINICAL IMPRESSION: Pt presents with good motivation for completion of PT activities.  Patient progresses well with ambulation activities today and is able to ambulate with improved efficacy without list to the side when she is trying ambulate in a straight line.  Patient does continue to have list if she has focus on objects outside her line of progression we will continue to work on this at home.  Patient does progress with her lower extremity strength utilizing more resistance this date compared to previously.Pt will continue to benefit from skilled physical therapy intervention to address impairments, improve QOL, and attain therapy goals.     OBJECTIVE IMPAIRMENTS: Abnormal gait, decreased activity tolerance, decreased balance, decreased endurance, decreased strength, decreased safety awareness, hypomobility, impaired flexibility, and impaired UE functional use.   ACTIVITY LIMITATIONS: lifting, bending, standing, stairs, transfers, bed mobility, and locomotion level  PARTICIPATION LIMITATIONS: meal prep, cleaning, shopping, and community activity  PERSONAL FACTORS: Time since onset of injury/illness/exacerbation and 3+ comorbidities: diabetes, HTN, Multiple previous strokes   are also affecting patient's functional outcome.   REHAB POTENTIAL: Fair secondary to time since onset and severely of visual deficits and L UE funcitonal use    CLINICAL DECISION MAKING: Stable/uncomplicated  EVALUATION COMPLEXITY: Low  PLAN:  PT FREQUENCY: 2x/week  PT DURATION: 12 weeks  PLANNED INTERVENTIONS: Therapeutic exercises, Therapeutic activity, Neuromuscular re-education, Balance training, Gait training, Patient/Family education, Self Care, Joint mobilization, Stair training, Moist heat, Manual therapy, and Re-evaluation  PLAN FOR NEXT SESSION: Continue with progressive LE strengthening, ambulation training as indicated. With ambulation have pt focus on distant objects to prevent R sided list secondary to visual changes from CVA. Improving full foot contact with ambulation as possible   Norman Herrlich, PT 06/25/2022, 4:20 PM

## 2022-06-27 ENCOUNTER — Ambulatory Visit: Payer: PPO

## 2022-06-27 DIAGNOSIS — E1142 Type 2 diabetes mellitus with diabetic polyneuropathy: Secondary | ICD-10-CM | POA: Diagnosis not present

## 2022-06-30 ENCOUNTER — Ambulatory Visit: Payer: PPO | Admitting: Physical Therapy

## 2022-06-30 DIAGNOSIS — M6281 Muscle weakness (generalized): Secondary | ICD-10-CM

## 2022-06-30 DIAGNOSIS — R2681 Unsteadiness on feet: Secondary | ICD-10-CM

## 2022-06-30 DIAGNOSIS — R2689 Other abnormalities of gait and mobility: Secondary | ICD-10-CM

## 2022-06-30 DIAGNOSIS — R269 Unspecified abnormalities of gait and mobility: Secondary | ICD-10-CM

## 2022-06-30 DIAGNOSIS — R278 Other lack of coordination: Secondary | ICD-10-CM

## 2022-06-30 DIAGNOSIS — R262 Difficulty in walking, not elsewhere classified: Secondary | ICD-10-CM

## 2022-06-30 NOTE — Therapy (Signed)
OUTPATIENT PHYSICAL THERAPY NEURO TREATMENT NOTE   Patient Name: [name] MRN: [id number] DOB:12-10-1950, 72 y.o., female Today's Date: 06/30/2022   PCP:   Enid Baas, MD   REFERRING PROVIDER: Morene Crocker, MD   END OF SESSION:  PT End of Session - 06/30/22 1432     Visit Number 7    Number of Visits 24    Date for PT Re-Evaluation 08/19/22    Progress Note Due on Visit 10    PT Start Time 1434    PT Stop Time 1515    PT Time Calculation (min) 41 min    Equipment Utilized During Treatment Gait belt    Activity Tolerance Patient tolerated treatment well    Behavior During Therapy WFL for tasks assessed/performed               Past Medical History:  Diagnosis Date   Abdominal pain 12/06/2013   Abnormality of gait 01/26/2013   Altered bowel habits 12/11/2017   Bruises easily    Constipation    Crohn's disease (HCC)    Depression    Dizziness and giddiness 01/26/2013   Fecal incontinence alternating with constipation 12/11/2017   GERD (gastroesophageal reflux disease)    Hemianopia    left   Hemiparesis and alteration of sensations as late effects of stroke (HCC) 01/26/2013   Hyperlipidemia    Hypertension    Hypothyroidism    Lump in female breast    Panic disorder    PONV (postoperative nausea and vomiting)    Stroke (HCC) 2009   Tremor, essential 04/24/2020   Type II diabetes mellitus (HCC)    Past Surgical History:  Procedure Laterality Date   ANAL FISSURE REPAIR  2008   APPENDECTOMY  1986   BREAST BIOPSY Left 2006   neg   BREAST LUMPECTOMY Right 1990's   CESAREAN SECTION  1981; 1983   CHOLECYSTECTOMY  1986   SMALL INTESTINE SURGERY  05/1991   TUBAL LIGATION  1983   Patient Active Problem List   Diagnosis Date Noted   Bone spur 06/13/2021   Acute CVA (cerebrovascular accident) (HCC) 03/07/2021   CVA (cerebral vascular accident) (HCC) 11/01/2020   Chronic pain 04/25/2020   Chronic constipation 04/25/2020   Gastro-esophageal  reflux disease without esophagitis 04/25/2020   Hardening of the aorta (main artery of the heart) (HCC) 04/25/2020   History of iron deficiency anemia 04/25/2020   Hypercholesteremia 04/25/2020   Hypertension 04/25/2020   Personal history of transient ischemic attack (TIA), and cerebral infarction without residual deficits 04/25/2020   Pulmonary emphysema (HCC) 04/25/2020   Vitamin D deficiency 04/25/2020   Tremor, essential 04/24/2020   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   Right lower quadrant abdominal pain 08/18/2018   Spastic hemiplegia affecting nondominant side (HCC) 02/03/2014   Diabetes mellitus (HCC) 09/07/2013   Hypothyroidism 09/07/2013   Type 2 diabetes mellitus without complications (HCC) 09/07/2013   Hemiparesis and alteration of sensations as late effects of stroke (HCC) 01/26/2013   Unsteady gait 01/26/2013   Dizziness and giddiness 01/26/2013    ONSET DATE: 2009  REFERRING DIAG: Z86.73 (ICD-10-CM) - History of stroke   THERAPY DIAG:  Muscle weakness (generalized)  Difficulty in walking, not elsewhere classified  Unsteadiness on feet  Abnormality of gait and mobility  Other abnormalities of gait and mobility  Other lack of coordination  Rationale for Evaluation and Treatment: Rehabilitation  SUBJECTIVE:  SUBJECTIVE STATEMENT:  Pt reports that she is doing well. Brought SPC on this day after reporting that she felt like she was relying too much on forearm support from other AD. Requesting recommendation for better hand support on SPC to reduce thumb pain.    Pt accompanied by: self  PERTINENT HISTORY: Pt has had one major stroke and 5 mini strokes since 2009. Pt is here since her doctor wants her to work on her strength and balancea nd she also wants to improve in  these categories. Pt has hemianopsia with blindness inferior, left side and superior. Pt initially had stroke in 2009 and was previously able to walk for long distances. Pt has 650 feet driveway that is gravel and used to walk up and down with the cane. Pt has issues with depth perception. Pt is unable to tell angles of concrete and holes in the ground. Pt has service dogs that are retired, one for guidance and one for helping with picking up and retrieving items. Pt had most recent stroke in January 2023 that further limited her field of vision.  Patient wants to work diligently on her strength, balance, mobility, and in order to improve her ability to ambulate on various surfaces in various scenarios.  Patient wanted to come to outpatient services to utilize equipment to improve her strength and a greater capacity than home health was able to assist her with.  She is doing home exercise program involving mostly standing with upper extremity support and activities to improve her strength.  Patient reports she is very nervous to ambulate without contact-guard assist and gait belt without being able to hold onto a wall or steady surface in order to provide her with tactile cues for the areas around her.  PAIN:  Are you having pain? Yes: NPRS scale: 0/10 Pain location: left toe Pain description: pressure from toe on the left pushing up  Aggravating factors: no telling  Relieving factors: no telling   PRECAUTIONS: Fall  WEIGHT BEARING RESTRICTIONS: No  FALLS: Has patient fallen in last 6 months? No  LIVING ENVIRONMENT: Lives with: lives with their family and lives with their spouse Lives in: House/apartment Stairs: No Has following equipment at home: Single point cane, Environmental consultant - 4 wheeled, Wheelchair (manual), Grab bars, and Ramped entry and power wheelchair  PLOF: Independent with household mobility with device, Independent with community mobility with device, Independent with homemaking with  ambulation, and    PATIENT GOALS: Improve gait, improve strength, walk on unsteady surfaces  OBJECTIVE:   DIAGNOSTIC FINDINGS: MRI Brain without contrast  03/07/2021:  1. Small acute ischemic nonhemorrhagic infarcts involving the left thalamus/midbrain and left subinsular white matter. 2. Large remote right PCA distribution infarct. 3. Underlying age-related cerebral atrophy with moderate chronic microvascular ischemic disease with several additional remote lacunar infarcts involving the bilateral basal ganglia and right thalamus.   COGNITION: Overall cognitive status: Within functional limits for tasks assessed   SENSATION: Not tested  COORDINATION: Not tested   LOWER EXTREMITY MMT:    MMT Right Eval Left Eval  Hip flexion 4+ 3+  Hip extension    Hip abduction 4+ 4  Hip adduction 4+ 4  Hip internal rotation    Hip external rotation    Knee flexion 4+ 3+  Knee extension 4+ 3+  Ankle dorsiflexion    Ankle plantarflexion    Ankle inversion    Ankle eversion    (Blank rows = not tested)  BED MOBILITY:  Rolling to Right Modified  independence  TRANSFERS: uses UE assist but no device.  Assistive device utilized:  UE assist    Sit to stand:  UE assist    RAMP:  Level of Assistance: Total A Assistive device utilized: Wheelchair (manual) Ramp Comments:       GAIT: Gait pattern:  No assessed visit 1  Distance walked:  Assistive device utilized:  small based cane with forearm support Level of assistance:  assess visit 2    FUNCTIONAL TESTS:  5 times sit to stand: assess visit 2 ; 05/2022: 40 sec pull to stance RUE 10 meter walk test: assess visit 2. 06/19/22: 53 sec   PATIENT SURVEYS:  FOTO 45   TODAY'S TREATMENT:                                                                                                                              DATE: 06/30/22    TA  PT instructed pt in dynamic gait training with various assistive AD.  Forward/reverse step with  UE supported on SPC and QC x 10 BLE with each AD. Pt noted decreased hand pain with ergonomic handle of QC.   Over level ground with SPC 2x 72ft Forward/reverse with SPC 4 x 67ft  Without AD 3 x 48ft.  With SPC to over level ground then navigate fall mat on floor x 17ft total.   Throughout dynamic gait training PT provided CGA for safety with min cues for weight shift over unlevel surface. Cues from PT for improved step length and decreased contact on the RLE to force use of LLE and improve activation of gluteal activation.   Education for use of SPC as well as use of cane with ergonomic handle to reduce pain on thenar eminence of the RUE.    PATIENT EDUCATION: Education details: Pt educated throughout session about proper posture and technique with exercises. Improved exercise technique, movement at target joints, use of target muscles after min to mod verbal, visual, tactile cues. Further testing, indications  Person educated: Patient Education method: Explanation Education comprehension: verbalized understanding  HOME EXERCISE PROGRAM: No updates- did recommend performing HEP 3x/week   Updated 05/29/2022: Access Code: [email or web address] URL: [email or web address] Date: 05/29/2022 Prepared by: Temple Pacini  Exercises - Seated March  - 1 x daily - 5 x weekly - 3 sets - 10 reps - Seated Long Arc Quad  - 1 x daily - 7 x weekly - 3 sets - 10 reps -written on handout: seated core twists with holding YTB or RTB 5x weekly 3x10 each side  Previous: Pt performs leg lifts and squats with UE support    GOALS: Goals reviewed with patient? Yes  SHORT TERM GOALS: Target date: 06/24/2022       Patient will be independent in home exercise program to improve strength/mobility for better functional independence with ADLs. Baseline: No HEP currently  Goal status: INITIAL   LONG TERM GOALS: Target date: 08/19/2022   1.  Patient (>  27 years old) will complete five times sit to stand  test in < 15 seconds indicating an increased LE strength and improved balance. Baseline:  Test second visit ; 05/29/2022: 40 sec pull to stance RUE Goal status: INITIAL  2.  Patient will increase FOTO score by 8 or more points  to demonstrate statistically significant improvement in mobility and quality of life.  Baseline: Test second visit; 05/28/2040: 45 Goal status: INITIAL     3.  Patient will increase 10 meter walk test to >.56m/s as to improve gait speed for better community ambulation and to reduce fall risk. Baseline: Test visit 2 06/19/22: 53 sec  Goal status: INITIAL  4.   Patient will improve ability to ambulate on uneven surfaces as evidenced by ability to ambulate over simulated uneven terrain in clinic prior to improve independence with ambulation. Baseline: Patient not ambulating on uneven terrain Goal status: INITIAL   ASSESSMENT:  CLINICAL IMPRESSION: Pt presents with good motivation for completion of PT activities.  Pt performed gait with various AD in various direction to improve neural motor recruitment. Able to demonstrate increased step length on the RLE with increased stance time on the LLE as well as increased activation of the L gluteals.  Pt will continue to benefit from skilled physical therapy intervention to address impairments, improve QOL, and attain therapy goals.     OBJECTIVE IMPAIRMENTS: Abnormal gait, decreased activity tolerance, decreased balance, decreased endurance, decreased strength, decreased safety awareness, hypomobility, impaired flexibility, and impaired UE functional use.   ACTIVITY LIMITATIONS: lifting, bending, standing, stairs, transfers, bed mobility, and locomotion level  PARTICIPATION LIMITATIONS: meal prep, cleaning, shopping, and community activity  PERSONAL FACTORS: Time since onset of injury/illness/exacerbation and 3+ comorbidities: diabetes, HTN, Multiple previous strokes   are also affecting patient's functional outcome.    REHAB POTENTIAL: Fair secondary to time since onset and severely of visual deficits and L UE funcitonal use   CLINICAL DECISION MAKING: Stable/uncomplicated  EVALUATION COMPLEXITY: Low  PLAN:  PT FREQUENCY: 2x/week  PT DURATION: 12 weeks  PLANNED INTERVENTIONS: Therapeutic exercises, Therapeutic activity, Neuromuscular re-education, Balance training, Gait training, Patient/Family education, Self Care, Joint mobilization, Stair training, Moist heat, Manual therapy, and Re-evaluation  PLAN FOR NEXT SESSION: Continue with progressive LE strengthening, ambulation training as indicated. With ambulation have pt focus on distant objects to prevent R sided list secondary to visual changes from CVA. Improving full foot contact with ambulation as possible   [name] Pop, PT 06/30/2022, 4:55 PM

## 2022-07-02 ENCOUNTER — Encounter: Payer: Self-pay | Admitting: Gastroenterology

## 2022-07-02 ENCOUNTER — Ambulatory Visit: Payer: PPO

## 2022-07-02 DIAGNOSIS — R262 Difficulty in walking, not elsewhere classified: Secondary | ICD-10-CM

## 2022-07-02 DIAGNOSIS — R2681 Unsteadiness on feet: Secondary | ICD-10-CM

## 2022-07-02 DIAGNOSIS — R269 Unspecified abnormalities of gait and mobility: Secondary | ICD-10-CM

## 2022-07-02 DIAGNOSIS — E1142 Type 2 diabetes mellitus with diabetic polyneuropathy: Secondary | ICD-10-CM | POA: Diagnosis not present

## 2022-07-02 DIAGNOSIS — M6281 Muscle weakness (generalized): Secondary | ICD-10-CM | POA: Diagnosis not present

## 2022-07-02 NOTE — Therapy (Signed)
OUTPATIENT PHYSICAL THERAPY NEURO TREATMENT NOTE   Patient Name: [name] MRN: [id number] DOB:05/11/1950, 72 y.o., female Today's Date: 07/02/2022   PCP:   Enid Baas, MD   REFERRING PROVIDER: Morene Crocker, MD   END OF SESSION:  PT End of Session - 07/02/22 1343     Visit Number 8    Number of Visits 24    Date for PT Re-Evaluation 08/19/22    Progress Note Due on Visit 10    PT Start Time 1345    PT Stop Time 1429    PT Time Calculation (min) 44 min    Equipment Utilized During Treatment Gait belt    Activity Tolerance Patient tolerated treatment well    Behavior During Therapy [facility] for tasks assessed/performed                Past Medical History:  Diagnosis Date   Abdominal pain 12/06/2013   Abnormality of gait 01/26/2013   Altered bowel habits 12/11/2017   Bruises easily    Constipation    Crohn's disease (HCC)    Depression    Dizziness and giddiness 01/26/2013   Fecal incontinence alternating with constipation 12/11/2017   GERD (gastroesophageal reflux disease)    Hemianopia    left   Hemiparesis and alteration of sensations as late effects of stroke (HCC) 01/26/2013   Hyperlipidemia    Hypertension    Hypothyroidism    Lump in female breast    Panic disorder    PONV (postoperative nausea and vomiting)    Stroke (HCC) 2009   Tremor, essential 04/24/2020   Type II diabetes mellitus (HCC)    Past Surgical History:  Procedure Laterality Date   ANAL FISSURE REPAIR  2008   APPENDECTOMY  1986   BREAST BIOPSY Left 2006   neg   BREAST LUMPECTOMY Right 1990's   CESAREAN SECTION  1981; 1983   CHOLECYSTECTOMY  1986   SMALL INTESTINE SURGERY  05/1991   TUBAL LIGATION  1983   Patient Active Problem List   Diagnosis Date Noted   Bone spur 06/13/2021   Acute CVA (cerebrovascular accident) (HCC) 03/07/2021   CVA (cerebral vascular accident) (HCC) 11/01/2020   Chronic pain 04/25/2020   Chronic constipation 04/25/2020   Gastro-esophageal  reflux disease without esophagitis 04/25/2020   Hardening of the aorta (main artery of the heart) (HCC) 04/25/2020   History of iron deficiency anemia 04/25/2020   Hypercholesteremia 04/25/2020   Hypertension 04/25/2020   Personal history of transient ischemic attack (TIA), and cerebral infarction without residual deficits 04/25/2020   Pulmonary emphysema (HCC) 04/25/2020   Vitamin D deficiency 04/25/2020   Tremor, essential 04/24/2020   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   Right lower quadrant abdominal pain 08/18/2018   Spastic hemiplegia affecting nondominant side (HCC) 02/03/2014   Diabetes mellitus (HCC) 09/07/2013   Hypothyroidism 09/07/2013   Type 2 diabetes mellitus without complications (HCC) 09/07/2013   Hemiparesis and alteration of sensations as late effects of stroke (HCC) 01/26/2013   Unsteady gait 01/26/2013   Dizziness and giddiness 01/26/2013    ONSET DATE: 2009  REFERRING DIAG: Z86.73 (ICD-10-CM) - History of stroke   THERAPY DIAG:  Muscle weakness (generalized)  Difficulty in walking, not elsewhere classified  Unsteadiness on feet  Abnormality of gait and mobility  Rationale for Evaluation and Treatment: Rehabilitation  SUBJECTIVE:  SUBJECTIVE STATEMENT:  Patient presents in power chair. Was fatigued after last session.    Pt accompanied by: self  PERTINENT HISTORY: Pt has had one major stroke and 5 mini strokes since 2009. Pt is here since her doctor wants her to work on her strength and balancea nd she also wants to improve in these categories. Pt has hemianopsia with blindness inferior, left side and superior. Pt initially had stroke in 2009 and was previously able to walk for long distances. Pt has 650 feet driveway that is gravel and used to walk up and down  with the cane. Pt has issues with depth perception. Pt is unable to tell angles of concrete and holes in the ground. Pt has service dogs that are retired, one for guidance and one for helping with picking up and retrieving items. Pt had most recent stroke in January 2023 that further limited her field of vision.  Patient wants to work diligently on her strength, balance, mobility, and in order to improve her ability to ambulate on various surfaces in various scenarios.  Patient wanted to come to outpatient services to utilize equipment to improve her strength and a greater capacity than home health was able to assist her with.  She is doing home exercise program involving mostly standing with upper extremity support and activities to improve her strength.  Patient reports she is very nervous to ambulate without contact-guard assist and gait belt without being able to hold onto a wall or steady surface in order to provide her with tactile cues for the areas around her.  PAIN:  Are you having pain? Yes: NPRS scale: 0/10 Pain location: left toe Pain description: pressure from toe on the left pushing up  Aggravating factors: no telling  Relieving factors: no telling   PRECAUTIONS: Fall  WEIGHT BEARING RESTRICTIONS: No  FALLS: Has patient fallen in last 6 months? No  LIVING ENVIRONMENT: Lives with: lives with their family and lives with their spouse Lives in: House/apartment Stairs: No Has following equipment at home: Single point cane, Environmental consultant - 4 wheeled, Wheelchair (manual), Grab bars, and Ramped entry and power wheelchair  PLOF: Independent with household mobility with device, Independent with community mobility with device, Independent with homemaking with ambulation, and    PATIENT GOALS: Improve gait, improve strength, walk on unsteady surfaces  OBJECTIVE:   DIAGNOSTIC FINDINGS: MRI Brain without contrast  03/07/2021:  1. Small acute ischemic nonhemorrhagic infarcts involving the left  thalamus/midbrain and left subinsular white matter. 2. Large remote right PCA distribution infarct. 3. Underlying age-related cerebral atrophy with moderate chronic microvascular ischemic disease with several additional remote lacunar infarcts involving the bilateral basal ganglia and right thalamus.   COGNITION: Overall cognitive status: Within functional limits for tasks assessed   SENSATION: Not tested  COORDINATION: Not tested   LOWER EXTREMITY MMT:    MMT Right Eval Left Eval  Hip flexion 4+ 3+  Hip extension    Hip abduction 4+ 4  Hip adduction 4+ 4  Hip internal rotation    Hip external rotation    Knee flexion 4+ 3+  Knee extension 4+ 3+  Ankle dorsiflexion    Ankle plantarflexion    Ankle inversion    Ankle eversion    (Blank rows = not tested)  BED MOBILITY:  Rolling to Right Modified independence  TRANSFERS: uses UE assist but no device.  Assistive device utilized:  UE assist    Sit to stand:  UE assist    RAMP:  Level  of Assistance: Total A Assistive device utilized: Wheelchair (manual) Ramp Comments:       GAIT: Gait pattern:  No assessed visit 1  Distance walked:  Assistive device utilized:  small based cane with forearm support Level of assistance:  assess visit 2    FUNCTIONAL TESTS:  5 times sit to stand: assess visit 2 ; 05/2022: 40 sec pull to stance RUE 10 meter walk test: assess visit 2. 06/19/22: 53 sec   PATIENT SURVEYS:  FOTO 45   TODAY'S TREATMENT:                                                                                                                              DATE: 07/02/22   Therex:   Seated ham curl with RTB- 10x each LE; 2 sets Seated on dynadisc: -static midline positioning 30 seconds x 2 trials -lateral weight shift 15x each side   Activity Description: three on floor, seated  Activity Setting:  The [employer] Random setting was chosen to enhance cognitive processing and agility, providing an unpredictable  environment to simulate real-world scenarios, and fostering quick reactions and adaptability.   Number of Pods:  3 Cycles/Sets:  3 Duration (Time or Hit Count):  30 seconds  Patient Stats   OR Hits:   9, 14, 22  TA:  static stand reach and grab for letters and sort in color order x 8 minutes   10x STS    Unless otherwise stated, CGA was provided and gait belt donned in order to ensure pt safety      PATIENT EDUCATION: Education details: Pt educated throughout session about proper posture and technique with exercises. Improved exercise technique, movement at target joints, use of target muscles after min to mod verbal, visual, tactile cues. Further testing, indications  Person educated: Patient Education method: Explanation Education comprehension: verbalized understanding  HOME EXERCISE PROGRAM: No updates- did recommend performing HEP 3x/week   Updated 05/29/2022: Access Code: [email or web address] URL: [email or web address] Date: 05/29/2022 Prepared by: Temple Pacini  Exercises - Seated March  - 1 x daily - 5 x weekly - 3 sets - 10 reps - Seated Long Arc Quad  - 1 x daily - 7 x weekly - 3 sets - 10 reps -written on handout: seated core twists with holding YTB or RTB 5x weekly 3x10 each side  Previous: Pt performs leg lifts and squats with UE support    GOALS: Goals reviewed with patient? Yes  SHORT TERM GOALS: Target date: 06/24/2022       Patient will be independent in home exercise program to improve strength/mobility for better functional independence with ADLs. Baseline: No HEP currently  Goal status: INITIAL   LONG TERM GOALS: Target date: 08/19/2022   1.  Patient (> 66 years old) will complete five times sit to stand test in < 15 seconds indicating an increased LE strength and improved balance. Baseline:  Test second visit ; 05/29/2022: 40 sec pull to  stance RUE Goal status: INITIAL  2.  Patient will increase FOTO score by 8 or more points  to  demonstrate statistically significant improvement in mobility and quality of life.  Baseline: Test second visit; 05/28/2040: 45 Goal status: INITIAL     3.  Patient will increase 10 meter walk test to >.5m/s as to improve gait speed for better community ambulation and to reduce fall risk. Baseline: Test visit 2 06/19/22: 53 sec  Goal status: INITIAL  4.   Patient will improve ability to ambulate on uneven surfaces as evidenced by ability to ambulate over simulated uneven terrain in clinic prior to improve independence with ambulation. Baseline: Patient not ambulating on uneven terrain Goal status: INITIAL   ASSESSMENT:  CLINICAL IMPRESSION: Patient encouraged to visually scan environment with standing and seated exercises. Coordination with spatial awareness and stability is challenging for patient and will continue to be an area to focus on in future sessions. Patient is highly motivated throughout session and tolerates interventions well with seated rest breaks.  Pt will continue to benefit from skilled physical therapy intervention to address impairments, improve QOL, and attain therapy goals.     OBJECTIVE IMPAIRMENTS: Abnormal gait, decreased activity tolerance, decreased balance, decreased endurance, decreased strength, decreased safety awareness, hypomobility, impaired flexibility, and impaired UE functional use.   ACTIVITY LIMITATIONS: lifting, bending, standing, stairs, transfers, bed mobility, and locomotion level  PARTICIPATION LIMITATIONS: meal prep, cleaning, shopping, and community activity  PERSONAL FACTORS: Time since onset of injury/illness/exacerbation and 3+ comorbidities: diabetes, HTN, Multiple previous strokes   are also affecting patient's functional outcome.   REHAB POTENTIAL: Fair secondary to time since onset and severely of visual deficits and L UE funcitonal use   CLINICAL DECISION MAKING: Stable/uncomplicated  EVALUATION COMPLEXITY: Low  PLAN:  PT  FREQUENCY: 2x/week  PT DURATION: 12 weeks  PLANNED INTERVENTIONS: Therapeutic exercises, Therapeutic activity, Neuromuscular re-education, Balance training, Gait training, Patient/Family education, Self Care, Joint mobilization, Stair training, Moist heat, Manual therapy, and Re-evaluation  PLAN FOR NEXT SESSION: Continue with progressive LE strengthening, ambulation training as indicated. With ambulation have pt focus on distant objects to prevent R sided list secondary to visual changes from CVA. Improving full foot contact with ambulation as possible   Precious Bard, PT 07/02/2022, 3:07 PM

## 2022-07-02 NOTE — Therapy (Signed)
OUTPATIENT PHYSICAL THERAPY NEURO TREATMENT NOTE   Patient Name: [name] MRN: [id number] DOB:07/24/1950, 72 y.o., female Today's Date: 07/02/2022   PCP:   Enid Baas, MD   REFERRING PROVIDER: Morene Crocker, MD   END OF SESSION:      Past Medical History:  Diagnosis Date   Abdominal pain 12/06/2013   Abnormality of gait 01/26/2013   Altered bowel habits 12/11/2017   Bruises easily    Constipation    Crohn's disease (HCC)    Depression    Dizziness and giddiness 01/26/2013   Fecal incontinence alternating with constipation 12/11/2017   GERD (gastroesophageal reflux disease)    Hemianopia    left   Hemiparesis and alteration of sensations as late effects of stroke (HCC) 01/26/2013   Hyperlipidemia    Hypertension    Hypothyroidism    Lump in female breast    Panic disorder    PONV (postoperative nausea and vomiting)    Stroke (HCC) 2009   Tremor, essential 04/24/2020   Type II diabetes mellitus (HCC)    Past Surgical History:  Procedure Laterality Date   ANAL FISSURE REPAIR  2008   APPENDECTOMY  1986   BREAST BIOPSY Left 2006   neg   BREAST LUMPECTOMY Right 1990's   CESAREAN SECTION  1981; 1983   CHOLECYSTECTOMY  1986   SMALL INTESTINE SURGERY  05/1991   TUBAL LIGATION  1983   Patient Active Problem List   Diagnosis Date Noted   Bone spur 06/13/2021   Acute CVA (cerebrovascular accident) (HCC) 03/07/2021   CVA (cerebral vascular accident) (HCC) 11/01/2020   Chronic pain 04/25/2020   Chronic constipation 04/25/2020   Gastro-esophageal reflux disease without esophagitis 04/25/2020   Hardening of the aorta (main artery of the heart) (HCC) 04/25/2020   History of iron deficiency anemia 04/25/2020   Hypercholesteremia 04/25/2020   Hypertension 04/25/2020   Personal history of transient ischemic attack (TIA), and cerebral infarction without residual deficits 04/25/2020   Pulmonary emphysema (HCC) 04/25/2020   Vitamin D deficiency 04/25/2020    Tremor, essential 04/24/2020   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  Rationale for Evaluation and Treatment: Rehabilitation  SUBJECTIVE:  SUBJECTIVE STATEMENT:  Pt reports that she is doing well. Brought SPC on this day after reporting that she felt like she was relying too much on forearm support from other AD. Requesting recommendation for better hand support on SPC to reduce thumb pain.    Pt accompanied by: self  PERTINENT HISTORY: Pt has had one major stroke and 5 mini strokes since 2009. Pt is here since her doctor wants her to work on her strength and balancea nd she also wants to improve in these categories. Pt has hemianopsia with blindness inferior, left side and superior. Pt initially had stroke in 2009 and was previously able to walk for long distances. Pt has 650 feet driveway that is gravel and used to walk up and down with the cane. Pt has issues with depth perception. Pt is unable to tell angles of concrete and holes in the ground. Pt has service dogs that are retired, one for guidance and one for helping with picking up and retrieving items. Pt had most recent stroke in January 2023 that further limited her field of vision.  Patient wants to work diligently on her strength, balance,  mobility, and in order to improve her ability to ambulate on various surfaces in various scenarios.  Patient wanted to come to outpatient services to utilize equipment to improve her strength and a greater capacity than home health was able to assist her with.  She is doing home exercise program involving mostly standing with upper extremity support and activities to improve her strength.  Patient reports she is very nervous to ambulate without contact-guard assist and gait belt without being able to hold onto a wall or steady surface in order to provide her with tactile cues for the areas around her.  PAIN:  Are you having pain? Yes: NPRS scale: 0/10 Pain location: left toe Pain description: pressure from toe on the left pushing up  Aggravating factors: no telling  Relieving factors: no telling   PRECAUTIONS: Fall  WEIGHT BEARING RESTRICTIONS: No  FALLS: Has patient fallen in last 6 months? No  LIVING ENVIRONMENT: Lives with: lives with their family and lives with their spouse Lives in: House/apartment Stairs: No Has following equipment at home: Single point cane, Environmental consultant - 4 wheeled, Wheelchair (manual), Grab bars, and Ramped entry and power wheelchair  PLOF: Independent with household mobility with device, Independent with community mobility with device, Independent with homemaking with ambulation, and    PATIENT GOALS: Improve gait, improve strength, walk on unsteady surfaces  OBJECTIVE:   DIAGNOSTIC FINDINGS: MRI Brain without contrast  03/07/2021:  1. Small acute ischemic nonhemorrhagic infarcts involving the left thalamus/midbrain and left subinsular white matter. 2. Large remote right PCA distribution infarct. 3. Underlying age-related cerebral atrophy with moderate chronic microvascular ischemic disease with several additional remote lacunar infarcts involving the bilateral basal ganglia and right thalamus.   COGNITION: Overall cognitive status: Within functional limits for  tasks assessed   SENSATION: Not tested  COORDINATION: Not tested   LOWER EXTREMITY MMT:    MMT Right Eval Left Eval  Hip flexion 4+ 3+  Hip extension    Hip abduction 4+ 4  Hip adduction 4+ 4  Hip internal rotation    Hip external rotation    Knee flexion 4+ 3+  Knee extension 4+ 3+  Ankle dorsiflexion    Ankle plantarflexion    Ankle inversion    Ankle eversion    (Blank rows = not tested)  BED MOBILITY:  Rolling to Right Modified  independence  TRANSFERS: uses UE assist but no device.  Assistive device utilized:  UE assist    Sit to stand:  UE assist    RAMP:  Level of Assistance: Total A Assistive device utilized: Wheelchair (manual) Ramp Comments:       GAIT: Gait pattern:  No assessed visit 1  Distance walked:  Assistive device utilized:  small based cane with forearm support Level of assistance:  assess visit 2    FUNCTIONAL TESTS:  5 times sit to stand: assess visit 2 ; 05/2022: 40 sec pull to stance RUE 10 meter walk test: assess visit 2. 06/19/22: 53 sec   PATIENT SURVEYS:  FOTO 45   TODAY'S TREATMENT:                                                                                                                              DATE: 07/02/22    TA  PT instructed pt in dynamic gait training with various assistive AD.  Forward/reverse step with UE supported on SPC and QC x 10 BLE with each AD. Pt noted decreased hand pain with ergonomic handle of QC.   Gluteal activation   Over level ground with SPC 2x 35ft Forward/reverse with SPC 4 x 45ft  Without AD 3 x 54ft.  With SPC to over level ground then navigate fall mat on floor x 56ft total.   Throughout dynamic gait training PT provided CGA for safety with min cues for weight shift over unlevel surface. Cues from PT for improved step length and decreased contact on the RLE to force use of LLE and improve activation of gluteal activation.   Education for use of SPC as well as use of cane with  ergonomic handle to reduce pain on thenar eminence of the RUE.    PATIENT EDUCATION: Education details: Pt educated throughout session about proper posture and technique with exercises. Improved exercise technique, movement at target joints, use of target muscles after min to mod verbal, visual, tactile cues. Further testing, indications  Person educated: Patient Education method: Explanation Education comprehension: verbalized understanding  HOME EXERCISE PROGRAM: No updates- did recommend performing HEP 3x/week   Updated 05/29/2022: Access Code: [email or web address] URL: [email or web address] Date: 05/29/2022 Prepared by: Temple Pacini  Exercises - Seated March  - 1 x daily - 5 x weekly - 3 sets - 10 reps - Seated Long Arc Quad  - 1 x daily - 7 x weekly - 3 sets - 10 reps -written on handout: seated core twists with holding YTB or RTB 5x weekly 3x10 each side  Previous: Pt performs leg lifts and squats with UE support    GOALS: Goals reviewed with patient? Yes  SHORT TERM GOALS: Target date: 06/24/2022       Patient will be independent in home exercise program to improve strength/mobility for better functional independence with ADLs. Baseline: No HEP currently  Goal status: INITIAL   LONG TERM GOALS: Target date: 08/19/2022  1.  Patient (> 69 years old) will complete five times sit to stand test in < 15 seconds indicating an increased LE strength and improved balance. Baseline:  Test second visit ; 05/29/2022: 40 sec pull to stance RUE Goal status: INITIAL  2.  Patient will increase FOTO score by 8 or more points  to demonstrate statistically significant improvement in mobility and quality of life.  Baseline: Test second visit; 05/28/2040: 45 Goal status: INITIAL     3.  Patient will increase 10 meter walk test to >.76m/s as to improve gait speed for better community ambulation and to reduce fall risk. Baseline: Test visit 2 06/19/22: 53 sec  Goal status:  INITIAL  4.   Patient will improve ability to ambulate on uneven surfaces as evidenced by ability to ambulate over simulated uneven terrain in clinic prior to improve independence with ambulation. Baseline: Patient not ambulating on uneven terrain Goal status: INITIAL   ASSESSMENT:  CLINICAL IMPRESSION:  Pt presents with good motivation for completion of PT activities.  Pt performed gait with various AD in various direction to improve neural motor recruitment. Able to demonstrate increased step length on the RLE with increased stance time on the LLE as well as increased activation of the L gluteals.  Pt will continue to benefit from skilled physical therapy intervention to address impairments, improve QOL, and attain therapy goals.     OBJECTIVE IMPAIRMENTS: Abnormal gait, decreased activity tolerance, decreased balance, decreased endurance, decreased strength, decreased safety awareness, hypomobility, impaired flexibility, and impaired UE functional use.   ACTIVITY LIMITATIONS: lifting, bending, standing, stairs, transfers, bed mobility, and locomotion level  PARTICIPATION LIMITATIONS: meal prep, cleaning, shopping, and community activity  PERSONAL FACTORS: Time since onset of injury/illness/exacerbation and 3+ comorbidities: diabetes, HTN, Multiple previous strokes   are also affecting patient's functional outcome.   REHAB POTENTIAL: Fair secondary to time since onset and severely of visual deficits and L UE funcitonal use   CLINICAL DECISION MAKING: Stable/uncomplicated  EVALUATION COMPLEXITY: Low  PLAN:  PT FREQUENCY: 2x/week  PT DURATION: 12 weeks  PLANNED INTERVENTIONS: Therapeutic exercises, Therapeutic activity, Neuromuscular re-education, Balance training, Gait training, Patient/Family education, Self Care, Joint mobilization, Stair training, Moist heat, Manual therapy, and Re-evaluation  PLAN FOR NEXT SESSION: Continue with progressive LE strengthening, ambulation  training as indicated. With ambulation have pt focus on distant objects to prevent R sided list secondary to visual changes from CVA. Improving full foot contact with ambulation as possible   Norman Herrlich, PT 07/02/2022, 8:28 AM

## 2022-07-03 ENCOUNTER — Ambulatory Visit: Payer: PPO

## 2022-07-04 ENCOUNTER — Encounter: Payer: Self-pay | Admitting: Podiatry

## 2022-07-04 ENCOUNTER — Ambulatory Visit: Payer: PPO

## 2022-07-06 ENCOUNTER — Other Ambulatory Visit: Payer: Self-pay

## 2022-07-07 ENCOUNTER — Ambulatory Visit: Payer: PPO

## 2022-07-07 ENCOUNTER — Other Ambulatory Visit: Payer: Self-pay

## 2022-07-07 DIAGNOSIS — R2689 Other abnormalities of gait and mobility: Secondary | ICD-10-CM

## 2022-07-07 DIAGNOSIS — M6281 Muscle weakness (generalized): Secondary | ICD-10-CM

## 2022-07-07 DIAGNOSIS — R2681 Unsteadiness on feet: Secondary | ICD-10-CM

## 2022-07-07 DIAGNOSIS — R262 Difficulty in walking, not elsewhere classified: Secondary | ICD-10-CM

## 2022-07-07 DIAGNOSIS — R269 Unspecified abnormalities of gait and mobility: Secondary | ICD-10-CM

## 2022-07-07 DIAGNOSIS — R278 Other lack of coordination: Secondary | ICD-10-CM

## 2022-07-07 MED ORDER — TRESIBA FLEXTOUCH 100 UNIT/ML ~~LOC~~ SOPN
35.0000 [IU] | PEN_INJECTOR | Freq: Every day | SUBCUTANEOUS | 3 refills | Status: DC
  Filled 2022-07-07 (×2): qty 15, 42d supply, fill #0
  Filled 2022-08-06: qty 3, 10d supply, fill #1
  Filled 2022-08-26: qty 15, 42d supply, fill #1
  Filled 2022-10-07: qty 15, 42d supply, fill #2

## 2022-07-07 NOTE — Therapy (Signed)
OUTPATIENT PHYSICAL THERAPY NEURO TREATMENT NOTE   Patient Name: [name] MRN: [id number] DOB:[birth year], 72 y.o., female Today's Date: 07/07/2022   PCP:   Enid Baas, MD   REFERRING PROVIDER: Morene Crocker, MD   END OF SESSION:  PT End of Session - 07/07/22 1306     Visit Number 9    Number of Visits 24    Date for PT Re-Evaluation 08/19/22    Progress Note Due on Visit 10    PT Start Time 1303    PT Stop Time 1346    PT Time Calculation (min) 43 min    Equipment Utilized During Treatment Gait belt    Activity Tolerance Patient tolerated treatment well    Behavior During Therapy WFL for tasks assessed/performed                 Past Medical History:  Diagnosis Date   Abdominal pain 12/06/2013   Abnormality of gait 01/26/2013   Altered bowel habits 12/11/2017   Bruises easily    Constipation    Crohn's disease (HCC)    Depression    Dizziness and giddiness 01/26/2013   Fecal incontinence alternating with constipation 12/11/2017   GERD (gastroesophageal reflux disease)    Hemianopia    left   Hemiparesis and alteration of sensations as late effects of stroke (HCC) 01/26/2013   Hyperlipidemia    Hypertension    Hypothyroidism    Lump in female breast    Panic disorder    PONV (postoperative nausea and vomiting)    Stroke (HCC) 2009   Tremor, essential 04/24/2020   Type II diabetes mellitus (HCC)    Past Surgical History:  Procedure Laterality Date   ANAL FISSURE REPAIR  2008   APPENDECTOMY  1986   BREAST BIOPSY Left 2006   neg   BREAST LUMPECTOMY Right 1990's   CESAREAN SECTION  1981; 1983   CHOLECYSTECTOMY  1986   SMALL INTESTINE SURGERY  05/1991   TUBAL LIGATION  1983   Patient Active Problem List   Diagnosis Date Noted   Bone spur 06/13/2021   Acute CVA (cerebrovascular accident) (HCC) 03/07/2021   CVA (cerebral vascular accident) (HCC) 11/01/2020   Chronic pain 04/25/2020   Chronic constipation 04/25/2020   Gastro-esophageal  reflux disease without esophagitis 04/25/2020   Hardening of the aorta (main artery of the heart) (HCC) 04/25/2020   History of iron deficiency anemia 04/25/2020   Hypercholesteremia 04/25/2020   Hypertension 04/25/2020   Personal history of transient ischemic attack (TIA), and cerebral infarction without residual deficits 04/25/2020   Pulmonary emphysema (HCC) 04/25/2020   Vitamin D deficiency 04/25/2020   Tremor, essential 04/24/2020   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   Right lower quadrant abdominal pain 08/18/2018   Spastic hemiplegia affecting nondominant side (HCC) 02/03/2014   Diabetes mellitus (HCC) 09/07/2013   Hypothyroidism 09/07/2013   Type 2 diabetes mellitus without complications (HCC) 09/07/2013   Hemiparesis and alteration of sensations as late effects of stroke (HCC) 01/26/2013   Unsteady gait 01/26/2013   Dizziness and giddiness 01/26/2013    ONSET DATE: 2009  REFERRING DIAG: Z86.73 (ICD-10-CM) - History of stroke   THERAPY DIAG:  Muscle weakness (generalized)  Difficulty in walking, not elsewhere classified  Unsteadiness on feet  Abnormality of gait and mobility  Other abnormalities of gait and mobility  Other lack of coordination  Rationale for Evaluation and Treatment: Rehabilitation  SUBJECTIVE:  SUBJECTIVE STATEMENT:  I tried walking some at home with my cane and my shoulder blade was sore.   Pt accompanied by: self  PERTINENT HISTORY: Pt has had one major stroke and 5 mini strokes since 2009. Pt is here since her doctor wants her to work on her strength and balancea nd she also wants to improve in these categories. Pt has hemianopsia with blindness inferior, left side and superior. Pt initially had stroke in 2009 and was previously able to walk for long  distances. Pt has 650 feet driveway that is gravel and used to walk up and down with the cane. Pt has issues with depth perception. Pt is unable to tell angles of concrete and holes in the ground. Pt has service dogs that are retired, one for guidance and one for helping with picking up and retrieving items. Pt had most recent stroke in January 2023 that further limited her field of vision.  Patient wants to work diligently on her strength, balance, mobility, and in order to improve her ability to ambulate on various surfaces in various scenarios.  Patient wanted to come to outpatient services to utilize equipment to improve her strength and a greater capacity than home health was able to assist her with.  She is doing home exercise program involving mostly standing with upper extremity support and activities to improve her strength.  Patient reports she is very nervous to ambulate without contact-guard assist and gait belt without being able to hold onto a wall or steady surface in order to provide her with tactile cues for the areas around her.  PAIN:  Are you having pain? Yes: NPRS scale: 0/10 Pain location: left toe Pain description: pressure from toe on the left pushing up  Aggravating factors: no telling  Relieving factors: no telling   PRECAUTIONS: Fall  WEIGHT BEARING RESTRICTIONS: No  FALLS: Has patient fallen in last 6 months? No  LIVING ENVIRONMENT: Lives with: lives with their family and lives with their spouse Lives in: House/apartment Stairs: No Has following equipment at home: Single point cane, Environmental consultant - 4 wheeled, Wheelchair (manual), Grab bars, and Ramped entry and power wheelchair  PLOF: Independent with household mobility with device, Independent with community mobility with device, Independent with homemaking with ambulation, and    PATIENT GOALS: Improve gait, improve strength, walk on unsteady surfaces  OBJECTIVE:   DIAGNOSTIC FINDINGS: MRI Brain without contrast   03/07/2021:  1. Small acute ischemic nonhemorrhagic infarcts involving the left thalamus/midbrain and left subinsular white matter. 2. Large remote right PCA distribution infarct. 3. Underlying age-related cerebral atrophy with moderate chronic microvascular ischemic disease with several additional remote lacunar infarcts involving the bilateral basal ganglia and right thalamus.   COGNITION: Overall cognitive status: Within functional limits for tasks assessed   SENSATION: Not tested  COORDINATION: Not tested   LOWER EXTREMITY MMT:    MMT Right Eval Left Eval  Hip flexion 4+ 3+  Hip extension    Hip abduction 4+ 4  Hip adduction 4+ 4  Hip internal rotation    Hip external rotation    Knee flexion 4+ 3+  Knee extension 4+ 3+  Ankle dorsiflexion    Ankle plantarflexion    Ankle inversion    Ankle eversion    (Blank rows = not tested)  BED MOBILITY:  Rolling to Right Modified independence  TRANSFERS: uses UE assist but no device.  Assistive device utilized:  UE assist    Sit to stand:  UE assist  RAMP:  Level of Assistance: Total A Assistive device utilized: Wheelchair (manual) Ramp Comments:       GAIT: Gait pattern:  No assessed visit 1  Distance walked:  Assistive device utilized:  small based cane with forearm support Level of assistance:  assess visit 2    FUNCTIONAL TESTS:  5 times sit to stand: assess visit 2 ; 05/2022: 40 sec pull to stance RUE 10 meter walk test: assess visit 2. 06/19/22: 53 sec   PATIENT SURVEYS:  FOTO 45   TODAY'S TREATMENT:                                                                                                                              DATE: 07/07/22   Discussion of quad cane previously discussed in clinic as patient was asking for brand name  but unable to locate exact one that she used a couple of sessions ago.    Therapeutic Activities:   Sit to stand -x 10 focusing on scooting forward, appropriate ant  weight shift, LE position (patietnt with tendency to try to stand with rear too far back in seat and using the back of her legs to push into - tipping chair some- improved with VC.    Therex:   Seated scap retraction with GTB  2 sets x 12 reps Seated horizontal abd with GTB 2 sets x 12 reps Seated shoulder ext with GTB x 12 reps  Gait training:   Ambulation forward/backward - 15 feet with CGA, cane, with step to gait sequencing and minimal step length each LE. VC to take longer step each with forward and retro steps with minimal improvement yet no LOB  Ambulation with small based multipoint cane- x 53 feet using CGA- mostly step to gait sequencing- VC to take a longer step with right- slightly improved.       Unless otherwise stated, CGA was provided and gait belt donned in order to ensure pt safety      PATIENT EDUCATION: Education details: Pt educated throughout session about proper posture and technique with exercises. Improved exercise technique, movement at target joints, use of target muscles after min to mod verbal, visual, tactile cues. Further testing, indications  Person educated: Patient Education method: Explanation Education comprehension: verbalized understanding  HOME EXERCISE PROGRAM: No updates- did recommend performing HEP 3x/week   Updated 05/29/2022: Access Code: [email or web address] URL: [email or web address] Date: 05/29/2022 Prepared by: Temple Pacini  Exercises - Seated March  - 1 x daily - 5 x weekly - 3 sets - 10 reps - Seated Long Arc Quad  - 1 x daily - 7 x weekly - 3 sets - 10 reps -written on handout: seated core twists with holding YTB or RTB 5x weekly 3x10 each side  Previous: Pt performs leg lifts and squats with UE support    GOALS: Goals reviewed with patient? Yes  SHORT TERM GOALS: Target date: 06/24/2022       Patient will  be independent in home exercise program to improve strength/mobility for better functional independence  with ADLs. Baseline: No HEP currently  Goal status: INITIAL   LONG TERM GOALS: Target date: 08/19/2022   1.  Patient (> 56 years old) will complete five times sit to stand test in < 15 seconds indicating an increased LE strength and improved balance. Baseline:  Test second visit ; 05/29/2022: 40 sec pull to stance RUE Goal status: INITIAL  2.  Patient will increase FOTO score by 8 or more points  to demonstrate statistically significant improvement in mobility and quality of life.  Baseline: Test second visit; 05/28/2040: 45 Goal status: INITIAL     3.  Patient will increase 10 meter walk test to >.72m/s as to improve gait speed for better community ambulation and to reduce fall risk. Baseline: Test visit 2 06/19/22: 53 sec  Goal status: INITIAL  4.   Patient will improve ability to ambulate on uneven surfaces as evidenced by ability to ambulate over simulated uneven terrain in clinic prior to improve independence with ambulation. Baseline: Patient not ambulating on uneven terrain Goal status: INITIAL   ASSESSMENT:  CLINICAL IMPRESSION: Patient continues to be limited by spatial awareness- including some difficulty with gait - requiring more verbal cues to navigate and unable to really improve her sep length on verbal cues only. She did respond well to postural strengthening without any report of pain.  Pt will continue to benefit from skilled physical therapy intervention to address impairments, improve QOL, and attain therapy goals.     OBJECTIVE IMPAIRMENTS: Abnormal gait, decreased activity tolerance, decreased balance, decreased endurance, decreased strength, decreased safety awareness, hypomobility, impaired flexibility, and impaired UE functional use.   ACTIVITY LIMITATIONS: lifting, bending, standing, stairs, transfers, bed mobility, and locomotion level  PARTICIPATION LIMITATIONS: meal prep, cleaning, shopping, and community activity  PERSONAL FACTORS: Time since onset of  injury/illness/exacerbation and 3+ comorbidities: diabetes, HTN, Multiple previous strokes   are also affecting patient's functional outcome.   REHAB POTENTIAL: Fair secondary to time since onset and severely of visual deficits and L UE funcitonal use   CLINICAL DECISION MAKING: Stable/uncomplicated  EVALUATION COMPLEXITY: Low  PLAN:  PT FREQUENCY: 2x/week  PT DURATION: 12 weeks  PLANNED INTERVENTIONS: Therapeutic exercises, Therapeutic activity, Neuromuscular re-education, Balance training, Gait training, Patient/Family education, Self Care, Joint mobilization, Stair training, Moist heat, Manual therapy, and Re-evaluation  PLAN FOR NEXT SESSION: Continue with progressive LE strengthening, ambulation training as indicated. With ambulation have pt focus on distant objects to prevent R sided list secondary to visual changes from CVA.   Lenda Kelp, PT 07/07/2022, 2:23 PM

## 2022-07-08 ENCOUNTER — Telehealth: Payer: Self-pay | Admitting: *Deleted

## 2022-07-08 NOTE — Telephone Encounter (Signed)
Call me

## 2022-07-09 ENCOUNTER — Ambulatory Visit: Payer: PPO | Admitting: Physical Therapy

## 2022-07-09 ENCOUNTER — Telehealth: Payer: Self-pay | Admitting: Podiatry

## 2022-07-09 NOTE — Telephone Encounter (Signed)
Pt called and has sent a message to you in mychart and has not gotten a response.. She is saying that the clinician at [facility] is saying she needs a different brace and she has included this in the message. Please advise.

## 2022-07-10 ENCOUNTER — Ambulatory Visit: Payer: PPO | Admitting: Physical Therapy

## 2022-07-10 ENCOUNTER — Encounter: Payer: Self-pay | Admitting: Physical Therapy

## 2022-07-10 ENCOUNTER — Encounter: Payer: Self-pay | Admitting: Podiatry

## 2022-07-10 DIAGNOSIS — M6281 Muscle weakness (generalized): Secondary | ICD-10-CM

## 2022-07-10 DIAGNOSIS — R2689 Other abnormalities of gait and mobility: Secondary | ICD-10-CM

## 2022-07-10 DIAGNOSIS — R2681 Unsteadiness on feet: Secondary | ICD-10-CM

## 2022-07-10 DIAGNOSIS — R262 Difficulty in walking, not elsewhere classified: Secondary | ICD-10-CM

## 2022-07-10 DIAGNOSIS — R269 Unspecified abnormalities of gait and mobility: Secondary | ICD-10-CM

## 2022-07-10 NOTE — Progress Notes (Unsigned)
Message received that patient has been seeing multiple physical therapy and other physicians regarding her left lower extremity dropfoot complicated by severe cavovarus deformity.  Suggestion for a high calf custom molded plastic AFO brace to better support the lower extremity and stabilize the cavovarus deformity.  Agree.  Patient's current brace does not create the stability and support that she needs/requires.  Diagnosis: History of dropfoot left lower extremity.  Severe cavovarus deformity left.  -Modified prescription for custom molded plastic high calf AFO brace to support the dropfoot to the left lower extremity as well as stabilize the cavovarus deformity.  Medically necessary to stabilize the lower extremity, reduce the risk of falls, and provide increased stabilized ambulation for the patient.  [name], DPM [facility]  [name], DPM    [address] [place], [place] [address]                Office [phone]  Fax [phone]

## 2022-07-10 NOTE — Therapy (Signed)
OUTPATIENT PHYSICAL THERAPY NEURO TREATMENT NOTE/Physical Therapy Progress Note   Dates of reporting period  05/27/22   to   07/10/22    Patient Name: [name] MRN: [id number] DOB:[birth year], 72 y.o., female Today's Date: 07/10/2022   PCP:   Enid Baas, MD   REFERRING PROVIDER: Morene Crocker, MD   END OF SESSION:  PT End of Session - 07/10/22 1520     Visit Number 10    Number of Visits 24    Date for PT Re-Evaluation 08/19/22    Progress Note Due on Visit 10    PT Start Time 1518    PT Stop Time 1559    PT Time Calculation (min) 41 min    Equipment Utilized During Treatment Gait belt    Activity Tolerance Patient tolerated treatment well    Behavior During Therapy [facility] for tasks assessed/performed                  Past Medical History:  Diagnosis Date   Abdominal pain 12/06/2013   Abnormality of gait 01/26/2013   Altered bowel habits 12/11/2017   Bruises easily    Constipation    Crohn's disease (HCC)    Depression    Dizziness and giddiness 01/26/2013   Fecal incontinence alternating with constipation 12/11/2017   GERD (gastroesophageal reflux disease)    Hemianopia    left   Hemiparesis and alteration of sensations as late effects of stroke (HCC) 01/26/2013   Hyperlipidemia    Hypertension    Hypothyroidism    Lump in female breast    Panic disorder    PONV (postoperative nausea and vomiting)    Stroke (HCC) 2009   Tremor, essential 04/24/2020   Type II diabetes mellitus (HCC)    Past Surgical History:  Procedure Laterality Date   ANAL FISSURE REPAIR  2008   APPENDECTOMY  1986   BREAST BIOPSY Left 2006   neg   BREAST LUMPECTOMY Right 1990's   CESAREAN SECTION  1981; 1983   CHOLECYSTECTOMY  1986   SMALL INTESTINE SURGERY  05/1991   TUBAL LIGATION  1983   Patient Active Problem List   Diagnosis Date Noted   Bone spur 06/13/2021   Acute CVA (cerebrovascular accident) (HCC) 03/07/2021   CVA (cerebral vascular accident) (HCC)  11/01/2020   Chronic pain 04/25/2020   Chronic constipation 04/25/2020   Gastro-esophageal reflux disease without esophagitis 04/25/2020   Hardening of the aorta (main artery of the heart) (HCC) 04/25/2020   History of iron deficiency anemia 04/25/2020   Hypercholesteremia 04/25/2020   Hypertension 04/25/2020   Personal history of transient ischemic attack (TIA), and cerebral infarction without residual deficits 04/25/2020   Pulmonary emphysema (HCC) 04/25/2020   Vitamin D deficiency 04/25/2020   Tremor, essential 04/24/2020   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   Right lower quadrant abdominal pain 08/18/2018   Spastic hemiplegia affecting nondominant side (HCC) 02/03/2014   Diabetes mellitus (HCC) 09/07/2013   Hypothyroidism 09/07/2013   Type 2 diabetes mellitus without complications (HCC) 09/07/2013   Hemiparesis and alteration of sensations as late effects of stroke (HCC) 01/26/2013   Unsteady gait 01/26/2013   Dizziness and giddiness 01/26/2013    ONSET DATE: 2009  REFERRING DIAG: Z86.73 (ICD-10-CM) - History of stroke   THERAPY DIAG:  Muscle weakness (generalized)  Difficulty in walking, not elsewhere classified  Unsteadiness on feet  Abnormality of gait and mobility  Other abnormalities of gait and mobility  Rationale for Evaluation and Treatment: Rehabilitation  SUBJECTIVE:  SUBJECTIVE STATEMENT:  Pt reports she is having a bad day. Her service dog had a bad diagnosis at the vet and she is very upset as a result.     Pt accompanied by: self  PERTINENT HISTORY: Pt has had one major stroke and 5 mini strokes since 2009. Pt is here since her doctor wants her to work on her strength and balancea nd she also wants to improve in these categories. Pt has hemianopsia with  blindness inferior, left side and superior. Pt initially had stroke in 2009 and was previously able to walk for long distances. Pt has 650 feet driveway that is gravel and used to walk up and down with the cane. Pt has issues with depth perception. Pt is unable to tell angles of concrete and holes in the ground. Pt has service dogs that are retired, one for guidance and one for helping with picking up and retrieving items. Pt had most recent stroke in January 2023 that further limited her field of vision.  Patient wants to work diligently on her strength, balance, mobility, and in order to improve her ability to ambulate on various surfaces in various scenarios.  Patient wanted to come to outpatient services to utilize equipment to improve her strength and a greater capacity than home health was able to assist her with.  She is doing home exercise program involving mostly standing with upper extremity support and activities to improve her strength.  Patient reports she is very nervous to ambulate without contact-guard assist and gait belt without being able to hold onto a wall or steady surface in order to provide her with tactile cues for the areas around her.  PAIN:  Are you having pain? Yes: NPRS scale: 0/10 Pain location: left toe Pain description: pressure from toe on the left pushing up  Aggravating factors: no telling  Relieving factors: no telling   PRECAUTIONS: Fall  WEIGHT BEARING RESTRICTIONS: No  FALLS: Has patient fallen in last 6 months? No  LIVING ENVIRONMENT: Lives with: lives with their family and lives with their spouse Lives in: House/apartment Stairs: No Has following equipment at home: Single point cane, Environmental consultant - 4 wheeled, Wheelchair (manual), Grab bars, and Ramped entry and power wheelchair  PLOF: Independent with household mobility with device, Independent with community mobility with device, Independent with homemaking with ambulation, and    PATIENT GOALS: Improve  gait, improve strength, walk on unsteady surfaces  OBJECTIVE:   DIAGNOSTIC FINDINGS: MRI Brain without contrast  03/07/2021:  1. Small acute ischemic nonhemorrhagic infarcts involving the left thalamus/midbrain and left subinsular white matter. 2. Large remote right PCA distribution infarct. 3. Underlying age-related cerebral atrophy with moderate chronic microvascular ischemic disease with several additional remote lacunar infarcts involving the bilateral basal ganglia and right thalamus.   COGNITION: Overall cognitive status: Within functional limits for tasks assessed   SENSATION: Not tested  COORDINATION: Not tested   LOWER EXTREMITY MMT:    MMT Right Eval Left Eval  Hip flexion 4+ 3+  Hip extension    Hip abduction 4+ 4  Hip adduction 4+ 4  Hip internal rotation    Hip external rotation    Knee flexion 4+ 3+  Knee extension 4+ 3+  Ankle dorsiflexion    Ankle plantarflexion    Ankle inversion    Ankle eversion    (Blank rows = not tested)  BED MOBILITY:  Rolling to Right Modified independence  TRANSFERS: uses UE assist but no device.  Assistive device utilized:  UE assist    Sit to stand:  UE assist    RAMP:   Level of Assistance: Total A Assistive device utilized: Wheelchair (manual) Ramp Comments:    GAIT: Gait pattern:  No assessed visit 1  Distance walked:  Assistive device utilized:  small based cane with forearm support Level of assistance:  assess visit 2    FUNCTIONAL TESTS:  5 times sit to stand: assess visit 2 ; 05/2022: 40 sec pull to stance RUE 10 meter walk test: assess visit 2. 06/19/22: 53 sec   PATIENT SURVEYS:  FOTO 45   TODAY'S TREATMENT:                                                                                                                              DATE: 07/10/22  Physical therapy treatment session today consisted of completing assessment of goals and administration of testing as demonstrated and documented in flow  sheet, treatment, and goals section of this note. Addition treatments may be found below.      Unless otherwise stated, CGA was provided and gait belt donned in order to ensure pt safety      PATIENT EDUCATION: Education details: Pt educated throughout session about proper posture and technique with exercises. Improved exercise technique, movement at target joints, use of target muscles after min to mod verbal, visual, tactile cues. Further testing, indications  Person educated: Patient Education method: Explanation Education comprehension: verbalized understanding  HOME EXERCISE PROGRAM: No updates- did recommend performing HEP 3x/week   Updated 05/29/2022: Access Code: [email or web address] URL: [email or web address] Date: 05/29/2022 Prepared by: Temple Pacini  Exercises - Seated March  - 1 x daily - 5 x weekly - 3 sets - 10 reps - Seated Long Arc Quad  - 1 x daily - 7 x weekly - 3 sets - 10 reps -written on handout: seated core twists with holding YTB or RTB 5x weekly 3x10 each side  Previous: Pt performs leg lifts and squats with UE support    GOALS: Goals reviewed with patient? Yes  SHORT TERM GOALS: Target date: 06/24/2022       Patient will be independent in home exercise program to improve strength/mobility for better functional independence with ADLs. Baseline: No HEP currently  Goal status: INITIAL   LONG TERM GOALS: Target date: 08/19/2022   1.  Patient (> 80 years old) will complete five times sit to stand test in < 15 seconds indicating an increased LE strength and improved balance. Baseline:  Test second visit ; 05/29/2022: 40 sec pull to stance RUE 07/10/22: 22.82s Goal status: ONGOING  2.  Patient will increase FOTO score by 8 or more points  to demonstrate statistically significant improvement in mobility and quality of life.  Baseline: Test second visit; 05/28/2040: 45; 07/10/22: 41 Goal status: INITIAL  3.  Patient will increase 10 meter walk test  to >.81m/s as to improve gait speed for better community ambulation and to reduce fall  risk. Baseline: Test visit 2 06/19/22: 53 sec 5/16: 77 sec Goal status: ONGOING   4.   Patient will improve ability to ambulate on uneven surfaces as evidenced by ability to ambulate over simulated uneven terrain in clinic prior to improve independence with ambulation. Baseline: Patient not ambulating on uneven terrain Goal status: INITIAL   ASSESSMENT:  CLINICAL IMPRESSION:  Patient presents to physical therapy for progress note this date.  Patient shows progress with 5 times sit to stand showing improved lower extremity strength as well as decreased risk of falls.  Patient does show impaired 10 m walk test showing increased amount of time to complete comparison to when tested initially.  This may be due to patient's emotional state this date as she was upset regarding the diagnosis for her service dog.  Patient's focus on therapeutic outcome score also was lower this date compared to previously.  This may be due to less confidence with ambulation but could also be due to current emotional state secondary to service dog diagnosis directly before treatment or could also be due to patient becoming more ambulatory and realizing her limitations more accurately. Patient's condition has the potential to improve in response to therapy. Maximum improvement is yet to be obtained. The anticipated improvement is attainable and reasonable in a generally predictable time.     OBJECTIVE IMPAIRMENTS: Abnormal gait, decreased activity tolerance, decreased balance, decreased endurance, decreased strength, decreased safety awareness, hypomobility, impaired flexibility, and impaired UE functional use.   ACTIVITY LIMITATIONS: lifting, bending, standing, stairs, transfers, bed mobility, and locomotion level  PARTICIPATION LIMITATIONS: meal prep, cleaning, shopping, and community activity  PERSONAL FACTORS: Time since onset of  injury/illness/exacerbation and 3+ comorbidities: diabetes, HTN, Multiple previous strokes   are also affecting patient's functional outcome.   REHAB POTENTIAL: Fair secondary to time since onset and severely of visual deficits and L UE funcitonal use   CLINICAL DECISION MAKING: Stable/uncomplicated  EVALUATION COMPLEXITY: Low  PLAN:  PT FREQUENCY: 2x/week  PT DURATION: 12 weeks  PLANNED INTERVENTIONS: Therapeutic exercises, Therapeutic activity, Neuromuscular re-education, Balance training, Gait training, Patient/Family education, Self Care, Joint mobilization, Stair training, Moist heat, Manual therapy, and Re-evaluation  PLAN FOR NEXT SESSION: Continue with progressive LE strengthening, ambulation training as indicated. With ambulation have pt focus on distant objects to prevent R sided list secondary to visual changes from CVA.   Norman Herrlich, PT 07/10/2022, 5:18 PM

## 2022-07-14 ENCOUNTER — Telehealth: Payer: Self-pay | Admitting: Podiatry

## 2022-07-14 ENCOUNTER — Ambulatory Visit: Payer: PPO

## 2022-07-14 DIAGNOSIS — R278 Other lack of coordination: Secondary | ICD-10-CM

## 2022-07-14 DIAGNOSIS — R269 Unspecified abnormalities of gait and mobility: Secondary | ICD-10-CM

## 2022-07-14 DIAGNOSIS — R2681 Unsteadiness on feet: Secondary | ICD-10-CM

## 2022-07-14 DIAGNOSIS — M6281 Muscle weakness (generalized): Secondary | ICD-10-CM

## 2022-07-14 DIAGNOSIS — R262 Difficulty in walking, not elsewhere classified: Secondary | ICD-10-CM

## 2022-07-14 DIAGNOSIS — R2689 Other abnormalities of gait and mobility: Secondary | ICD-10-CM

## 2022-07-14 NOTE — Telephone Encounter (Signed)
Patient called asking about a RX to bve faxed to [facility] for an AFO brace. Note in the chart but a RX needs to be written and faxed to [facility]. I let pt know that RX would be written tomorrow when Dr Logan Bores comes to [facility]. I will fax to [facility] make a copy for pt and call her when its done.

## 2022-07-14 NOTE — Therapy (Signed)
OUTPATIENT PHYSICAL THERAPY NEURO TREATMENT NOTE    Patient Name: [name] MRN: [id number] DOB:20-Oct-1950, 72 y.o., female Today's Date: 07/14/2022   PCP:   Enid Baas, MD   REFERRING PROVIDER: Morene Crocker, MD   END OF SESSION:  PT End of Session - 07/14/22 1503     Visit Number 11    Number of Visits 24    Date for PT Re-Evaluation 08/19/22    Progress Note Due on Visit 10    PT Start Time 1504    PT Stop Time 1553    PT Time Calculation (min) 49 min    Equipment Utilized During Treatment Gait belt    Activity Tolerance Patient tolerated treatment well    Behavior During Therapy WFL for tasks assessed/performed                   Past Medical History:  Diagnosis Date   Abdominal pain 12/06/2013   Abnormality of gait 01/26/2013   Altered bowel habits 12/11/2017   Bruises easily    Constipation    Crohn's disease (HCC)    Depression    Dizziness and giddiness 01/26/2013   Fecal incontinence alternating with constipation 12/11/2017   GERD (gastroesophageal reflux disease)    Hemianopia    left   Hemiparesis and alteration of sensations as late effects of stroke (HCC) 01/26/2013   Hyperlipidemia    Hypertension    Hypothyroidism    Lump in female breast    Panic disorder    PONV (postoperative nausea and vomiting)    Stroke (HCC) 2009   Tremor, essential 04/24/2020   Type II diabetes mellitus (HCC)    Past Surgical History:  Procedure Laterality Date   ANAL FISSURE REPAIR  2008   APPENDECTOMY  1986   BREAST BIOPSY Left 2006   neg   BREAST LUMPECTOMY Right 1990's   CESAREAN SECTION  1981; 1983   CHOLECYSTECTOMY  1986   SMALL INTESTINE SURGERY  05/1991   TUBAL LIGATION  1983   Patient Active Problem List   Diagnosis Date Noted   Bone spur 06/13/2021   Acute CVA (cerebrovascular accident) (HCC) 03/07/2021   CVA (cerebral vascular accident) (HCC) 11/01/2020   Chronic pain 04/25/2020   Chronic constipation 04/25/2020    Gastro-esophageal reflux disease without esophagitis 04/25/2020   Hardening of the aorta (main artery of the heart) (HCC) 04/25/2020   History of iron deficiency anemia 04/25/2020   Hypercholesteremia 04/25/2020   Hypertension 04/25/2020   Personal history of transient ischemic attack (TIA), and cerebral infarction without residual deficits 04/25/2020   Pulmonary emphysema (HCC) 04/25/2020   Vitamin D deficiency 04/25/2020   Tremor, essential 04/24/2020   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   Right lower quadrant abdominal pain 08/18/2018   Spastic hemiplegia affecting nondominant side (HCC) 02/03/2014   Diabetes mellitus (HCC) 09/07/2013   Hypothyroidism 09/07/2013   Type 2 diabetes mellitus without complications (HCC) 09/07/2013   Hemiparesis and alteration of sensations as late effects of stroke (HCC) 01/26/2013   Unsteady gait 01/26/2013   Dizziness and giddiness 01/26/2013    ONSET DATE: 2009  REFERRING DIAG: Z86.73 (ICD-10-CM) - History of stroke   THERAPY DIAG:  Muscle weakness (generalized)  Difficulty in walking, not elsewhere classified  Abnormality of gait and mobility  Unsteadiness on feet  Other abnormalities of gait and mobility  Other lack of coordination  Rationale for Evaluation and Treatment: Rehabilitation  SUBJECTIVE:  SUBJECTIVE STATEMENT:  Patient reports  waiting for MD to sign order for AFO. States otherwise doing okay today.     Pt accompanied by: self  PERTINENT HISTORY: Pt has had one major stroke and 5 mini strokes since 2009. Pt is here since her doctor wants her to work on her strength and balancea nd she also wants to improve in these categories. Pt has hemianopsia with blindness inferior, left side and superior. Pt initially had stroke in 2009 and  was previously able to walk for long distances. Pt has 650 feet driveway that is gravel and used to walk up and down with the cane. Pt has issues with depth perception. Pt is unable to tell angles of concrete and holes in the ground. Pt has service dogs that are retired, one for guidance and one for helping with picking up and retrieving items. Pt had most recent stroke in January 2023 that further limited her field of vision.  Patient wants to work diligently on her strength, balance, mobility, and in order to improve her ability to ambulate on various surfaces in various scenarios.  Patient wanted to come to outpatient services to utilize equipment to improve her strength and a greater capacity than home health was able to assist her with.  She is doing home exercise program involving mostly standing with upper extremity support and activities to improve her strength.  Patient reports she is very nervous to ambulate without contact-guard assist and gait belt without being able to hold onto a wall or steady surface in order to provide her with tactile cues for the areas around her.  PAIN:  Are you having pain? Yes: NPRS scale: 0/10 Pain location: left toe Pain description: pressure from toe on the left pushing up  Aggravating factors: no telling  Relieving factors: no telling   PRECAUTIONS: Fall  WEIGHT BEARING RESTRICTIONS: No  FALLS: Has patient fallen in last 6 months? No  LIVING ENVIRONMENT: Lives with: lives with their family and lives with their spouse Lives in: House/apartment Stairs: No Has following equipment at home: Single point cane, Environmental consultant - 4 wheeled, Wheelchair (manual), Grab bars, and Ramped entry and power wheelchair  PLOF: Independent with household mobility with device, Independent with community mobility with device, Independent with homemaking with ambulation, and    PATIENT GOALS: Improve gait, improve strength, walk on unsteady surfaces  OBJECTIVE:   DIAGNOSTIC  FINDINGS: MRI Brain without contrast  03/07/2021:  1. Small acute ischemic nonhemorrhagic infarcts involving the left thalamus/midbrain and left subinsular white matter. 2. Large remote right PCA distribution infarct. 3. Underlying age-related cerebral atrophy with moderate chronic microvascular ischemic disease with several additional remote lacunar infarcts involving the bilateral basal ganglia and right thalamus.   COGNITION: Overall cognitive status: Within functional limits for tasks assessed   SENSATION: Not tested  COORDINATION: Not tested   LOWER EXTREMITY MMT:    MMT Right Eval Left Eval  Hip flexion 4+ 3+  Hip extension    Hip abduction 4+ 4  Hip adduction 4+ 4  Hip internal rotation    Hip external rotation    Knee flexion 4+ 3+  Knee extension 4+ 3+  Ankle dorsiflexion    Ankle plantarflexion    Ankle inversion    Ankle eversion    (Blank rows = not tested)  BED MOBILITY:  Rolling to Right Modified independence  TRANSFERS: uses UE assist but no device.  Assistive device utilized:  UE assist    Sit to stand:  UE  assist    RAMP:   Level of Assistance: Total A Assistive device utilized: Wheelchair (manual) Ramp Comments:    GAIT: Gait pattern:  No assessed visit 1  Distance walked:  Assistive device utilized:  small based cane with forearm support Level of assistance:  assess visit 2    FUNCTIONAL TESTS:  5 times sit to stand: assess visit 2 ; 05/2022: 40 sec pull to stance RUE 10 meter walk test: assess visit 2. 06/19/22: 53 sec   PATIENT SURVEYS:  FOTO 45   TODAY'S TREATMENT:                                                                                                                              DATE: 07/14/22  Therex:  Right LE- calf raises and ankle DF - 2 sets of 10 reps each (seated in w/c) Standing heel strike to toe off with right LE attempting to increase weight through left LE  while standing at //bars with RUE support x 20 reps.   Sit to stand with VC for ant weight shift- less overall use of pushing back with posterior legs against seat of w/c.  X 10 reps with RUE Support.  Standing hip march alt LE x 10 reps (focusing on left LE WB- observed gluteal weakness)  Seated side step up/over front wheel of manual w/c x 15 reps each  Gait training:  -adjusted small based quad cane to patient height and she ambulated approx 75 feet in gym - mostly demo step to gait sequencing- Increased VC to try to increase step length on right LE for more reciprocal steps. Patient unable to progress stating feeling unsteady with quad cane.        Unless otherwise stated, CGA was provided and gait belt donned in order to ensure pt safety      PATIENT EDUCATION: Education details: Pt educated throughout session about proper posture and technique with exercises. Improved exercise technique, movement at target joints, use of target muscles after min to mod verbal, visual, tactile cues. Further testing, indications  Person educated: Patient Education method: Explanation Education comprehension: verbalized understanding  HOME EXERCISE PROGRAM: No updates- did recommend performing HEP 3x/week   Updated 05/29/2022: Access Code: [email or web address] URL: [email or web address] Date: 05/29/2022 Prepared by: Temple Pacini  Exercises - Seated March  - 1 x daily - 5 x weekly - 3 sets - 10 reps - Seated Long Arc Quad  - 1 x daily - 7 x weekly - 3 sets - 10 reps -written on handout: seated core twists with holding YTB or RTB 5x weekly 3x10 each side  Previous: Pt performs leg lifts and squats with UE support    GOALS: Goals reviewed with patient? Yes  SHORT TERM GOALS: Target date: 06/24/2022       Patient will be independent in home exercise program to improve strength/mobility for better functional independence with ADLs. Baseline: No HEP currently  Goal status: INITIAL  LONG TERM GOALS: Target date: 08/19/2022   1.   Patient (> 37 years old) will complete five times sit to stand test in < 15 seconds indicating an increased LE strength and improved balance. Baseline:  Test second visit ; 05/29/2022: 40 sec pull to stance RUE 07/10/22: 22.82s Goal status: ONGOING  2.  Patient will increase FOTO score by 8 or more points  to demonstrate statistically significant improvement in mobility and quality of life.  Baseline: Test second visit; 05/28/2040: 45; 07/10/22: 41 Goal status: INITIAL  3.  Patient will increase 10 meter walk test to >.23m/s as to improve gait speed for better community ambulation and to reduce fall risk. Baseline: Test visit 2 06/19/22: 53 sec 5/16: 77 sec Goal status: ONGOING   4.   Patient will improve ability to ambulate on uneven surfaces as evidenced by ability to ambulate over simulated uneven terrain in clinic prior to improve independence with ambulation. Baseline: Patient not ambulating on uneven terrain Goal status: INITIAL   ASSESSMENT:  CLINICAL IMPRESSION: Patient presents today with good motivation and performed well overall- ambulated with use of cane with increased time. She continues to require VC for optimal safety and later benefited from heel to toe exercises to demo her ability that she can swing her right LE through and will be continued focus in upcoming visits. She was also weak with gluteals and will benefit from continued therex to improve her overall strength for improved scooting, transfers, standing and walking. Pt will continue to benefit from skilled physical therapy intervention to address impairments, improve QOL, and attain therapy goals.    OBJECTIVE IMPAIRMENTS: Abnormal gait, decreased activity tolerance, decreased balance, decreased endurance, decreased strength, decreased safety awareness, hypomobility, impaired flexibility, and impaired UE functional use.   ACTIVITY LIMITATIONS: lifting, bending, standing, stairs, transfers, bed mobility, and locomotion  level  PARTICIPATION LIMITATIONS: meal prep, cleaning, shopping, and community activity  PERSONAL FACTORS: Time since onset of injury/illness/exacerbation and 3+ comorbidities: diabetes, HTN, Multiple previous strokes   are also affecting patient's functional outcome.   REHAB POTENTIAL: Fair secondary to time since onset and severely of visual deficits and L UE funcitonal use   CLINICAL DECISION MAKING: Stable/uncomplicated  EVALUATION COMPLEXITY: Low  PLAN:  PT FREQUENCY: 2x/week  PT DURATION: 12 weeks  PLANNED INTERVENTIONS: Therapeutic exercises, Therapeutic activity, Neuromuscular re-education, Balance training, Gait training, Patient/Family education, Self Care, Joint mobilization, Stair training, Moist heat, Manual therapy, and Re-evaluation  PLAN FOR NEXT SESSION: Continue with progressive LE strengthening, ambulation training as indicated. With ambulation have pt focus on distant objects to prevent R sided list secondary to visual changes from CVA.   Lenda Kelp, PT 07/14/2022, 4:25 PM

## 2022-07-15 DIAGNOSIS — E039 Hypothyroidism, unspecified: Secondary | ICD-10-CM | POA: Diagnosis not present

## 2022-07-15 DIAGNOSIS — E1159 Type 2 diabetes mellitus with other circulatory complications: Secondary | ICD-10-CM | POA: Diagnosis not present

## 2022-07-15 DIAGNOSIS — E559 Vitamin D deficiency, unspecified: Secondary | ICD-10-CM | POA: Diagnosis not present

## 2022-07-15 DIAGNOSIS — E785 Hyperlipidemia, unspecified: Secondary | ICD-10-CM | POA: Diagnosis not present

## 2022-07-15 DIAGNOSIS — E1169 Type 2 diabetes mellitus with other specified complication: Secondary | ICD-10-CM | POA: Diagnosis not present

## 2022-07-15 DIAGNOSIS — Z794 Long term (current) use of insulin: Secondary | ICD-10-CM | POA: Diagnosis not present

## 2022-07-15 DIAGNOSIS — E1142 Type 2 diabetes mellitus with diabetic polyneuropathy: Secondary | ICD-10-CM | POA: Diagnosis not present

## 2022-07-15 DIAGNOSIS — I152 Hypertension secondary to endocrine disorders: Secondary | ICD-10-CM | POA: Diagnosis not present

## 2022-07-15 DIAGNOSIS — E113393 Type 2 diabetes mellitus with moderate nonproliferative diabetic retinopathy without macular edema, bilateral: Secondary | ICD-10-CM | POA: Diagnosis not present

## 2022-07-15 NOTE — Telephone Encounter (Signed)
Faxed over RX and notes to [facility]. Called pt and let her know.

## 2022-07-16 ENCOUNTER — Ambulatory Visit: Payer: PPO

## 2022-07-16 DIAGNOSIS — R2681 Unsteadiness on feet: Secondary | ICD-10-CM

## 2022-07-16 DIAGNOSIS — R269 Unspecified abnormalities of gait and mobility: Secondary | ICD-10-CM

## 2022-07-16 DIAGNOSIS — R2689 Other abnormalities of gait and mobility: Secondary | ICD-10-CM

## 2022-07-16 DIAGNOSIS — M6281 Muscle weakness (generalized): Secondary | ICD-10-CM | POA: Diagnosis not present

## 2022-07-16 DIAGNOSIS — R278 Other lack of coordination: Secondary | ICD-10-CM

## 2022-07-16 DIAGNOSIS — R262 Difficulty in walking, not elsewhere classified: Secondary | ICD-10-CM

## 2022-07-16 NOTE — Therapy (Signed)
OUTPATIENT PHYSICAL THERAPY NEURO TREATMENT    Patient Name: [name] MRN: [id number] DOB:[birth year], 72 y.o., female Today's Date: 07/16/2022   PCP:   Enid Baas, MD   REFERRING PROVIDER: Morene Crocker, MD   END OF SESSION:  PT End of Session - 07/16/22 1309     Visit Number 13    Number of Visits 24    Date for PT Re-Evaluation 08/19/22    Authorization Type [employer]    Progress Note Due on Visit 20    PT Start Time 1308    PT Stop Time 1340    PT Time Calculation (min) 32 min    Equipment Utilized During Treatment Gait belt    Activity Tolerance Patient tolerated treatment well;No increased pain    Behavior During Therapy [facility] for tasks assessed/performed               Past Medical History:  Diagnosis Date   Abdominal pain 12/06/2013   Abnormality of gait 01/26/2013   Altered bowel habits 12/11/2017   Bruises easily    Constipation    Crohn's disease (HCC)    Depression    Dizziness and giddiness 01/26/2013   Fecal incontinence alternating with constipation 12/11/2017   GERD (gastroesophageal reflux disease)    Hemianopia    left   Hemiparesis and alteration of sensations as late effects of stroke (HCC) 01/26/2013   Hyperlipidemia    Hypertension    Hypothyroidism    Lump in female breast    Panic disorder    PONV (postoperative nausea and vomiting)    Stroke (HCC) 2009   Tremor, essential 04/24/2020   Type II diabetes mellitus (HCC)    Past Surgical History:  Procedure Laterality Date   ANAL FISSURE REPAIR  2008   APPENDECTOMY  1986   BREAST BIOPSY Left 2006   neg   BREAST LUMPECTOMY Right 1990's   CESAREAN SECTION  1981; 1983   CHOLECYSTECTOMY  1986   SMALL INTESTINE SURGERY  05/1991   TUBAL LIGATION  1983   Patient Active Problem List   Diagnosis Date Noted   Bone spur 06/13/2021   Acute CVA (cerebrovascular accident) (HCC) 03/07/2021   CVA (cerebral vascular accident) (HCC) 11/01/2020   Chronic pain  04/25/2020   Chronic constipation 04/25/2020   Gastro-esophageal reflux disease without esophagitis 04/25/2020   Hardening of the aorta (main artery of the heart) (HCC) 04/25/2020   History of iron deficiency anemia 04/25/2020   Hypercholesteremia 04/25/2020   Hypertension 04/25/2020   Personal history of transient ischemic attack (TIA), and cerebral infarction without residual deficits 04/25/2020   Pulmonary emphysema (HCC) 04/25/2020   Vitamin D deficiency 04/25/2020   Tremor, essential 04/24/2020   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   Right lower quadrant abdominal pain 08/18/2018   Spastic hemiplegia affecting nondominant side (HCC) 02/03/2014   Diabetes mellitus (HCC) 09/07/2013   Hypothyroidism 09/07/2013   Type 2 diabetes mellitus without complications (HCC) 09/07/2013   Hemiparesis and alteration of sensations as late effects of stroke (HCC) 01/26/2013   Unsteady gait 01/26/2013   Dizziness and giddiness 01/26/2013    ONSET DATE: 2009  REFERRING DIAG: Z86.73 (ICD-10-CM) - History of stroke   THERAPY DIAG:  Muscle weakness (generalized)  Difficulty in walking, not elsewhere classified  Abnormality of gait and mobility  Unsteadiness on feet  Other abnormalities of gait and mobility  Other lack of coordination  Rationale for Evaluation and Treatment: Rehabilitation  SUBJECTIVE:  SUBJECTIVE STATEMENT:  Patient reports doing well today in general. No medical updates.    Pt accompanied by: self  PERTINENT HISTORY: Pt has had one major stroke and 5 mini strokes since 2009. Pt is here since her doctor wants her to work on her strength and balancea nd she also wants to improve in these categories. Pt has hemianopsia with blindness inferior, left side and superior. Pt initially  had stroke in 2009 and was previously able to walk for long distances. Pt has 650 feet driveway that is gravel and used to walk up and down with the cane. Pt has issues with depth perception. Pt is unable to tell angles of concrete and holes in the ground. Pt has service dogs that are retired, one for guidance and one for helping with picking up and retrieving items. Pt had most recent stroke in January 2023 that further limited her field of vision.  Patient wants to work diligently on her strength, balance, mobility, and in order to improve her ability to ambulate on various surfaces in various scenarios.  Patient wanted to come to outpatient services to utilize equipment to improve her strength and a greater capacity than home health was able to assist her with.  She is doing home exercise program involving mostly standing with upper extremity support and activities to improve her strength.  Patient reports she is very nervous to ambulate without contact-guard assist and gait belt without being able to hold onto a wall or steady surface in order to provide her with tactile cues for the areas around her.  PAIN:  Are you having pain? None  PRECAUTIONS: Fall, hemianopsia  WEIGHT BEARING RESTRICTIONS: No  FALLS: Has patient fallen in last 6 months? No  PATIENT GOALS: Improve gait, improve strength, walk on unsteady surfaces  OBJECTIVE:   TODAY'S TREATMENT:                                                                                                                              DATE: 07/16/22 -STS from WC x10, minguard to supervision assist -STS from WC x5 RUE holding blue weighted ball -AMB overground x217ft c strongarmc RUE cane use: 10 minutes, frequent stops to scan environment and converse -static standing balance at whiteboard with cues for color magnetic find and move to center of board -static standing dry erase drawing objects as cued: bicycle, dog  PATIENT EDUCATION: Education  details: discussed supination tendencies mentioned regarding LLE in context of ABDCT limb in gait/stance Person educated: Patient Education method: Explanation Education comprehension: verbalized understanding  HOME EXERCISE PROGRAM: No updates- did recommend performing HEP 3x/week  Updated 05/29/2022: Access Code: [email or web address] URL: [email or web address] Date: 05/29/2022 Prepared by: Temple Pacini  Exercises - Seated March  - 1 x daily - 5 x weekly - 3 sets - 10 reps - Seated Long Arc Quad  - 1 x daily - 7 x weekly - 3 sets - 10 reps -written on handout: seated  core twists with holding YTB or RTB 5x weekly 3x10 each side  Previous: Pt performs leg lifts and squats with UE support    GOALS: Goals reviewed with patient? Yes  SHORT TERM GOALS: Target date: 06/24/2022    Patient will be independent in home exercise program to improve strength/mobility for better functional independence with ADLs. Baseline: No HEP currently  Goal status: INITIAL  LONG TERM GOALS: Target date: 08/19/2022  1.  Patient (> 73 years old) will complete five times sit to stand test in < 15 seconds indicating an increased LE strength and improved balance. Baseline:  Test second visit ; 05/29/2022: 40 sec pull to stance RUE 07/10/22: 22.82s Goal status: ONGOING  2.  Patient will increase FOTO score by 8 or more points  to demonstrate statistically significant improvement in mobility and quality of life.  Baseline: Test second visit; 05/28/2040: 45; 07/10/22: 41 Goal status: INITIAL  3.  Patient will increase 10 meter walk test to >.69m/s as to improve gait speed for better community ambulation and to reduce fall risk. Baseline: Test visit 2 06/19/22: 53 sec 5/16: 77 sec Goal status: ONGOING   4. Patient will improve ability to ambulate on uneven surfaces as evidenced by ability to ambulate over simulated uneven terrain in clinic prior to improve independence with ambulation. Baseline: Patient not  ambulating on uneven terrain Goal status: INITIAL   ASSESSMENT:  CLINICAL IMPRESSION: Pt continues to progress as able, she is heavily motivated to improve her function, but equally motivated to remain safe. Pt acknowledges challenges in being consistent with HEP. Pt will continue to benefit from skilled physical therapy intervention to address impairments, improve QOL, and achieve therapy goals.   OBJECTIVE IMPAIRMENTS: Abnormal gait, decreased activity tolerance, decreased balance, decreased endurance, decreased strength, decreased safety awareness, hypomobility, impaired flexibility, and impaired UE functional use.   ACTIVITY LIMITATIONS: lifting, bending, standing, stairs, transfers, bed mobility, and locomotion level  PARTICIPATION LIMITATIONS: meal prep, cleaning, shopping, and community activity  PERSONAL FACTORS: Time since onset of injury/illness/exacerbation and 3+ comorbidities: diabetes, HTN, Multiple previous strokes   are also affecting patient's functional outcome.   REHAB POTENTIAL: Fair secondary to time since onset and severely of visual deficits and L UE funcitonal use   CLINICAL DECISION MAKING: Stable/uncomplicated  EVALUATION COMPLEXITY: Low  PLAN:  PT FREQUENCY: 2x/week  PT DURATION: 12 weeks  PLANNED INTERVENTIONS: Therapeutic exercises, Therapeutic activity, Neuromuscular re-education, Balance training, Gait training, Patient/Family education, Self Care, Joint mobilization, Stair training, Moist heat, Manual therapy, and Re-evaluation  PLAN FOR NEXT SESSION: Continue with progressive LE strengthening, ambulation training as indicated. With ambulation have pt focus on distant objects to prevent R sided list secondary to visual changes from CVA.  1:58 PM, 07/16/22 Rosamaria Lints, PT, DPT Physical Therapist - [facility]  Outpatient Physical Therapy- Main Campus [phone]     [place] C, PT 07/16/2022, 1:15 PM

## 2022-07-17 ENCOUNTER — Ambulatory Visit: Payer: PPO

## 2022-07-18 ENCOUNTER — Ambulatory Visit: Payer: PPO

## 2022-07-22 ENCOUNTER — Other Ambulatory Visit: Payer: Self-pay

## 2022-07-22 MED ORDER — TRULICITY 1.5 MG/0.5ML ~~LOC~~ SOAJ
1.5000 mg | SUBCUTANEOUS | 3 refills | Status: DC
  Filled 2022-07-22: qty 2, 28d supply, fill #0
  Filled 2022-08-12: qty 2, 28d supply, fill #1
  Filled 2022-09-09: qty 2, 28d supply, fill #2
  Filled 2022-10-07: qty 2, 28d supply, fill #3

## 2022-07-23 ENCOUNTER — Ambulatory Visit: Payer: PPO

## 2022-07-23 ENCOUNTER — Ambulatory Visit: Payer: PPO | Admitting: Physical Therapy

## 2022-07-23 DIAGNOSIS — R262 Difficulty in walking, not elsewhere classified: Secondary | ICD-10-CM

## 2022-07-23 DIAGNOSIS — M6281 Muscle weakness (generalized): Secondary | ICD-10-CM

## 2022-07-23 DIAGNOSIS — R2689 Other abnormalities of gait and mobility: Secondary | ICD-10-CM

## 2022-07-23 DIAGNOSIS — R278 Other lack of coordination: Secondary | ICD-10-CM

## 2022-07-23 DIAGNOSIS — R269 Unspecified abnormalities of gait and mobility: Secondary | ICD-10-CM

## 2022-07-23 DIAGNOSIS — R2681 Unsteadiness on feet: Secondary | ICD-10-CM

## 2022-07-23 NOTE — Therapy (Signed)
OUTPATIENT PHYSICAL THERAPY NEURO TREATMENT    Patient Name: [name] MRN: [id number] DOB:30-May-1950, 72 y.o., female Today's Date: 07/23/2022   PCP:   Enid Baas, MD   REFERRING PROVIDER: Morene Crocker, MD   END OF SESSION:  PT End of Session - 07/23/22 1304     Visit Number 14    Number of Visits 24    Date for PT Re-Evaluation 08/19/22    Authorization Type [employer]    Progress Note Due on Visit 20    PT Start Time 1304    PT Stop Time 1348    PT Time Calculation (min) 44 min    Equipment Utilized During Treatment Gait belt    Activity Tolerance Patient tolerated treatment well;No increased pain    Behavior During Therapy [facility] for tasks assessed/performed               Past Medical History:  Diagnosis Date   Abdominal pain 12/06/2013   Abnormality of gait 01/26/2013   Altered bowel habits 12/11/2017   Bruises easily    Constipation    Crohn's disease (HCC)    Depression    Dizziness and giddiness 01/26/2013   Fecal incontinence alternating with constipation 12/11/2017   GERD (gastroesophageal reflux disease)    Hemianopia    left   Hemiparesis and alteration of sensations as late effects of stroke (HCC) 01/26/2013   Hyperlipidemia    Hypertension    Hypothyroidism    Lump in female breast    Panic disorder    PONV (postoperative nausea and vomiting)    Stroke (HCC) 2009   Tremor, essential 04/24/2020   Type II diabetes mellitus (HCC)    Past Surgical History:  Procedure Laterality Date   ANAL FISSURE REPAIR  2008   APPENDECTOMY  1986   BREAST BIOPSY Left 2006   neg   BREAST LUMPECTOMY Right 1990's   CESAREAN SECTION  1981; 1983   CHOLECYSTECTOMY  1986   SMALL INTESTINE SURGERY  05/1991   TUBAL LIGATION  1983   Patient Active Problem List   Diagnosis Date Noted   Bone spur 06/13/2021   Acute CVA (cerebrovascular accident) (HCC) 03/07/2021   CVA (cerebral vascular accident) (HCC) 11/01/2020   Chronic pain  04/25/2020   Chronic constipation 04/25/2020   Gastro-esophageal reflux disease without esophagitis 04/25/2020   Hardening of the aorta (main artery of the heart) (HCC) 04/25/2020   History of iron deficiency anemia 04/25/2020   Hypercholesteremia 04/25/2020   Hypertension 04/25/2020   Personal history of transient ischemic attack (TIA), and cerebral infarction without residual deficits 04/25/2020   Pulmonary emphysema (HCC) 04/25/2020   Vitamin D deficiency 04/25/2020   Tremor, essential 04/24/2020   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   Right lower quadrant abdominal pain 08/18/2018   Spastic hemiplegia affecting nondominant side (HCC) 02/03/2014   Diabetes mellitus (HCC) 09/07/2013   Hypothyroidism 09/07/2013   Type 2 diabetes mellitus without complications (HCC) 09/07/2013   Hemiparesis and alteration of sensations as late effects of stroke (HCC) 01/26/2013   Unsteady gait 01/26/2013   Dizziness and giddiness 01/26/2013    ONSET DATE: 2009  REFERRING DIAG: Z86.73 (ICD-10-CM) - History of stroke   THERAPY DIAG:  Muscle weakness (generalized)  Difficulty in walking, not elsewhere classified  Unsteadiness on feet  Abnormality of gait and mobility  Other lack of coordination  Other abnormalities of gait and mobility  Rationale for Evaluation and Treatment: Rehabilitation  SUBJECTIVE:  SUBJECTIVE STATEMENT:  Patient reports doing well today in general. No medical updates.   Pt accompanied by: self  PERTINENT HISTORY: Pt has had one major stroke and 5 mini strokes since 2009. Pt is here since her doctor wants her to work on her strength and balancea nd she also wants to improve in these categories. Pt has hemianopsia with blindness inferior, left side and superior. Pt initially  had stroke in 2009 and was previously able to walk for long distances. Pt has 650 feet driveway that is gravel and used to walk up and down with the cane. Pt has issues with depth perception. Pt is unable to tell angles of concrete and holes in the ground. Pt has service dogs that are retired, one for guidance and one for helping with picking up and retrieving items. Pt had most recent stroke in January 2023 that further limited her field of vision.  Patient wants to work diligently on her strength, balance, mobility, and in order to improve her ability to ambulate on various surfaces in various scenarios.  Patient wanted to come to outpatient services to utilize equipment to improve her strength and a greater capacity than home health was able to assist her with.  She is doing home exercise program involving mostly standing with upper extremity support and activities to improve her strength.  Patient reports she is very nervous to ambulate without contact-guard assist and gait belt without being able to hold onto a wall or steady surface in order to provide her with tactile cues for the areas around her.  PAIN:  Are you having pain? None  PRECAUTIONS: Fall, hemianopsia  WEIGHT BEARING RESTRICTIONS: No  FALLS: Has patient fallen in last 6 months? No  PATIENT GOALS: Improve gait, improve strength, walk on unsteady surfaces  OBJECTIVE:   TODAY'S TREATMENT:                                                                                                                              DATE: 07/23/22  Sit<>stand from [facility] with supervision assist throughout session.   Gait with modified lofstrand x 54ft + 156ft and supervision assist from PT with cues for improved awareness of walk way to prevent veer to the R through gait. Obstacle navigation to step over 3 canes and around cones performed x 2. Pt noted to require significant time to initiate step over cone due to fear of falling and perseveration on  stepping the "right" way. Pt verbalizing fear of falling come from visual deficits and inability to see cane once the lead foot steps over obstacle.    Standing balance:  1 foot on 4in step 2 x 20 sec bil  Foot tap on 4inch step x 8 bil  Sit<>stand from WC pushing from arm rest x 10  Sit<>stand without UE support x 8  CGA for safety with sit>stand without AD, min cues for improved position of the left lower extremity.  Instruction  from PT for for anterior weight shift and eccentric control into chair.      PATIENT EDUCATION: Education details: discussed supination tendencies mentioned regarding LLE in context of ABDCT limb in gait/stance Person educated: Patient Education method: Explanation Education comprehension: verbalized understanding  HOME EXERCISE PROGRAM: No updates- did recommend performing HEP 3x/week  Updated 05/29/2022: Access Code: [email or web address] URL: [email or web address] Date: 05/29/2022 Prepared by: Temple Pacini  Exercises - Seated March  - 1 x daily - 5 x weekly - 3 sets - 10 reps - Seated Long Arc Quad  - 1 x daily - 7 x weekly - 3 sets - 10 reps -written on handout: seated core twists with holding YTB or RTB 5x weekly 3x10 each side  Previous: Pt performs leg lifts and squats with UE support    GOALS: Goals reviewed with patient? Yes  SHORT TERM GOALS: Target date: 06/24/2022    Patient will be independent in home exercise program to improve strength/mobility for better functional independence with ADLs. Baseline: No HEP currently  Goal status: INITIAL  LONG TERM GOALS: Target date: 08/19/2022  1.  Patient (> 58 years old) will complete five times sit to stand test in < 15 seconds indicating an increased LE strength and improved balance. Baseline:  Test second visit ; 05/29/2022: 40 sec pull to stance RUE 07/10/22: 22.82s Goal status: ONGOING  2.  Patient will increase FOTO score by 8 or more points  to demonstrate statistically significant  improvement in mobility and quality of life.  Baseline: Test second visit; 05/28/2040: 45; 07/10/22: 41 Goal status: INITIAL  3.  Patient will increase 10 meter walk test to >.83m/s as to improve gait speed for better community ambulation and to reduce fall risk. Baseline: Test visit 2 06/19/22: 53 sec 5/16: 77 sec Goal status: ONGOING   4. Patient will improve ability to ambulate on uneven surfaces as evidenced by ability to ambulate over simulated uneven terrain in clinic prior to improve independence with ambulation. Baseline: Patient not ambulating on uneven terrain Goal status: INITIAL   ASSESSMENT:  CLINICAL IMPRESSION: Pt continues to progress as able, she is heavily motivated to improve her function, but occasionally limited by fear of falling.  Patient perseverative on proper step mechanics to navigate obstacles due to visual loss.  Patient able to demonstrate improved ability to ambulate in a straight line with minimal cues for obstacle awareness on the left side and use of landmarks in room to prevent veer to the right.  Noted to have improved gait pattern when holding rail on the wall compared to use of modified Loftstrand.  Pt will continue to benefit from skilled physical therapy intervention to address impairments, improve QOL, and achieve therapy goals.   OBJECTIVE IMPAIRMENTS: Abnormal gait, decreased activity tolerance, decreased balance, decreased endurance, decreased strength, decreased safety awareness, hypomobility, impaired flexibility, and impaired UE functional use.   ACTIVITY LIMITATIONS: lifting, bending, standing, stairs, transfers, bed mobility, and locomotion level  PARTICIPATION LIMITATIONS: meal prep, cleaning, shopping, and community activity  PERSONAL FACTORS: Time since onset of injury/illness/exacerbation and 3+ comorbidities: diabetes, HTN, Multiple previous strokes   are also affecting patient's functional outcome.   REHAB POTENTIAL: Fair secondary to time  since onset and severely of visual deficits and L UE funcitonal use   CLINICAL DECISION MAKING: Stable/uncomplicated  EVALUATION COMPLEXITY: Low  PLAN:  PT FREQUENCY: 2x/week  PT DURATION: 12 weeks  PLANNED INTERVENTIONS: Therapeutic exercises, Therapeutic activity, Neuromuscular re-education, Balance training, Gait training, Patient/Family education, Self Care, Joint mobilization,  Stair training, Moist heat, Manual therapy, and Re-evaluation  PLAN FOR NEXT SESSION:   Continue with progressive LE strengthening, ambulation training as indicated. With ambulation have pt focus on distant objects to prevent R sided list secondary to visual changes from CVA.  Grier Rocher PT, DPT  Physical Therapist - [place]  [facility]  2:28 PM 07/23/22

## 2022-07-24 ENCOUNTER — Ambulatory Visit: Payer: PPO

## 2022-07-25 ENCOUNTER — Encounter: Payer: Self-pay | Admitting: Podiatry

## 2022-07-25 ENCOUNTER — Ambulatory Visit: Payer: PPO | Admitting: Podiatry

## 2022-07-25 VITALS — BP 144/80 | HR 77

## 2022-07-25 DIAGNOSIS — B351 Tinea unguium: Secondary | ICD-10-CM

## 2022-07-25 DIAGNOSIS — M79674 Pain in right toe(s): Secondary | ICD-10-CM | POA: Diagnosis not present

## 2022-07-25 DIAGNOSIS — E119 Type 2 diabetes mellitus without complications: Secondary | ICD-10-CM

## 2022-07-25 DIAGNOSIS — M79675 Pain in left toe(s): Secondary | ICD-10-CM | POA: Diagnosis not present

## 2022-07-25 NOTE — Progress Notes (Signed)
Chief Complaint  Patient presents with   Nail Problem    "Do the usual."    SUBJECTIVE Patient with a history of diabetes mellitus minimally ambulatory presenting today in a wheelchair presents to office today complaining of elongated, thickened nails that cause pain while ambulating in shoes.  Patient is unable to trim their own nails. Patient is here for further evaluation and treatment.   Past Medical History:  Diagnosis Date   Abdominal pain 12/06/2013   Abnormality of gait 01/26/2013   Altered bowel habits 12/11/2017   Bruises easily    Constipation    Crohn's disease (HCC)    Depression    Dizziness and giddiness 01/26/2013   Fecal incontinence alternating with constipation 12/11/2017   GERD (gastroesophageal reflux disease)    Hemianopia    left   Hemiparesis and alteration of sensations as late effects of stroke (HCC) 01/26/2013   Hyperlipidemia    Hypertension    Hypothyroidism    Lump in female breast    Panic disorder    PONV (postoperative nausea and vomiting)    Stroke (HCC) 2009   Tremor, essential 04/24/2020   Type II diabetes mellitus (HCC)    Past Surgical History:  Procedure Laterality Date   ANAL FISSURE REPAIR  2008   APPENDECTOMY  1986   BREAST BIOPSY Left 2006   neg   BREAST LUMPECTOMY Right 1990's   CESAREAN SECTION  1981; 1983   CHOLECYSTECTOMY  1986   SMALL INTESTINE SURGERY  05/1991   TUBAL LIGATION  1983   Allergies  Allergen Reactions   Codeine Other (See Comments)    Syncope.   Morphine And Codeine Nausea And Vomiting and Other (See Comments)    Hallucinations.   Baclofen Nausea Only and Other (See Comments)    dizziness   Metformin Hcl Other (See Comments)   Penicillins Other (See Comments)    Family history   Sulfamethoxazole Itching    Hands itching    Elemental Sulfur Itching    Per patient happened years ago.     OBJECTIVE General Patient is awake, alert, and oriented x 3 and in no acute distress. Derm Skin is dry and  supple bilateral. Negative open lesions or macerations. Remaining integument unremarkable. Nails are tender, long, thickened and dystrophic with subungual debris, consistent with onychomycosis, 1-5 bilateral. No signs of infection noted. Vasc  DP and PT pedal pulses palpable bilaterally. Temperature gradient within normal limits.  Neuro Epicritic and protective threshold sensation diminished bilaterally.  Musculoskeletal Exam No symptomatic pedal deformities noted bilateral.  Skin strength diminished.  History of stroke.  Minimally ambulatory mostly in a wheelchair  ASSESSMENT 1. Diabetes Mellitus w/ peripheral neuropathy 2.  Pain due to onychomycosis of toenails bilateral 3.  Encounter for diabetic foot exam  PLAN OF CARE 1. Patient evaluated today.  Comprehensive diabetic foot exam performed today 2. Instructed to maintain good pedal hygiene and foot care. Stressed importance of controlling blood sugar.  3. Mechanical debridement of nails 1-5 bilaterally performed using a nail nipper. Filed with dremel without incident.  4.  Continue AFO LLE.   5.  Return to clinic in 3 mos.     Felecia Shelling, DPM [facility]  Dr. Felecia Shelling, DPM    [address]  [place], [place] [address]                Office [phone]  Fax [phone]

## 2022-07-26 ENCOUNTER — Other Ambulatory Visit (HOSPITAL_BASED_OUTPATIENT_CLINIC_OR_DEPARTMENT_OTHER): Payer: Self-pay

## 2022-07-26 ENCOUNTER — Other Ambulatory Visit: Payer: Self-pay

## 2022-07-27 ENCOUNTER — Other Ambulatory Visit: Payer: Self-pay

## 2022-07-28 ENCOUNTER — Ambulatory Visit: Payer: PPO

## 2022-07-28 ENCOUNTER — Ambulatory Visit: Payer: PPO | Attending: Neurology | Admitting: Physical Therapy

## 2022-07-28 DIAGNOSIS — R2689 Other abnormalities of gait and mobility: Secondary | ICD-10-CM | POA: Diagnosis not present

## 2022-07-28 DIAGNOSIS — R2681 Unsteadiness on feet: Secondary | ICD-10-CM | POA: Diagnosis not present

## 2022-07-28 DIAGNOSIS — M6281 Muscle weakness (generalized): Secondary | ICD-10-CM | POA: Diagnosis not present

## 2022-07-28 DIAGNOSIS — R262 Difficulty in walking, not elsewhere classified: Secondary | ICD-10-CM | POA: Diagnosis not present

## 2022-07-28 DIAGNOSIS — R269 Unspecified abnormalities of gait and mobility: Secondary | ICD-10-CM | POA: Insufficient documentation

## 2022-07-28 DIAGNOSIS — R278 Other lack of coordination: Secondary | ICD-10-CM | POA: Diagnosis not present

## 2022-07-28 NOTE — Therapy (Signed)
OUTPATIENT PHYSICAL THERAPY NEURO TREATMENT    Patient Name: [name] MRN: [id number] DOB:11/08/1950, 72 y.o., female Today's Date: 07/28/2022   PCP:   Enid Baas, MD   REFERRING PROVIDER: Morene Crocker, MD   END OF SESSION:  PT End of Session - 07/28/22 1522     Visit Number 15    Number of Visits 24    Date for PT Re-Evaluation 08/19/22    Authorization Type [employer]    Progress Note Due on Visit 20    PT Start Time 1521    PT Stop Time 1604    PT Time Calculation (min) 43 min    Equipment Utilized During Treatment Gait belt    Activity Tolerance Patient tolerated treatment well;No increased pain    Behavior During Therapy [facility] for tasks assessed/performed               Past Medical History:  Diagnosis Date   Abdominal pain 12/06/2013   Abnormality of gait 01/26/2013   Altered bowel habits 12/11/2017   Bruises easily    Constipation    Crohn's disease (HCC)    Depression    Dizziness and giddiness 01/26/2013   Fecal incontinence alternating with constipation 12/11/2017   GERD (gastroesophageal reflux disease)    Hemianopia    left   Hemiparesis and alteration of sensations as late effects of stroke (HCC) 01/26/2013   Hyperlipidemia    Hypertension    Hypothyroidism    Lump in female breast    Panic disorder    PONV (postoperative nausea and vomiting)    Stroke (HCC) 2009   Tremor, essential 04/24/2020   Type II diabetes mellitus (HCC)    Past Surgical History:  Procedure Laterality Date   ANAL FISSURE REPAIR  2008   APPENDECTOMY  1986   BREAST BIOPSY Left 2006   neg   BREAST LUMPECTOMY Right 1990's   CESAREAN SECTION  1981; 1983   CHOLECYSTECTOMY  1986   SMALL INTESTINE SURGERY  05/1991   TUBAL LIGATION  1983   Patient Active Problem List   Diagnosis Date Noted   Bone spur 06/13/2021   Acute CVA (cerebrovascular accident) (HCC) 03/07/2021   CVA (cerebral vascular accident) (HCC) 11/01/2020   Chronic pain  04/25/2020   Chronic constipation 04/25/2020   Gastro-esophageal reflux disease without esophagitis 04/25/2020   Hardening of the aorta (main artery of the heart) (HCC) 04/25/2020   History of iron deficiency anemia 04/25/2020   Hypercholesteremia 04/25/2020   Hypertension 04/25/2020   Personal history of transient ischemic attack (TIA), and cerebral infarction without residual deficits 04/25/2020   Pulmonary emphysema (HCC) 04/25/2020   Vitamin D deficiency 04/25/2020   Tremor, essential 04/24/2020   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   Right lower quadrant abdominal pain 08/18/2018   Spastic hemiplegia affecting nondominant side (HCC) 02/03/2014   Diabetes mellitus (HCC) 09/07/2013   Hypothyroidism 09/07/2013   Type 2 diabetes mellitus without complications (HCC) 09/07/2013   Hemiparesis and alteration of sensations as late effects of stroke (HCC) 01/26/2013   Unsteady gait 01/26/2013   Dizziness and giddiness 01/26/2013    ONSET DATE: 2009  REFERRING DIAG: Z86.73 (ICD-10-CM) - History of stroke   THERAPY DIAG:  Muscle weakness (generalized)  Difficulty in walking, not elsewhere classified  Unsteadiness on feet  Abnormality of gait and mobility  Other abnormalities of gait and mobility  Other lack of coordination  Rationale for Evaluation and Treatment: Rehabilitation  SUBJECTIVE:  SUBJECTIVE STATEMENT:  Patient reports doing well today in general. No medical updates.  Questions about use of rollator for community access.   Reports that she will be getting new AFO on 6/27. Is concerned that is will effect her gait pattern.    Pt accompanied by: self  PERTINENT HISTORY: Pt has had one major stroke and 5 mini strokes since 2009. Pt is here since her doctor wants her to work  on her strength and balancea nd she also wants to improve in these categories. Pt has hemianopsia with blindness inferior, left side and superior. Pt initially had stroke in 2009 and was previously able to walk for long distances. Pt has 650 feet driveway that is gravel and used to walk up and down with the cane. Pt has issues with depth perception. Pt is unable to tell angles of concrete and holes in the ground. Pt has service dogs that are retired, one for guidance and one for helping with picking up and retrieving items. Pt had most recent stroke in January 2023 that further limited her field of vision.  Patient wants to work diligently on her strength, balance, mobility, and in order to improve her ability to ambulate on various surfaces in various scenarios.  Patient wanted to come to outpatient services to utilize equipment to improve her strength and a greater capacity than home health was able to assist her with.  She is doing home exercise program involving mostly standing with upper extremity support and activities to improve her strength.  Patient reports she is very nervous to ambulate without contact-guard assist and gait belt without being able to hold onto a wall or steady surface in order to provide her with tactile cues for the areas around her.  PAIN:  Are you having pain? None  PRECAUTIONS: Fall, hemianopsia  WEIGHT BEARING RESTRICTIONS: No  FALLS: Has patient fallen in last 6 months? No  PATIENT GOALS: Improve gait, improve strength, walk on unsteady surfaces  OBJECTIVE:   TODAY'S TREATMENT:                                                                                                                              DATE: 07/28/22  Sit<>stand from [facility] with supervision assist throughout session.   Gait with loftstrand x 37ft additional gait with cane without forearm support x 35ft.  Gait with rollator. 2 X 78ft and 2x 53ft.    CGA from PT for safety throughout neuromuscular  re-ed through gait training.   Attempted use of forearm support rollator, but tone in the LUE limits safety and use of support through L UE.   Pt able to control Rollator with use of RUE only with appropriate use of brake to improved control of AD. Noted to have improved hip/knee flexion and  with use of rollator, but circumduction and excessive weight shift with Lofstrand     PATIENT EDUCATION: Education details: discussed supination tendencies mentioned regarding LLE in  context of ABDCT limb in gait/stance Person educated: Patient Education method: Explanation Education comprehension: verbalized understanding  HOME EXERCISE PROGRAM: No updates- did recommend performing HEP 3x/week  Updated 05/29/2022: Access Code: [email or web address] URL: [email or web address] Date: 05/29/2022 Prepared by: Temple Pacini  Exercises - Seated March  - 1 x daily - 5 x weekly - 3 sets - 10 reps - Seated Long Arc Quad  - 1 x daily - 7 x weekly - 3 sets - 10 reps -written on handout: seated core twists with holding YTB or RTB 5x weekly 3x10 each side  Previous: Pt performs leg lifts and squats with UE support    GOALS: Goals reviewed with patient? Yes  SHORT TERM GOALS: Target date: 06/24/2022    Patient will be independent in home exercise program to improve strength/mobility for better functional independence with ADLs. Baseline: No HEP currently  Goal status: INITIAL  LONG TERM GOALS: Target date: 08/19/2022  1.  Patient (> 48 years old) will complete five times sit to stand test in < 15 seconds indicating an increased LE strength and improved balance. Baseline:  Test second visit ; 05/29/2022: 40 sec pull to stance RUE 07/10/22: 22.82s Goal status: ONGOING  2.  Patient will increase FOTO score by 8 or more points  to demonstrate statistically significant improvement in mobility and quality of life.  Baseline: Test second visit; 05/28/2040: 45; 07/10/22: 41 Goal status: INITIAL  3.  Patient  will increase 10 meter walk test to >.75m/s as to improve gait speed for better community ambulation and to reduce fall risk. Baseline: Test visit 2 06/19/22: 53 sec 5/16: 77 sec Goal status: ONGOING   4. Patient will improve ability to ambulate on uneven surfaces as evidenced by ability to ambulate over simulated uneven terrain in clinic prior to improve independence with ambulation. Baseline: Patient not ambulating on uneven terrain Goal status: INITIAL   ASSESSMENT:  CLINICAL IMPRESSION: Pt continues to progress as able, she is heavily motivated to improve her function. Improved efficacy and gait pattern with use of rollator over Lofstrand cane with increased hip/knee flexion, pelvic rotation, and proper weight shift R and L. Also noted to have improved ability to maintain straight path with rollator over Lofstrand cane.  Pt will continue to benefit from skilled physical therapy intervention to address impairments, improve QOL, and achieve therapy goals.   OBJECTIVE IMPAIRMENTS: Abnormal gait, decreased activity tolerance, decreased balance, decreased endurance, decreased strength, decreased safety awareness, hypomobility, impaired flexibility, and impaired UE functional use.   ACTIVITY LIMITATIONS: lifting, bending, standing, stairs, transfers, bed mobility, and locomotion level  PARTICIPATION LIMITATIONS: meal prep, cleaning, shopping, and community activity  PERSONAL FACTORS: Time since onset of injury/illness/exacerbation and 3+ comorbidities: diabetes, HTN, Multiple previous strokes   are also affecting patient's functional outcome.   REHAB POTENTIAL: Fair secondary to time since onset and severely of visual deficits and L UE funcitonal use   CLINICAL DECISION MAKING: Stable/uncomplicated  EVALUATION COMPLEXITY: Low  PLAN:  PT FREQUENCY: 2x/week  PT DURATION: 12 weeks  PLANNED INTERVENTIONS: Therapeutic exercises, Therapeutic activity, Neuromuscular re-education, Balance  training, Gait training, Patient/Family education, Self Care, Joint mobilization, Stair training, Moist heat, Manual therapy, and Re-evaluation  PLAN FOR NEXT SESSION:   Continue with progressive LE strengthening, ambulation training as indicated.  Dual task with gait training.   Grier Rocher PT, DPT  Physical Therapist - [place]  [facility]  4:53 PM 07/28/22

## 2022-07-30 ENCOUNTER — Ambulatory Visit: Payer: PPO

## 2022-07-30 ENCOUNTER — Ambulatory Visit: Payer: PPO | Admitting: Physical Therapy

## 2022-07-30 DIAGNOSIS — I639 Cerebral infarction, unspecified: Secondary | ICD-10-CM | POA: Diagnosis not present

## 2022-07-30 DIAGNOSIS — E7800 Pure hypercholesterolemia, unspecified: Secondary | ICD-10-CM | POA: Diagnosis not present

## 2022-07-30 DIAGNOSIS — E119 Type 2 diabetes mellitus without complications: Secondary | ICD-10-CM | POA: Diagnosis not present

## 2022-07-30 DIAGNOSIS — I10 Essential (primary) hypertension: Secondary | ICD-10-CM | POA: Diagnosis not present

## 2022-07-30 DIAGNOSIS — E1169 Type 2 diabetes mellitus with other specified complication: Secondary | ICD-10-CM | POA: Diagnosis not present

## 2022-07-31 ENCOUNTER — Ambulatory Visit: Payer: PPO

## 2022-07-31 ENCOUNTER — Ambulatory Visit: Payer: PPO | Admitting: Physical Therapy

## 2022-07-31 DIAGNOSIS — R2689 Other abnormalities of gait and mobility: Secondary | ICD-10-CM

## 2022-07-31 DIAGNOSIS — M6281 Muscle weakness (generalized): Secondary | ICD-10-CM

## 2022-07-31 DIAGNOSIS — R262 Difficulty in walking, not elsewhere classified: Secondary | ICD-10-CM

## 2022-07-31 DIAGNOSIS — R278 Other lack of coordination: Secondary | ICD-10-CM

## 2022-07-31 DIAGNOSIS — R2681 Unsteadiness on feet: Secondary | ICD-10-CM

## 2022-07-31 DIAGNOSIS — R269 Unspecified abnormalities of gait and mobility: Secondary | ICD-10-CM

## 2022-07-31 NOTE — Therapy (Signed)
OUTPATIENT PHYSICAL THERAPY NEURO TREATMENT    Patient Name: [name] MRN: [id number] DOB:September 18, 1950, 72 y.o., female Today's Date: 07/31/2022   PCP:   Enid Baas, MD   REFERRING PROVIDER: Morene Crocker, MD   END OF SESSION:  PT End of Session - 07/31/22 1429     Visit Number 16    Number of Visits 24    Date for PT Re-Evaluation 08/19/22    Authorization Type [employer]    Progress Note Due on Visit 20    PT Start Time 1432    PT Stop Time 1515    PT Time Calculation (min) 43 min    Equipment Utilized During Treatment Gait belt    Activity Tolerance Patient tolerated treatment well;No increased pain    Behavior During Therapy [facility] for tasks assessed/performed               Past Medical History:  Diagnosis Date   Abdominal pain 12/06/2013   Abnormality of gait 01/26/2013   Altered bowel habits 12/11/2017   Bruises easily    Constipation    Crohn's disease (HCC)    Depression    Dizziness and giddiness 01/26/2013   Fecal incontinence alternating with constipation 12/11/2017   GERD (gastroesophageal reflux disease)    Hemianopia    left   Hemiparesis and alteration of sensations as late effects of stroke (HCC) 01/26/2013   Hyperlipidemia    Hypertension    Hypothyroidism    Lump in female breast    Panic disorder    PONV (postoperative nausea and vomiting)    Stroke (HCC) 2009   Tremor, essential 04/24/2020   Type II diabetes mellitus (HCC)    Past Surgical History:  Procedure Laterality Date   ANAL FISSURE REPAIR  2008   APPENDECTOMY  1986   BREAST BIOPSY Left 2006   neg   BREAST LUMPECTOMY Right 1990's   CESAREAN SECTION  1981; 1983   CHOLECYSTECTOMY  1986   SMALL INTESTINE SURGERY  05/1991   TUBAL LIGATION  1983   Patient Active Problem List   Diagnosis Date Noted   Bone spur 06/13/2021   Acute CVA (cerebrovascular accident) (HCC) 03/07/2021   CVA (cerebral vascular accident) (HCC) 11/01/2020   Chronic pain  04/25/2020   Chronic constipation 04/25/2020   Gastro-esophageal reflux disease without esophagitis 04/25/2020   Hardening of the aorta (main artery of the heart) (HCC) 04/25/2020   History of iron deficiency anemia 04/25/2020   Hypercholesteremia 04/25/2020   Hypertension 04/25/2020   Personal history of transient ischemic attack (TIA), and cerebral infarction without residual deficits 04/25/2020   Pulmonary emphysema (HCC) 04/25/2020   Vitamin D deficiency 04/25/2020   Tremor, essential 04/24/2020   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   Right lower quadrant abdominal pain 08/18/2018   Spastic hemiplegia affecting nondominant side (HCC) 02/03/2014   Diabetes mellitus (HCC) 09/07/2013   Hypothyroidism 09/07/2013   Type 2 diabetes mellitus without complications (HCC) 09/07/2013   Hemiparesis and alteration of sensations as late effects of stroke (HCC) 01/26/2013   Unsteady gait 01/26/2013   Dizziness and giddiness 01/26/2013    ONSET DATE: 2009  REFERRING DIAG: Z86.73 (ICD-10-CM) - History of stroke   THERAPY DIAG:  Muscle weakness (generalized)  Difficulty in walking, not elsewhere classified  Unsteadiness on feet  Abnormality of gait and mobility  Other lack of coordination  Other abnormalities of gait and mobility  Rationale for Evaluation and Treatment: Rehabilitation  SUBJECTIVE:  SUBJECTIVE STATEMENT:  Patient reports doing well today in general.  No medical updates.  Patient with questions on house access and set up to reduce fall risk.  Reports that she will be getting new AFO on 6/27. Is concerned that is will effect her gait pattern.      Pt accompanied by: self  PERTINENT HISTORY: Pt has had one major stroke and 5 mini strokes since 2009. Pt is here since her  doctor wants her to work on her strength and balancea nd she also wants to improve in these categories. Pt has hemianopsia with blindness inferior, left side and superior. Pt initially had stroke in 2009 and was previously able to walk for long distances. Pt has 650 feet driveway that is gravel and used to walk up and down with the cane. Pt has issues with depth perception. Pt is unable to tell angles of concrete and holes in the ground. Pt has service dogs that are retired, one for guidance and one for helping with picking up and retrieving items. Pt had most recent stroke in January 2023 that further limited her field of vision.  Patient wants to work diligently on her strength, balance, mobility, and in order to improve her ability to ambulate on various surfaces in various scenarios.  Patient wanted to come to outpatient services to utilize equipment to improve her strength and a greater capacity than home health was able to assist her with.  She is doing home exercise program involving mostly standing with upper extremity support and activities to improve her strength.  Patient reports she is very nervous to ambulate without contact-guard assist and gait belt without being able to hold onto a wall or steady surface in order to provide her with tactile cues for the areas around her.  PAIN:  Are you having pain? None  PRECAUTIONS: Fall, hemianopsia  WEIGHT BEARING RESTRICTIONS: No  FALLS: Has patient fallen in last 6 months? No  PATIENT GOALS: Improve gait, improve strength, walk on unsteady surfaces  OBJECTIVE:   TODAY'S TREATMENT:                                                                                                                              DATE: 07/31/22  Sit<>stand from [facility] with supervision assist throughout session.   Gait with loftstrand x 28ft and supervision assist from PT.   Gait with rollator. 2 X 65ft with supervision assist from PT  .   Patient continues to  demonstrate improved pelvic mobility hip and knee flexion and straight line navigation with rollator compared to Loftstrand crutch.  But patient notes he is feeling safer with Loftstrand compared to rollator and notes that the crutch and fit in spaces that the rollator will not fit.   Crept up step down 6 inch step with upper extremity support on the rail at wall.  Performed 2 x 6 with contact-guard supervision assist from PT.  PT provided education for  door modifications to add handle close to door hinge to allow patient to close door with bilateral lower extremity on porch outside of door. [place]um management training with 1 upper extremity support 2 x 4.  Patient demonstrating step to gait pattern ascending and descending with the right lower extremity due to sensory ataxia on the left lower extremity.    PATIENT EDUCATION: Education details: discussed supination tendencies mentioned regarding LLE in context of ABDCT limb in gait/stance Person educated: Patient Education method: Explanation Education comprehension: verbalized understanding  HOME EXERCISE PROGRAM:   Access Code: [email or web address] URL: [email or web address] Date: 07/31/2022 Prepared by: Grier Rocher  Exercises - Standing Tandem Balance with Counter Support  - 1 x daily - 3 x weekly - 2 sets - 2 reps - 10 hold - Standing Single Leg Stance with Counter Support  - 1 x daily - 3 x weekly - 2 sets - 2 reps - 5 hold  Updated 05/29/2022: Access Code: [email or web address] URL: [email or web address] Date: 05/29/2022 Prepared by: Temple Pacini  Exercises - Seated March  - 1 x daily - 5 x weekly - 3 sets - 10 reps - Seated Long Arc Quad  - 1 x daily - 7 x weekly - 3 sets - 10 reps -written on handout: seated core twists with holding YTB or RTB 5x weekly 3x10 each side  Previous: Pt performs leg lifts and squats with UE support    GOALS: Goals reviewed with patient? Yes  SHORT TERM GOALS: Target date:  06/24/2022    Patient will be independent in home exercise program to improve strength/mobility for better functional independence with ADLs. Baseline: No HEP currently  Goal status: INITIAL  LONG TERM GOALS: Target date: 08/19/2022  1.  Patient (> 72 years old) will complete five times sit to stand test in < 15 seconds indicating an increased LE strength and improved balance. Baseline:  Test second visit ; 05/29/2022: 40 sec pull to stance RUE 07/10/22: 22.82s Goal status: ONGOING  2.  Patient will increase FOTO score by 8 or more points  to demonstrate statistically significant improvement in mobility and quality of life.  Baseline: Test second visit; 05/28/2040: 45; 07/10/22: 41 Goal status: INITIAL  3.  Patient will increase 10 meter walk test to >.66m/s as to improve gait speed for better community ambulation and to reduce fall risk. Baseline: Test visit 2 06/19/22: 53 sec 5/16: 77 sec Goal status: ONGOING   4. Patient will improve ability to ambulate on uneven surfaces as evidenced by ability to ambulate over simulated uneven terrain in clinic prior to improve independence with ambulation. Baseline: Patient not ambulating on uneven terrain Goal status: INITIAL   ASSESSMENT:  CLINICAL IMPRESSION: Pt continues to progress as able, she is heavily motivated to improve her function. I continues to demonstrate improved gait pattern with rollator compared to use of Loftstrand crutch, but patient notes that she is limited by the size of rollator in tight environment of home and for transport to and from doctors and community.  Therapy session focused on improved safety with step management to simulate home environment.  Education provided for doorway adjustment to allow patient to close door with bilateral lower extremity on porch.  Pt will continue to benefit from skilled physical therapy intervention to address impairments, improve QOL, and achieve therapy goals.   OBJECTIVE IMPAIRMENTS:  Abnormal gait, decreased activity tolerance, decreased balance, decreased endurance, decreased strength, decreased safety awareness, hypomobility, impaired flexibility, and impaired UE functional use.   ACTIVITY LIMITATIONS: lifting,  bending, standing, stairs, transfers, bed mobility, and locomotion level  PARTICIPATION LIMITATIONS: meal prep, cleaning, shopping, and community activity  PERSONAL FACTORS: Time since onset of injury/illness/exacerbation and 3+ comorbidities: diabetes, HTN, Multiple previous strokes   are also affecting patient's functional outcome.   REHAB POTENTIAL: Fair secondary to time since onset and severely of visual deficits and L UE funcitonal use   CLINICAL DECISION MAKING: Stable/uncomplicated  EVALUATION COMPLEXITY: Low  PLAN:  PT FREQUENCY: 2x/week  PT DURATION: 12 weeks  PLANNED INTERVENTIONS: Therapeutic exercises, Therapeutic activity, Neuromuscular re-education, Balance training, Gait training, Patient/Family education, Self Care, Joint mobilization, Stair training, Moist heat, Manual therapy, and Re-evaluation  PLAN FOR NEXT SESSION:   Continue with progressive LE strengthening,  ambulation training as indicated.  Dual task with gait training.   Grier Rocher PT, DPT  Physical Therapist - [place]  [facility]  5:08 PM 07/31/22   Note: Portions of this document were prepared using Dragon voice recognition software and although reviewed may contain unintentional dictation errors in syntax, grammar, or spelling.

## 2022-08-02 DIAGNOSIS — E1142 Type 2 diabetes mellitus with diabetic polyneuropathy: Secondary | ICD-10-CM | POA: Diagnosis not present

## 2022-08-04 ENCOUNTER — Ambulatory Visit: Payer: PPO | Admitting: Physical Therapy

## 2022-08-04 DIAGNOSIS — R262 Difficulty in walking, not elsewhere classified: Secondary | ICD-10-CM

## 2022-08-04 DIAGNOSIS — M6281 Muscle weakness (generalized): Secondary | ICD-10-CM

## 2022-08-04 DIAGNOSIS — R2681 Unsteadiness on feet: Secondary | ICD-10-CM

## 2022-08-04 DIAGNOSIS — R269 Unspecified abnormalities of gait and mobility: Secondary | ICD-10-CM

## 2022-08-04 DIAGNOSIS — R2689 Other abnormalities of gait and mobility: Secondary | ICD-10-CM

## 2022-08-04 NOTE — Therapy (Signed)
OUTPATIENT PHYSICAL THERAPY NEURO TREATMENT    Patient Name: [name] MRN: [id number] DOB:Sep 12, 1950, 72 y.o., female Today's Date: 08/04/2022   PCP:   Enid Baas, MD   REFERRING PROVIDER: Morene Crocker, MD   END OF SESSION:  PT End of Session - 08/04/22 1304     Visit Number 17    Number of Visits 24    Date for PT Re-Evaluation 08/19/22    Authorization Type [employer]    Progress Note Due on Visit 20    PT Start Time 1302    PT Stop Time 1344    PT Time Calculation (min) 42 min    Equipment Utilized During Treatment Gait belt    Activity Tolerance Patient tolerated treatment well;No increased pain    Behavior During Therapy [facility] for tasks assessed/performed                Past Medical History:  Diagnosis Date   Abdominal pain 12/06/2013   Abnormality of gait 01/26/2013   Altered bowel habits 12/11/2017   Bruises easily    Constipation    Crohn's disease (HCC)    Depression    Dizziness and giddiness 01/26/2013   Fecal incontinence alternating with constipation 12/11/2017   GERD (gastroesophageal reflux disease)    Hemianopia    left   Hemiparesis and alteration of sensations as late effects of stroke (HCC) 01/26/2013   Hyperlipidemia    Hypertension    Hypothyroidism    Lump in female breast    Panic disorder    PONV (postoperative nausea and vomiting)    Stroke (HCC) 2009   Tremor, essential 04/24/2020   Type II diabetes mellitus (HCC)    Past Surgical History:  Procedure Laterality Date   ANAL FISSURE REPAIR  2008   APPENDECTOMY  1986   BREAST BIOPSY Left 2006   neg   BREAST LUMPECTOMY Right 1990's   CESAREAN SECTION  1981; 1983   CHOLECYSTECTOMY  1986   SMALL INTESTINE SURGERY  05/1991   TUBAL LIGATION  1983   Patient Active Problem List   Diagnosis Date Noted   Bone spur 06/13/2021   Acute CVA (cerebrovascular accident) (HCC) 03/07/2021   CVA (cerebral vascular accident) (HCC) 11/01/2020   Chronic pain  04/25/2020   Chronic constipation 04/25/2020   Gastro-esophageal reflux disease without esophagitis 04/25/2020   Hardening of the aorta (main artery of the heart) (HCC) 04/25/2020   History of iron deficiency anemia 04/25/2020   Hypercholesteremia 04/25/2020   Hypertension 04/25/2020   Personal history of transient ischemic attack (TIA), and cerebral infarction without residual deficits 04/25/2020   Pulmonary emphysema (HCC) 04/25/2020   Vitamin D deficiency 04/25/2020   Tremor, essential 04/24/2020   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   Right lower quadrant abdominal pain 08/18/2018   Spastic hemiplegia affecting nondominant side (HCC) 02/03/2014   Diabetes mellitus (HCC) 09/07/2013   Hypothyroidism 09/07/2013   Type 2 diabetes mellitus without complications (HCC) 09/07/2013   Hemiparesis and alteration of sensations as late effects of stroke (HCC) 01/26/2013   Unsteady gait 01/26/2013   Dizziness and giddiness 01/26/2013    ONSET DATE: 2009  REFERRING DIAG: Z86.73 (ICD-10-CM) - History of stroke   THERAPY DIAG:  Muscle weakness (generalized)  Difficulty in walking, not elsewhere classified  Unsteadiness on feet  Abnormality of gait and mobility  Other abnormalities of gait and mobility  Rationale for Evaluation and Treatment: Rehabilitation  SUBJECTIVE:  SUBJECTIVE STATEMENT:   Patient reports doing well today in general. No medical updates.  Patient with questions on house access and set up to reduce fall risk.  Reports that she will be getting new AFO on 6/27. Is concerned that is will effect her gait pattern.      Pt accompanied by: self  PERTINENT HISTORY: Pt has had one major stroke and 5 mini strokes since 2009. Pt is here since her doctor wants her to work on her  strength and balancea nd she also wants to improve in these categories. Pt has hemianopsia with blindness inferior, left side and superior. Pt initially had stroke in 2009 and was previously able to walk for long distances. Pt has 650 feet driveway that is gravel and used to walk up and down with the cane. Pt has issues with depth perception. Pt is unable to tell angles of concrete and holes in the ground. Pt has service dogs that are retired, one for guidance and one for helping with picking up and retrieving items. Pt had most recent stroke in January 2023 that further limited her field of vision.  Patient wants to work diligently on her strength, balance, mobility, and in order to improve her ability to ambulate on various surfaces in various scenarios.  Patient wanted to come to outpatient services to utilize equipment to improve her strength and a greater capacity than home health was able to assist her with.  She is doing home exercise program involving mostly standing with upper extremity support and activities to improve her strength.  Patient reports she is very nervous to ambulate without contact-guard assist and gait belt without being able to hold onto a wall or steady surface in order to provide her with tactile cues for the areas around her.  PAIN:  Are you having pain? None  PRECAUTIONS: Fall, hemianopsia  WEIGHT BEARING RESTRICTIONS: No  FALLS: Has patient fallen in last 6 months? No  PATIENT GOALS: Improve gait, improve strength, walk on unsteady surfaces  OBJECTIVE:   TODAY'S TREATMENT:                                                                                                                              DATE: 08/04/22 TA  Sit<>stand from [facility] with supervision assist throughout session.   Gait with rollator x 5ft and supervision assist from PT.   Gait with 3 Wheel rollator.  X 66ft with supervision assist from PT  .   Patient continues to demonstrate improved pelvic  mobility hip and knee flexion and straight line navigation with rollator compared to Loftstrand crutch.  But patient notes he is feeling safer with Loftstrand compared to rollator and notes that the crutch and fit in spaces that the rollator will not fit.  Stair training functionally for getting in / out of house x 10 to ea side side step up(6 inch) followed by ipsilateral reach to cone held by SPT then handing  to PT on opposite side combined with step down from step   Step up onto step then step down x 8 reps   Transfer to [facility] for end of session.    PATIENT EDUCATION: Education details: discussed supination tendencies mentioned regarding LLE in context of ABDCT limb in gait/stance Person educated: Patient Education method: Explanation Education comprehension: verbalized understanding  HOME EXERCISE PROGRAM:   Access Code: [email or web address] URL: [email or web address] Date: 07/31/2022 Prepared by: Grier Rocher  Exercises - Standing Tandem Balance with Counter Support  - 1 x daily - 3 x weekly - 2 sets - 2 reps - 10 hold - Standing Single Leg Stance with Counter Support  - 1 x daily - 3 x weekly - 2 sets - 2 reps - 5 hold  Updated 05/29/2022: Access Code: [email or web address] URL: [email or web address] Date: 05/29/2022 Prepared by: Temple Pacini  Exercises - Seated March  - 1 x daily - 5 x weekly - 3 sets - 10 reps - Seated Long Arc Quad  - 1 x daily - 7 x weekly - 3 sets - 10 reps -written on handout: seated core twists with holding YTB or RTB 5x weekly 3x10 each side  Previous: Pt performs leg lifts and squats with UE support    GOALS: Goals reviewed with patient? Yes  SHORT TERM GOALS: Target date: 06/24/2022    Patient will be independent in home exercise program to improve strength/mobility for better functional independence with ADLs. Baseline: No HEP currently  Goal status: INITIAL  LONG TERM GOALS: Target date: 08/19/2022  1.  Patient (> 65 years old) will  complete five times sit to stand test in < 15 seconds indicating an increased LE strength and improved balance. Baseline:  Test second visit ; 05/29/2022: 40 sec pull to stance RUE 07/10/22: 22.82s Goal status: ONGOING  2.  Patient will increase FOTO score by 8 or more points  to demonstrate statistically significant improvement in mobility and quality of life.  Baseline: Test second visit; 05/28/2040: 45; 07/10/22: 41 Goal status: INITIAL  3.  Patient will increase 10 meter walk test to >.43m/s as to improve gait speed for better community ambulation and to reduce fall risk. Baseline: Test visit 2 06/19/22: 53 sec 5/16: 77 sec Goal status: ONGOING   4. Patient will improve ability to ambulate on uneven surfaces as evidenced by ability to ambulate over simulated uneven terrain in clinic prior to improve independence with ambulation. Baseline: Patient not ambulating on uneven terrain Goal status: INITIAL   ASSESSMENT:  CLINICAL IMPRESSION: Pt continues to progress as able, she is heavily motivated to improve her function. Pt continues to demonstrate improved gait pattern with rollator compared to use of Loftstrand crutch, but patient notes that she is limited by the size of rollator in tight environment of home and for transport to and from doctors and community. Pt trialed 3 wheel rolator and demonstrated good safety, control and maneuverability with it.  Pt will continue to benefit from skilled physical therapy intervention to address impairments, improve QOL, and achieve therapy goals.   OBJECTIVE IMPAIRMENTS: Abnormal gait, decreased activity tolerance, decreased balance, decreased endurance, decreased strength, decreased safety awareness, hypomobility, impaired flexibility, and impaired UE functional use.   ACTIVITY LIMITATIONS: lifting, bending, standing, stairs, transfers, bed mobility, and locomotion level  PARTICIPATION LIMITATIONS: meal prep, cleaning, shopping, and community  activity  PERSONAL FACTORS: Time since onset of injury/illness/exacerbation and 3+ comorbidities: diabetes, HTN, Multiple previous strokes   are also affecting patient's functional outcome.   REHAB  POTENTIAL: Fair secondary to time since onset and severely of visual deficits and L UE funcitonal use   CLINICAL DECISION MAKING: Stable/uncomplicated  EVALUATION COMPLEXITY: Low  PLAN:  PT FREQUENCY: 2x/week  PT DURATION: 12 weeks  PLANNED INTERVENTIONS: Therapeutic exercises, Therapeutic activity, Neuromuscular re-education, Balance training, Gait training, Patient/Family education, Self Care, Joint mobilization, Stair training, Moist heat, Manual therapy, and Re-evaluation  PLAN FOR NEXT SESSION:   Continue with progressive LE strengthening,  ambulation training as indicated.  Dual task with gait training.   Norman Herrlich PT ,DPT Physical Therapist- [place]  [facility]  1:07 PM 08/04/22   Note: Portions of this document were prepared using Dragon voice recognition software and although reviewed may contain unintentional dictation errors in syntax, grammar, or spelling.

## 2022-08-06 ENCOUNTER — Ambulatory Visit: Payer: PPO

## 2022-08-06 ENCOUNTER — Other Ambulatory Visit: Payer: Self-pay

## 2022-08-06 ENCOUNTER — Other Ambulatory Visit: Payer: Self-pay | Admitting: Gastroenterology

## 2022-08-06 ENCOUNTER — Ambulatory Visit: Payer: PPO | Admitting: Physical Therapy

## 2022-08-06 MED ORDER — LINACLOTIDE 145 MCG PO CAPS
145.0000 ug | ORAL_CAPSULE | Freq: Every day | ORAL | 11 refills | Status: DC
  Filled 2022-08-06: qty 16, 16d supply, fill #0
  Filled 2022-08-20 – 2022-10-01 (×2): qty 30, 30d supply, fill #0

## 2022-08-07 ENCOUNTER — Encounter: Payer: Self-pay | Admitting: Physical Therapy

## 2022-08-07 ENCOUNTER — Other Ambulatory Visit: Payer: Self-pay

## 2022-08-07 ENCOUNTER — Ambulatory Visit: Payer: PPO | Admitting: Physical Therapy

## 2022-08-07 DIAGNOSIS — R2689 Other abnormalities of gait and mobility: Secondary | ICD-10-CM

## 2022-08-07 DIAGNOSIS — M6281 Muscle weakness (generalized): Secondary | ICD-10-CM | POA: Diagnosis not present

## 2022-08-07 DIAGNOSIS — R269 Unspecified abnormalities of gait and mobility: Secondary | ICD-10-CM

## 2022-08-07 DIAGNOSIS — R2681 Unsteadiness on feet: Secondary | ICD-10-CM

## 2022-08-07 DIAGNOSIS — R262 Difficulty in walking, not elsewhere classified: Secondary | ICD-10-CM

## 2022-08-07 NOTE — Therapy (Signed)
OUTPATIENT PHYSICAL THERAPY NEURO TREATMENT    Patient Name: [name] MRN: [id number] DOB:[birth year], 72 y.o., female Today's Date: 08/07/2022   PCP:   Enid Baas, MD   REFERRING PROVIDER: Morene Crocker, MD   END OF SESSION:  PT End of Session - 08/07/22 1420     Visit Number 18    Number of Visits 24    Date for PT Re-Evaluation 08/19/22    Authorization Type [employer]    Progress Note Due on Visit 20    PT Start Time 1430    PT Stop Time 1513    PT Time Calculation (min) 43 min    Equipment Utilized During Treatment Gait belt    Activity Tolerance Patient tolerated treatment well;No increased pain    Behavior During Therapy [facility] for tasks assessed/performed                 Past Medical History:  Diagnosis Date   Abdominal pain 12/06/2013   Abnormality of gait 01/26/2013   Altered bowel habits 12/11/2017   Bruises easily    Constipation    Crohn's disease (HCC)    Depression    Dizziness and giddiness 01/26/2013   Fecal incontinence alternating with constipation 12/11/2017   GERD (gastroesophageal reflux disease)    Hemianopia    left   Hemiparesis and alteration of sensations as late effects of stroke (HCC) 01/26/2013   Hyperlipidemia    Hypertension    Hypothyroidism    Lump in female breast    Panic disorder    PONV (postoperative nausea and vomiting)    Stroke (HCC) 2009   Tremor, essential 04/24/2020   Type II diabetes mellitus (HCC)    Past Surgical History:  Procedure Laterality Date   ANAL FISSURE REPAIR  2008   APPENDECTOMY  1986   BREAST BIOPSY Left 2006   neg   BREAST LUMPECTOMY Right 1990's   CESAREAN SECTION  1981; 1983   CHOLECYSTECTOMY  1986   SMALL INTESTINE SURGERY  05/1991   TUBAL LIGATION  1983   Patient Active Problem List   Diagnosis Date Noted   Bone spur 06/13/2021   Acute CVA (cerebrovascular accident) (HCC) 03/07/2021   CVA (cerebral vascular accident) (HCC) 11/01/2020   Chronic pain  04/25/2020   Chronic constipation 04/25/2020   Gastro-esophageal reflux disease without esophagitis 04/25/2020   Hardening of the aorta (main artery of the heart) (HCC) 04/25/2020   History of iron deficiency anemia 04/25/2020   Hypercholesteremia 04/25/2020   Hypertension 04/25/2020   Personal history of transient ischemic attack (TIA), and cerebral infarction without residual deficits 04/25/2020   Pulmonary emphysema (HCC) 04/25/2020   Vitamin D deficiency 04/25/2020   Tremor, essential 04/24/2020   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   Right lower quadrant abdominal pain 08/18/2018   Spastic hemiplegia affecting nondominant side (HCC) 02/03/2014   Diabetes mellitus (HCC) 09/07/2013   Hypothyroidism 09/07/2013   Type 2 diabetes mellitus without complications (HCC) 09/07/2013   Hemiparesis and alteration of sensations as late effects of stroke (HCC) 01/26/2013   Unsteady gait 01/26/2013   Dizziness and giddiness 01/26/2013    ONSET DATE: 2009  REFERRING DIAG: Z86.73 (ICD-10-CM) - History of stroke   THERAPY DIAG:  Muscle weakness (generalized)  Difficulty in walking, not elsewhere classified  Unsteadiness on feet  Abnormality of gait and mobility  Other abnormalities of gait and mobility  Rationale for Evaluation and Treatment: Rehabilitation  SUBJECTIVE:  SUBJECTIVE STATEMENT:   Pt reports she is doing well and ordered a 3 Wheel rolator. No falls or Lob since last visit.      Pt accompanied by: self  PERTINENT HISTORY: Pt has had one major stroke and 5 mini strokes since 2009. Pt is here since her doctor wants her to work on her strength and balancea nd she also wants to improve in these categories. Pt has hemianopsia with blindness inferior, left side and superior. Pt  initially had stroke in 2009 and was previously able to walk for long distances. Pt has 650 feet driveway that is gravel and used to walk up and down with the cane. Pt has issues with depth perception. Pt is unable to tell angles of concrete and holes in the ground. Pt has service dogs that are retired, one for guidance and one for helping with picking up and retrieving items. Pt had most recent stroke in January 2023 that further limited her field of vision.  Patient wants to work diligently on her strength, balance, mobility, and in order to improve her ability to ambulate on various surfaces in various scenarios.  Patient wanted to come to outpatient services to utilize equipment to improve her strength and a greater capacity than home health was able to assist her with.  She is doing home exercise program involving mostly standing with upper extremity support and activities to improve her strength.  Patient reports she is very nervous to ambulate without contact-guard assist and gait belt without being able to hold onto a wall or steady surface in order to provide her with tactile cues for the areas around her.  PAIN:  Are you having pain? None  PRECAUTIONS: Fall, hemianopsia  WEIGHT BEARING RESTRICTIONS: No  FALLS: Has patient fallen in last 6 months? No  PATIENT GOALS: Improve gait, improve strength, walk on unsteady surfaces  OBJECTIVE:   TODAY'S TREATMENT:                                                                                                                              DATE: 08/07/22  TA  Sit<>stand from [facility] with supervision assist throughout session.   Gait with rollator x 130ft and supervision assist from PT.  L LE equinus gait throughout   Stair navigation training ( step up and over step x 10  with 4 in step)  Gait with rollator.  X 159ft with supervision assist from PT    Patient continues to demonstrate improved pelvic mobility hip and knee flexion and straight line  navigation with rollator compared to Loftstrand crutch.  But patient notes he is feeling safer with Loftstrand compared to rollator and notes that the crutch and fit in spaces that the rollator will not fit.  Stair training functionally for getting in / out of house x 10 to ea side side step up (6 inch) followed by ipsilateral reach to cone held by SPT then handing to PT on  opposite side combined with step down from step   Gait with rollator.  X 100 ft with supervision assist from PT    Patient continues to demonstrate improved pelvic mobility hip and knee flexion and straight line navigation with rollator compared to Loftstrand crutch.  But patient notes he is feeling safer with Loftstrand compared to rollator and notes that the crutch and fit in spaces that the rollator will not fit.  Transfer to [facility] for end of session.    PATIENT EDUCATION: Education details: discussed supination tendencies mentioned regarding LLE in context of ABDCT limb in gait/stance Person educated: Patient Education method: Explanation Education comprehension: verbalized understanding  HOME EXERCISE PROGRAM:   Access Code: [email or web address] URL: [email or web address] Date: 07/31/2022 Prepared by: Grier Rocher  Exercises - Standing Tandem Balance with Counter Support  - 1 x daily - 3 x weekly - 2 sets - 2 reps - 10 hold - Standing Single Leg Stance with Counter Support  - 1 x daily - 3 x weekly - 2 sets - 2 reps - 5 hold  Updated 05/29/2022: Access Code: [email or web address] URL: [email or web address] Date: 05/29/2022 Prepared by: Temple Pacini  Exercises - Seated March  - 1 x daily - 5 x weekly - 3 sets - 10 reps - Seated Long Arc Quad  - 1 x daily - 7 x weekly - 3 sets - 10 reps -written on handout: seated core twists with holding YTB or RTB 5x weekly 3x10 each side  Previous: Pt performs leg lifts and squats with UE support    GOALS: Goals reviewed with patient? Yes  SHORT TERM GOALS: Target  date: 06/24/2022    Patient will be independent in home exercise program to improve strength/mobility for better functional independence with ADLs. Baseline: No HEP currently  Goal status: INITIAL  LONG TERM GOALS: Target date: 08/19/2022  1.  Patient (> 105 years old) will complete five times sit to stand test in < 15 seconds indicating an increased LE strength and improved balance. Baseline:  Test second visit ; 05/29/2022: 40 sec pull to stance RUE 07/10/22: 22.82s Goal status: ONGOING  2.  Patient will increase FOTO score by 8 or more points  to demonstrate statistically significant improvement in mobility and quality of life.  Baseline: Test second visit; 05/28/2040: 45; 07/10/22: 41 Goal status: INITIAL  3.  Patient will increase 10 meter walk test to >.2m/s as to improve gait speed for better community ambulation and to reduce fall risk. Baseline: Test visit 2 06/19/22: 53 sec 5/16: 77 sec Goal status: ONGOING   4. Patient will improve ability to ambulate on uneven surfaces as evidenced by ability to ambulate over simulated uneven terrain in clinic to improve independence with ambulation. Baseline: Patient not ambulating on uneven terrain Goal status: INITIAL   ASSESSMENT:  CLINICAL IMPRESSION: Pt continues to progress as able, she is heavily motivated to improve her function. Pt continues to demonstrate improved gait pattern with rollator compared to use of Loftstrand crutch, but patient notes that she is limited by the size of rollator in tight environment of home and for transport to and from doctors and community. Pt ordered a new rollator with 3 Wheels and is eager to have this for working on her mobility.   Pt will continue to benefit from skilled physical therapy intervention to address impairments, improve QOL, and achieve therapy goals.   OBJECTIVE IMPAIRMENTS: Abnormal gait, decreased activity tolerance, decreased balance, decreased endurance, decreased strength, decreased  safety awareness, hypomobility, impaired flexibility, and  impaired UE functional use.   ACTIVITY LIMITATIONS: lifting, bending, standing, stairs, transfers, bed mobility, and locomotion level  PARTICIPATION LIMITATIONS: meal prep, cleaning, shopping, and community activity  PERSONAL FACTORS: Time since onset of injury/illness/exacerbation and 3+ comorbidities: diabetes, HTN, Multiple previous strokes   are also affecting patient's functional outcome.   REHAB POTENTIAL: Fair secondary to time since onset and severely of visual deficits and L UE funcitonal use   CLINICAL DECISION MAKING: Stable/uncomplicated  EVALUATION COMPLEXITY: Low  PLAN:  PT FREQUENCY: 2x/week  PT DURATION: 12 weeks  PLANNED INTERVENTIONS: Therapeutic exercises, Therapeutic activity, Neuromuscular re-education, Balance training, Gait training, Patient/Family education, Self Care, Joint mobilization, Stair training, Moist heat, Manual therapy, and Re-evaluation  PLAN FOR NEXT SESSION:   Continue with progressive LE strengthening,  ambulation training as indicated.  Dual task with gait training.   Norman Herrlich PT ,DPT Physical Therapist- [place]  [facility]  2:21 PM 08/07/22   Note: Portions of this document were prepared using Dragon voice recognition software and although reviewed may contain unintentional dictation errors in syntax, grammar, or spelling.

## 2022-08-11 ENCOUNTER — Ambulatory Visit: Payer: PPO | Admitting: Physical Therapy

## 2022-08-12 ENCOUNTER — Other Ambulatory Visit: Payer: Self-pay

## 2022-08-12 ENCOUNTER — Ambulatory Visit: Payer: PPO

## 2022-08-13 ENCOUNTER — Ambulatory Visit: Payer: PPO | Admitting: Physical Therapy

## 2022-08-13 ENCOUNTER — Ambulatory Visit: Payer: PPO

## 2022-08-13 ENCOUNTER — Encounter: Payer: Self-pay | Admitting: Physical Therapy

## 2022-08-13 DIAGNOSIS — R2681 Unsteadiness on feet: Secondary | ICD-10-CM

## 2022-08-13 DIAGNOSIS — M6281 Muscle weakness (generalized): Secondary | ICD-10-CM | POA: Diagnosis not present

## 2022-08-13 DIAGNOSIS — R2689 Other abnormalities of gait and mobility: Secondary | ICD-10-CM

## 2022-08-13 DIAGNOSIS — R269 Unspecified abnormalities of gait and mobility: Secondary | ICD-10-CM

## 2022-08-13 DIAGNOSIS — R262 Difficulty in walking, not elsewhere classified: Secondary | ICD-10-CM

## 2022-08-13 NOTE — Therapy (Unsigned)
OUTPATIENT PHYSICAL THERAPY NEURO TREATMENT    Patient Name: [name] MRN: [id number] DOB:04-Jun-1950, 72 y.o., female Today's Date: 08/13/2022   PCP:   Enid Baas, MD   REFERRING PROVIDER: Morene Crocker, MD   END OF SESSION:  PT End of Session - 08/13/22 1345     Visit Number 19    Number of Visits 24    Date for PT Re-Evaluation 08/19/22    Authorization Type [employer]    Progress Note Due on Visit 20    PT Start Time 1346    PT Stop Time 1430    PT Time Calculation (min) 44 min    Equipment Utilized During Treatment Gait belt    Activity Tolerance Patient tolerated treatment well;No increased pain    Behavior During Therapy [facility] for tasks assessed/performed                  Past Medical History:  Diagnosis Date   Abdominal pain 12/06/2013   Abnormality of gait 01/26/2013   Altered bowel habits 12/11/2017   Bruises easily    Constipation    Crohn's disease (HCC)    Depression    Dizziness and giddiness 01/26/2013   Fecal incontinence alternating with constipation 12/11/2017   GERD (gastroesophageal reflux disease)    Hemianopia    left   Hemiparesis and alteration of sensations as late effects of stroke (HCC) 01/26/2013   Hyperlipidemia    Hypertension    Hypothyroidism    Lump in female breast    Panic disorder    PONV (postoperative nausea and vomiting)    Stroke (HCC) 2009   Tremor, essential 04/24/2020   Type II diabetes mellitus (HCC)    Past Surgical History:  Procedure Laterality Date   ANAL FISSURE REPAIR  2008   APPENDECTOMY  1986   BREAST BIOPSY Left 2006   neg   BREAST LUMPECTOMY Right 1990's   CESAREAN SECTION  1981; 1983   CHOLECYSTECTOMY  1986   SMALL INTESTINE SURGERY  05/1991   TUBAL LIGATION  1983   Patient Active Problem List   Diagnosis Date Noted   Bone spur 06/13/2021   Acute CVA (cerebrovascular accident) (HCC) 03/07/2021   CVA (cerebral vascular accident) (HCC) 11/01/2020   Chronic pain  04/25/2020   Chronic constipation 04/25/2020   Gastro-esophageal reflux disease without esophagitis 04/25/2020   Hardening of the aorta (main artery of the heart) (HCC) 04/25/2020   History of iron deficiency anemia 04/25/2020   Hypercholesteremia 04/25/2020   Hypertension 04/25/2020   Personal history of transient ischemic attack (TIA), and cerebral infarction without residual deficits 04/25/2020   Pulmonary emphysema (HCC) 04/25/2020   Vitamin D deficiency 04/25/2020   Tremor, essential 04/24/2020   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   Right lower quadrant abdominal pain 08/18/2018   Spastic hemiplegia affecting nondominant side (HCC) 02/03/2014   Diabetes mellitus (HCC) 09/07/2013   Hypothyroidism 09/07/2013   Type 2 diabetes mellitus without complications (HCC) 09/07/2013   Hemiparesis and alteration of sensations as late effects of stroke (HCC) 01/26/2013   Unsteady gait 01/26/2013   Dizziness and giddiness 01/26/2013    ONSET DATE: 2009  REFERRING DIAG: Z86.73 (ICD-10-CM) - History of stroke   THERAPY DIAG:  Muscle weakness (generalized)  Difficulty in walking, not elsewhere classified  Unsteadiness on feet  Abnormality of gait and mobility  Other abnormalities of gait and mobility  Rationale for Evaluation and Treatment: Rehabilitation  SUBJECTIVE:  SUBJECTIVE STATEMENT:   Pt reports she is doing well and arrives with her new 3 wheel walker today. No falls or Lob since last visit.    Pt accompanied by: self  PERTINENT HISTORY: Pt has had one major stroke and 5 mini strokes since 2009. Pt is here since her doctor wants her to work on her strength and balancea nd she also wants to improve in these categories. Pt has hemianopsia with blindness inferior, left side and  superior. Pt initially had stroke in 2009 and was previously able to walk for long distances. Pt has 650 feet driveway that is gravel and used to walk up and down with the cane. Pt has issues with depth perception. Pt is unable to tell angles of concrete and holes in the ground. Pt has service dogs that are retired, one for guidance and one for helping with picking up and retrieving items. Pt had most recent stroke in January 2023 that further limited her field of vision.  Patient wants to work diligently on her strength, balance, mobility, and in order to improve her ability to ambulate on various surfaces in various scenarios.  Patient wanted to come to outpatient services to utilize equipment to improve her strength and a greater capacity than home health was able to assist her with.  She is doing home exercise program involving mostly standing with upper extremity support and activities to improve her strength.  Patient reports she is very nervous to ambulate without contact-guard assist and gait belt without being able to hold onto a wall or steady surface in order to provide her with tactile cues for the areas around her.  PAIN:  Are you having pain? None  PRECAUTIONS: Fall, hemianopsia  WEIGHT BEARING RESTRICTIONS: No  FALLS: Has patient fallen in last 6 months? No  PATIENT GOALS: Improve gait, improve strength, walk on unsteady surfaces  OBJECTIVE:   TODAY'S TREATMENT:                                                                                                                              DATE: 08/13/22  TA  Sit<>stand from [facility] with CGA throughout session.   Gait with 3-wheel rollator x 162ft and CGA from SPT. Patient adapting to new rollator, noting R side of rollator getting caught on things that pt usually does not have issues with.  Stair training functionally for getting in / out of house x 10 to  R foot on step up (6 inch) followed by ipsilateral cross reach to cone on  variable height cart, handing back to SPT on opposite side, posterior-laterally.  Gait with 3-wheel rollator x 158ft and CGA from SPT. Same route as before pt improved with ability to navigate evidenced by 3-wheel rollator not getting caught on anything.    Gait with 3-wheel rollator x 79ft and CGA from SPT to finish off session. Pt noting LLE was feeling less stiff with more ambulation today.  Transfer  to [facility] for end of session.    PATIENT EDUCATION: Education details: discussed supination tendencies mentioned regarding LLE in context of ABDCT limb in gait/stance Person educated: Patient Education method: Explanation Education comprehension: verbalized understanding  HOME EXERCISE PROGRAM:   Access Code: [email or web address] URL: [email or web address] Date: 07/31/2022 Prepared by: Grier Rocher  Exercises - Standing Tandem Balance with Counter Support  - 1 x daily - 3 x weekly - 2 sets - 2 reps - 10 hold - Standing Single Leg Stance with Counter Support  - 1 x daily - 3 x weekly - 2 sets - 2 reps - 5 hold  Updated 05/29/2022: Access Code: [email or web address] URL: [email or web address] Date: 05/29/2022 Prepared by: Temple Pacini  Exercises - Seated March  - 1 x daily - 5 x weekly - 3 sets - 10 reps - Seated Long Arc Quad  - 1 x daily - 7 x weekly - 3 sets - 10 reps -written on handout: seated core twists with holding YTB or RTB 5x weekly 3x10 each side  Previous: Pt performs leg lifts and squats with UE support    GOALS: Goals reviewed with patient? Yes  SHORT TERM GOALS: Target date: 06/24/2022    Patient will be independent in home exercise program to improve strength/mobility for better functional independence with ADLs. Baseline: No HEP currently  Goal status: INITIAL  LONG TERM GOALS: Target date: 08/19/2022  1.  Patient (> 45 years old) will complete five times sit to stand test in < 15 seconds indicating an increased LE strength and improved  balance. Baseline:  Test second visit ; 05/29/2022: 40 sec pull to stance RUE 07/10/22: 22.82s Goal status: ONGOING  2.  Patient will increase FOTO score by 8 or more points  to demonstrate statistically significant improvement in mobility and quality of life.  Baseline: Test second visit; 05/28/2040: 45; 07/10/22: 41 Goal status: INITIAL  3.  Patient will increase 10 meter walk test to >.40m/s as to improve gait speed for better community ambulation and to reduce fall risk. Baseline: Test visit 2 06/19/22: 53 sec 5/16: 77 sec Goal status: ONGOING   4. Patient will improve ability to ambulate on uneven surfaces as evidenced by ability to ambulate over simulated uneven terrain in clinic to improve independence with ambulation. Baseline: Patient not ambulating on uneven terrain Goal status: INITIAL   ASSESSMENT:  CLINICAL IMPRESSION: Pt continues to progress as able, she is heavily motivated to improve her function. Pt shows improved gait pattern in combination with pt's new 3-wheel walker. Patient is still getting used to the forces needed to safely navigate, while adjusting to the general size of the walker as well. Worked on closer replication of going in/out of house, utilizing semi-tandem split height balance on 6 inch step while reaching outside base of support. Continue to work on patients orientation to new 3-wheel walker.  Pt will continue to benefit from skilled physical therapy intervention to address impairments, improve QOL, and achieve therapy goals.   OBJECTIVE IMPAIRMENTS: Abnormal gait, decreased activity tolerance, decreased balance, decreased endurance, decreased strength, decreased safety awareness, hypomobility, impaired flexibility, and impaired UE functional use.   ACTIVITY LIMITATIONS: lifting, bending, standing, stairs, transfers, bed mobility, and locomotion level  PARTICIPATION LIMITATIONS: meal prep, cleaning, shopping, and community activity  PERSONAL FACTORS: Time  since onset of injury/illness/exacerbation and 3+ comorbidities: diabetes, HTN, Multiple previous strokes   are also affecting patient's functional outcome.   REHAB POTENTIAL: Fair secondary to time since onset and severely of visual deficits and  L UE funcitonal use   CLINICAL DECISION MAKING: Stable/uncomplicated  EVALUATION COMPLEXITY: Low  PLAN:  PT FREQUENCY: 2x/week  PT DURATION: 12 weeks  PLANNED INTERVENTIONS: Therapeutic exercises, Therapeutic activity, Neuromuscular re-education, Balance training, Gait training, Patient/Family education, Self Care, Joint mobilization, Stair training, Moist heat, Manual therapy, and Re-evaluation  PLAN FOR NEXT SESSION:   Continue with progressive LE strengthening,  ambulation training as indicated.  Dual task with gait training, using new 3-wheel walker as appropriate.   Cecile Sheerer, SPT  2:56 PM 08/13/22   Note: Portions of this document were prepared using Dragon voice recognition software and although reviewed may contain unintentional dictation errors in syntax, grammar, or spelling.

## 2022-08-14 ENCOUNTER — Encounter: Payer: Self-pay | Admitting: Physical Therapy

## 2022-08-16 ENCOUNTER — Encounter: Payer: Self-pay | Admitting: Gastroenterology

## 2022-08-18 ENCOUNTER — Encounter: Payer: Self-pay | Admitting: Physical Therapy

## 2022-08-18 ENCOUNTER — Ambulatory Visit: Payer: PPO

## 2022-08-18 ENCOUNTER — Other Ambulatory Visit: Payer: Self-pay

## 2022-08-18 ENCOUNTER — Ambulatory Visit: Payer: PPO | Admitting: Physical Therapy

## 2022-08-18 DIAGNOSIS — R262 Difficulty in walking, not elsewhere classified: Secondary | ICD-10-CM

## 2022-08-18 DIAGNOSIS — R269 Unspecified abnormalities of gait and mobility: Secondary | ICD-10-CM

## 2022-08-18 DIAGNOSIS — M6281 Muscle weakness (generalized): Secondary | ICD-10-CM | POA: Diagnosis not present

## 2022-08-18 DIAGNOSIS — R2681 Unsteadiness on feet: Secondary | ICD-10-CM

## 2022-08-18 NOTE — Therapy (Signed)
OUTPATIENT PHYSICAL THERAPY NEURO TREATMENT Physical Therapy Progress Note/Re-cert   Dates of reporting period  07/10/22  to  08/18/22     Patient Name: [name] MRN: [id number] DOB:11-12-1950, 72 y.o., female Today's Date: 08/18/2022   PCP:   Enid Baas, MD   REFERRING PROVIDER: Morene Crocker, MD   END OF SESSION:  PT End of Session - 08/18/22 1417     Visit Number 20    Number of Visits 44    Date for PT Re-Evaluation 11/10/22    Authorization Type [employer]    Progress Note Due on Visit 30    PT Start Time 1430    PT Stop Time 1510    PT Time Calculation (min) 40 min    Equipment Utilized During Treatment Gait belt    Activity Tolerance Patient tolerated treatment well;No increased pain    Behavior During Therapy [facility] for tasks assessed/performed                   Past Medical History:  Diagnosis Date   Abdominal pain 12/06/2013   Abnormality of gait 01/26/2013   Altered bowel habits 12/11/2017   Bruises easily    Constipation    Crohn's disease (HCC)    Depression    Dizziness and giddiness 01/26/2013   Fecal incontinence alternating with constipation 12/11/2017   GERD (gastroesophageal reflux disease)    Hemianopia    left   Hemiparesis and alteration of sensations as late effects of stroke (HCC) 01/26/2013   Hyperlipidemia    Hypertension    Hypothyroidism    Lump in female breast    Panic disorder    PONV (postoperative nausea and vomiting)    Stroke (HCC) 2009   Tremor, essential 04/24/2020   Type II diabetes mellitus (HCC)    Past Surgical History:  Procedure Laterality Date   ANAL FISSURE REPAIR  2008   APPENDECTOMY  1986   BREAST BIOPSY Left 2006   neg   BREAST LUMPECTOMY Right 1990's   CESAREAN SECTION  1981; 1983   CHOLECYSTECTOMY  1986   SMALL INTESTINE SURGERY  05/1991   TUBAL LIGATION  1983   Patient Active Problem List   Diagnosis Date Noted   Bone spur 06/13/2021   Acute CVA  (cerebrovascular accident) (HCC) 03/07/2021   CVA (cerebral vascular accident) (HCC) 11/01/2020   Chronic pain 04/25/2020   Chronic constipation 04/25/2020   Gastro-esophageal reflux disease without esophagitis 04/25/2020   Hardening of the aorta (main artery of the heart) (HCC) 04/25/2020   History of iron deficiency anemia 04/25/2020   Hypercholesteremia 04/25/2020   Hypertension 04/25/2020   Personal history of transient ischemic attack (TIA), and cerebral infarction without residual deficits 04/25/2020   Pulmonary emphysema (HCC) 04/25/2020   Vitamin D deficiency 04/25/2020   Tremor, essential 04/24/2020   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   Right lower quadrant abdominal pain 08/18/2018   Spastic hemiplegia affecting nondominant side (HCC) 02/03/2014   Diabetes mellitus (HCC) 09/07/2013   Hypothyroidism 09/07/2013   Type 2 diabetes mellitus without complications (HCC) 09/07/2013   Hemiparesis and alteration of sensations as late effects of stroke (HCC) 01/26/2013   Unsteady gait 01/26/2013   Dizziness and giddiness 01/26/2013    ONSET DATE: 2009  REFERRING DIAG: Z86.73 (ICD-10-CM) - History of stroke   THERAPY DIAG:  Muscle weakness (generalized)  Difficulty in walking, not elsewhere classified  Unsteadiness on feet  Abnormality of gait and mobility  Rationale for Evaluation and Treatment:  Rehabilitation  SUBJECTIVE:                                                                                                                                                                                             SUBJECTIVE STATEMENT:  Pt reports she is doing well, notes she is getting more acclimated to her new 3-wheel walker; getting it caught on objects less. She also notes discovering the walker is noticeably more unstable on the carpet in her bedroom.   Pt accompanied by: self  PERTINENT HISTORY: Pt has had one major stroke and 5 mini strokes since 2009. Pt is  here since her doctor wants her to work on her strength and balance and she also wants to improve in these categories. Pt has hemianopsia with blindness inferior, left side and superior. Pt initially had stroke in 2009 and was previously able to walk for long distances. Pt has 650 feet driveway that is gravel and used to walk up and down with the cane. Pt has issues with depth perception. Pt is unable to tell angles of concrete and holes in the ground. Pt has service dogs that are retired, one for guidance and one for helping with picking up and retrieving items. Pt had most recent stroke in January 2023 that further limited her field of vision.  Patient wants to work diligently on her strength, balance, mobility, and in order to improve her ability to ambulate on various surfaces in various scenarios.  Patient wanted to come to outpatient services to utilize equipment to improve her strength and a greater capacity than home health was able to assist her with.  She is doing home exercise program involving mostly standing with upper extremity support and activities to improve her strength.  Patient reports she is very nervous to ambulate without contact-guard assist and gait belt without being able to hold onto a wall or steady surface in order to provide her with tactile cues for the areas around her.  PAIN:  Are you having pain? None  PRECAUTIONS: Fall, hemianopsia  WEIGHT BEARING RESTRICTIONS: No  FALLS: Has patient fallen in last 6 months? No  PATIENT GOALS: Improve gait, improve strength, walk on unsteady surfaces  OBJECTIVE:   TODAY'S TREATMENT:  DATE: 08/18/22  Physical therapy treatment session today consisted of completing assessment of goals and administration of testing as demonstrated and documented in flow sheet, treatment, and goals section of this note.     PATIENT EDUCATION: Education details: discussed supination tendencies mentioned regarding LLE in context of ABDCT limb in gait/stance Person educated: Patient Education method: Explanation Education comprehension: verbalized understanding  HOME EXERCISE PROGRAM:   Access Code: [email or web address] URL: [email or web address] Date: 07/31/2022 Prepared by: Grier Rocher  Exercises - Standing Tandem Balance with Counter Support  - 1 x daily - 3 x weekly - 2 sets - 2 reps - 10 hold - Standing Single Leg Stance with Counter Support  - 1 x daily - 3 x weekly - 2 sets - 2 reps - 5 hold  Updated 05/29/2022: Access Code: [email or web address] URL: [email or web address] Date: 05/29/2022 Prepared by: Temple Pacini  Exercises - Seated March  - 1 x daily - 5 x weekly - 3 sets - 10 reps - Seated Long Arc Quad  - 1 x daily - 7 x weekly - 3 sets - 10 reps -written on handout: seated core twists with holding YTB or RTB 5x weekly 3x10 each side  Previous: Pt performs leg lifts and squats with UE support    GOALS: Goals reviewed with patient? Yes  SHORT TERM GOALS: Target date: 06/24/2022    Patient will be independent in home exercise program to improve strength/mobility for better functional independence with ADLs. Baseline: No HEP currently  Goal status: INITIAL  LONG TERM GOALS: Target date: 08/19/2022  1.  Patient (> 3 years old) will complete five times sit to stand test in < 15 seconds indicating an increased LE strength and improved balance. Baseline:  Test second visit ; 05/29/2022: 40 sec pull to stance RUE 07/10/22: 22.82 sec. 08/18/22: 16.36 sec with UE support Goal status: ONGOING  2.  Patient will increase FOTO score by 8 or more points  to demonstrate statistically significant improvement in mobility and quality of life.  Baseline: Test second visit; 05/28/2040: 45; 07/10/22: 41, 08/18/22: 45  Goal status: ONGOING  3.  Patient will increase 10 meter walk test to >.53m/s as  to improve gait speed for better community ambulation and to reduce fall risk. Baseline: Test visit 2 06/19/22: 53 sec 5/16: 77 sec. 08/18/22: 26.43 sec done with 3-wheel walker. Goal status: ONGOING   4. Patient will improve ability to ambulate on uneven surfaces as evidenced by ability to ambulate over simulated uneven terrain in clinic to improve independence with ambulation. Baseline: Patient not ambulating on uneven terrain. 08/18/22: pt ambulated across blue tri-fold PT mat using forearm cane, cane on ground next to mat. Two attempts done, pt hesitant and nervous for 1st attempt, ability to navigate over the mat improved upon 2nd attempt. Goal status: ONGOING   ASSESSMENT:  CLINICAL IMPRESSION: Patient appeared motivated and ready for treatment on this day. Today's session consisted on reassessment of pt's goals per progress report. Pt showing improvements in functional strength as evidenced by faster 5X STS times. Pt also showing improvements in ambulatory ability, and progress towards decreasing fall risk evidenced by improvement in 10-meter walk test speed. Beginning to work on unstable surface navigation whereas pt previously was not attempting to ambulate on unstable surfaces at all. Patient's condition has the potential to improve in response to therapy. Maximum improvement is yet to be obtained. The anticipated improvement is attainable and reasonable in a generally predictable time.  OBJECTIVE IMPAIRMENTS: Abnormal gait, decreased activity tolerance,  decreased balance, decreased endurance, decreased strength, decreased safety awareness, hypomobility, impaired flexibility, and impaired UE functional use.   ACTIVITY LIMITATIONS: lifting, bending, standing, stairs, transfers, bed mobility, and locomotion level  PARTICIPATION LIMITATIONS: meal prep, cleaning, shopping, and community activity  PERSONAL FACTORS: Time since onset of injury/illness/exacerbation and 3+ comorbidities: diabetes,  HTN, Multiple previous strokes   are also affecting patient's functional outcome.   REHAB POTENTIAL: Fair secondary to time since onset and severely of visual deficits and L UE funcitonal use   CLINICAL DECISION MAKING: Stable/uncomplicated  EVALUATION COMPLEXITY: Low  PLAN:  PT FREQUENCY: 2x/week  PT DURATION: 12 weeks  PLANNED INTERVENTIONS: Therapeutic exercises, Therapeutic activity, Neuromuscular re-education, Balance training, Gait training, Patient/Family education, Self Care, Joint mobilization, Stair training, Moist heat, Manual therapy, and Re-evaluation  PLAN FOR NEXT SESSION:  Continue with progressive LE strengthening,  ambulation training as indicated.  Dual task with gait training, using new 3-wheel walker as appropriate.   Cecile Sheerer, SPT  4:51 PM 08/18/22    I have read and reviewed the attached note and am in agreement with the documentation provided.     This licensed clinician was present and actively directing care throughout the session at all times.  Grier Rocher PT, DPT  Physical Therapist - [place]  [facility]  4:52 PM 08/18/22

## 2022-08-19 ENCOUNTER — Ambulatory Visit: Payer: PPO

## 2022-08-20 ENCOUNTER — Encounter: Payer: Self-pay | Admitting: Physical Therapy

## 2022-08-20 ENCOUNTER — Other Ambulatory Visit: Payer: Self-pay

## 2022-08-20 ENCOUNTER — Ambulatory Visit: Payer: PPO | Admitting: Physical Therapy

## 2022-08-20 DIAGNOSIS — R269 Unspecified abnormalities of gait and mobility: Secondary | ICD-10-CM

## 2022-08-20 DIAGNOSIS — M6281 Muscle weakness (generalized): Secondary | ICD-10-CM | POA: Diagnosis not present

## 2022-08-20 DIAGNOSIS — R2681 Unsteadiness on feet: Secondary | ICD-10-CM

## 2022-08-20 DIAGNOSIS — R262 Difficulty in walking, not elsewhere classified: Secondary | ICD-10-CM

## 2022-08-20 NOTE — Therapy (Signed)
OUTPATIENT PHYSICAL THERAPY NEURO TREATMENT    Patient Name: [name] MRN: [id number] DOB:06-15-1950, 72 y.o., female Today's Date: 08/20/2022   PCP:   Enid Baas, MD   REFERRING PROVIDER: Morene Crocker, MD   END OF SESSION:  PT End of Session - 08/20/22 1259     Visit Number 21    Number of Visits 44    Date for PT Re-Evaluation 11/10/22    Authorization Type [employer]    Progress Note Due on Visit 30    PT Start Time 1300    PT Stop Time 1343    PT Time Calculation (min) 43 min    Equipment Utilized During Treatment Gait belt    Activity Tolerance Patient tolerated treatment well;No increased pain    Behavior During Therapy [facility] for tasks assessed/performed                    Past Medical History:  Diagnosis Date   Abdominal pain 12/06/2013   Abnormality of gait 01/26/2013   Altered bowel habits 12/11/2017   Bruises easily    Constipation    Crohn's disease (HCC)    Depression    Dizziness and giddiness 01/26/2013   Fecal incontinence alternating with constipation 12/11/2017   GERD (gastroesophageal reflux disease)    Hemianopia    left   Hemiparesis and alteration of sensations as late effects of stroke (HCC) 01/26/2013   Hyperlipidemia    Hypertension    Hypothyroidism    Lump in female breast    Panic disorder    PONV (postoperative nausea and vomiting)    Stroke (HCC) 2009   Tremor, essential 04/24/2020   Type II diabetes mellitus (HCC)    Past Surgical History:  Procedure Laterality Date   ANAL FISSURE REPAIR  2008   APPENDECTOMY  1986   BREAST BIOPSY Left 2006   neg   BREAST LUMPECTOMY Right 1990's   CESAREAN SECTION  1981; 1983   CHOLECYSTECTOMY  1986   SMALL INTESTINE SURGERY  05/1991   TUBAL LIGATION  1983   Patient Active Problem List   Diagnosis Date Noted   Bone spur 06/13/2021   Acute CVA (cerebrovascular accident) (HCC) 03/07/2021   CVA (cerebral vascular accident) (HCC) 11/01/2020   Chronic  pain 04/25/2020   Chronic constipation 04/25/2020   Gastro-esophageal reflux disease without esophagitis 04/25/2020   Hardening of the aorta (main artery of the heart) (HCC) 04/25/2020   History of iron deficiency anemia 04/25/2020   Hypercholesteremia 04/25/2020   Hypertension 04/25/2020   Personal history of transient ischemic attack (TIA), and cerebral infarction without residual deficits 04/25/2020   Pulmonary emphysema (HCC) 04/25/2020   Vitamin D deficiency 04/25/2020   Tremor, essential 04/24/2020   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   Right lower quadrant abdominal pain 08/18/2018   Spastic hemiplegia affecting nondominant side (HCC) 02/03/2014   Diabetes mellitus (HCC) 09/07/2013   Hypothyroidism 09/07/2013   Type 2 diabetes mellitus without complications (HCC) 09/07/2013   Hemiparesis and alteration of sensations as late effects of stroke (HCC) 01/26/2013   Unsteady gait 01/26/2013   Dizziness and giddiness 01/26/2013    ONSET DATE: 2009  REFERRING DIAG: Z86.73 (ICD-10-CM) - History of stroke   THERAPY DIAG:  Muscle weakness (generalized)  Difficulty in walking, not elsewhere classified  Unsteadiness on feet  Abnormality of gait and mobility  Rationale for Evaluation and Treatment: Rehabilitation  SUBJECTIVE:  SUBJECTIVE STATEMENT:  Pt reports she is doing well, reports to therapy with only 3 wheel walker, reports she walked from entrance of hospital to therapy gym, reports getting a little tired after.   Pt accompanied by: self  PERTINENT HISTORY: Pt has had one major stroke and 5 mini strokes since 2009. Pt is here since her doctor wants her to work on her strength and balance and she also wants to improve in these categories. Pt has hemianopsia with blindness  inferior, left side and superior. Pt initially had stroke in 2009 and was previously able to walk for long distances. Pt has 650 feet driveway that is gravel and used to walk up and down with the cane. Pt has issues with depth perception. Pt is unable to tell angles of concrete and holes in the ground. Pt has service dogs that are retired, one for guidance and one for helping with picking up and retrieving items. Pt had most recent stroke in January 2023 that further limited her field of vision.  Patient wants to work diligently on her strength, balance, mobility, and in order to improve her ability to ambulate on various surfaces in various scenarios.  Patient wanted to come to outpatient services to utilize equipment to improve her strength and a greater capacity than home health was able to assist her with.  She is doing home exercise program involving mostly standing with upper extremity support and activities to improve her strength.  Patient reports she is very nervous to ambulate without contact-guard assist and gait belt without being able to hold onto a wall or steady surface in order to provide her with tactile cues for the areas around her.  PAIN:  Are you having pain? None  PRECAUTIONS: Fall, hemianopsia  WEIGHT BEARING RESTRICTIONS: No  FALLS: Has patient fallen in last 6 months? No  PATIENT GOALS: Improve gait, improve strength, walk on unsteady surfaces  OBJECTIVE:   TODAY'S TREATMENT:                                                                                                                              DATE: 08/20/22 Unless otherwise stated, CGA was provided and gait belt donned in order to ensure pt safety  TA Sit<>stand from standard height chair with CGA throughout session.   Stair training functionally for getting in / out of house x 15; R foot step up (6 inch) followed by ipsilateral cross reach ( with R UE) to cone on variable height cart, handing back to SPT on  opposite side, posterior-laterally.  Ambulation across blue tri-fold mat, 4 x with forearm cane on ground; on last attempt back, pt able to place forearm cane on pad with good response.  Gait with 3-wheel rollator x 175 ft and CGA from SPT. Pt ambulating with greater R sided clearance from previous visits, cues given to look ahead instead of down towards ground.    PATIENT EDUCATION: Education details: discussed supination  tendencies mentioned regarding LLE in context of ABDCT limb in gait/stance Person educated: Patient Education method: Explanation Education comprehension: verbalized understanding  HOME EXERCISE PROGRAM:   Access Code: [email or web address] URL: [email or web address] Date: 07/31/2022 Prepared by: Grier Rocher  Exercises - Standing Tandem Balance with Counter Support  - 1 x daily - 3 x weekly - 2 sets - 2 reps - 10 hold - Standing Single Leg Stance with Counter Support  - 1 x daily - 3 x weekly - 2 sets - 2 reps - 5 hold  Updated 05/29/2022: Access Code: [email or web address] URL: [email or web address] Date: 05/29/2022 Prepared by: Temple Pacini  Exercises - Seated March  - 1 x daily - 5 x weekly - 3 sets - 10 reps - Seated Long Arc Quad  - 1 x daily - 7 x weekly - 3 sets - 10 reps -written on handout: seated core twists with holding YTB or RTB 5x weekly 3x10 each side  Previous: Pt performs leg lifts and squats with UE support    GOALS: Goals reviewed with patient? Yes  SHORT TERM GOALS: Target date: 06/24/2022    Patient will be independent in home exercise program to improve strength/mobility for better functional independence with ADLs. Baseline: No HEP currently  Goal status: INITIAL  LONG TERM GOALS: Target date: 08/19/2022  1.  Patient (> 66 years old) will complete five times sit to stand test in < 15 seconds indicating an increased LE strength and improved balance. Baseline:  Test second visit ; 05/29/2022: 40 sec pull to stance RUE 07/10/22:  22.82 sec. 08/18/22: 16.36 sec with UE support Goal status: ONGOING  2.  Patient will increase FOTO score by 8 or more points  to demonstrate statistically significant improvement in mobility and quality of life.  Baseline: Test second visit; 05/28/2040: 45; 07/10/22: 41, 08/18/22: 45  Goal status: ONGOING  3.  Patient will increase 10 meter walk test to >.69m/s as to improve gait speed for better community ambulation and to reduce fall risk. Baseline: Test visit 2 06/19/22: 53 sec 5/16: 77 sec. 08/18/22: 26.43 sec done with 3-wheel walker. Goal status: ONGOING   4. Patient will improve ability to ambulate on uneven surfaces as evidenced by ability to ambulate over simulated uneven terrain in clinic to improve independence with ambulation. Baseline: Patient not ambulating on uneven terrain. 08/18/22: pt ambulated across blue tri-fold PT mat using forearm cane, cane on ground next to mat. Two attempts done, pt hesitant and nervous for 1st attempt, ability to navigate over the mat improved upon 2nd attempt. Goal status: ONGOING   ASSESSMENT:  CLINICAL IMPRESSION: Patient appeared motivated and ready for treatment on this day. Today's session consisted of continuing to work on functional weight shifting and reaching for greater ease with navigation in/out of house. Also worked on unstable surface ambulation per above, with pt showing improvements in ability and confidence AEB her able to place cane on unstable surface as well. Pt limited with session activity today due to tangential behavior. Pt will continue to benefit from skilled physical therapy intervention to address impairments, improve QOL, and attain therapy goals.    OBJECTIVE IMPAIRMENTS: Abnormal gait, decreased activity tolerance, decreased balance, decreased endurance, decreased strength, decreased safety awareness, hypomobility, impaired flexibility, and impaired UE functional use.   ACTIVITY LIMITATIONS: lifting, bending, standing,  stairs, transfers, bed mobility, and locomotion level  PARTICIPATION LIMITATIONS: meal prep, cleaning, shopping, and community activity  PERSONAL FACTORS: Time since onset of injury/illness/exacerbation and 3+ comorbidities: diabetes, HTN, Multiple previous strokes  are also affecting patient's functional outcome.   REHAB POTENTIAL: Fair secondary to time since onset and severely of visual deficits and L UE funcitonal use   CLINICAL DECISION MAKING: Stable/uncomplicated  EVALUATION COMPLEXITY: Low  PLAN:  PT FREQUENCY: 2x/week  PT DURATION: 12 weeks  PLANNED INTERVENTIONS: Therapeutic exercises, Therapeutic activity, Neuromuscular re-education, Balance training, Gait training, Patient/Family education, Self Care, Joint mobilization, Stair training, Moist heat, Manual therapy, and Re-evaluation  PLAN FOR NEXT SESSION:  Continue with progressive LE strengthening,  ambulation training as indicated.  Dual task with gait training, using new 3-wheel walker as appropriate.   Cecile Sheerer, SPT  2:07 PM 08/20/22    I have read and reviewed the attached note and am in agreement with the documentation provided.     This licensed clinician was present and actively directing care throughout the session at all times.  Grier Rocher PT, DPT  Physical Therapist - [facility]  2:07 PM 08/20/22

## 2022-08-21 ENCOUNTER — Ambulatory Visit: Payer: PPO

## 2022-08-25 ENCOUNTER — Encounter: Payer: Self-pay | Admitting: Physical Therapy

## 2022-08-25 ENCOUNTER — Other Ambulatory Visit: Payer: Self-pay

## 2022-08-25 ENCOUNTER — Ambulatory Visit: Payer: PPO | Attending: Neurology | Admitting: Physical Therapy

## 2022-08-25 DIAGNOSIS — R2689 Other abnormalities of gait and mobility: Secondary | ICD-10-CM | POA: Diagnosis not present

## 2022-08-25 DIAGNOSIS — R262 Difficulty in walking, not elsewhere classified: Secondary | ICD-10-CM | POA: Diagnosis not present

## 2022-08-25 DIAGNOSIS — R269 Unspecified abnormalities of gait and mobility: Secondary | ICD-10-CM | POA: Diagnosis not present

## 2022-08-25 DIAGNOSIS — R2681 Unsteadiness on feet: Secondary | ICD-10-CM | POA: Diagnosis not present

## 2022-08-25 DIAGNOSIS — R278 Other lack of coordination: Secondary | ICD-10-CM | POA: Insufficient documentation

## 2022-08-25 DIAGNOSIS — M6281 Muscle weakness (generalized): Secondary | ICD-10-CM | POA: Diagnosis not present

## 2022-08-25 NOTE — Therapy (Signed)
OUTPATIENT PHYSICAL THERAPY NEURO TREATMENT    Patient Name: [name] MRN: [id number] DOB:1950/07/06, 72 y.o., female Today's Date: 08/25/2022   PCP:   Enid Baas, MD   REFERRING PROVIDER: Morene Crocker, MD   END OF SESSION:  PT End of Session - 08/25/22 1637     Visit Number 22    Number of Visits 44    Date for PT Re-Evaluation 11/10/22    Authorization Type [employer]    Progress Note Due on Visit 30    PT Start Time 1516    PT Stop Time 1600    PT Time Calculation (min) 44 min    Equipment Utilized During Treatment Gait belt    Activity Tolerance Patient tolerated treatment well;No increased pain    Behavior During Therapy [facility] for tasks assessed/performed                     Past Medical History:  Diagnosis Date   Abdominal pain 12/06/2013   Abnormality of gait 01/26/2013   Altered bowel habits 12/11/2017   Bruises easily    Constipation    Crohn's disease (HCC)    Depression    Dizziness and giddiness 01/26/2013   Fecal incontinence alternating with constipation 12/11/2017   GERD (gastroesophageal reflux disease)    Hemianopia    left   Hemiparesis and alteration of sensations as late effects of stroke (HCC) 01/26/2013   Hyperlipidemia    Hypertension    Hypothyroidism    Lump in female breast    Panic disorder    PONV (postoperative nausea and vomiting)    Stroke (HCC) 2009   Tremor, essential 04/24/2020   Type II diabetes mellitus (HCC)    Past Surgical History:  Procedure Laterality Date   ANAL FISSURE REPAIR  2008   APPENDECTOMY  1986   BREAST BIOPSY Left 2006   neg   BREAST LUMPECTOMY Right 1990's   CESAREAN SECTION  1981; 1983   CHOLECYSTECTOMY  1986   SMALL INTESTINE SURGERY  05/1991   TUBAL LIGATION  1983   Patient Active Problem List   Diagnosis Date Noted   Bone spur 06/13/2021   Acute CVA (cerebrovascular accident) (HCC) 03/07/2021   CVA (cerebral vascular accident) (HCC) 11/01/2020   Chronic  pain 04/25/2020   Chronic constipation 04/25/2020   Gastro-esophageal reflux disease without esophagitis 04/25/2020   Hardening of the aorta (main artery of the heart) (HCC) 04/25/2020   History of iron deficiency anemia 04/25/2020   Hypercholesteremia 04/25/2020   Hypertension 04/25/2020   Personal history of transient ischemic attack (TIA), and cerebral infarction without residual deficits 04/25/2020   Pulmonary emphysema (HCC) 04/25/2020   Vitamin D deficiency 04/25/2020   Tremor, essential 04/24/2020   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   Right lower quadrant abdominal pain 08/18/2018   Spastic hemiplegia affecting nondominant side (HCC) 02/03/2014   Diabetes mellitus (HCC) 09/07/2013   Hypothyroidism 09/07/2013   Type 2 diabetes mellitus without complications (HCC) 09/07/2013   Hemiparesis and alteration of sensations as late effects of stroke (HCC) 01/26/2013   Unsteady gait 01/26/2013   Dizziness and giddiness 01/26/2013    ONSET DATE: 2009  REFERRING DIAG: Z86.73 (ICD-10-CM) - History of stroke   THERAPY DIAG:  Muscle weakness (generalized)  Difficulty in walking, not elsewhere classified  Unsteadiness on feet  Abnormality of gait and mobility  Other abnormalities of gait and mobility  Rationale for Evaluation and Treatment: Rehabilitation  SUBJECTIVE:  SUBJECTIVE STATEMENT:  Pt reports she is doing well, reports to therapy without 3WW this date. Reports she want to work on more cane based ambulation this date.    Pt accompanied by: self  PERTINENT HISTORY: Pt has had one major stroke and 5 mini strokes since 2009. Pt is here since her doctor wants her to work on her strength and balance and she also wants to improve in these categories. Pt has hemianopsia with  blindness inferior, left side and superior. Pt initially had stroke in 2009 and was previously able to walk for long distances. Pt has 650 feet driveway that is gravel and used to walk up and down with the cane. Pt has issues with depth perception. Pt is unable to tell angles of concrete and holes in the ground. Pt has service dogs that are retired, one for guidance and one for helping with picking up and retrieving items. Pt had most recent stroke in January 2023 that further limited her field of vision.  Patient wants to work diligently on her strength, balance, mobility, and in order to improve her ability to ambulate on various surfaces in various scenarios.  Patient wanted to come to outpatient services to utilize equipment to improve her strength and a greater capacity than home health was able to assist her with.  She is doing home exercise program involving mostly standing with upper extremity support and activities to improve her strength.  Patient reports she is very nervous to ambulate without contact-guard assist and gait belt without being able to hold onto a wall or steady surface in order to provide her with tactile cues for the areas around her.  PAIN:  Are you having pain? None  PRECAUTIONS: Fall, hemianopsia  WEIGHT BEARING RESTRICTIONS: No  FALLS: Has patient fallen in last 6 months? No  PATIENT GOALS: Improve gait, improve strength, walk on unsteady surfaces  OBJECTIVE:   TODAY'S TREATMENT:                                                                                                                              DATE: 08/25/22 Unless otherwise stated, CGA was provided and gait belt donned in order to ensure pt safety  TA Sit<>stand from standard height chair with CGA throughout session.   LLE on decline in frontal plane to improve LLE supination tone in stance  - 2 x 12 squats  - 2 x10 marches with R LE only   - pt has hip rotation initially but corrects with cues.    Ambulation across blue tri-fold mat and around 6 cones, 4 x with forearm cane on ground; pt hesitant with forearm cane placement on ground.   In front of hospital  - ambulation up ramp with cga.      PATIENT EDUCATION: Education details: discussed supination tendencies mentioned regarding LLE in context of ABDCT limb in gait/stance Person educated: Patient Education method: Explanation Education comprehension: verbalized understanding  HOME EXERCISE  PROGRAM:   Access Code: [email or web address] URL: [email or web address] Date: 07/31/2022 Prepared by: Grier Rocher  Exercises - Standing Tandem Balance with Counter Support  - 1 x daily - 3 x weekly - 2 sets - 2 reps - 10 hold - Standing Single Leg Stance with Counter Support  - 1 x daily - 3 x weekly - 2 sets - 2 reps - 5 hold  Updated 05/29/2022: Access Code: [email or web address] URL: [email or web address] Date: 05/29/2022 Prepared by: Temple Pacini  Exercises - Seated March  - 1 x daily - 5 x weekly - 3 sets - 10 reps - Seated Long Arc Quad  - 1 x daily - 7 x weekly - 3 sets - 10 reps -written on handout: seated core twists with holding YTB or RTB 5x weekly 3x10 each side  Previous: Pt performs leg lifts and squats with UE support    GOALS: Goals reviewed with patient? Yes  SHORT TERM GOALS: Target date: 06/24/2022    Patient will be independent in home exercise program to improve strength/mobility for better functional independence with ADLs. Baseline: No HEP currently  Goal status: INITIAL  LONG TERM GOALS: Target date: 08/19/2022  1.  Patient (> 46 years old) will complete five times sit to stand test in < 15 seconds indicating an increased LE strength and improved balance. Baseline:  Test second visit ; 05/29/2022: 40 sec pull to stance RUE 07/10/22: 22.82 sec. 08/18/22: 16.36 sec with UE support Goal status: ONGOING  2.  Patient will increase FOTO score by 8 or more points  to demonstrate statistically  significant improvement in mobility and quality of life.  Baseline: Test second visit; 05/28/2040: 45; 07/10/22: 41, 08/18/22: 45  Goal status: ONGOING  3.  Patient will increase 10 meter walk test to >.47m/s as to improve gait speed for better community ambulation and to reduce fall risk. Baseline: Test visit 2 06/19/22: 53 sec 5/16: 77 sec. 08/18/22: 26.43 sec done with 3-wheel walker. Goal status: ONGOING   4. Patient will improve ability to ambulate on uneven surfaces as evidenced by ability to ambulate over simulated uneven terrain in clinic to improve independence with ambulation. Baseline: Patient not ambulating on uneven terrain. 08/18/22: pt ambulated across blue tri-fold PT mat using forearm cane, cane on ground next to mat. Two attempts done, pt hesitant and nervous for 1st attempt, ability to navigate over the mat improved upon 2nd attempt. Goal status: ONGOING   ASSESSMENT:  CLINICAL IMPRESSION:  Patient appeared motivated and ready for treatment on this day. Today's session consisted of continuing to work on functional weight shifting and reaching for greater ease with navigation in/out of house. Also worked on unstable surface ambulation per above, with pt showing improvements in ability and confidence AEB her able to place cane on unstable surface as well. Pt limited with session activity today due to tangential behavior. Pt will continue to benefit from skilled physical therapy intervention to address impairments, improve QOL, and attain therapy goals.    OBJECTIVE IMPAIRMENTS: Abnormal gait, decreased activity tolerance, decreased balance, decreased endurance, decreased strength, decreased safety awareness, hypomobility, impaired flexibility, and impaired UE functional use.   ACTIVITY LIMITATIONS: lifting, bending, standing, stairs, transfers, bed mobility, and locomotion level  PARTICIPATION LIMITATIONS: meal prep, cleaning, shopping, and community activity  PERSONAL FACTORS:  Time since onset of injury/illness/exacerbation and 3+ comorbidities: diabetes, HTN, Multiple previous strokes   are also affecting patient's functional outcome.   REHAB POTENTIAL: Fair secondary to time since onset and severely of visual deficits  and L UE funcitonal use   CLINICAL DECISION MAKING: Stable/uncomplicated  EVALUATION COMPLEXITY: Low  PLAN:  PT FREQUENCY: 2x/week  PT DURATION: 12 weeks  PLANNED INTERVENTIONS: Therapeutic exercises, Therapeutic activity, Neuromuscular re-education, Balance training, Gait training, Patient/Family education, Self Care, Joint mobilization, Stair training, Moist heat, Manual therapy, and Re-evaluation  PLAN FOR NEXT SESSION:  Continue with progressive LE strengthening,  ambulation training as indicated.  Dual task with gait training, using new 3-wheel walker as appropriate.   Norman Herrlich PT ,DPT Physical Therapist- [facility]   4:38 PM 08/25/22

## 2022-08-26 ENCOUNTER — Other Ambulatory Visit: Payer: Self-pay

## 2022-08-26 ENCOUNTER — Encounter: Payer: Self-pay | Admitting: Physical Therapy

## 2022-08-26 ENCOUNTER — Telehealth: Payer: Self-pay | Admitting: Podiatry

## 2022-08-26 NOTE — Telephone Encounter (Signed)
Received [employer] auth for brace for [phone]) valid 6.17.24 thru 9.15.2024

## 2022-08-27 ENCOUNTER — Ambulatory Visit: Payer: PPO

## 2022-08-27 ENCOUNTER — Other Ambulatory Visit: Payer: Self-pay

## 2022-08-27 ENCOUNTER — Ambulatory Visit: Payer: PPO | Admitting: Physical Therapy

## 2022-08-27 DIAGNOSIS — R278 Other lack of coordination: Secondary | ICD-10-CM

## 2022-08-27 DIAGNOSIS — R2689 Other abnormalities of gait and mobility: Secondary | ICD-10-CM

## 2022-08-27 DIAGNOSIS — R269 Unspecified abnormalities of gait and mobility: Secondary | ICD-10-CM

## 2022-08-27 DIAGNOSIS — R2681 Unsteadiness on feet: Secondary | ICD-10-CM

## 2022-08-27 DIAGNOSIS — M6281 Muscle weakness (generalized): Secondary | ICD-10-CM | POA: Diagnosis not present

## 2022-08-27 DIAGNOSIS — R262 Difficulty in walking, not elsewhere classified: Secondary | ICD-10-CM

## 2022-08-27 NOTE — Therapy (Signed)
OUTPATIENT PHYSICAL THERAPY NEURO TREATMENT    Patient Name: [name] MRN: [id number] DOB:August 06, 1950, 72 y.o., female Today's Date: 08/27/2022   PCP:   Enid Baas, MD   REFERRING PROVIDER: Morene Crocker, MD   END OF SESSION:  PT End of Session - 08/27/22 1713     Visit Number 23    Number of Visits 44    Date for PT Re-Evaluation 11/10/22    Authorization Type [employer]    Progress Note Due on Visit 30    PT Start Time 1303    PT Stop Time 1345    PT Time Calculation (min) 42 min    Equipment Utilized During Treatment Gait belt    Activity Tolerance Patient tolerated treatment well;No increased pain    Behavior During Therapy [facility] for tasks assessed/performed                     Past Medical History:  Diagnosis Date   Abdominal pain 12/06/2013   Abnormality of gait 01/26/2013   Altered bowel habits 12/11/2017   Bruises easily    Constipation    Crohn's disease (HCC)    Depression    Dizziness and giddiness 01/26/2013   Fecal incontinence alternating with constipation 12/11/2017   GERD (gastroesophageal reflux disease)    Hemianopia    left   Hemiparesis and alteration of sensations as late effects of stroke (HCC) 01/26/2013   Hyperlipidemia    Hypertension    Hypothyroidism    Lump in female breast    Panic disorder    PONV (postoperative nausea and vomiting)    Stroke (HCC) 2009   Tremor, essential 04/24/2020   Type II diabetes mellitus (HCC)    Past Surgical History:  Procedure Laterality Date   ANAL FISSURE REPAIR  2008   APPENDECTOMY  1986   BREAST BIOPSY Left 2006   neg   BREAST LUMPECTOMY Right 1990's   CESAREAN SECTION  1981; 1983   CHOLECYSTECTOMY  1986   SMALL INTESTINE SURGERY  05/1991   TUBAL LIGATION  1983   Patient Active Problem List   Diagnosis Date Noted   Bone spur 06/13/2021   Acute CVA (cerebrovascular accident) (HCC) 03/07/2021   CVA (cerebral vascular accident) (HCC) 11/01/2020   Chronic  pain 04/25/2020   Chronic constipation 04/25/2020   Gastro-esophageal reflux disease without esophagitis 04/25/2020   Hardening of the aorta (main artery of the heart) (HCC) 04/25/2020   History of iron deficiency anemia 04/25/2020   Hypercholesteremia 04/25/2020   Hypertension 04/25/2020   Personal history of transient ischemic attack (TIA), and cerebral infarction without residual deficits 04/25/2020   Pulmonary emphysema (HCC) 04/25/2020   Vitamin D deficiency 04/25/2020   Tremor, essential 04/24/2020   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   Right lower quadrant abdominal pain 08/18/2018   Spastic hemiplegia affecting nondominant side (HCC) 02/03/2014   Diabetes mellitus (HCC) 09/07/2013   Hypothyroidism 09/07/2013   Type 2 diabetes mellitus without complications (HCC) 09/07/2013   Hemiparesis and alteration of sensations as late effects of stroke (HCC) 01/26/2013   Unsteady gait 01/26/2013   Dizziness and giddiness 01/26/2013    ONSET DATE: 2009  REFERRING DIAG: Z86.73 (ICD-10-CM) - History of stroke   THERAPY DIAG:  Muscle weakness (generalized)  Difficulty in walking, not elsewhere classified  Unsteadiness on feet  Abnormality of gait and mobility  Other lack of coordination  Other abnormalities of gait and mobility  Rationale for Evaluation and Treatment: Rehabilitation  SUBJECTIVE:  SUBJECTIVE STATEMENT:   Pt reports she is doing well, reports wanting to work on dynamic gait on unlevel surface of sidewalks and parking lots "black tops".   Pt accompanied by: self  PERTINENT HISTORY: Pt has had one major stroke and 5 mini strokes since 2009. Pt is here since her doctor wants her to work on her strength and balance and she also wants to improve in these categories. Pt has  hemianopsia with blindness inferior, left side and superior. Pt initially had stroke in 2009 and was previously able to walk for long distances. Pt has 650 feet driveway that is gravel and used to walk up and down with the cane. Pt has issues with depth perception. Pt is unable to tell angles of concrete and holes in the ground. Pt has service dogs that are retired, one for guidance and one for helping with picking up and retrieving items. Pt had most recent stroke in January 2023 that further limited her field of vision.  Patient wants to work diligently on her strength, balance, mobility, and in order to improve her ability to ambulate on various surfaces in various scenarios.  Patient wanted to come to outpatient services to utilize equipment to improve her strength and a greater capacity than home health was able to assist her with.  She is doing home exercise program involving mostly standing with upper extremity support and activities to improve her strength.  Patient reports she is very nervous to ambulate without contact-guard assist and gait belt without being able to hold onto a wall or steady surface in order to provide her with tactile cues for the areas around her.  PAIN:  Are you having pain? None  PRECAUTIONS: Fall, hemianopsia  WEIGHT BEARING RESTRICTIONS: No  FALLS: Has patient fallen in last 6 months? No  PATIENT GOALS: Improve gait, improve strength, walk on unsteady surfaces  OBJECTIVE:   TODAY'S TREATMENT:                                                                                                                              DATE: 08/27/22  Unless otherwise stated, CGA was provided and gait belt donned in order to ensure pt safety.   NMR Nustep levl 2-4 x 5 min cues for full ROM and decreased use of UE support for improved full ROM  on the LLE.   Transfer to and from [facility] with CGA as listed with UE support on handle or SPC with forearm support   Variable gait training  to force dynamic balance strategies on cement ramp to and form healing garden and across asphalt to service entrance at hospital.  Performed x 63ft on ramp and x 26ft on asphalt. Min cues for attention to task and awareness of R side of sidewalk to reduce fall risk and improve safety, given visual deficits.   Pt reports that she has gravel driveway, and wants to attempt gait on gravel on next session if  possible.     PATIENT EDUCATION: Education details: discussed supination tendencies mentioned regarding LLE in context of ABDCT limb in gait/stance Person educated: Patient Education method: Explanation Education comprehension: verbalized understanding  HOME EXERCISE PROGRAM:   Access Code: [email or web address] URL: [email or web address] Date: 07/31/2022 Prepared by: Grier Rocher  Exercises - Standing Tandem Balance with Counter Support  - 1 x daily - 3 x weekly - 2 sets - 2 reps - 10 hold - Standing Single Leg Stance with Counter Support  - 1 x daily - 3 x weekly - 2 sets - 2 reps - 5 hold  Updated 05/29/2022: Access Code: [email or web address] URL: [email or web address] Date: 05/29/2022 Prepared by: Temple Pacini  Exercises - Seated March  - 1 x daily - 5 x weekly - 3 sets - 10 reps - Seated Long Arc Quad  - 1 x daily - 7 x weekly - 3 sets - 10 reps -written on handout: seated core twists with holding YTB or RTB 5x weekly 3x10 each side  Previous: Pt performs leg lifts and squats with UE support    GOALS: Goals reviewed with patient? Yes  SHORT TERM GOALS: Target date: 06/24/2022    Patient will be independent in home exercise program to improve strength/mobility for better functional independence with ADLs. Baseline: No HEP currently  Goal status: INITIAL  LONG TERM GOALS: Target date: 08/19/2022  1.  Patient (> 30 years old) will complete five times sit to stand test in < 15 seconds indicating an increased LE strength and improved balance. Baseline:  Test second  visit ; 05/29/2022: 40 sec pull to stance RUE 07/10/22: 22.82 sec. 08/18/22: 16.36 sec with UE support Goal status: ONGOING  2.  Patient will increase FOTO score by 8 or more points  to demonstrate statistically significant improvement in mobility and quality of life.  Baseline: Test second visit; 05/28/2040: 45; 07/10/22: 41, 08/18/22: 45  Goal status: ONGOING  3.  Patient will increase 10 meter walk test to >.69m/s as to improve gait speed for better community ambulation and to reduce fall risk. Baseline: Test visit 2 06/19/22: 53 sec 5/16: 77 sec. 08/18/22: 26.43 sec done with 3-wheel walker. Goal status: ONGOING   4. Patient will improve ability to ambulate on uneven surfaces as evidenced by ability to ambulate over simulated uneven terrain in clinic to improve independence with ambulation. Baseline: Patient not ambulating on uneven terrain. 08/18/22: pt ambulated across blue tri-fold PT mat using forearm cane, cane on ground next to mat. Two attempts done, pt hesitant and nervous for 1st attempt, ability to navigate over the mat improved upon 2nd attempt. Goal status: ONGOING   ASSESSMENT:  CLINICAL IMPRESSION:  Patient appeared motivated and ready for treatment on this day. Today's session consisted of continuing to work on functional gait training in simulated community environment to improved management of real world tasks to access community and improve safety with gait at home. GCA required at most with min cues for attention to task and visual scanning to aviod walking off edge of sidewalk. Pt will continue to benefit from skilled physical therapy intervention to address impairments, improve QOL, and attain therapy goals.    OBJECTIVE IMPAIRMENTS: Abnormal gait, decreased activity tolerance, decreased balance, decreased endurance, decreased strength, decreased safety awareness, hypomobility, impaired flexibility, and impaired UE functional use.   ACTIVITY LIMITATIONS: lifting, bending,  standing, stairs, transfers, bed mobility, and locomotion level  PARTICIPATION LIMITATIONS: meal prep, cleaning, shopping, and community activity  PERSONAL FACTORS: Time since onset of injury/illness/exacerbation and  3+ comorbidities: diabetes, HTN, Multiple previous strokes   are also affecting patient's functional outcome.   REHAB POTENTIAL: Fair secondary to time since onset and severely of visual deficits and L UE funcitonal use   CLINICAL DECISION MAKING: Stable/uncomplicated  EVALUATION COMPLEXITY: Low  PLAN:  PT FREQUENCY: 2x/week  PT DURATION: 12 weeks  PLANNED INTERVENTIONS: Therapeutic exercises, Therapeutic activity, Neuromuscular re-education, Balance training, Gait training, Patient/Family education, Self Care, Joint mobilization, Stair training, Moist heat, Manual therapy, and Re-evaluation  PLAN FOR NEXT SESSION:    Gait training on gravel and in parking lot to simulate home and community.    [name] Pop PT ,DPT Physical Therapist- [place]  [facility]   5:17 PM 08/27/22

## 2022-09-01 ENCOUNTER — Other Ambulatory Visit: Payer: Self-pay

## 2022-09-01 ENCOUNTER — Ambulatory Visit: Payer: PPO | Admitting: Physical Therapy

## 2022-09-01 ENCOUNTER — Other Ambulatory Visit (HOSPITAL_COMMUNITY): Payer: Self-pay

## 2022-09-01 DIAGNOSIS — M6281 Muscle weakness (generalized): Secondary | ICD-10-CM | POA: Diagnosis not present

## 2022-09-01 DIAGNOSIS — R269 Unspecified abnormalities of gait and mobility: Secondary | ICD-10-CM

## 2022-09-01 DIAGNOSIS — E1142 Type 2 diabetes mellitus with diabetic polyneuropathy: Secondary | ICD-10-CM | POA: Diagnosis not present

## 2022-09-01 DIAGNOSIS — R262 Difficulty in walking, not elsewhere classified: Secondary | ICD-10-CM

## 2022-09-01 DIAGNOSIS — R2681 Unsteadiness on feet: Secondary | ICD-10-CM

## 2022-09-01 DIAGNOSIS — R2689 Other abnormalities of gait and mobility: Secondary | ICD-10-CM

## 2022-09-01 NOTE — Therapy (Unsigned)
OUTPATIENT PHYSICAL THERAPY NEURO TREATMENT    Patient Name: [name] MRN: [id number] DOB:01/25/1951, 72 y.o., female Today's Date: 09/01/2022   PCP:   Enid Baas, MD   REFERRING PROVIDER: Morene Crocker, MD   END OF SESSION:  PT End of Session - 09/01/22 1520     Visit Number 24    Number of Visits 44    Date for PT Re-Evaluation 11/10/22    Authorization Type [employer]    Progress Note Due on Visit 30    PT Start Time 1530    PT Stop Time 1615    PT Time Calculation (min) 45 min    Equipment Utilized During Treatment Gait belt    Activity Tolerance Patient tolerated treatment well;No increased pain    Behavior During Therapy [facility] for tasks assessed/performed                      Past Medical History:  Diagnosis Date   Abdominal pain 12/06/2013   Abnormality of gait 01/26/2013   Altered bowel habits 12/11/2017   Bruises easily    Constipation    Crohn's disease (HCC)    Depression    Dizziness and giddiness 01/26/2013   Fecal incontinence alternating with constipation 12/11/2017   GERD (gastroesophageal reflux disease)    Hemianopia    left   Hemiparesis and alteration of sensations as late effects of stroke (HCC) 01/26/2013   Hyperlipidemia    Hypertension    Hypothyroidism    Lump in female breast    Panic disorder    PONV (postoperative nausea and vomiting)    Stroke (HCC) 2009   Tremor, essential 04/24/2020   Type II diabetes mellitus (HCC)    Past Surgical History:  Procedure Laterality Date   ANAL FISSURE REPAIR  2008   APPENDECTOMY  1986   BREAST BIOPSY Left 2006   neg   BREAST LUMPECTOMY Right 1990's   CESAREAN SECTION  1981; 1983   CHOLECYSTECTOMY  1986   SMALL INTESTINE SURGERY  05/1991   TUBAL LIGATION  1983   Patient Active Problem List   Diagnosis Date Noted   Bone spur 06/13/2021   Acute CVA (cerebrovascular accident) (HCC) 03/07/2021   CVA (cerebral vascular accident) (HCC) 11/01/2020    Chronic pain 04/25/2020   Chronic constipation 04/25/2020   Gastro-esophageal reflux disease without esophagitis 04/25/2020   Hardening of the aorta (main artery of the heart) (HCC) 04/25/2020   History of iron deficiency anemia 04/25/2020   Hypercholesteremia 04/25/2020   Hypertension 04/25/2020   Personal history of transient ischemic attack (TIA), and cerebral infarction without residual deficits 04/25/2020   Pulmonary emphysema (HCC) 04/25/2020   Vitamin D deficiency 04/25/2020   Tremor, essential 04/24/2020   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   Right lower quadrant abdominal pain 08/18/2018   Spastic hemiplegia affecting nondominant side (HCC) 02/03/2014   Diabetes mellitus (HCC) 09/07/2013   Hypothyroidism 09/07/2013   Type 2 diabetes mellitus without complications (HCC) 09/07/2013   Hemiparesis and alteration of sensations as late effects of stroke (HCC) 01/26/2013   Unsteady gait 01/26/2013   Dizziness and giddiness 01/26/2013    ONSET DATE: 2009  REFERRING DIAG: Z86.73 (ICD-10-CM) - History of stroke   THERAPY DIAG:  Muscle weakness (generalized)  Difficulty in walking, not elsewhere classified  Unsteadiness on feet  Abnormality of gait and mobility  Other abnormalities of gait and mobility  Rationale for Evaluation and Treatment: Rehabilitation  SUBJECTIVE:  SUBJECTIVE STATEMENT:   Pt reports she is doing well, no new changes since last session. She does report she would like to get an occupational therapy referral and request that we send this to her doctor for her for signature.  Pt accompanied by: self  PERTINENT HISTORY: Pt has had one major stroke and 5 mini strokes since 2009. Pt is here since her doctor wants her to work on her strength and balance and she  also wants to improve in these categories. Pt has hemianopsia with blindness inferior, left side and superior. Pt initially had stroke in 2009 and was previously able to walk for long distances. Pt has 650 feet driveway that is gravel and used to walk up and down with the cane. Pt has issues with depth perception. Pt is unable to tell angles of concrete and holes in the ground. Pt has service dogs that are retired, one for guidance and one for helping with picking up and retrieving items. Pt had most recent stroke in January 2023 that further limited her field of vision.  Patient wants to work diligently on her strength, balance, mobility, and in order to improve her ability to ambulate on various surfaces in various scenarios.  Patient wanted to come to outpatient services to utilize equipment to improve her strength and a greater capacity than home health was able to assist her with.  She is doing home exercise program involving mostly standing with upper extremity support and activities to improve her strength.  Patient reports she is very nervous to ambulate without contact-guard assist and gait belt without being able to hold onto a wall or steady surface in order to provide her with tactile cues for the areas around her.  PAIN:  Are you having pain? None  PRECAUTIONS: Fall, hemianopsia  WEIGHT BEARING RESTRICTIONS: No  FALLS: Has patient fallen in last 6 months? No  PATIENT GOALS: Improve gait, improve strength, walk on unsteady surfaces  OBJECTIVE:   TODAY'S TREATMENT:                                                                                                                              DATE: 09/01/22  Unless otherwise stated, CGA was provided and gait belt donned in order to ensure pt safety.   TA  Initially patient had to use the restroom and stick up sometime at the beginning of therapy session.  Patient then brought outside to gravel area to simulate walking in her driveway.   Patient ambulates approximately 20 feet with forearm crutch and right upper extremity and close contact-guard assist from therapist.  Patient initially has difficulty with taking steps and feels very unsteady although she did not lose her balance she had high fear of falling with this activity.  With encouragement patient able to ambulate full 20 feet with good balance and then was transported with wheelchair back to therapy gym provided with some water due to heat index outside.  Physical therapist attempted with patient walking over unsteady surface of red mat with right forearm crutch.  Patient usually takes step onto red mat to the compliant surface of this in addition to the compliant surface of having her cane on this caused her significant instability.  Patient unable to safely take steps to put both feet on red mat and request another activity.  Physical therapist then attempts left foot step taps in order to assist patient with increasing confidence with red mat patient elects not to complete this activity due to feeling unsteady and fear of falling.  Physical therapist has explained to her with fear of falling is okay and it is okay if we cannot do this activity this date.  Following brief rest break patient then completes ambulation over less compliant of a surface but with ankle weights underneath in order to alter terrain slightly.  Patient please ambulation over this x 8 feet and then proceeds to sit in wheelchair to finish session.      PATIENT EDUCATION: Education details:Fear of falling and how this is a natural response and feeling toward higher level interventions.  Person educated: Patient Education method: Explanation Education comprehension: verbalized understanding  HOME EXERCISE PROGRAM:   Access Code: [email or web address] URL: [email or web address] Date: 07/31/2022 Prepared by: Grier Rocher  Exercises - Standing Tandem Balance with Counter Support  - 1 x daily - 3  x weekly - 2 sets - 2 reps - 10 hold - Standing Single Leg Stance with Counter Support  - 1 x daily - 3 x weekly - 2 sets - 2 reps - 5 hold  Updated 05/29/2022: Access Code: [email or web address] URL: [email or web address] Date: 05/29/2022 Prepared by: Temple Pacini  Exercises - Seated March  - 1 x daily - 5 x weekly - 3 sets - 10 reps - Seated Long Arc Quad  - 1 x daily - 7 x weekly - 3 sets - 10 reps -written on handout: seated core twists with holding YTB or RTB 5x weekly 3x10 each side  Previous: Pt performs leg lifts and squats with UE support    GOALS: Goals reviewed with patient? Yes  SHORT TERM GOALS: Target date: 06/24/2022    Patient will be independent in home exercise program to improve strength/mobility for better functional independence with ADLs. Baseline: No HEP currently  Goal status: INITIAL  LONG TERM GOALS: Target date: 08/19/2022  1.  Patient (> 72 years old) will complete five times sit to stand test in < 15 seconds indicating an increased LE strength and improved balance. Baseline:  Test second visit ; 05/29/2022: 40 sec pull to stance RUE 07/10/22: 22.82 sec. 08/18/22: 16.36 sec with UE support Goal status: ONGOING  2.  Patient will increase FOTO score by 8 or more points  to demonstrate statistically significant improvement in mobility and quality of life.  Baseline: Test second visit; 05/28/2040: 45; 07/10/22: 41, 08/18/22: 45  Goal status: ONGOING  3.  Patient will increase 10 meter walk test to >.83m/s as to improve gait speed for better community ambulation and to reduce fall risk. Baseline: Test visit 2 06/19/22: 53 sec 5/16: 77 sec. 08/18/22: 26.43 sec done with 3-wheel walker. Goal status: ONGOING   4. Patient will improve ability to ambulate on uneven surfaces as evidenced by ability to ambulate over simulated uneven terrain in clinic to improve independence with ambulation. Baseline: Patient not ambulating on uneven terrain. 08/18/22: pt ambulated across  blue tri-fold PT mat using forearm cane, cane on ground next to mat.  Two attempts done, pt hesitant and nervous for 1st attempt, ability to navigate over the mat improved upon 2nd attempt. Goal status: ONGOING   ASSESSMENT:  CLINICAL IMPRESSION:  Patient appeared motivated and ready for treatment on this day. Today's session consisted of continuing to work on functional gait training in simulated community environment to improved management of real world tasks to access community and improve safety with gait at home.  Patient has significant difficulty and was unable to complete ambulation over red mat due to in part to left ankle inversion/foot supination and the instability caused by this.  If patient is up for challenge may work on progressing to step taps on this just to get used to the surface.  Also continue to work on functional ambulation outdoors as the weather tolerates and as patient is able.  Physical therapist does right referral for occupational therapy and this was turned into front office staff to be sent to MD for signature and approval.Pt will continue to benefit from skilled physical therapy intervention to address impairments, improve QOL, and attain therapy goals.     OBJECTIVE IMPAIRMENTS: Abnormal gait, decreased activity tolerance, decreased balance, decreased endurance, decreased strength, decreased safety awareness, hypomobility, impaired flexibility, and impaired UE functional use.   ACTIVITY LIMITATIONS: lifting, bending, standing, stairs, transfers, bed mobility, and locomotion level  PARTICIPATION LIMITATIONS: meal prep, cleaning, shopping, and community activity  PERSONAL FACTORS: Time since onset of injury/illness/exacerbation and 3+ comorbidities: diabetes, HTN, Multiple previous strokes   are also affecting patient's functional outcome.   REHAB POTENTIAL: Fair secondary to time since onset and severely of visual deficits and L UE funcitonal use   CLINICAL  DECISION MAKING: Stable/uncomplicated  EVALUATION COMPLEXITY: Low  PLAN:  PT FREQUENCY: 2x/week  PT DURATION: 12 weeks  PLANNED INTERVENTIONS: Therapeutic exercises, Therapeutic activity, Neuromuscular re-education, Balance training, Gait training, Patient/Family education, Self Care, Joint mobilization, Stair training, Moist heat, Manual therapy, and Re-evaluation  PLAN FOR NEXT SESSION:    Gait training on gravel and in parking lot to simulate home and community.    Norman Herrlich PT ,DPT Physical Therapist- [place]  [facility]   3:21 PM 09/01/22

## 2022-09-02 ENCOUNTER — Telehealth: Payer: Self-pay | Admitting: Podiatry

## 2022-09-02 ENCOUNTER — Encounter: Payer: Self-pay | Admitting: Gastroenterology

## 2022-09-02 ENCOUNTER — Encounter: Payer: Self-pay | Admitting: Physical Therapy

## 2022-09-02 NOTE — Telephone Encounter (Signed)
1.  [name]  sees a very good because they contain a lot of fiber and can be taken in addition to other foods rich in fiber.  2.  The Linzess usually works best if taken every day.  Any reason why you do not wish to take it on a daily basis?  3.  We would be happy to give you a prescription if you think the medication has been helping you well

## 2022-09-02 NOTE — Telephone Encounter (Signed)
Pt called and left message yesterday 7.8 at 1228pm and has questions about diabetic shoes.   I returned call and she said her understanding was we needed to get prior auth for diabetic shoes.  I explained that pt has to come in to be measured and pick out the shoes and we have to get the documentation from the doctor that treats her diabetes before we can get the authorization from [employer]. She is scheduled to see Dr Logan Bores and discuss diabetic shoes and at that appt we should be able to schedule pt to come in for the diabetic shoe measurements to start the process.

## 2022-09-03 ENCOUNTER — Encounter: Payer: Self-pay | Admitting: Physical Therapy

## 2022-09-03 ENCOUNTER — Ambulatory Visit: Payer: PPO | Admitting: Physical Therapy

## 2022-09-03 DIAGNOSIS — R2681 Unsteadiness on feet: Secondary | ICD-10-CM

## 2022-09-03 DIAGNOSIS — M6281 Muscle weakness (generalized): Secondary | ICD-10-CM | POA: Diagnosis not present

## 2022-09-03 DIAGNOSIS — R2689 Other abnormalities of gait and mobility: Secondary | ICD-10-CM

## 2022-09-03 DIAGNOSIS — R269 Unspecified abnormalities of gait and mobility: Secondary | ICD-10-CM

## 2022-09-03 DIAGNOSIS — R262 Difficulty in walking, not elsewhere classified: Secondary | ICD-10-CM

## 2022-09-03 NOTE — Therapy (Unsigned)
OUTPATIENT PHYSICAL THERAPY NEURO TREATMENT    Patient Name: [name] MRN: [id number] DOB:December 30, 1950, 72 y.o., female Today's Date: 09/04/2022   PCP:   Enid Baas, MD   REFERRING PROVIDER: Morene Crocker, MD   END OF SESSION:  PT End of Session - 09/03/22 1513     Visit Number 25    Number of Visits 44    Date for PT Re-Evaluation 11/10/22    Authorization Type [employer]    Progress Note Due on Visit 30    PT Start Time 1532    PT Stop Time 1615    PT Time Calculation (min) 43 min    Equipment Utilized During Treatment Gait belt    Activity Tolerance Patient tolerated treatment well;No increased pain    Behavior During Therapy [facility] for tasks assessed/performed                      Past Medical History:  Diagnosis Date   Abdominal pain 12/06/2013   Abnormality of gait 01/26/2013   Altered bowel habits 12/11/2017   Bruises easily    Constipation    Crohn's disease (HCC)    Depression    Dizziness and giddiness 01/26/2013   Fecal incontinence alternating with constipation 12/11/2017   GERD (gastroesophageal reflux disease)    Hemianopia    left   Hemiparesis and alteration of sensations as late effects of stroke (HCC) 01/26/2013   Hyperlipidemia    Hypertension    Hypothyroidism    Lump in female breast    Panic disorder    PONV (postoperative nausea and vomiting)    Stroke (HCC) 2009   Tremor, essential 04/24/2020   Type II diabetes mellitus (HCC)    Past Surgical History:  Procedure Laterality Date   ANAL FISSURE REPAIR  2008   APPENDECTOMY  1986   BREAST BIOPSY Left 2006   neg   BREAST LUMPECTOMY Right 1990's   CESAREAN SECTION  1981; 1983   CHOLECYSTECTOMY  1986   SMALL INTESTINE SURGERY  05/1991   TUBAL LIGATION  1983   Patient Active Problem List   Diagnosis Date Noted   Bone spur 06/13/2021   Acute CVA (cerebrovascular accident) (HCC) 03/07/2021   CVA (cerebral vascular accident) (HCC) 11/01/2020    Chronic pain 04/25/2020   Chronic constipation 04/25/2020   Gastro-esophageal reflux disease without esophagitis 04/25/2020   Hardening of the aorta (main artery of the heart) (HCC) 04/25/2020   History of iron deficiency anemia 04/25/2020   Hypercholesteremia 04/25/2020   Hypertension 04/25/2020   Personal history of transient ischemic attack (TIA), and cerebral infarction without residual deficits 04/25/2020   Pulmonary emphysema (HCC) 04/25/2020   Vitamin D deficiency 04/25/2020   Tremor, essential 04/24/2020   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   Right lower quadrant abdominal pain 08/18/2018   Spastic hemiplegia affecting nondominant side (HCC) 02/03/2014   Diabetes mellitus (HCC) 09/07/2013   Hypothyroidism 09/07/2013   Type 2 diabetes mellitus without complications (HCC) 09/07/2013   Hemiparesis and alteration of sensations as late effects of stroke (HCC) 01/26/2013   Unsteady gait 01/26/2013   Dizziness and giddiness 01/26/2013    ONSET DATE: 2009  REFERRING DIAG: Z86.73 (ICD-10-CM) - History of stroke   THERAPY DIAG:  Muscle weakness (generalized)  Difficulty in walking, not elsewhere classified  Unsteadiness on feet  Abnormality of gait and mobility  Other abnormalities of gait and mobility  Rationale for Evaluation and Treatment: Rehabilitation  SUBJECTIVE:  SUBJECTIVE STATEMENT:   Pt reports she is doing well, no new changes since last session. She does report she would like to get an occupational therapy referral and request that we send this to her doctor for her for signature.  Pt accompanied by: self  PERTINENT HISTORY: Pt has had one major stroke and 5 mini strokes since 2009. Pt is here since her doctor wants her to work on her strength and balance and she  also wants to improve in these categories. Pt has hemianopsia with blindness inferior, left side and superior. Pt initially had stroke in 2009 and was previously able to walk for long distances. Pt has 650 feet driveway that is gravel and used to walk up and down with the cane. Pt has issues with depth perception. Pt is unable to tell angles of concrete and holes in the ground. Pt has service dogs that are retired, one for guidance and one for helping with picking up and retrieving items. Pt had most recent stroke in January 2023 that further limited her field of vision.  Patient wants to work diligently on her strength, balance, mobility, and in order to improve her ability to ambulate on various surfaces in various scenarios.  Patient wanted to come to outpatient services to utilize equipment to improve her strength and a greater capacity than home health was able to assist her with.  She is doing home exercise program involving mostly standing with upper extremity support and activities to improve her strength.  Patient reports she is very nervous to ambulate without contact-guard assist and gait belt without being able to hold onto a wall or steady surface in order to provide her with tactile cues for the areas around her.  PAIN:  Are you having pain? None  PRECAUTIONS: Fall, hemianopsia  WEIGHT BEARING RESTRICTIONS: No  FALLS: Has patient fallen in last 6 months? No  PATIENT GOALS: Improve gait, improve strength, walk on unsteady surfaces  OBJECTIVE:   TODAY'S TREATMENT:                                                                                                                              DATE: 09/04/22  Unless otherwise stated, CGA was provided and gait belt donned in order to ensure pt safety.   TA  STS without UE support x 10 with cues for forward weight shift  -Pt had initial difficulty and tends to perform with R UE support, with cues and practice ( forward weight shift and  appropriate positioning of feet for optimal force production and biomechanics.   Red mat surface accommodation tasks with R UE support on support surface x 10 reps of step taps onto mat -x 1 round of stepping B feet to airex then stepping off, good foot clearance noted on the left side.   X 2 rounds of ambulation over blue fall prevention met with objects placed underneath to alter surface.  Rest breaks between therapeutically  PATIENT EDUCATION: Education details:Fear of falling and how this is a natural response and feeling toward higher level interventions.  Person educated: Patient Education method: Explanation Education comprehension: verbalized understanding  HOME EXERCISE PROGRAM:   Access Code: [email or web address] URL: [email or web address] Date: 07/31/2022 Prepared by: Grier Rocher  Exercises - Standing Tandem Balance with Counter Support  - 1 x daily - 3 x weekly - 2 sets - 2 reps - 10 hold - Standing Single Leg Stance with Counter Support  - 1 x daily - 3 x weekly - 2 sets - 2 reps - 5 hold  Updated 05/29/2022: Access Code: [email or web address] URL: [email or web address] Date: 05/29/2022 Prepared by: Temple Pacini  Exercises - Seated March  - 1 x daily - 5 x weekly - 3 sets - 10 reps - Seated Long Arc Quad  - 1 x daily - 7 x weekly - 3 sets - 10 reps -written on handout: seated core twists with holding YTB or RTB 5x weekly 3x10 each side  Previous: Pt performs leg lifts and squats with UE support    GOALS: Goals reviewed with patient? Yes  SHORT TERM GOALS: Target date: 06/24/2022    Patient will be independent in home exercise program to improve strength/mobility for better functional independence with ADLs. Baseline: No HEP currently  Goal status: INITIAL  LONG TERM GOALS: Target date: 08/19/2022  1.  Patient (> 27 years old) will complete five times sit to stand test in < 15 seconds indicating an increased LE strength and improved  balance. Baseline:  Test second visit ; 05/29/2022: 40 sec pull to stance RUE 07/10/22: 22.82 sec. 08/18/22: 16.36 sec with UE support Goal status: ONGOING  2.  Patient will increase FOTO score by 8 or more points  to demonstrate statistically significant improvement in mobility and quality of life.  Baseline: Test second visit; 05/28/2040: 45; 07/10/22: 41, 08/18/22: 45  Goal status: ONGOING  3.  Patient will increase 10 meter walk test to >.34m/s as to improve gait speed for better community ambulation and to reduce fall risk. Baseline: Test visit 2 06/19/22: 53 sec 5/16: 77 sec. 08/18/22: 26.43 sec done with 3-wheel walker. Goal status: ONGOING   4. Patient will improve ability to ambulate on uneven surfaces as evidenced by ability to ambulate over simulated uneven terrain in clinic to improve independence with ambulation. Baseline: Patient not ambulating on uneven terrain. 08/18/22: pt ambulated across blue tri-fold PT mat using forearm cane, cane on ground next to mat. Two attempts done, pt hesitant and nervous for 1st attempt, ability to navigate over the mat improved upon 2nd attempt. Goal status: ONGOING   ASSESSMENT:  CLINICAL IMPRESSION:  Patient appeared motivated and ready for treatment on this day. Today's session consisted of continuing to work on functional gait training in simulated community environment to improved management of real world tasks to access community and improve safety with gait at home. Pt progresses with STS without UE assist and was able to complete consistently. Pt also showed great progress with stability on compliant surfaces.  Pt will continue to benefit from skilled physical therapy intervention to address impairments, improve QOL, and attain therapy goals.     OBJECTIVE IMPAIRMENTS: Abnormal gait, decreased activity tolerance, decreased balance, decreased endurance, decreased strength, decreased safety awareness, hypomobility, impaired flexibility, and impaired  UE functional use.   ACTIVITY LIMITATIONS: lifting, bending, standing, stairs, transfers, bed mobility, and locomotion level  PARTICIPATION LIMITATIONS: meal prep, cleaning, shopping, and community activity  PERSONAL FACTORS: Time since onset of  injury/illness/exacerbation and 3+ comorbidities: diabetes, HTN, Multiple previous strokes   are also affecting patient's functional outcome.   REHAB POTENTIAL: Fair secondary to time since onset and severely of visual deficits and L UE funcitonal use   CLINICAL DECISION MAKING: Stable/uncomplicated  EVALUATION COMPLEXITY: Low  PLAN:  PT FREQUENCY: 2x/week  PT DURATION: 12 weeks  PLANNED INTERVENTIONS: Therapeutic exercises, Therapeutic activity, Neuromuscular re-education, Balance training, Gait training, Patient/Family education, Self Care, Joint mobilization, Stair training, Moist heat, Manual therapy, and Re-evaluation  PLAN FOR NEXT SESSION:    Gait training on gravel and in parking lot to simulate home and community.    Norman Herrlich PT ,DPT Physical Therapist- [facility]   8:08 AM 09/04/22

## 2022-09-04 ENCOUNTER — Encounter: Payer: Self-pay | Admitting: Physical Therapy

## 2022-09-04 NOTE — Telephone Encounter (Signed)
Yes, can take it every other day if that is again too much can consider just switching to MiraLAX half a cap or 1 capful daily

## 2022-09-08 ENCOUNTER — Ambulatory Visit: Payer: PPO | Admitting: Physical Therapy

## 2022-09-09 ENCOUNTER — Other Ambulatory Visit: Payer: Self-pay

## 2022-09-10 ENCOUNTER — Ambulatory Visit: Payer: PPO | Admitting: Physical Therapy

## 2022-09-10 ENCOUNTER — Telehealth: Payer: Self-pay | Admitting: Podiatry

## 2022-09-10 DIAGNOSIS — E039 Hypothyroidism, unspecified: Secondary | ICD-10-CM | POA: Diagnosis not present

## 2022-09-10 DIAGNOSIS — E1142 Type 2 diabetes mellitus with diabetic polyneuropathy: Secondary | ICD-10-CM | POA: Diagnosis not present

## 2022-09-10 DIAGNOSIS — E559 Vitamin D deficiency, unspecified: Secondary | ICD-10-CM | POA: Diagnosis not present

## 2022-09-10 DIAGNOSIS — Z794 Long term (current) use of insulin: Secondary | ICD-10-CM | POA: Diagnosis not present

## 2022-09-10 DIAGNOSIS — E1159 Type 2 diabetes mellitus with other circulatory complications: Secondary | ICD-10-CM | POA: Diagnosis not present

## 2022-09-10 DIAGNOSIS — E785 Hyperlipidemia, unspecified: Secondary | ICD-10-CM | POA: Diagnosis not present

## 2022-09-10 DIAGNOSIS — E1169 Type 2 diabetes mellitus with other specified complication: Secondary | ICD-10-CM | POA: Diagnosis not present

## 2022-09-10 DIAGNOSIS — I152 Hypertension secondary to endocrine disorders: Secondary | ICD-10-CM | POA: Diagnosis not present

## 2022-09-10 NOTE — Telephone Encounter (Signed)
Pt has appt coming up and asking if you would sign the handicap placard paperwork. She has it and will bring it with her. I told pt that should be ok.

## 2022-09-12 ENCOUNTER — Ambulatory Visit: Payer: PPO

## 2022-09-15 ENCOUNTER — Encounter: Payer: Self-pay | Admitting: Physical Therapy

## 2022-09-15 ENCOUNTER — Ambulatory Visit: Payer: PPO | Admitting: Physical Therapy

## 2022-09-15 DIAGNOSIS — R269 Unspecified abnormalities of gait and mobility: Secondary | ICD-10-CM

## 2022-09-15 DIAGNOSIS — R2689 Other abnormalities of gait and mobility: Secondary | ICD-10-CM

## 2022-09-15 DIAGNOSIS — M6281 Muscle weakness (generalized): Secondary | ICD-10-CM | POA: Diagnosis not present

## 2022-09-15 DIAGNOSIS — R2681 Unsteadiness on feet: Secondary | ICD-10-CM

## 2022-09-15 DIAGNOSIS — R262 Difficulty in walking, not elsewhere classified: Secondary | ICD-10-CM

## 2022-09-15 NOTE — Therapy (Signed)
OUTPATIENT PHYSICAL THERAPY NEURO TREATMENT    Patient Name: [name] MRN: [id number] DOB:[birth year], 72 y.o., female Today's Date: 09/15/2022   PCP:   Enid Baas, MD   REFERRING PROVIDER: Morene Crocker, MD   END OF SESSION:  PT End of Session - 09/15/22 1551     Visit Number 26    Number of Visits 44    Date for PT Re-Evaluation 11/10/22    Authorization Type [employer]    Progress Note Due on Visit 30    PT Start Time 1530    PT Stop Time 1613    PT Time Calculation (min) 43 min    Equipment Utilized During Treatment Gait belt    Activity Tolerance Patient tolerated treatment well;No increased pain    Behavior During Therapy [facility] for tasks assessed/performed                       Past Medical History:  Diagnosis Date   Abdominal pain 12/06/2013   Abnormality of gait 01/26/2013   Altered bowel habits 12/11/2017   Bruises easily    Constipation    Crohn's disease (HCC)    Depression    Dizziness and giddiness 01/26/2013   Fecal incontinence alternating with constipation 12/11/2017   GERD (gastroesophageal reflux disease)    Hemianopia    left   Hemiparesis and alteration of sensations as late effects of stroke (HCC) 01/26/2013   Hyperlipidemia    Hypertension    Hypothyroidism    Lump in female breast    Panic disorder    PONV (postoperative nausea and vomiting)    Stroke (HCC) 2009   Tremor, essential 04/24/2020   Type II diabetes mellitus (HCC)    Past Surgical History:  Procedure Laterality Date   ANAL FISSURE REPAIR  2008   APPENDECTOMY  1986   BREAST BIOPSY Left 2006   neg   BREAST LUMPECTOMY Right 1990's   CESAREAN SECTION  1981; 1983   CHOLECYSTECTOMY  1986   SMALL INTESTINE SURGERY  05/1991   TUBAL LIGATION  1983   Patient Active Problem List   Diagnosis Date Noted   Bone spur 06/13/2021   Acute CVA (cerebrovascular accident) (HCC) 03/07/2021   CVA (cerebral vascular accident) (HCC) 11/01/2020    Chronic pain 04/25/2020   Chronic constipation 04/25/2020   Gastro-esophageal reflux disease without esophagitis 04/25/2020   Hardening of the aorta (main artery of the heart) (HCC) 04/25/2020   History of iron deficiency anemia 04/25/2020   Hypercholesteremia 04/25/2020   Hypertension 04/25/2020   Personal history of transient ischemic attack (TIA), and cerebral infarction without residual deficits 04/25/2020   Pulmonary emphysema (HCC) 04/25/2020   Vitamin D deficiency 04/25/2020   Tremor, essential 04/24/2020   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   Right lower quadrant abdominal pain 08/18/2018   Spastic hemiplegia affecting nondominant side (HCC) 02/03/2014   Diabetes mellitus (HCC) 09/07/2013   Hypothyroidism 09/07/2013   Type 2 diabetes mellitus without complications (HCC) 09/07/2013   Hemiparesis and alteration of sensations as late effects of stroke (HCC) 01/26/2013   Unsteady gait 01/26/2013   Dizziness and giddiness 01/26/2013    ONSET DATE: 2009  REFERRING DIAG: Z86.73 (ICD-10-CM) - History of stroke   THERAPY DIAG:  Difficulty in walking, not elsewhere classified  Unsteadiness on feet  Abnormality of gait and mobility  Other abnormalities of gait and mobility  Rationale for Evaluation and Treatment: Rehabilitation  SUBJECTIVE:  SUBJECTIVE STATEMENT:   Pt reports she is doing well, no new changes since last session.  Still patiently awaiting her AFO for her left lower extremity.  Pt accompanied by: self  PERTINENT HISTORY: Pt has had one major stroke and 5 mini strokes since 2009. Pt is here since her doctor wants her to work on her strength and balance and she also wants to improve in these categories. Pt has hemianopsia with blindness inferior, left side and superior.  Pt initially had stroke in 2009 and was previously able to walk for long distances. Pt has 650 feet driveway that is gravel and used to walk up and down with the cane. Pt has issues with depth perception. Pt is unable to tell angles of concrete and holes in the ground. Pt has service dogs that are retired, one for guidance and one for helping with picking up and retrieving items. Pt had most recent stroke in January 2023 that further limited her field of vision.  Patient wants to work diligently on her strength, balance, mobility, and in order to improve her ability to ambulate on various surfaces in various scenarios.  Patient wanted to come to outpatient services to utilize equipment to improve her strength and a greater capacity than home health was able to assist her with.  She is doing home exercise program involving mostly standing with upper extremity support and activities to improve her strength.  Patient reports she is very nervous to ambulate without contact-guard assist and gait belt without being able to hold onto a wall or steady surface in order to provide her with tactile cues for the areas around her.  PAIN:  Are you having pain? None  PRECAUTIONS: Fall, hemianopsia  WEIGHT BEARING RESTRICTIONS: No  FALLS: Has patient fallen in last 6 months? No  PATIENT GOALS: Improve gait, improve strength, walk on unsteady surfaces  OBJECTIVE:   TODAY'S TREATMENT:                                                                                                                              DATE: 09/15/22  Unless otherwise stated, CGA was provided and gait belt donned in order to ensure pt safety.  TE Standing at support bar x 10 each lower extremity marches with physical therapist blocking left lower extremity going into supination and 10 times right lower extremity hip abduction with physical therapist providing min assist to prevent left lower extremity from going into supination.  TA  Red  mat surface accommodation tasks with R UE support on support surface x 10 reps of step taps onto mat -x 1 round of stepping B feet to airex then stepping off, good foot clearance noted on the left side.  Ambulation across red mat with cane off of red mat, 1 lOb corrected with min A from PT until pt regained balance.   X 1 rounds of ambulation over blue fall prevention mat with objects placed underneath to alter  surface.  Rest breaks between therapeutically  NMR  EC balance training  X 1 min normal BOS  X 1 min in wide tandem ea LE  X 1 min with NBOS   PATIENT EDUCATION: Education details:Fear of falling and how this is a natural response and feeling toward higher level interventions.  Person educated: Patient Education method: Explanation Education comprehension: verbalized understanding  HOME EXERCISE PROGRAM:   Access Code: [email or web address] URL: [email or web address] Date: 07/31/2022 Prepared by: Grier Rocher  Exercises - Standing Tandem Balance with Counter Support  - 1 x daily - 3 x weekly - 2 sets - 2 reps - 10 hold - Standing Single Leg Stance with Counter Support  - 1 x daily - 3 x weekly - 2 sets - 2 reps - 5 hold  Updated 05/29/2022: Access Code: [email or web address] URL: [email or web address] Date: 05/29/2022 Prepared by: Temple Pacini  Exercises - Seated March  - 1 x daily - 5 x weekly - 3 sets - 10 reps - Seated Long Arc Quad  - 1 x daily - 7 x weekly - 3 sets - 10 reps -written on handout: seated core twists with holding YTB or RTB 5x weekly 3x10 each side  Previous: Pt performs leg lifts and squats with UE support    GOALS: Goals reviewed with patient? Yes  SHORT TERM GOALS: Target date: 06/24/2022    Patient will be independent in home exercise program to improve strength/mobility for better functional independence with ADLs. Baseline: No HEP currently  Goal status: INITIAL  LONG TERM GOALS: Target date: 08/19/2022  1.  Patient (> 3 years  old) will complete five times sit to stand test in < 15 seconds indicating an increased LE strength and improved balance. Baseline:  Test second visit ; 05/29/2022: 40 sec pull to stance RUE 07/10/22: 22.82 sec. 08/18/22: 16.36 sec with UE support Goal status: ONGOING  2.  Patient will increase FOTO score by 8 or more points  to demonstrate statistically significant improvement in mobility and quality of life.  Baseline: Test second visit; 05/28/2040: 45; 07/10/22: 41, 08/18/22: 45  Goal status: ONGOING  3.  Patient will increase 10 meter walk test to >.16m/s as to improve gait speed for better community ambulation and to reduce fall risk. Baseline: Test visit 2 06/19/22: 53 sec 5/16: 77 sec. 08/18/22: 26.43 sec done with 3-wheel walker. Goal status: ONGOING   4. Patient will improve ability to ambulate on uneven surfaces as evidenced by ability to ambulate over simulated uneven terrain in clinic to improve independence with ambulation. Baseline: Patient not ambulating on uneven terrain. 08/18/22: pt ambulated across blue tri-fold PT mat using forearm cane, cane on ground next to mat. Two attempts done, pt hesitant and nervous for 1st attempt, ability to navigate over the mat improved upon 2nd attempt. Goal status: ONGOING   ASSESSMENT:  CLINICAL IMPRESSION:  Patient appeared motivated and ready for treatment on this day.  Patient able to progress with ambulation over a red mat this date.  Patient had 1 loss of balance near end of red mat and was able to correct with mod assist and physical therapist setting her until she regained her composure and confidence.  Will continue to work on this in future sessions as patient is able and as is applicable to therapy treatment progression.  Following loss of balance this date patient was reserved with progression with other activities such as blue mat.  Patient does report that when she got off balance on red mat she  cannot tell if she was losing her balance to the  side due to her visual deficits.  This is why eyes closed balance activity with focus on harvesting the vestibular and proprioceptive senses was supplied this date.Pt will continue to benefit from skilled physical therapy intervention to address impairments, improve QOL, and attain therapy goals.     OBJECTIVE IMPAIRMENTS: Abnormal gait, decreased activity tolerance, decreased balance, decreased endurance, decreased strength, decreased safety awareness, hypomobility, impaired flexibility, and impaired UE functional use.   ACTIVITY LIMITATIONS: lifting, bending, standing, stairs, transfers, bed mobility, and locomotion level  PARTICIPATION LIMITATIONS: meal prep, cleaning, shopping, and community activity  PERSONAL FACTORS: Time since onset of injury/illness/exacerbation and 3+ comorbidities: diabetes, HTN, Multiple previous strokes   are also affecting patient's functional outcome.   REHAB POTENTIAL: Fair secondary to time since onset and severely of visual deficits and L UE funcitonal use   CLINICAL DECISION MAKING: Stable/uncomplicated  EVALUATION COMPLEXITY: Low  PLAN:  PT FREQUENCY: 2x/week  PT DURATION: 12 weeks  PLANNED INTERVENTIONS: Therapeutic exercises, Therapeutic activity, Neuromuscular re-education, Balance training, Gait training, Patient/Family education, Self Care, Joint mobilization, Stair training, Moist heat, Manual therapy, and Re-evaluation  PLAN FOR NEXT SESSION:    Gait training on gravel and in parking lot to simulate home and community.  Eyes closed balance to harness vestibular and proprioceptive senses.   Norman Herrlich PT ,DPT Physical Therapist- [place]  [facility]   3:53 PM 09/15/22

## 2022-09-17 ENCOUNTER — Encounter: Payer: Self-pay | Admitting: Physical Therapy

## 2022-09-17 ENCOUNTER — Ambulatory Visit: Payer: PPO | Admitting: Physical Therapy

## 2022-09-17 DIAGNOSIS — M6281 Muscle weakness (generalized): Secondary | ICD-10-CM | POA: Diagnosis not present

## 2022-09-17 DIAGNOSIS — R262 Difficulty in walking, not elsewhere classified: Secondary | ICD-10-CM

## 2022-09-17 DIAGNOSIS — R269 Unspecified abnormalities of gait and mobility: Secondary | ICD-10-CM

## 2022-09-17 DIAGNOSIS — R2681 Unsteadiness on feet: Secondary | ICD-10-CM

## 2022-09-17 DIAGNOSIS — R2689 Other abnormalities of gait and mobility: Secondary | ICD-10-CM

## 2022-09-17 NOTE — Therapy (Signed)
OUTPATIENT PHYSICAL THERAPY NEURO TREATMENT    Patient Name: [name] MRN: [id number] DOB:03/23/1950, 72 y.o., female Today's Date: 09/17/2022   PCP:  Enid Baas, MD   REFERRING PROVIDER: Morene Crocker, MD   END OF SESSION:  PT End of Session - 09/17/22 1442     Visit Number 27    Number of Visits 44    Date for PT Re-Evaluation 11/10/22    Authorization Type [employer]    Progress Note Due on Visit 30    PT Start Time 1445    PT Stop Time 1528    PT Time Calculation (min) 43 min    Equipment Utilized During Treatment Gait belt    Activity Tolerance Patient tolerated treatment well;No increased pain    Behavior During Therapy [facility] for tasks assessed/performed                       Past Medical History:  Diagnosis Date   Abdominal pain 12/06/2013   Abnormality of gait 01/26/2013   Altered bowel habits 12/11/2017   Bruises easily    Constipation    Crohn's disease (HCC)    Depression    Dizziness and giddiness 01/26/2013   Fecal incontinence alternating with constipation 12/11/2017   GERD (gastroesophageal reflux disease)    Hemianopia    left   Hemiparesis and alteration of sensations as late effects of stroke (HCC) 01/26/2013   Hyperlipidemia    Hypertension    Hypothyroidism    Lump in female breast    Panic disorder    PONV (postoperative nausea and vomiting)    Stroke (HCC) 2009   Tremor, essential 04/24/2020   Type II diabetes mellitus (HCC)    Past Surgical History:  Procedure Laterality Date   ANAL FISSURE REPAIR  2008   APPENDECTOMY  1986   BREAST BIOPSY Left 2006   neg   BREAST LUMPECTOMY Right 1990's   CESAREAN SECTION  1981; 1983   CHOLECYSTECTOMY  1986   SMALL INTESTINE SURGERY  05/1991   TUBAL LIGATION  1983   Patient Active Problem List   Diagnosis Date Noted   Bone spur 06/13/2021   Acute CVA (cerebrovascular accident) (HCC) 03/07/2021   CVA (cerebral vascular accident) (HCC) 11/01/2020    Chronic pain 04/25/2020   Chronic constipation 04/25/2020   Gastro-esophageal reflux disease without esophagitis 04/25/2020   Hardening of the aorta (main artery of the heart) (HCC) 04/25/2020   History of iron deficiency anemia 04/25/2020   Hypercholesteremia 04/25/2020   Hypertension 04/25/2020   Personal history of transient ischemic attack (TIA), and cerebral infarction without residual deficits 04/25/2020   Pulmonary emphysema (HCC) 04/25/2020   Vitamin D deficiency 04/25/2020   Tremor, essential 04/24/2020   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   Right lower quadrant abdominal pain 08/18/2018   Spastic hemiplegia affecting nondominant side (HCC) 02/03/2014   Diabetes mellitus (HCC) 09/07/2013   Hypothyroidism 09/07/2013   Type 2 diabetes mellitus without complications (HCC) 09/07/2013   Hemiparesis and alteration of sensations as late effects of stroke (HCC) 01/26/2013   Unsteady gait 01/26/2013   Dizziness and giddiness 01/26/2013    ONSET DATE: 2009  REFERRING DIAG: Z86.73 (ICD-10-CM) - History of stroke   THERAPY DIAG:  Difficulty in walking, not elsewhere classified  Unsteadiness on feet  Abnormality of gait and mobility  Other abnormalities of gait and mobility  Muscle weakness (generalized)  Rationale for Evaluation and Treatment: Rehabilitation  SUBJECTIVE:  SUBJECTIVE STATEMENT:   Pt reports her AFO appointment date has again been delayed due to her orthotist being called in for jury duty and being selected for jury duty.   Pt accompanied by: self  PERTINENT HISTORY: Pt has had one major stroke and 5 mini strokes since 2009. Pt is here since her doctor wants her to work on her strength and balance and she also wants to improve in these categories. Pt has hemianopsia  with blindness inferior, left side and superior. Pt initially had stroke in 2009 and was previously able to walk for long distances. Pt has 650 feet driveway that is gravel and used to walk up and down with the cane. Pt has issues with depth perception. Pt is unable to tell angles of concrete and holes in the ground. Pt has service dogs that are retired, one for guidance and one for helping with picking up and retrieving items. Pt had most recent stroke in January 2023 that further limited her field of vision.  Patient wants to work diligently on her strength, balance, mobility, and in order to improve her ability to ambulate on various surfaces in various scenarios.  Patient wanted to come to outpatient services to utilize equipment to improve her strength and a greater capacity than home health was able to assist her with.  She is doing home exercise program involving mostly standing with upper extremity support and activities to improve her strength.  Patient reports she is very nervous to ambulate without contact-guard assist and gait belt without being able to hold onto a wall or steady surface in order to provide her with tactile cues for the areas around her.  PAIN:  Are you having pain? None  PRECAUTIONS: Fall, hemianopsia  WEIGHT BEARING RESTRICTIONS: No  FALLS: Has patient fallen in last 6 months? No  PATIENT GOALS: Improve gait, improve strength, walk on unsteady surfaces  OBJECTIVE:   TODAY'S TREATMENT:                                                                                                                              DATE: 09/17/22  Unless otherwise stated, CGA was provided and gait belt donned in order to ensure pt safety.   TE  STS no UE support x several reps between ambulation exercises and balance exercises below  NMR  X 6 rounds of ambulation over blue fall prevention mat with objects placed underneath to alter surface.  Rest breaks between therapeutically  EC  balance training  3 X 1 min with NBOS  3 x 1 min normal BOS on airex pad   PATIENT EDUCATION: Education details:Fear of falling and how this is a natural response and feeling toward higher level interventions.  Person educated: Patient Education method: Explanation Education comprehension: verbalized understanding  HOME EXERCISE PROGRAM:   Access Code: [email or web address] URL: [email or web address] Date: 07/31/2022 Prepared by: Grier Rocher  Exercises - Standing Tandem Balance with Counter Support  - 1  x daily - 3 x weekly - 2 sets - 2 reps - 10 hold - Standing Single Leg Stance with Counter Support  - 1 x daily - 3 x weekly - 2 sets - 2 reps - 5 hold  Updated 05/29/2022: Access Code: [email or web address] URL: [email or web address] Date: 05/29/2022 Prepared by: Temple Pacini  Exercises - Seated March  - 1 x daily - 5 x weekly - 3 sets - 10 reps - Seated Long Arc Quad  - 1 x daily - 7 x weekly - 3 sets - 10 reps -written on handout: seated core twists with holding YTB or RTB 5x weekly 3x10 each side  Previous: Pt performs leg lifts and squats with UE support    GOALS: Goals reviewed with patient? Yes  SHORT TERM GOALS: Target date: 06/24/2022    Patient will be independent in home exercise program to improve strength/mobility for better functional independence with ADLs. Baseline: No HEP currently  Goal status: INITIAL  LONG TERM GOALS: Target date: 08/19/2022  1.  Patient (> 76 years old) will complete five times sit to stand test in < 15 seconds indicating an increased LE strength and improved balance. Baseline:  Test second visit ; 05/29/2022: 40 sec pull to stance RUE 07/10/22: 22.82 sec. 08/18/22: 16.36 sec with UE support Goal status: ONGOING  2.  Patient will increase FOTO score by 8 or more points  to demonstrate statistically significant improvement in mobility and quality of life.  Baseline: Test second visit; 05/28/2040: 45; 07/10/22: 41, 08/18/22: 45  Goal  status: ONGOING  3.  Patient will increase 10 meter walk test to >.8m/s as to improve gait speed for better community ambulation and to reduce fall risk. Baseline: Test visit 2 06/19/22: 53 sec 5/16: 77 sec. 08/18/22: 26.43 sec done with 3-wheel walker. Goal status: ONGOING   4. Patient will improve ability to ambulate on uneven surfaces as evidenced by ability to ambulate over simulated uneven terrain in clinic to improve independence with ambulation. Baseline: Patient not ambulating on uneven terrain. 08/18/22: pt ambulated across blue tri-fold PT mat using forearm cane, cane on ground next to mat. Two attempts done, pt hesitant and nervous for 1st attempt, ability to navigate over the mat improved upon 2nd attempt. Goal status: ONGOING   ASSESSMENT:  CLINICAL IMPRESSION:  Patient appeared motivated and ready for treatment on this day.  Pt progresses with ambulation across blue fall prevention mat with weights placed under them to work on ambulation across uneven surfaces. Pt showed progress with confidence and speed with this activity. Pt will continue to benefit from skilled physical therapy intervention to address impairments, improve QOL, and attain therapy goals.     OBJECTIVE IMPAIRMENTS: Abnormal gait, decreased activity tolerance, decreased balance, decreased endurance, decreased strength, decreased safety awareness, hypomobility, impaired flexibility, and impaired UE functional use.   ACTIVITY LIMITATIONS: lifting, bending, standing, stairs, transfers, bed mobility, and locomotion level  PARTICIPATION LIMITATIONS: meal prep, cleaning, shopping, and community activity  PERSONAL FACTORS: Time since onset of injury/illness/exacerbation and 3+ comorbidities: diabetes, HTN, Multiple previous strokes   are also affecting patient's functional outcome.   REHAB POTENTIAL: Fair secondary to time since onset and severely of visual deficits and L UE funcitonal use   CLINICAL DECISION MAKING:  Stable/uncomplicated  EVALUATION COMPLEXITY: Low  PLAN:  PT FREQUENCY: 2x/week  PT DURATION: 12 weeks  PLANNED INTERVENTIONS: Therapeutic exercises, Therapeutic activity, Neuromuscular re-education, Balance training, Gait training, Patient/Family education, Self Care, Joint mobilization, Stair training, Moist heat, Manual therapy,  and Re-evaluation  PLAN FOR NEXT SESSION:    Gait training on gravel and in parking lot to simulate home and community.  Eyes closed balance to harness vestibular and proprioceptive senses.   Norman Herrlich PT ,DPT Physical Therapist- [place]  [facility]   2:42 PM 09/17/22

## 2022-09-19 ENCOUNTER — Other Ambulatory Visit: Payer: Self-pay

## 2022-09-19 ENCOUNTER — Ambulatory Visit: Payer: PPO | Admitting: Podiatry

## 2022-09-19 DIAGNOSIS — B351 Tinea unguium: Secondary | ICD-10-CM | POA: Diagnosis not present

## 2022-09-19 DIAGNOSIS — M79674 Pain in right toe(s): Secondary | ICD-10-CM

## 2022-09-19 DIAGNOSIS — M79675 Pain in left toe(s): Secondary | ICD-10-CM

## 2022-09-19 MED ORDER — FREESTYLE LIBRE 3 SENSOR MISC
3 refills | Status: DC
  Filled 2022-09-19 – 2022-10-02 (×2): qty 6, 60d supply, fill #0
  Filled 2022-10-03 – 2022-10-06 (×2): qty 6, fill #0
  Filled 2022-10-07: qty 6, 60d supply, fill #0
  Filled 2022-10-08: qty 6, fill #0

## 2022-09-19 NOTE — Progress Notes (Addendum)
Chief Complaint  Patient presents with   Diabetes    est - dfc/ interested in getting new diabetic shoes    SUBJECTIVE Patient with a history of diabetes mellitus minimally ambulatory presenting today in a wheelchair presents to office today complaining of elongated, thickened nails that cause pain while ambulating in shoes.  Patient is unable to trim their own nails.  Patient has history of dropfoot deformity/hemiparesis/hemiparesis secondary to stroke.  Patient is here for further evaluation and treatment.   Past Medical History:  Diagnosis Date   Abdominal pain 12/06/2013   Abnormality of gait 01/26/2013   Altered bowel habits 12/11/2017   Bruises easily    Constipation    Crohn's disease (HCC)    Depression    Dizziness and giddiness 01/26/2013   Fecal incontinence alternating with constipation 12/11/2017   GERD (gastroesophageal reflux disease)    Hemianopia    left   Hemiparesis and alteration of sensations as late effects of stroke (HCC) 01/26/2013   Hyperlipidemia    Hypertension    Hypothyroidism    Lump in female breast    Panic disorder    PONV (postoperative nausea and vomiting)    Stroke (HCC) 2009   Tremor, essential 04/24/2020   Type II diabetes mellitus (HCC)    Past Surgical History:  Procedure Laterality Date   ANAL FISSURE REPAIR  2008   APPENDECTOMY  1986   BREAST BIOPSY Left 2006   neg   BREAST LUMPECTOMY Right 1990's   CESAREAN SECTION  1981; 1983   CHOLECYSTECTOMY  1986   SMALL INTESTINE SURGERY  05/1991   TUBAL LIGATION  1983   Allergies  Allergen Reactions   Codeine Other (See Comments)    Syncope.   Morphine And Codeine Nausea And Vomiting and Other (See Comments)    Hallucinations.   Baclofen Nausea Only and Other (See Comments)    dizziness   Metformin Hcl Other (See Comments)   Penicillins Other (See Comments)    Family history   Sulfamethoxazole Itching    Hands itching    Elemental Sulfur Itching    Per patient happened years  ago.     OBJECTIVE General Patient is awake, alert, and oriented x 3 and in no acute distress. Derm Skin is dry and supple bilateral. Negative open lesions or macerations. Remaining integument unremarkable. Nails are tender, long, thickened and dystrophic with subungual debris, consistent with onychomycosis, 1-5 bilateral. No signs of infection noted. Vasc  DP and PT pedal pulses palpable bilaterally. Temperature gradient within normal limits.  Neuro Epicritic and protective threshold sensation diminished bilaterally.  Musculoskeletal Exam hemiparesis is affecting the left lower extremity.  History of stroke.  Pes planovalgus deformity with arch collapse bilateral minimally ambulatory mostly in a wheelchair  ASSESSMENT 1. Diabetes Mellitus w/ peripheral neuropathy.  Encounter for diabetic foot exam 2.  Pain due to onychomycosis of toenails bilateral 3.  History of stroke causing left-sided hemiparesis  4.  Dropfoot deformity left lower extremity 5.  Pes planovalgus deformity bilateral  PLAN OF CARE -Patient evaluated today -Instructed to maintain good pedal hygiene and foot care. Stressed importance of controlling blood sugar.  -Mechanical debridement of nails 1-5 bilaterally performed using a nail nipper. Filed with dremel without incident.  -Continue AFO LLE.  New AFO brace pending at [facility] -Patient is getting new diabetic shoes and insoles at [facility] medical supply.  Diabetic shoes and insoles medically necessary to support the feet and prevent focal pressure areas of the  feet which could potentially progress to diabetic foot ulcers and support the medial longitudinal arch of the foot bilateral. -Advised against going barefoot -Return to clinic 3 months routine footcare    Felecia Shelling, DPM [facility]  Dr. Felecia Shelling, DPM    [address] [place], [place] [address]                Office [phone]  Fax  [phone]

## 2022-09-22 ENCOUNTER — Ambulatory Visit: Payer: PPO | Admitting: Physical Therapy

## 2022-09-22 ENCOUNTER — Other Ambulatory Visit: Payer: Self-pay

## 2022-09-22 ENCOUNTER — Telehealth: Payer: Self-pay

## 2022-09-22 ENCOUNTER — Telehealth: Payer: Self-pay | Admitting: Gastroenterology

## 2022-09-22 DIAGNOSIS — M6281 Muscle weakness (generalized): Secondary | ICD-10-CM | POA: Diagnosis not present

## 2022-09-22 DIAGNOSIS — R262 Difficulty in walking, not elsewhere classified: Secondary | ICD-10-CM

## 2022-09-22 DIAGNOSIS — R269 Unspecified abnormalities of gait and mobility: Secondary | ICD-10-CM

## 2022-09-22 DIAGNOSIS — R2689 Other abnormalities of gait and mobility: Secondary | ICD-10-CM

## 2022-09-22 DIAGNOSIS — R2681 Unsteadiness on feet: Secondary | ICD-10-CM

## 2022-09-22 NOTE — Telephone Encounter (Signed)
Patient called in wanting to schedule appointment with Dr. Allegra Lai for Chronic Constipation.

## 2022-09-22 NOTE — Telephone Encounter (Signed)
Patient left a message because she states she was calling to schedule a appointment with Dr. Allegra Lai. Called and left a message for call back so patient could schedule

## 2022-09-22 NOTE — Therapy (Signed)
OUTPATIENT PHYSICAL THERAPY NEURO TREATMENT    Patient Name: [name] MRN: [id number] DOB:[birth year], 72 y.o., female Today's Date: 09/22/2022   PCP:  Enid Baas, MD   REFERRING PROVIDER: Morene Crocker, MD   END OF SESSION:  PT End of Session - 09/22/22 1352     Visit Number 28    Number of Visits 44    Date for PT Re-Evaluation 11/10/22    Authorization Type [employer]    Progress Note Due on Visit 30    PT Start Time 1355    PT Stop Time 1438    PT Time Calculation (min) 43 min    Equipment Utilized During Treatment Gait belt    Activity Tolerance Patient tolerated treatment well;No increased pain    Behavior During Therapy [facility] for tasks assessed/performed                        Past Medical History:  Diagnosis Date   Abdominal pain 12/06/2013   Abnormality of gait 01/26/2013   Altered bowel habits 12/11/2017   Bruises easily    Constipation    Crohn's disease (HCC)    Depression    Dizziness and giddiness 01/26/2013   Fecal incontinence alternating with constipation 12/11/2017   GERD (gastroesophageal reflux disease)    Hemianopia    left   Hemiparesis and alteration of sensations as late effects of stroke (HCC) 01/26/2013   Hyperlipidemia    Hypertension    Hypothyroidism    Lump in female breast    Panic disorder    PONV (postoperative nausea and vomiting)    Stroke (HCC) 2009   Tremor, essential 04/24/2020   Type II diabetes mellitus (HCC)    Past Surgical History:  Procedure Laterality Date   ANAL FISSURE REPAIR  2008   APPENDECTOMY  1986   BREAST BIOPSY Left 2006   neg   BREAST LUMPECTOMY Right 1990's   CESAREAN SECTION  1981; 1983   CHOLECYSTECTOMY  1986   SMALL INTESTINE SURGERY  05/1991   TUBAL LIGATION  1983   Patient Active Problem List   Diagnosis Date Noted   Bone spur 06/13/2021   Acute CVA (cerebrovascular accident) (HCC) 03/07/2021   CVA (cerebral vascular accident) (HCC) 11/01/2020    Chronic pain 04/25/2020   Chronic constipation 04/25/2020   Gastro-esophageal reflux disease without esophagitis 04/25/2020   Hardening of the aorta (main artery of the heart) (HCC) 04/25/2020   History of iron deficiency anemia 04/25/2020   Hypercholesteremia 04/25/2020   Hypertension 04/25/2020   Personal history of transient ischemic attack (TIA), and cerebral infarction without residual deficits 04/25/2020   Pulmonary emphysema (HCC) 04/25/2020   Vitamin D deficiency 04/25/2020   Tremor, essential 04/24/2020   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   Right lower quadrant abdominal pain 08/18/2018   Spastic hemiplegia affecting nondominant side (HCC) 02/03/2014   Diabetes mellitus (HCC) 09/07/2013   Hypothyroidism 09/07/2013   Type 2 diabetes mellitus without complications (HCC) 09/07/2013   Hemiparesis and alteration of sensations as late effects of stroke (HCC) 01/26/2013   Unsteady gait 01/26/2013   Dizziness and giddiness 01/26/2013    ONSET DATE: 2009  REFERRING DIAG: Z86.73 (ICD-10-CM) - History of stroke   THERAPY DIAG:  Difficulty in walking, not elsewhere classified  Unsteadiness on feet  Abnormality of gait and mobility  Other abnormalities of gait and mobility  Rationale for Evaluation and Treatment: Rehabilitation  SUBJECTIVE:  SUBJECTIVE STATEMENT:   Pt reports multiple life stressors over the weekend including some family concerns. No falls or LOB since last session. Pt brings 3WW for ambulation practice.   Pt accompanied by: self  PERTINENT HISTORY: Pt has had one major stroke and 5 mini strokes since 2009. Pt is here since her doctor wants her to work on her strength and balance and she also wants to improve in these categories. Pt has hemianopsia with blindness  inferior, left side and superior. Pt initially had stroke in 2009 and was previously able to walk for long distances. Pt has 650 feet driveway that is gravel and used to walk up and down with the cane. Pt has issues with depth perception. Pt is unable to tell angles of concrete and holes in the ground. Pt has service dogs that are retired, one for guidance and one for helping with picking up and retrieving items. Pt had most recent stroke in January 2023 that further limited her field of vision.  Patient wants to work diligently on her strength, balance, mobility, and in order to improve her ability to ambulate on various surfaces in various scenarios.  Patient wanted to come to outpatient services to utilize equipment to improve her strength and a greater capacity than home health was able to assist her with.  She is doing home exercise program involving mostly standing with upper extremity support and activities to improve her strength.  Patient reports she is very nervous to ambulate without contact-guard assist and gait belt without being able to hold onto a wall or steady surface in order to provide her with tactile cues for the areas around her.  PAIN:  Are you having pain? None  PRECAUTIONS: Fall, hemianopsia  WEIGHT BEARING RESTRICTIONS: No  FALLS: Has patient fallen in last 6 months? No  PATIENT GOALS: Improve gait, improve strength, walk on unsteady surfaces  OBJECTIVE:   TODAY'S TREATMENT:                                                                                                                              DATE: 09/22/22  Unless otherwise stated, CGA was provided and gait belt donned in order to ensure pt safety.   TA Ambulation with 3WW from waiting area to // bars, SBA for this   STS no UE support 2x 10 reps  -cues for eccentric control with stand to sit  The following activities were completed in parallel bars  X 4 rounds of ambulation over red mat in // bars Seated  rest  X 4 rounds of side stepping over red mat in // bars  Ambulation from clinic to elevators with 3WW  PATIENT EDUCATION: Education details:Fear of falling and how this is a natural response and feeling toward higher level interventions.  Person educated: Patient Education method: Explanation Education comprehension: verbalized understanding  HOME EXERCISE PROGRAM:   Access Code: [email or web address] URL: [email or web address] Date: 07/31/2022 Prepared by: Grier Rocher  Exercises - Standing Tandem Balance with Counter Support  - 1 x daily - 3 x weekly - 2 sets - 2 reps - 10 hold - Standing Single Leg Stance with Counter Support  - 1 x daily - 3 x weekly - 2 sets - 2 reps - 5 hold  Updated 05/29/2022: Access Code: [email or web address] URL: [email or web address] Date: 05/29/2022 Prepared by: Temple Pacini  Exercises - Seated March  - 1 x daily - 5 x weekly - 3 sets - 10 reps - Seated Long Arc Quad  - 1 x daily - 7 x weekly - 3 sets - 10 reps -written on handout: seated core twists with holding YTB or RTB 5x weekly 3x10 each side  Previous: Pt performs leg lifts and squats with UE support    GOALS: Goals reviewed with patient? Yes  SHORT TERM GOALS: Target date: 06/24/2022    Patient will be independent in home exercise program to improve strength/mobility for better functional independence with ADLs. Baseline: No HEP currently  Goal status: INITIAL  LONG TERM GOALS: Target date: 08/19/2022  1.  Patient (> 60 years old) will complete five times sit to stand test in < 15 seconds indicating an increased LE strength and improved balance. Baseline:  Test second visit ; 05/29/2022: 40 sec pull to stance RUE 07/10/22: 22.82 sec. 08/18/22: 16.36 sec with UE support Goal status: ONGOING  2.  Patient will increase FOTO score by 8 or more points  to demonstrate statistically significant improvement in mobility and quality of life.  Baseline: Test second visit; 05/28/2040: 45;  07/10/22: 41, 08/18/22: 45  Goal status: ONGOING  3.  Patient will increase 10 meter walk test to >.32m/s as to improve gait speed for better community ambulation and to reduce fall risk. Baseline: Test visit 2 06/19/22: 53 sec 5/16: 77 sec. 08/18/22: 26.43 sec done with 3-wheel walker. Goal status: ONGOING   4. Patient will improve ability to ambulate on uneven surfaces as evidenced by ability to ambulate over simulated uneven terrain in clinic to improve independence with ambulation. Baseline: Patient not ambulating on uneven terrain. 08/18/22: pt ambulated across blue tri-fold PT mat using forearm cane, cane on ground next to mat. Two attempts done, pt hesitant and nervous for 1st attempt, ability to navigate over the mat improved upon 2nd attempt. Goal status: ONGOING   ASSESSMENT:  CLINICAL IMPRESSION:  Patient appeared motivated and ready for treatment on this day.  Pt makes progress with ambulation over compliant surfaces of red mat and also makes progress with ambulation with 3WW. No overt LOB this date, pt confidence increasing with red mat due to // bars.  Pt will continue to benefit from skilled physical therapy intervention to address impairments, improve QOL, and attain therapy goals.     OBJECTIVE IMPAIRMENTS: Abnormal gait, decreased activity tolerance, decreased balance, decreased endurance, decreased strength, decreased safety awareness, hypomobility, impaired flexibility, and impaired UE functional use.   ACTIVITY LIMITATIONS: lifting, bending, standing, stairs, transfers, bed mobility, and locomotion level  PARTICIPATION LIMITATIONS: meal prep, cleaning, shopping, and community activity  PERSONAL FACTORS: Time since onset of injury/illness/exacerbation and 3+ comorbidities: diabetes, HTN, Multiple previous strokes   are also affecting patient's functional outcome.   REHAB POTENTIAL: Fair secondary to time since onset and severely of visual deficits and L UE funcitonal use    CLINICAL DECISION MAKING: Stable/uncomplicated  EVALUATION COMPLEXITY: Low  PLAN:  PT FREQUENCY: 2x/week  PT DURATION: 12 weeks  PLANNED INTERVENTIONS: Therapeutic exercises, Therapeutic activity, Neuromuscular re-education, Balance  training, Gait training, Patient/Family education, Self Care, Joint mobilization, Stair training, Moist heat, Manual therapy, and Re-evaluation  PLAN FOR NEXT SESSION:  Gait training on gravel and in parking lot to simulate home and community.  Eyes closed balance to harness vestibular and proprioceptive senses.   Norman Herrlich PT ,DPT Physical Therapist- [place]  [facility]   2:33 PM 09/22/22

## 2022-09-24 ENCOUNTER — Encounter: Payer: Self-pay | Admitting: Physical Therapy

## 2022-09-24 ENCOUNTER — Ambulatory Visit: Payer: PPO | Admitting: Physical Therapy

## 2022-09-24 DIAGNOSIS — R262 Difficulty in walking, not elsewhere classified: Secondary | ICD-10-CM

## 2022-09-24 DIAGNOSIS — R2689 Other abnormalities of gait and mobility: Secondary | ICD-10-CM

## 2022-09-24 DIAGNOSIS — M6281 Muscle weakness (generalized): Secondary | ICD-10-CM | POA: Diagnosis not present

## 2022-09-24 DIAGNOSIS — R2681 Unsteadiness on feet: Secondary | ICD-10-CM

## 2022-09-24 DIAGNOSIS — R269 Unspecified abnormalities of gait and mobility: Secondary | ICD-10-CM

## 2022-09-24 NOTE — Therapy (Signed)
OUTPATIENT PHYSICAL THERAPY NEURO TREATMENT    Patient Name: [name] MRN: [id number] DOB:01/11/1951, 72 y.o., female Today's Date: 09/24/2022   PCP:  Enid Baas, MD   REFERRING PROVIDER: Morene Crocker, MD   END OF SESSION:  PT End of Session - 09/24/22 1412     Visit Number 29    Number of Visits 44    Date for PT Re-Evaluation 11/10/22    Authorization Type [employer]    Progress Note Due on Visit 30    PT Start Time 1401    PT Stop Time 1443    PT Time Calculation (min) 42 min    Equipment Utilized During Treatment Gait belt    Activity Tolerance Patient tolerated treatment well;No increased pain    Behavior During Therapy [facility] for tasks assessed/performed                         Past Medical History:  Diagnosis Date   Abdominal pain 12/06/2013   Abnormality of gait 01/26/2013   Altered bowel habits 12/11/2017   Bruises easily    Constipation    Crohn's disease (HCC)    Depression    Dizziness and giddiness 01/26/2013   Fecal incontinence alternating with constipation 12/11/2017   GERD (gastroesophageal reflux disease)    Hemianopia    left   Hemiparesis and alteration of sensations as late effects of stroke (HCC) 01/26/2013   Hyperlipidemia    Hypertension    Hypothyroidism    Lump in female breast    Panic disorder    PONV (postoperative nausea and vomiting)    Stroke (HCC) 2009   Tremor, essential 04/24/2020   Type II diabetes mellitus (HCC)    Past Surgical History:  Procedure Laterality Date   ANAL FISSURE REPAIR  2008   APPENDECTOMY  1986   BREAST BIOPSY Left 2006   neg   BREAST LUMPECTOMY Right 1990's   CESAREAN SECTION  1981; 1983   CHOLECYSTECTOMY  1986   SMALL INTESTINE SURGERY  05/1991   TUBAL LIGATION  1983   Patient Active Problem List   Diagnosis Date Noted   Bone spur 06/13/2021   Acute CVA (cerebrovascular accident) (HCC) 03/07/2021   CVA (cerebral vascular accident) (HCC) 11/01/2020    Chronic pain 04/25/2020   Chronic constipation 04/25/2020   Gastro-esophageal reflux disease without esophagitis 04/25/2020   Hardening of the aorta (main artery of the heart) (HCC) 04/25/2020   History of iron deficiency anemia 04/25/2020   Hypercholesteremia 04/25/2020   Hypertension 04/25/2020   Personal history of transient ischemic attack (TIA), and cerebral infarction without residual deficits 04/25/2020   Pulmonary emphysema (HCC) 04/25/2020   Vitamin D deficiency 04/25/2020   Tremor, essential 04/24/2020   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   Right lower quadrant abdominal pain 08/18/2018   Spastic hemiplegia affecting nondominant side (HCC) 02/03/2014   Diabetes mellitus (HCC) 09/07/2013   Hypothyroidism 09/07/2013   Type 2 diabetes mellitus without complications (HCC) 09/07/2013   Hemiparesis and alteration of sensations as late effects of stroke (HCC) 01/26/2013   Unsteady gait 01/26/2013   Dizziness and giddiness 01/26/2013    ONSET DATE: 2009  REFERRING DIAG: Z86.73 (ICD-10-CM) - History of stroke   THERAPY DIAG:  Difficulty in walking, not elsewhere classified  Unsteadiness on feet  Other abnormalities of gait and mobility  Abnormality of gait and mobility  Muscle weakness (generalized)  Rationale for Evaluation and Treatment: Rehabilitation  SUBJECTIVE:  SUBJECTIVE STATEMENT:   Pt reports she is getting new AFO tomorrow hopefully. Pt brings 3WW for ambulation practice.  Patient reports ability to ambulate different areas outside of her house since last session with improved confidence.  Pt accompanied by: self  PERTINENT HISTORY: Pt has had one major stroke and 5 mini strokes since 2009. Pt is here since her doctor wants her to work on her strength and balance  and she also wants to improve in these categories. Pt has hemianopsia with blindness inferior, left side and superior. Pt initially had stroke in 2009 and was previously able to walk for long distances. Pt has 650 feet driveway that is gravel and used to walk up and down with the cane. Pt has issues with depth perception. Pt is unable to tell angles of concrete and holes in the ground. Pt has service dogs that are retired, one for guidance and one for helping with picking up and retrieving items. Pt had most recent stroke in January 2023 that further limited her field of vision.  Patient wants to work diligently on her strength, balance, mobility, and in order to improve her ability to ambulate on various surfaces in various scenarios.  Patient wanted to come to outpatient services to utilize equipment to improve her strength and a greater capacity than home health was able to assist her with.  She is doing home exercise program involving mostly standing with upper extremity support and activities to improve her strength.  Patient reports she is very nervous to ambulate without contact-guard assist and gait belt without being able to hold onto a wall or steady surface in order to provide her with tactile cues for the areas around her.  PAIN:  Are you having pain? None  PRECAUTIONS: Fall, hemianopsia  WEIGHT BEARING RESTRICTIONS: No  FALLS: Has patient fallen in last 6 months? No  PATIENT GOALS: Improve gait, improve strength, walk on unsteady surfaces  OBJECTIVE:   TODAY'S TREATMENT:                                                                                                                              DATE: 09/24/22  Unless otherwise stated, CGA was provided and gait belt donned in order to ensure pt safety.   TA Ambulation with 3WW from // bars to elevators, SBA for this, then pt transported in [facility] to healing garden.   Ambulation with 3WW in healing garden x 12 min on sidewalk with  various turns throughout. Pt challenged with obstacle navigation and with different grades in sidewalk, even slight grade change was noticeable in ambulation speed and quality.   STS no UE support x 10 reps  -cues for eccentric control with stand to sit  The following activities were completed in parallel bars  X 4 rounds of ambulation over red mat in // bars Seated rest    PATIENT EDUCATION: Education details:Fear of falling and how this is a natural response and  feeling toward higher level interventions.  Person educated: Patient Education method: Explanation Education comprehension: verbalized understanding  HOME EXERCISE PROGRAM:   Access Code: [email or web address] URL: [email or web address] Date: 07/31/2022 Prepared by: Grier Rocher  Exercises - Standing Tandem Balance with Counter Support  - 1 x daily - 3 x weekly - 2 sets - 2 reps - 10 hold - Standing Single Leg Stance with Counter Support  - 1 x daily - 3 x weekly - 2 sets - 2 reps - 5 hold  Updated 05/29/2022: Access Code: [email or web address] URL: [email or web address] Date: 05/29/2022 Prepared by: Temple Pacini  Exercises - Seated March  - 1 x daily - 5 x weekly - 3 sets - 10 reps - Seated Long Arc Quad  - 1 x daily - 7 x weekly - 3 sets - 10 reps -written on handout: seated core twists with holding YTB or RTB 5x weekly 3x10 each side  Previous: Pt performs leg lifts and squats with UE support    GOALS: Goals reviewed with patient? Yes  SHORT TERM GOALS: Target date: 06/24/2022    Patient will be independent in home exercise program to improve strength/mobility for better functional independence with ADLs. Baseline: No HEP currently  Goal status: INITIAL  LONG TERM GOALS: Target date: 08/19/2022  1.  Patient (> 52 years old) will complete five times sit to stand test in < 15 seconds indicating an increased LE strength and improved balance. Baseline:  Test second visit ; 05/29/2022: 40 sec pull to stance  RUE 07/10/22: 22.82 sec. 08/18/22: 16.36 sec with UE support Goal status: ONGOING  2.  Patient will increase FOTO score by 8 or more points  to demonstrate statistically significant improvement in mobility and quality of life.  Baseline: Test second visit; 05/28/2040: 45; 07/10/22: 41, 08/18/22: 45  Goal status: ONGOING  3.  Patient will increase 10 meter walk test to >.44m/s as to improve gait speed for better community ambulation and to reduce fall risk. Baseline: Test visit 2 06/19/22: 53 sec 5/16: 77 sec. 08/18/22: 26.43 sec done with 3-wheel walker. Goal status: ONGOING   4. Patient will improve ability to ambulate on uneven surfaces as evidenced by ability to ambulate over simulated uneven terrain in clinic to improve independence with ambulation. Baseline: Patient not ambulating on uneven terrain. 08/18/22: pt ambulated across blue tri-fold PT mat using forearm cane, cane on ground next to mat. Two attempts done, pt hesitant and nervous for 1st attempt, ability to navigate over the mat improved upon 2nd attempt. Goal status: ONGOING   ASSESSMENT:  CLINICAL IMPRESSION:  Patient appeared motivated and ready for treatment on this day.  Pt makes progress with ambulation over compliant surfaces of red mat and also makes progress with ambulation with 3WW outdoors. No overt LOB this date, pt confidence increasing with red mat due to // bars.  Pt will continue to benefit from skilled physical therapy intervention to address impairments, improve QOL, and attain therapy goals.     OBJECTIVE IMPAIRMENTS: Abnormal gait, decreased activity tolerance, decreased balance, decreased endurance, decreased strength, decreased safety awareness, hypomobility, impaired flexibility, and impaired UE functional use.   ACTIVITY LIMITATIONS: lifting, bending, standing, stairs, transfers, bed mobility, and locomotion level  PARTICIPATION LIMITATIONS: meal prep, cleaning, shopping, and community activity  PERSONAL  FACTORS: Time since onset of injury/illness/exacerbation and 3+ comorbidities: diabetes, HTN, Multiple previous strokes   are also affecting patient's functional outcome.   REHAB POTENTIAL: Fair secondary to time since onset and severely of visual deficits and  L UE funcitonal use   CLINICAL DECISION MAKING: Stable/uncomplicated  EVALUATION COMPLEXITY: Low  PLAN:  PT FREQUENCY: 2x/week  PT DURATION: 12 weeks  PLANNED INTERVENTIONS: Therapeutic exercises, Therapeutic activity, Neuromuscular re-education, Balance training, Gait training, Patient/Family education, Self Care, Joint mobilization, Stair training, Moist heat, Manual therapy, and Re-evaluation  PLAN FOR NEXT SESSION:  Gait training on gravel and in parking lot to simulate home and community.  Eyes closed balance to harness vestibular and proprioceptive senses.   Norman Herrlich PT ,DPT Physical Therapist- [facility]   2:13 PM 09/24/22

## 2022-09-25 ENCOUNTER — Other Ambulatory Visit: Payer: Self-pay

## 2022-09-25 DIAGNOSIS — M21372 Foot drop, left foot: Secondary | ICD-10-CM | POA: Diagnosis not present

## 2022-09-26 ENCOUNTER — Other Ambulatory Visit: Payer: Self-pay

## 2022-09-26 MED ORDER — TECHLITE PLUS PEN NEEDLES 32G X 4 MM MISC
3 refills | Status: DC
  Filled 2022-09-26 – 2022-10-02 (×2): qty 100, 100d supply, fill #0

## 2022-09-29 ENCOUNTER — Encounter: Payer: Self-pay | Admitting: Physical Therapy

## 2022-09-29 ENCOUNTER — Ambulatory Visit: Payer: PPO | Attending: Neurology | Admitting: Physical Therapy

## 2022-09-29 DIAGNOSIS — R278 Other lack of coordination: Secondary | ICD-10-CM | POA: Diagnosis not present

## 2022-09-29 DIAGNOSIS — R2689 Other abnormalities of gait and mobility: Secondary | ICD-10-CM | POA: Insufficient documentation

## 2022-09-29 DIAGNOSIS — M6281 Muscle weakness (generalized): Secondary | ICD-10-CM | POA: Insufficient documentation

## 2022-09-29 DIAGNOSIS — R2681 Unsteadiness on feet: Secondary | ICD-10-CM | POA: Diagnosis not present

## 2022-09-29 DIAGNOSIS — R269 Unspecified abnormalities of gait and mobility: Secondary | ICD-10-CM | POA: Insufficient documentation

## 2022-09-29 DIAGNOSIS — R262 Difficulty in walking, not elsewhere classified: Secondary | ICD-10-CM | POA: Diagnosis not present

## 2022-09-29 NOTE — Therapy (Signed)
OUTPATIENT PHYSICAL THERAPY NEURO TREATMENT/Physical Therapy Progress Note   Dates of reporting period  08/20/22   to   09/29/22     Patient Name: Rosyln Rapoza MRN: [id number] DOB:08/15/1950, 72 y.o., female Today's Date: 09/29/2022   PCP:  Enid Baas, MD   REFERRING PROVIDER: Morene Crocker, MD   END OF SESSION:  PT End of Session - 09/29/22 1303     Visit Number 30    Number of Visits 44    Date for PT Re-Evaluation 11/10/22    Authorization Type [employer]    Progress Note Due on Visit 30    PT Start Time 1315    PT Stop Time 1357    PT Time Calculation (min) 42 min    Equipment Utilized During Treatment Gait belt    Activity Tolerance Patient tolerated treatment well;No increased pain    Behavior During Therapy [facility] for tasks assessed/performed                         Past Medical History:  Diagnosis Date   Abdominal pain 12/06/2013   Abnormality of gait 01/26/2013   Altered bowel habits 12/11/2017   Bruises easily    Constipation    Crohn's disease (HCC)    Depression    Dizziness and giddiness 01/26/2013   Fecal incontinence alternating with constipation 12/11/2017   GERD (gastroesophageal reflux disease)    Hemianopia    left   Hemiparesis and alteration of sensations as late effects of stroke (HCC) 01/26/2013   Hyperlipidemia    Hypertension    Hypothyroidism    Lump in female breast    Panic disorder    PONV (postoperative nausea and vomiting)    Stroke (HCC) 2009   Tremor, essential 04/24/2020   Type II diabetes mellitus (HCC)    Past Surgical History:  Procedure Laterality Date   ANAL FISSURE REPAIR  2008   APPENDECTOMY  1986   BREAST BIOPSY Left 2006   neg   BREAST LUMPECTOMY Right 1990's   CESAREAN SECTION  1981; 1983   CHOLECYSTECTOMY  1986   SMALL INTESTINE SURGERY  05/1991   TUBAL LIGATION  1983   Patient Active Problem List   Diagnosis Date Noted   Bone spur 06/13/2021   Acute CVA  (cerebrovascular accident) (HCC) 03/07/2021   CVA (cerebral vascular accident) (HCC) 11/01/2020   Chronic pain 04/25/2020   Chronic constipation 04/25/2020   Gastro-esophageal reflux disease without esophagitis 04/25/2020   Hardening of the aorta (main artery of the heart) (HCC) 04/25/2020   History of iron deficiency anemia 04/25/2020   Hypercholesteremia 04/25/2020   Hypertension 04/25/2020   Personal history of transient ischemic attack (TIA), and cerebral infarction without residual deficits 04/25/2020   Pulmonary emphysema (HCC) 04/25/2020   Vitamin D deficiency 04/25/2020   Tremor, essential 04/24/2020   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   Right lower quadrant abdominal pain 08/18/2018   Spastic hemiplegia affecting nondominant side (HCC) 02/03/2014   Diabetes mellitus (HCC) 09/07/2013   Hypothyroidism 09/07/2013   Type 2 diabetes mellitus without complications (HCC) 09/07/2013   Hemiparesis and alteration of sensations as late effects of stroke (HCC) 01/26/2013   Unsteady gait 01/26/2013   Dizziness and giddiness 01/26/2013    ONSET DATE: 2009  REFERRING DIAG: Z86.73 (ICD-10-CM) - History of stroke   THERAPY DIAG:  Difficulty in walking, not elsewhere classified  Unsteadiness on feet  Other abnormalities of gait and mobility  Abnormality of  gait and mobility  Muscle weakness (generalized)  Rationale for Evaluation and Treatment: Rehabilitation  SUBJECTIVE:                                                                                                                                                                                             SUBJECTIVE STATEMENT:   Pt reports she got the new AFO and it is going so so a this time. Thinks developing bruise on high pressure area. Has been following progressive wear schedule from orthotist.  She reports sometimes it hurts and she in unsure if she has it donned correctly.   Pt accompanied by:  self  PERTINENT HISTORY: Pt has had one major stroke and 5 mini strokes since 2009. Pt is here since her doctor wants her to work on her strength and balance and she also wants to improve in these categories. Pt has hemianopsia with blindness inferior, left side and superior. Pt initially had stroke in 2009 and was previously able to walk for long distances. Pt has 650 feet driveway that is gravel and used to walk up and down with the cane. Pt has issues with depth perception. Pt is unable to tell angles of concrete and holes in the ground. Pt has service dogs that are retired, one for guidance and one for helping with picking up and retrieving items. Pt had most recent stroke in January 2023 that further limited her field of vision.  Patient wants to work diligently on her strength, balance, mobility, and in order to improve her ability to ambulate on various surfaces in various scenarios.  Patient wanted to come to outpatient services to utilize equipment to improve her strength and a greater capacity than home health was able to assist her with.  She is doing home exercise program involving mostly standing with upper extremity support and activities to improve her strength.  Patient reports she is very nervous to ambulate without contact-guard assist and gait belt without being able to hold onto a wall or steady surface in order to provide her with tactile cues for the areas around her.  PAIN:  Are you having pain? None  PRECAUTIONS: Fall, hemianopsia  WEIGHT BEARING RESTRICTIONS: No  FALLS: Has patient fallen in last 6 months? No  PATIENT GOALS: Improve gait, improve strength, walk on unsteady surfaces  OBJECTIVE:   TODAY'S TREATMENT:  DATE: 09/29/22  Unless otherwise stated, CGA was provided and gait belt donned in order to ensure pt safety.   Physical therapy  treatment session today consisted of completing assessment of goals and administration of testing as demonstrated and documented in flow sheet, treatment, and goals section of this note. Addition treatments may be found below.   Ambulation trials with some strap adjustments to AFO -tightened bottom strap, no improvement with pain. X 60 feet bouts x 3 rounds. Most improvement with soft foam placed superior to lateral maleoli on the L side.  Patient provided with extra foam to go home with and was instructed in how to apply foam to target area.  Did not attach foam to AFO but simply placed on high-pressure area at this time.  Seated LAQ 2 x 10 ea LE   PATIENT EDUCATION: Education details:Fear of falling and how this is a natural response and feeling toward higher level interventions.  Person educated: Patient Education method: Explanation Education comprehension: verbalized understanding  HOME EXERCISE PROGRAM:   Access Code: [email or web address] URL: [email or web address] Date: 07/31/2022 Prepared by: Grier Rocher  Exercises - Standing Tandem Balance with Counter Support  - 1 x daily - 3 x weekly - 2 sets - 2 reps - 10 hold - Standing Single Leg Stance with Counter Support  - 1 x daily - 3 x weekly - 2 sets - 2 reps - 5 hold  Updated 05/29/2022: Access Code: [email or web address] URL: [email or web address] Date: 05/29/2022 Prepared by: Temple Pacini  Exercises - Seated March  - 1 x daily - 5 x weekly - 3 sets - 10 reps - Seated Long Arc Quad  - 1 x daily - 7 x weekly - 3 sets - 10 reps -written on handout: seated core twists with holding YTB or RTB 5x weekly 3x10 each side  Previous: Pt performs leg lifts and squats with UE support    GOALS: Goals reviewed with patient? Yes  SHORT TERM GOALS: Target date: 06/24/2022    Patient will be independent in home exercise program to improve strength/mobility for better functional independence with ADLs. Baseline: No HEP currently   Goal status: INITIAL  LONG TERM GOALS: Target date: 08/19/2022  1.  Patient (> 34 years old) will complete five times sit to stand test in < 15 seconds indicating an increased LE strength and improved balance. Baseline:  Test second visit ; 05/29/2022: 40 sec pull to stance RUE 07/10/22: 22.82 sec. 08/18/22: 16.36 sec with UE support 09/29/22:19.32 sec, 42 sec without UE assist  Goal status: ONGOING  2.  Patient will increase FOTO score by 8 or more points  to demonstrate statistically significant improvement in mobility and quality of life.  Baseline: Test second visit; 05/28/2040: 45; 07/10/22: 41, 08/18/22: 45 8/5:48 Goal status: ONGOING  3.  Patient will increase 10 meter walk test to >.69m/s as to improve gait speed for better community ambulation and to reduce fall risk. Baseline: Test visit 2 06/19/22: 53 sec 5/16: 77 sec. 08/18/22: 26.43 sec done with 3-wheel walker. 8/5:46 sec with SPC, 33 sec with RW Goal status: ONGOING   4. Patient will improve ability to ambulate on uneven surfaces as evidenced by ability to ambulate over simulated uneven terrain in clinic to improve independence with ambulation. Baseline: Patient not ambulating on uneven terrain. 08/18/22: pt ambulated across blue tri-fold PT mat using forearm cane, cane on ground next to mat. Two attempts done, pt hesitant and nervous for 1st attempt, ability to navigate over the mat improved  upon 2nd attempt. 8/5: not assessed secondary to getting new brace and not being comfortable with it at this time.  Goal status: ONGOING   ASSESSMENT:  CLINICAL IMPRESSION:  Pt presents to PT for progress note this date. Pt shows some progress toward goals but may be limited with progress since today is first session with new AFO. Pt having some pain superior to her lateral maleoli on the left side which may be impacting her gait speed and mechanics with ambulation based goals.  Patient was given time to adjust to new AFO and if adjustment is not  successful we will go back to orthotist for follow-up. Patient's condition has the potential to improve in response to therapy. Maximum improvement is yet to be obtained. The anticipated improvement is attainable and reasonable in a generally predictable time.  Pt will continue to benefit from skilled physical therapy intervention to address impairments, improve QOL, and attain therapy goals.      OBJECTIVE IMPAIRMENTS: Abnormal gait, decreased activity tolerance, decreased balance, decreased endurance, decreased strength, decreased safety awareness, hypomobility, impaired flexibility, and impaired UE functional use.   ACTIVITY LIMITATIONS: lifting, bending, standing, stairs, transfers, bed mobility, and locomotion level  PARTICIPATION LIMITATIONS: meal prep, cleaning, shopping, and community activity  PERSONAL FACTORS: Time since onset of injury/illness/exacerbation and 3+ comorbidities: diabetes, HTN, Multiple previous strokes   are also affecting patient's functional outcome.   REHAB POTENTIAL: Fair secondary to time since onset and severely of visual deficits and L UE funcitonal use   CLINICAL DECISION MAKING: Stable/uncomplicated  EVALUATION COMPLEXITY: Low  PLAN:  PT FREQUENCY: 2x/week  PT DURATION: 12 weeks  PLANNED INTERVENTIONS: Therapeutic exercises, Therapeutic activity, Neuromuscular re-education, Balance training, Gait training, Patient/Family education, Self Care, Joint mobilization, Stair training, Moist heat, Manual therapy, and Re-evaluation  PLAN FOR NEXT SESSION:   Gait training on gravel and in parking lot to simulate home and community.  Eyes closed balance to harness vestibular and proprioceptive senses. Progression to ambulation on red mat.    Norman Herrlich PT ,DPT Physical Therapist- [place]  [facility]   1:32 PM 09/29/22

## 2022-09-30 DIAGNOSIS — M25541 Pain in joints of right hand: Secondary | ICD-10-CM | POA: Diagnosis not present

## 2022-09-30 DIAGNOSIS — M65331 Trigger finger, right middle finger: Secondary | ICD-10-CM | POA: Diagnosis not present

## 2022-09-30 DIAGNOSIS — M65341 Trigger finger, right ring finger: Secondary | ICD-10-CM | POA: Diagnosis not present

## 2022-09-30 DIAGNOSIS — M1811 Unilateral primary osteoarthritis of first carpometacarpal joint, right hand: Secondary | ICD-10-CM | POA: Diagnosis not present

## 2022-10-01 ENCOUNTER — Ambulatory Visit: Payer: PPO | Admitting: Physical Therapy

## 2022-10-01 ENCOUNTER — Other Ambulatory Visit: Payer: Self-pay

## 2022-10-02 ENCOUNTER — Other Ambulatory Visit: Payer: Self-pay

## 2022-10-03 ENCOUNTER — Other Ambulatory Visit: Payer: Self-pay

## 2022-10-03 MED ORDER — GUARDIAN SENSOR 3 MISC
11 refills | Status: DC
Start: 2022-10-03 — End: 2023-09-11
  Filled 2022-10-03: qty 3, 30d supply, fill #0
  Filled 2022-10-27: qty 3, 30d supply, fill #1
  Filled 2022-12-08: qty 3, 30d supply, fill #2
  Filled 2023-01-05: qty 3, 30d supply, fill #3
  Filled 2023-02-14: qty 3, 30d supply, fill #4
  Filled 2023-03-14 – 2023-03-23 (×3): qty 3, 30d supply, fill #5

## 2022-10-06 ENCOUNTER — Other Ambulatory Visit: Payer: Self-pay

## 2022-10-06 ENCOUNTER — Ambulatory Visit: Payer: PPO | Admitting: Physical Therapy

## 2022-10-06 DIAGNOSIS — R2681 Unsteadiness on feet: Secondary | ICD-10-CM

## 2022-10-06 DIAGNOSIS — R2689 Other abnormalities of gait and mobility: Secondary | ICD-10-CM

## 2022-10-06 DIAGNOSIS — R262 Difficulty in walking, not elsewhere classified: Secondary | ICD-10-CM

## 2022-10-06 DIAGNOSIS — R269 Unspecified abnormalities of gait and mobility: Secondary | ICD-10-CM

## 2022-10-06 NOTE — Therapy (Signed)
OUTPATIENT PHYSICAL THERAPY NEURO TREATMENT/Physical Therapy Progress Note   Dates of reporting period  08/20/22   to   09/29/22     Patient Name: Marsena Bondurant MRN: [id number] DOB:[birth year], 72 y.o., female Today's Date: 10/06/2022   PCP:  Enid Baas, MD   REFERRING PROVIDER: Morene Crocker, MD   END OF SESSION:  PT End of Session - 10/06/22 1439     Visit Number 31    Number of Visits 44    Date for PT Re-Evaluation 11/10/22    Authorization Type [employer]    Progress Note Due on Visit 40    PT Start Time 1445    PT Stop Time 1528    PT Time Calculation (min) 43 min    Equipment Utilized During Treatment Gait belt    Activity Tolerance Patient tolerated treatment well;No increased pain    Behavior During Therapy [facility] for tasks assessed/performed                         Past Medical History:  Diagnosis Date   Abdominal pain 12/06/2013   Abnormality of gait 01/26/2013   Altered bowel habits 12/11/2017   Bruises easily    Constipation    Crohn's disease (HCC)    Depression    Dizziness and giddiness 01/26/2013   Fecal incontinence alternating with constipation 12/11/2017   GERD (gastroesophageal reflux disease)    Hemianopia    left   Hemiparesis and alteration of sensations as late effects of stroke (HCC) 01/26/2013   Hyperlipidemia    Hypertension    Hypothyroidism    Lump in female breast    Panic disorder    PONV (postoperative nausea and vomiting)    Stroke (HCC) 2009   Tremor, essential 04/24/2020   Type II diabetes mellitus (HCC)    Past Surgical History:  Procedure Laterality Date   ANAL FISSURE REPAIR  2008   APPENDECTOMY  1986   BREAST BIOPSY Left 2006   neg   BREAST LUMPECTOMY Right 1990's   CESAREAN SECTION  1981; 1983   CHOLECYSTECTOMY  1986   SMALL INTESTINE SURGERY  05/1991   TUBAL LIGATION  1983   Patient Active Problem List   Diagnosis Date Noted   Bone spur 06/13/2021   Acute CVA  (cerebrovascular accident) (HCC) 03/07/2021   CVA (cerebral vascular accident) (HCC) 11/01/2020   Chronic pain 04/25/2020   Chronic constipation 04/25/2020   Gastro-esophageal reflux disease without esophagitis 04/25/2020   Hardening of the aorta (main artery of the heart) (HCC) 04/25/2020   History of iron deficiency anemia 04/25/2020   Hypercholesteremia 04/25/2020   Hypertension 04/25/2020   Personal history of transient ischemic attack (TIA), and cerebral infarction without residual deficits 04/25/2020   Pulmonary emphysema (HCC) 04/25/2020   Vitamin D deficiency 04/25/2020   Tremor, essential 04/24/2020   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  Rationale for Evaluation and Treatment: Rehabilitation  SUBJECTIVE:  SUBJECTIVE STATEMENT:   Pt reports she got the new AFO and it is going so so a this time. Thinks developing bruise on high pressure area. Has been following progressive wear schedule from orthotist.  She reports sometimes it hurts and she in unsure if she has it donned correctly.   Pt accompanied by: self  PERTINENT HISTORY: Pt has had one major stroke and 5 mini strokes since 2009. Pt is here since her doctor wants her to work on her strength and balance and she also wants to  improve in these categories. Pt has hemianopsia with blindness inferior, left side and superior. Pt initially had stroke in 2009 and was previously able to walk for long distances. Pt has 650 feet driveway that is gravel and used to walk up and down with the cane. Pt has issues with depth perception. Pt is unable to tell angles of concrete and holes in the ground. Pt has service dogs that are retired, one for guidance and one for helping with picking up and retrieving items. Pt had most recent stroke in January 2023 that further limited her field of vision.  Patient wants to work diligently on her strength, balance, mobility, and in order to improve her ability to ambulate on various surfaces in various scenarios.  Patient wanted to come to outpatient services to utilize equipment to improve her strength and a greater capacity than home health was able to assist her with.  She is doing home exercise program involving mostly standing with upper extremity support and activities to improve her strength.  Patient reports she is very nervous to ambulate without contact-guard assist and gait belt without being able to hold onto a wall or steady surface in order to provide her with tactile cues for the areas around her.  PAIN:  Are you having pain? None  PRECAUTIONS: Fall, hemianopsia  WEIGHT BEARING RESTRICTIONS: No  FALLS: Has patient fallen in last 6 months? No  PATIENT GOALS: Improve gait, improve strength, walk on unsteady surfaces  OBJECTIVE:   TODAY'S TREATMENT:                                                                                                                              DATE: 10/06/22  Unless otherwise stated, CGA was provided and gait belt donned in order to ensure pt safety  Ambulated outside with SPC x 2 bouts for a total of 28 min including the below transitions and exercises.  - STS from standard armchair in healing garden -STS x 5 from standard park bench in healing  garden - transferred to [facility] and transported inside  X 10 reps ea LE LAQ with 3# AW    PATIENT EDUCATION: Education details:Fear of falling and how this is a natural response and feeling toward higher level interventions.  Person educated: Patient Education method: Explanation Education comprehension: verbalized understanding  HOME EXERCISE PROGRAM:   Access Code: [email or web address] URL: [email or web address] Date: 07/31/2022 Prepared by:  Grier Rocher  Exercises - Standing Tandem Balance with Counter Support  - 1 x daily - 3 x weekly - 2 sets - 2 reps - 10 hold - Standing Single Leg Stance with Counter Support  - 1 x daily - 3 x weekly - 2 sets - 2 reps - 5 hold  Updated 05/29/2022: Access Code: [email or web address] URL: [email or web address] Date: 05/29/2022 Prepared by: Temple Pacini  Exercises - Seated March  - 1 x daily - 5 x weekly - 3 sets - 10 reps - Seated Long Arc Quad  - 1 x daily - 7 x weekly - 3 sets - 10 reps -written on handout: seated core twists with holding YTB or RTB 5x weekly 3x10 each side  Previous: Pt performs leg lifts and squats with UE support    GOALS: Goals reviewed with patient? Yes  SHORT TERM GOALS: Target date: 06/24/2022    Patient will be independent in home exercise program to improve strength/mobility for better functional independence with ADLs. Baseline: No HEP currently  Goal status: INITIAL  LONG TERM GOALS: Target date: 08/19/2022  1.  Patient (> 26 years old) will complete five times sit to stand test in < 15 seconds indicating an increased LE strength and improved balance. Baseline:  Test second visit ; 05/29/2022: 40 sec pull to stance RUE 07/10/22: 22.82 sec. 08/18/22: 16.36 sec with UE support 09/29/22:19.32 sec, 42 sec without UE assist  Goal status: ONGOING  2.  Patient will increase FOTO score by 8 or more points  to demonstrate statistically significant improvement in mobility and quality of life.  Baseline: Test second  visit; 05/28/2040: 45; 07/10/22: 41, 08/18/22: 45 8/5:48 Goal status: ONGOING  3.  Patient will increase 10 meter walk test to >.51m/s as to improve gait speed for better community ambulation and to reduce fall risk. Baseline: Test visit 2 06/19/22: 53 sec 5/16: 77 sec. 08/18/22: 26.43 sec done with 3-wheel walker. 8/5:46 sec with SPC, 33 sec with RW Goal status: ONGOING   4. Patient will improve ability to ambulate on uneven surfaces as evidenced by ability to ambulate over simulated uneven terrain in clinic to improve independence with ambulation. Baseline: Patient not ambulating on uneven terrain. 08/18/22: pt ambulated across blue tri-fold PT mat using forearm cane, cane on ground next to mat. Two attempts done, pt hesitant and nervous for 1st attempt, ability to navigate over the mat improved upon 2nd attempt. 8/5: not assessed secondary to getting new brace and not being comfortable with it at this time.  Goal status: ONGOING   ASSESSMENT:  CLINICAL IMPRESSION:  Pt presents with good motivation from completion of PT interventions. Pt ambulates outside across sidewalk gradients as well as weaving around obstacles. Pt has increase fatigue this date compared to other sessions which she reports may be a result of her week of family being in town and her activity level being lower than normal. Pt will continue to benefit from skilled physical therapy intervention to address impairments, improve QOL, and attain therapy goals.       OBJECTIVE IMPAIRMENTS: Abnormal gait, decreased activity tolerance, decreased balance, decreased endurance, decreased strength, decreased safety awareness, hypomobility, impaired flexibility, and impaired UE functional use.   ACTIVITY LIMITATIONS: lifting, bending, standing, stairs, transfers, bed mobility, and locomotion level  PARTICIPATION LIMITATIONS: meal prep, cleaning, shopping, and community activity  PERSONAL FACTORS: Time since onset of  injury/illness/exacerbation and 3+ comorbidities: diabetes, HTN, Multiple previous strokes   are also affecting patient's functional outcome.   REHAB  POTENTIAL: Fair secondary to time since onset and severely of visual deficits and L UE funcitonal use   CLINICAL DECISION MAKING: Stable/uncomplicated  EVALUATION COMPLEXITY: Low  PLAN:  PT FREQUENCY: 2x/week  PT DURATION: 12 weeks  PLANNED INTERVENTIONS: Therapeutic exercises, Therapeutic activity, Neuromuscular re-education, Balance training, Gait training, Patient/Family education, Self Care, Joint mobilization, Stair training, Moist heat, Manual therapy, and Re-evaluation  PLAN FOR NEXT SESSION:   Gait training on gravel and in parking lot to simulate home and community.  Eyes closed balance to harness vestibular and proprioceptive senses. Progression to ambulation on red mat.    Norman Herrlich PT ,DPT Physical Therapist- [place]  [facility]   2:42 PM 10/06/22

## 2022-10-07 ENCOUNTER — Other Ambulatory Visit: Payer: Self-pay

## 2022-10-08 ENCOUNTER — Ambulatory Visit: Payer: PPO | Admitting: Physical Therapy

## 2022-10-08 ENCOUNTER — Other Ambulatory Visit: Payer: Self-pay

## 2022-10-08 DIAGNOSIS — R2689 Other abnormalities of gait and mobility: Secondary | ICD-10-CM

## 2022-10-08 DIAGNOSIS — R269 Unspecified abnormalities of gait and mobility: Secondary | ICD-10-CM

## 2022-10-08 DIAGNOSIS — R262 Difficulty in walking, not elsewhere classified: Secondary | ICD-10-CM

## 2022-10-08 DIAGNOSIS — R2681 Unsteadiness on feet: Secondary | ICD-10-CM

## 2022-10-08 NOTE — Therapy (Signed)
OUTPATIENT PHYSICAL THERAPY NEURO TREATMENT     Patient Name: [name] MRN: [id number] DOB:11-07-1950, 72 y.o., female Today's Date: 10/08/2022   PCP:  Enid Baas, MD   REFERRING PROVIDER: Morene Crocker, MD   END OF SESSION:  PT End of Session - 10/08/22 1434     Visit Number 33    Number of Visits 44    Date for PT Re-Evaluation 11/10/22    Authorization Type [employer]    Progress Note Due on Visit 40    PT Start Time 1358    PT Stop Time 1443    PT Time Calculation (min) 45 min    Equipment Utilized During Treatment Gait belt    Activity Tolerance Patient tolerated treatment well;No increased pain    Behavior During Therapy [facility] for tasks assessed/performed                          Past Medical History:  Diagnosis Date   Abdominal pain 12/06/2013   Abnormality of gait 01/26/2013   Altered bowel habits 12/11/2017   Bruises easily    Constipation    Crohn's disease (HCC)    Depression    Dizziness and giddiness 01/26/2013   Fecal incontinence alternating with constipation 12/11/2017   GERD (gastroesophageal reflux disease)    Hemianopia    left   Hemiparesis and alteration of sensations as late effects of stroke (HCC) 01/26/2013   Hyperlipidemia    Hypertension    Hypothyroidism    Lump in female breast    Panic disorder    PONV (postoperative nausea and vomiting)    Stroke (HCC) 2009   Tremor, essential 04/24/2020   Type II diabetes mellitus (HCC)    Past Surgical History:  Procedure Laterality Date   ANAL FISSURE REPAIR  2008   APPENDECTOMY  1986   BREAST BIOPSY Left 2006   neg   BREAST LUMPECTOMY Right 1990's   CESAREAN SECTION  1981; 1983   CHOLECYSTECTOMY  1986   SMALL INTESTINE SURGERY  05/1991   TUBAL LIGATION  1983   Patient Active Problem List   Diagnosis Date Noted   Bone spur 06/13/2021   Acute CVA (cerebrovascular accident) (HCC) 03/07/2021   CVA (cerebral vascular accident) (HCC) 11/01/2020    Chronic pain 04/25/2020   Chronic constipation 04/25/2020   Gastro-esophageal reflux disease without esophagitis 04/25/2020   Hardening of the aorta (main artery of the heart) (HCC) 04/25/2020   History of iron deficiency anemia 04/25/2020   Hypercholesteremia 04/25/2020   Hypertension 04/25/2020   Personal history of transient ischemic attack (TIA), and cerebral infarction without residual deficits 04/25/2020   Pulmonary emphysema (HCC) 04/25/2020   Vitamin D deficiency 04/25/2020   Tremor, essential 04/24/2020   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   Right lower quadrant abdominal pain 08/18/2018   Spastic hemiplegia affecting nondominant side (HCC) 02/03/2014   Diabetes mellitus (HCC) 09/07/2013   Hypothyroidism 09/07/2013   Type 2 diabetes mellitus without complications (HCC) 09/07/2013   Hemiparesis and alteration of sensations as late effects of stroke (HCC) 01/26/2013   Unsteady gait 01/26/2013   Dizziness and giddiness 01/26/2013    ONSET DATE: 2009  REFERRING DIAG: Z86.73 (ICD-10-CM) - History of stroke   THERAPY DIAG:  Difficulty in walking, not elsewhere classified  Other abnormalities of gait and mobility  Unsteadiness on feet  Abnormality of gait and mobility  Rationale for Evaluation and Treatment: Rehabilitation  SUBJECTIVE:  SUBJECTIVE STATEMENT:   Pt reports doing well today. Pt denies any recent falls/stumbles since prior session. Pt denies any updates to medications or medical appointment since prior session. Pt reports good compliance with HEP when time permits.    Pt accompanied by: self  PERTINENT HISTORY: Pt has had one major stroke and 5 mini strokes since 2009. Pt is here since her doctor wants her to work on her strength and balance and she also wants  to improve in these categories. Pt has hemianopsia with blindness inferior, left side and superior. Pt initially had stroke in 2009 and was previously able to walk for long distances. Pt has 650 feet driveway that is gravel and used to walk up and down with the cane. Pt has issues with depth perception. Pt is unable to tell angles of concrete and holes in the ground. Pt has service dogs that are retired, one for guidance and one for helping with picking up and retrieving items. Pt had most recent stroke in January 2023 that further limited her field of vision.  Patient wants to work diligently on her strength, balance, mobility, and in order to improve her ability to ambulate on various surfaces in various scenarios.  Patient wanted to come to outpatient services to utilize equipment to improve her strength and a greater capacity than home health was able to assist her with.  She is doing home exercise program involving mostly standing with upper extremity support and activities to improve her strength.  Patient reports she is very nervous to ambulate without contact-guard assist and gait belt without being able to hold onto a wall or steady surface in order to provide her with tactile cues for the areas around her.  PAIN:  Are you having pain? None  PRECAUTIONS: Fall, hemianopsia  WEIGHT BEARING RESTRICTIONS: No  FALLS: Has patient fallen in last 6 months? No  PATIENT GOALS: Improve gait, improve strength, walk on unsteady surfaces  OBJECTIVE:   TODAY'S TREATMENT:                                                                                                                              DATE: 10/08/22 TA  Unless otherwise stated, CGA was provided and gait belt donned in order to ensure pt safety  Ambulated in // bars forward and retro gait x10 extensions    Ambulated in // bars over small objects ( canes) x 8 extensions   Ambulated over red mat x 10 extensions x 3 extensions attempting no  UE support, good tolerance but does require intermittent UE support and close CGA for safety.   STS x 10 with no UE assist   STS with R LE elevated on 1 inch step to increase weightbearing and challenge on contralateral LE x 5, initially needed UE support in standing for balance but with practice was able to complete last 2 reps without UE assist.   Ambulation across red mat x 2 extensions  without UE assist, did not require UE on first round but required UE intermittently on return lap.   Pt instructed why need for practice of various surfaces and adapting to surfaces is important in her recovery and her overall mobility.    PATIENT EDUCATION: Education details:Fear of falling and how this is a natural response and feeling toward higher level interventions.  Person educated: Patient Education method: Explanation Education comprehension: verbalized understanding  HOME EXERCISE PROGRAM:   Access Code: [email or web address] URL: [email or web address] Date: 07/31/2022 Prepared by: Grier Rocher  Exercises - Standing Tandem Balance with Counter Support  - 1 x daily - 3 x weekly - 2 sets - 2 reps - 10 hold - Standing Single Leg Stance with Counter Support  - 1 x daily - 3 x weekly - 2 sets - 2 reps - 5 hold  Updated 05/29/2022: Access Code: [email or web address] URL: [email or web address] Date: 05/29/2022 Prepared by: Temple Pacini  Exercises - Seated March  - 1 x daily - 5 x weekly - 3 sets - 10 reps - Seated Long Arc Quad  - 1 x daily - 7 x weekly - 3 sets - 10 reps -written on handout: seated core twists with holding YTB or RTB 5x weekly 3x10 each side  Previous: Pt performs leg lifts and squats with UE support    GOALS: Goals reviewed with patient? Yes  SHORT TERM GOALS: Target date: 06/24/2022    Patient will be independent in home exercise program to improve strength/mobility for better functional independence with ADLs. Baseline: No HEP currently  Goal status:  INITIAL  LONG TERM GOALS: Target date: 08/19/2022  1.  Patient (> 61 years old) will complete five times sit to stand test in < 15 seconds indicating an increased LE strength and improved balance. Baseline:  Test second visit ; 05/29/2022: 40 sec pull to stance RUE 07/10/22: 22.82 sec. 08/18/22: 16.36 sec with UE support 09/29/22:19.32 sec, 42 sec without UE assist  Goal status: ONGOING  2.  Patient will increase FOTO score by 8 or more points  to demonstrate statistically significant improvement in mobility and quality of life.  Baseline: Test second visit; 05/28/2040: 45; 07/10/22: 41, 08/18/22: 45 8/5:48 Goal status: ONGOING  3.  Patient will increase 10 meter walk test to >.65m/s as to improve gait speed for better community ambulation and to reduce fall risk. Baseline: Test visit 2 06/19/22: 53 sec 5/16: 77 sec. 08/18/22: 26.43 sec done with 3-wheel walker. 8/5:46 sec with SPC, 33 sec with RW Goal status: ONGOING   4. Patient will improve ability to ambulate on uneven surfaces as evidenced by ability to ambulate over simulated uneven terrain in clinic to improve independence with ambulation. Baseline: Patient not ambulating on uneven terrain. 08/18/22: pt ambulated across blue tri-fold PT mat using forearm cane, cane on ground next to mat. Two attempts done, pt hesitant and nervous for 1st attempt, ability to navigate over the mat improved upon 2nd attempt. 8/5: not assessed secondary to getting new brace and not being comfortable with it at this time.  Goal status: ONGOING   ASSESSMENT:  CLINICAL IMPRESSION:  Pt presents with good motivation from completion of PT interventions. Pt works on increased volume of ambulation with various challenges including retro gait, ambulation over objects and ambulation on red compliant surface mat. Pt able to progress to ambulating several steps at a time on red mat without UE assist. Pt also showing progress with STS without UE assist. Pt will continue to benefit  from skilled  physical therapy intervention to address impairments, improve QOL, and attain therapy goals.       OBJECTIVE IMPAIRMENTS: Abnormal gait, decreased activity tolerance, decreased balance, decreased endurance, decreased strength, decreased safety awareness, hypomobility, impaired flexibility, and impaired UE functional use.   ACTIVITY LIMITATIONS: lifting, bending, standing, stairs, transfers, bed mobility, and locomotion level  PARTICIPATION LIMITATIONS: meal prep, cleaning, shopping, and community activity  PERSONAL FACTORS: Time since onset of injury/illness/exacerbation and 3+ comorbidities: diabetes, HTN, Multiple previous strokes   are also affecting patient's functional outcome.   REHAB POTENTIAL: Fair secondary to time since onset and severely of visual deficits and L UE funcitonal use   CLINICAL DECISION MAKING: Stable/uncomplicated  EVALUATION COMPLEXITY: Low  PLAN:  PT FREQUENCY: 2x/week  PT DURATION: 12 weeks  PLANNED INTERVENTIONS: Therapeutic exercises, Therapeutic activity, Neuromuscular re-education, Balance training, Gait training, Patient/Family education, Self Care, Joint mobilization, Stair training, Moist heat, Manual therapy, and Re-evaluation  PLAN FOR NEXT SESSION:   Gait training on gravel and in parking lot to simulate home and community.  Eyes closed balance to harness vestibular and proprioceptive senses. Progression to ambulation on red mat.    Norman Herrlich PT ,DPT Physical Therapist- [place]  [facility]   4:18 PM 10/08/22

## 2022-10-10 ENCOUNTER — Encounter: Payer: Self-pay | Admitting: Gastroenterology

## 2022-10-13 ENCOUNTER — Other Ambulatory Visit: Payer: Self-pay

## 2022-10-13 ENCOUNTER — Ambulatory Visit: Payer: PPO | Admitting: Physical Therapy

## 2022-10-13 ENCOUNTER — Encounter: Payer: Self-pay | Admitting: Gastroenterology

## 2022-10-13 DIAGNOSIS — R262 Difficulty in walking, not elsewhere classified: Secondary | ICD-10-CM

## 2022-10-13 DIAGNOSIS — M6281 Muscle weakness (generalized): Secondary | ICD-10-CM

## 2022-10-13 DIAGNOSIS — R2689 Other abnormalities of gait and mobility: Secondary | ICD-10-CM

## 2022-10-13 DIAGNOSIS — R2681 Unsteadiness on feet: Secondary | ICD-10-CM

## 2022-10-13 DIAGNOSIS — R269 Unspecified abnormalities of gait and mobility: Secondary | ICD-10-CM

## 2022-10-13 MED ORDER — METFORMIN HCL 500 MG PO TABS
500.0000 mg | ORAL_TABLET | Freq: Two times a day (BID) | ORAL | 6 refills | Status: DC
Start: 2022-10-12 — End: 2022-11-10
  Filled 2022-10-13 (×2): qty 60, 30d supply, fill #0

## 2022-10-13 NOTE — Therapy (Signed)
OUTPATIENT PHYSICAL THERAPY NEURO TREATMENT     Patient Name: [name] MRN: [id number] DOB:[birth year], 72 y.o., female Today's Date: 10/13/2022   PCP:  Enid Baas, MD   REFERRING PROVIDER: Morene Crocker, MD   END OF SESSION:  PT End of Session - 10/13/22 1500     Visit Number 34    Number of Visits 44    Date for PT Re-Evaluation 11/10/22    Authorization Type [employer]    Progress Note Due on Visit 40    PT Start Time 1446    PT Stop Time 1528    PT Time Calculation (min) 42 min    Equipment Utilized During Treatment Gait belt    Activity Tolerance Patient tolerated treatment well;No increased pain    Behavior During Therapy [facility] for tasks assessed/performed                           Past Medical History:  Diagnosis Date   Abdominal pain 12/06/2013   Abnormality of gait 01/26/2013   Altered bowel habits 12/11/2017   Bruises easily    Constipation    Crohn's disease (HCC)    Depression    Dizziness and giddiness 01/26/2013   Fecal incontinence alternating with constipation 12/11/2017   GERD (gastroesophageal reflux disease)    Hemianopia    left   Hemiparesis and alteration of sensations as late effects of stroke (HCC) 01/26/2013   Hyperlipidemia    Hypertension    Hypothyroidism    Lump in female breast    Panic disorder    PONV (postoperative nausea and vomiting)    Stroke (HCC) 2009   Tremor, essential 04/24/2020   Type II diabetes mellitus (HCC)    Past Surgical History:  Procedure Laterality Date   ANAL FISSURE REPAIR  2008   APPENDECTOMY  1986   BREAST BIOPSY Left 2006   neg   BREAST LUMPECTOMY Right 1990's   CESAREAN SECTION  1981; 1983   CHOLECYSTECTOMY  1986   SMALL INTESTINE SURGERY  05/1991   TUBAL LIGATION  1983   Patient Active Problem List   Diagnosis Date Noted   Bone spur 06/13/2021   Acute CVA (cerebrovascular accident) (HCC) 03/07/2021   CVA (cerebral vascular accident) (HCC)  11/01/2020   Chronic pain 04/25/2020   Chronic constipation 04/25/2020   Gastro-esophageal reflux disease without esophagitis 04/25/2020   Hardening of the aorta (main artery of the heart) (HCC) 04/25/2020   History of iron deficiency anemia 04/25/2020   Hypercholesteremia 04/25/2020   Hypertension 04/25/2020   Personal history of transient ischemic attack (TIA), and cerebral infarction without residual deficits 04/25/2020   Pulmonary emphysema (HCC) 04/25/2020   Vitamin D deficiency 04/25/2020   Tremor, essential 04/24/2020   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   Right lower quadrant abdominal pain 08/18/2018   Spastic hemiplegia affecting nondominant side (HCC) 02/03/2014   Diabetes mellitus (HCC) 09/07/2013   Hypothyroidism 09/07/2013   Type 2 diabetes mellitus without complications (HCC) 09/07/2013   Hemiparesis and alteration of sensations as late effects of stroke (HCC) 01/26/2013   Unsteady gait 01/26/2013   Dizziness and giddiness 01/26/2013    ONSET DATE: 2009  REFERRING DIAG: Z86.73 (ICD-10-CM) - History of stroke   THERAPY DIAG:  Difficulty in walking, not elsewhere classified  Other abnormalities of gait and mobility  Unsteadiness on feet  Abnormality of gait and mobility  Muscle weakness (generalized)  Rationale for Evaluation and Treatment: Rehabilitation  SUBJECTIVE:                                                                                                                                                                                             SUBJECTIVE STATEMENT:   Pt reports doing well today. Pt denies any recent falls/stumbles since prior session. Pt denies any updates to medications or medical appointment since prior session. Pt reports good compliance with HEP when time permits.    Pt accompanied by: self  PERTINENT HISTORY: Pt has had one major stroke and 5 mini strokes since 2009. Pt is here since her doctor wants her to work on  her strength and balance and she also wants to improve in these categories. Pt has hemianopsia with blindness inferior, left side and superior. Pt initially had stroke in 2009 and was previously able to walk for long distances. Pt has 650 feet driveway that is gravel and used to walk up and down with the cane. Pt has issues with depth perception. Pt is unable to tell angles of concrete and holes in the ground. Pt has service dogs that are retired, one for guidance and one for helping with picking up and retrieving items. Pt had most recent stroke in January 2023 that further limited her field of vision.  Patient wants to work diligently on her strength, balance, mobility, and in order to improve her ability to ambulate on various surfaces in various scenarios.  Patient wanted to come to outpatient services to utilize equipment to improve her strength and a greater capacity than home health was able to assist her with.  She is doing home exercise program involving mostly standing with upper extremity support and activities to improve her strength.  Patient reports she is very nervous to ambulate without contact-guard assist and gait belt without being able to hold onto a wall or steady surface in order to provide her with tactile cues for the areas around her.  PAIN:  Are you having pain? None  PRECAUTIONS: Fall, hemianopsia  WEIGHT BEARING RESTRICTIONS: No  FALLS: Has patient fallen in last 6 months? No  PATIENT GOALS: Improve gait, improve strength, walk on unsteady surfaces  OBJECTIVE:   TODAY'S TREATMENT:  DATE: 10/13/22 TA  Unless otherwise stated, CGA was provided and gait belt donned in order to ensure pt safety  Ambulated in // bars forward and retro gait x10 extensions    Ambulated in // bars over small objects (canes) x 8 extensions   Ambulated over red mat x 6  extensions in // bars   Ambulation outside // bars over red compliant surface mat x 2 extensions, step to pattern, cane on floor beside mat to add stability.   STS x 6 with no UE assist, cues for LLE positioning to avoid pain with new AFO adjustment.   Pt instructed why need for practice of various surfaces and adapting to surfaces is important in her recovery and her overall mobility.    PATIENT EDUCATION: Education details:Fear of falling and how this is a natural response and feeling toward higher level interventions.  Person educated: Patient Education method: Explanation Education comprehension: verbalized understanding  HOME EXERCISE PROGRAM:   Access Code: [email or web address] URL: [email or web address] Date: 07/31/2022 Prepared by: Grier Rocher  Exercises - Standing Tandem Balance with Counter Support  - 1 x daily - 3 x weekly - 2 sets - 2 reps - 10 hold - Standing Single Leg Stance with Counter Support  - 1 x daily - 3 x weekly - 2 sets - 2 reps - 5 hold  Updated 05/29/2022: Access Code: [email or web address] URL: [email or web address] Date: 05/29/2022 Prepared by: Temple Pacini  Exercises - Seated March  - 1 x daily - 5 x weekly - 3 sets - 10 reps - Seated Long Arc Quad  - 1 x daily - 7 x weekly - 3 sets - 10 reps -written on handout: seated core twists with holding YTB or RTB 5x weekly 3x10 each side  Previous: Pt performs leg lifts and squats with UE support    GOALS: Goals reviewed with patient? Yes  SHORT TERM GOALS: Target date: 06/24/2022    Patient will be independent in home exercise program to improve strength/mobility for better functional independence with ADLs. Baseline: No HEP currently  Goal status: INITIAL  LONG TERM GOALS: Target date: 08/19/2022  1.  Patient (> 44 years old) will complete five times sit to stand test in < 15 seconds indicating an increased LE strength and improved balance. Baseline:  Test second visit ; 05/29/2022: 40 sec  pull to stance RUE 07/10/22: 22.82 sec. 08/18/22: 16.36 sec with UE support 09/29/22:19.32 sec, 42 sec without UE assist  Goal status: ONGOING  2.  Patient will increase FOTO score by 8 or more points  to demonstrate statistically significant improvement in mobility and quality of life.  Baseline: Test second visit; 05/28/2040: 45; 07/10/22: 41, 08/18/22: 45 8/5:48 Goal status: ONGOING  3.  Patient will increase 10 meter walk test to >.43m/s as to improve gait speed for better community ambulation and to reduce fall risk. Baseline: Test visit 2 06/19/22: 53 sec 5/16: 77 sec. 08/18/22: 26.43 sec done with 3-wheel walker. 8/5:46 sec with SPC, 33 sec with RW Goal status: ONGOING   4. Patient will improve ability to ambulate on uneven surfaces as evidenced by ability to ambulate over simulated uneven terrain in clinic to improve independence with ambulation. Baseline: Patient not ambulating on uneven terrain. 08/18/22: pt ambulated across blue tri-fold PT mat using forearm cane, cane on ground next to mat. Two attempts done, pt hesitant and nervous for 1st attempt, ability to navigate over the mat improved upon 2nd attempt. 8/5: not assessed secondary to getting new brace and  not being comfortable with it at this time.  Goal status: ONGOING   ASSESSMENT:  CLINICAL IMPRESSION:  Pt presents with good motivation from completion of PT interventions. Pt works on increased volume of ambulation with various challenges including retro gait, ambulation over objects and ambulation on red compliant surface mat. Pt able to progress to ambulating over red compliant surface mat outside of // bars for first time this session. Pt also showing progress with STS without UE assist. Pt will continue to benefit from skilled physical therapy intervention to address impairments, improve QOL, and attain therapy goals.       OBJECTIVE IMPAIRMENTS: Abnormal gait, decreased activity tolerance, decreased balance, decreased endurance,  decreased strength, decreased safety awareness, hypomobility, impaired flexibility, and impaired UE functional use.   ACTIVITY LIMITATIONS: lifting, bending, standing, stairs, transfers, bed mobility, and locomotion level  PARTICIPATION LIMITATIONS: meal prep, cleaning, shopping, and community activity  PERSONAL FACTORS: Time since onset of injury/illness/exacerbation and 3+ comorbidities: diabetes, HTN, Multiple previous strokes   are also affecting patient's functional outcome.   REHAB POTENTIAL: Fair secondary to time since onset and severely of visual deficits and L UE funcitonal use   CLINICAL DECISION MAKING: Stable/uncomplicated  EVALUATION COMPLEXITY: Low  PLAN:  PT FREQUENCY: 2x/week  PT DURATION: 12 weeks  PLANNED INTERVENTIONS: Therapeutic exercises, Therapeutic activity, Neuromuscular re-education, Balance training, Gait training, Patient/Family education, Self Care, Joint mobilization, Stair training, Moist heat, Manual therapy, and Re-evaluation  PLAN FOR NEXT SESSION:   Gait training on gravel and in parking lot to simulate home and community.  Eyes closed balance to harness vestibular and proprioceptive senses. Progression to ambulation on red mat.    Norman Herrlich PT ,DPT Physical Therapist- [place]  [facility]   3:01 PM 10/13/22

## 2022-10-13 NOTE — Telephone Encounter (Signed)
If you are happy with how your bowel movements are without the linzess then you dont need to take it

## 2022-10-15 ENCOUNTER — Ambulatory Visit: Payer: PPO | Admitting: Physical Therapy

## 2022-10-15 DIAGNOSIS — R2681 Unsteadiness on feet: Secondary | ICD-10-CM

## 2022-10-15 DIAGNOSIS — R262 Difficulty in walking, not elsewhere classified: Secondary | ICD-10-CM

## 2022-10-15 DIAGNOSIS — M6281 Muscle weakness (generalized): Secondary | ICD-10-CM

## 2022-10-15 DIAGNOSIS — R269 Unspecified abnormalities of gait and mobility: Secondary | ICD-10-CM

## 2022-10-15 DIAGNOSIS — R2689 Other abnormalities of gait and mobility: Secondary | ICD-10-CM

## 2022-10-15 DIAGNOSIS — E1142 Type 2 diabetes mellitus with diabetic polyneuropathy: Secondary | ICD-10-CM | POA: Diagnosis not present

## 2022-10-15 NOTE — Therapy (Signed)
OUTPATIENT PHYSICAL THERAPY NEURO TREATMENT     Patient Name: Danniel Vandevoorde MRN: [id number] DOB:1950-05-15, 72 y.o., female Today's Date: 10/15/2022   PCP:  Enid Baas, MD   REFERRING PROVIDER: Morene Crocker, MD   END OF SESSION:  PT End of Session - 10/15/22 1408     Visit Number 35    Number of Visits 44    Date for PT Re-Evaluation 11/10/22    Authorization Type [employer]    Progress Note Due on Visit 40    PT Start Time 1403    PT Stop Time 1444    PT Time Calculation (min) 41 min    Equipment Utilized During Treatment Gait belt    Activity Tolerance Patient tolerated treatment well;No increased pain    Behavior During Therapy [facility] for tasks assessed/performed                            Past Medical History:  Diagnosis Date   Abdominal pain 12/06/2013   Abnormality of gait 01/26/2013   Altered bowel habits 12/11/2017   Bruises easily    Constipation    Crohn's disease (HCC)    Depression    Dizziness and giddiness 01/26/2013   Fecal incontinence alternating with constipation 12/11/2017   GERD (gastroesophageal reflux disease)    Hemianopia    left   Hemiparesis and alteration of sensations as late effects of stroke (HCC) 01/26/2013   Hyperlipidemia    Hypertension    Hypothyroidism    Lump in female breast    Panic disorder    PONV (postoperative nausea and vomiting)    Stroke (HCC) 2009   Tremor, essential 04/24/2020   Type II diabetes mellitus (HCC)    Past Surgical History:  Procedure Laterality Date   ANAL FISSURE REPAIR  2008   APPENDECTOMY  1986   BREAST BIOPSY Left 2006   neg   BREAST LUMPECTOMY Right 1990's   CESAREAN SECTION  1981; 1983   CHOLECYSTECTOMY  1986   SMALL INTESTINE SURGERY  05/1991   TUBAL LIGATION  1983   Patient Active Problem List   Diagnosis Date Noted   Bone spur 06/13/2021   Acute CVA (cerebrovascular accident) (HCC) 03/07/2021   CVA (cerebral vascular accident) (HCC)  11/01/2020   Chronic pain 04/25/2020   Chronic constipation 04/25/2020   Gastro-esophageal reflux disease without esophagitis 04/25/2020   Hardening of the aorta (main artery of the heart) (HCC) 04/25/2020   History of iron deficiency anemia 04/25/2020   Hypercholesteremia 04/25/2020   Hypertension 04/25/2020   Personal history of transient ischemic attack (TIA), and cerebral infarction without residual deficits 04/25/2020   Pulmonary emphysema (HCC) 04/25/2020   Vitamin D deficiency 04/25/2020   Tremor, essential 04/24/2020   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  Rationale for Evaluation and Treatment: Rehabilitation  SUBJECTIVE:  SUBJECTIVE STATEMENT:   Pt reports doing well today. She was able to put her AFO on independently this AM which she was proud of. She also reports improved comfort with AFO.    Pt accompanied by: self  PERTINENT HISTORY: Pt has had one major stroke and 5 mini strokes since 2009. Pt is here since her doctor wants her to work on her strength and balance and she also wants to improve in these categories. Pt has hemianopsia with blindness inferior, left side and superior. Pt initially had stroke in 2009 and was previously able to walk for long distances. Pt has  650 feet driveway that is gravel and used to walk up and down with the cane. Pt has issues with depth perception. Pt is unable to tell angles of concrete and holes in the ground. Pt has service dogs that are retired, one for guidance and one for helping with picking up and retrieving items. Pt had most recent stroke in January 2023 that further limited her field of vision.  Patient wants to work diligently on her strength, balance, mobility, and in order to improve her ability to ambulate on various surfaces in various scenarios.  Patient wanted to come to outpatient services to utilize equipment to improve her strength and a greater capacity than home health was able to assist her with.  She is doing home exercise program involving mostly standing with upper extremity support and activities to improve her strength.  Patient reports she is very nervous to ambulate without contact-guard assist and gait belt without being able to hold onto a wall or steady surface in order to provide her with tactile cues for the areas around her.  PAIN:  Are you having pain? None  PRECAUTIONS: Fall, hemianopsia  WEIGHT BEARING RESTRICTIONS: No  FALLS: Has patient fallen in last 6 months? No  PATIENT GOALS: Improve gait, improve strength, walk on unsteady surfaces  OBJECTIVE:   TODAY'S TREATMENT:                                                                                                                              DATE: 10/15/22 Unless otherwise stated, CGA was provided and gait belt donned in order to ensure pt safety  TA  STS x 10 with no UE assist, cues for LLE positioning to avoid pain with new AFO adjustment.   Forward and retro gait x 15 ft ea, step to pattern with retro gait, minimal LLE movement in retro gait.   NMR EC balance in wide tandem with LLE post 3 x 30 sec   SLS progression for LLE proprioception 3 x 30 sec with LLE on airex and R LE on 6 inch step anteriorly   Pt instructed why  need for practice of various surfaces and adapting to surfaces is important in her recovery and her overall mobility.    PATIENT EDUCATION: Education details:Fear of falling and how this is a natural response and feeling  toward higher level interventions.  Person educated: Patient Education method: Explanation Education comprehension: verbalized understanding  HOME EXERCISE PROGRAM:   Access Code: [email or web address] URL: [email or web address] Date: 07/31/2022 Prepared by: Grier Rocher  Exercises - Standing Tandem Balance with Counter Support  - 1 x daily - 3 x weekly - 2 sets - 2 reps - 10 hold - Standing Single Leg Stance with Counter Support  - 1 x daily - 3 x weekly - 2 sets - 2 reps - 5 hold  Updated 05/29/2022: Access Code: [email or web address] URL: [email or web address] Date: 05/29/2022 Prepared by: Temple Pacini  Exercises - Seated March  - 1 x daily - 5 x weekly - 3 sets - 10 reps - Seated Long Arc Quad  - 1 x daily - 7 x weekly - 3 sets - 10 reps -written on handout: seated core twists with holding YTB or RTB 5x weekly 3x10 each side  Previous: Pt performs leg lifts and squats with UE support    GOALS: Goals reviewed with patient? Yes  SHORT TERM GOALS: Target date: 06/24/2022    Patient will be independent in home exercise program to improve strength/mobility for better functional independence with ADLs. Baseline: No HEP currently  Goal status: INITIAL  LONG TERM GOALS: Target date: 08/19/2022  1.  Patient (> 33 years old) will complete five times sit to stand test in < 15 seconds indicating an increased LE strength and improved balance. Baseline:  Test second visit ; 05/29/2022: 40 sec pull to stance RUE 07/10/22: 22.82 sec. 08/18/22: 16.36 sec with UE support 09/29/22:19.32 sec, 42 sec without UE assist  Goal status: ONGOING  2.  Patient will increase FOTO score by 8 or more points  to demonstrate statistically significant improvement in mobility and quality  of life.  Baseline: Test second visit; 05/28/2040: 45; 07/10/22: 41, 08/18/22: 45 8/5:48 Goal status: ONGOING  3.  Patient will increase 10 meter walk test to >.49m/s as to improve gait speed for better community ambulation and to reduce fall risk. Baseline: Test visit 2 06/19/22: 53 sec 5/16: 77 sec. 08/18/22: 26.43 sec done with 3-wheel walker. 8/5:46 sec with SPC, 33 sec with RW Goal status: ONGOING   4. Patient will improve ability to ambulate on uneven surfaces as evidenced by ability to ambulate over simulated uneven terrain in clinic to improve independence with ambulation. Baseline: Patient not ambulating on uneven terrain. 08/18/22: pt ambulated across blue tri-fold PT mat using forearm cane, cane on ground next to mat. Two attempts done, pt hesitant and nervous for 1st attempt, ability to navigate over the mat improved upon 2nd attempt. 8/5: not assessed secondary to getting new brace and not being comfortable with it at this time.  Goal status: ONGOING   ASSESSMENT:  CLINICAL IMPRESSION:  Pt presents with good motivation from completion of PT interventions. Pt progressed with balance activities with focus on LLE balance and proprioceptive challenges. Pt had high difficulty with airex pad balance. Pt will continue to benefit from skilled physical therapy intervention to address impairments, improve QOL, and attain therapy goals.       OBJECTIVE IMPAIRMENTS: Abnormal gait, decreased activity tolerance, decreased balance, decreased endurance, decreased strength, decreased safety awareness, hypomobility, impaired flexibility, and impaired UE functional use.   ACTIVITY LIMITATIONS: lifting, bending, standing, stairs, transfers, bed mobility, and locomotion level  PARTICIPATION LIMITATIONS: meal prep, cleaning, shopping, and community activity  PERSONAL FACTORS: Time since onset of injury/illness/exacerbation and 3+ comorbidities: diabetes, HTN, Multiple previous strokes   are also  affecting  patient's functional outcome.   REHAB POTENTIAL: Fair secondary to time since onset and severely of visual deficits and L UE funcitonal use   CLINICAL DECISION MAKING: Stable/uncomplicated  EVALUATION COMPLEXITY: Low  PLAN:  PT FREQUENCY: 2x/week  PT DURATION: 12 weeks  PLANNED INTERVENTIONS: Therapeutic exercises, Therapeutic activity, Neuromuscular re-education, Balance training, Gait training, Patient/Family education, Self Care, Joint mobilization, Stair training, Moist heat, Manual therapy, and Re-evaluation  PLAN FOR NEXT SESSION:   Gait training on gravel and in parking lot to simulate home and community.  Eyes closed balance to harness vestibular and proprioceptive senses. Progression to ambulation on red mat.    Norman Herrlich PT ,DPT Physical Therapist- [facility]   2:24 PM 10/15/22

## 2022-10-20 ENCOUNTER — Ambulatory Visit: Payer: PPO

## 2022-10-20 ENCOUNTER — Other Ambulatory Visit: Payer: Self-pay

## 2022-10-20 ENCOUNTER — Ambulatory Visit: Payer: PPO | Admitting: Physical Therapy

## 2022-10-20 DIAGNOSIS — R262 Difficulty in walking, not elsewhere classified: Secondary | ICD-10-CM

## 2022-10-20 DIAGNOSIS — R2681 Unsteadiness on feet: Secondary | ICD-10-CM

## 2022-10-20 DIAGNOSIS — R278 Other lack of coordination: Secondary | ICD-10-CM

## 2022-10-20 DIAGNOSIS — M6281 Muscle weakness (generalized): Secondary | ICD-10-CM

## 2022-10-20 DIAGNOSIS — R269 Unspecified abnormalities of gait and mobility: Secondary | ICD-10-CM

## 2022-10-20 DIAGNOSIS — R2689 Other abnormalities of gait and mobility: Secondary | ICD-10-CM

## 2022-10-20 MED ORDER — FLUTICASONE PROPIONATE 50 MCG/ACT NA SUSP
2.0000 | Freq: Every day | NASAL | 5 refills | Status: DC
  Filled 2022-10-20: qty 16, 30d supply, fill #0
  Filled 2022-11-21 – 2023-09-14 (×2): qty 16, 30d supply, fill #1

## 2022-10-20 NOTE — Therapy (Unsigned)
OUTPATIENT OCCUPATIONAL THERAPY NEURO EVALUATION  Patient Name: [name] MRN: [id number] DOB:[birth year], 72 y.o., female Today's Date: 10/21/2022  PCP: Dr. Enid Baas REFERRING PROVIDER: Dr. Theora Master  END OF SESSION:  OT End of Session - 10/21/22 2110     Visit Number 1    Number of Visits 24    Date for OT Re-Evaluation 01/12/23    Progress Note Due on Visit 10    OT Start Time 1315    OT Stop Time 1400    OT Time Calculation (min) 45 min    Equipment Utilized During Treatment wc    Activity Tolerance Patient tolerated treatment well    Behavior During Therapy [facility] for tasks assessed/performed             Past Medical History:  Diagnosis Date   Abdominal pain 12/06/2013   Abnormality of gait 01/26/2013   Altered bowel habits 12/11/2017   Bruises easily    Constipation    Crohn's disease (HCC)    Depression    Dizziness and giddiness 01/26/2013   Fecal incontinence alternating with constipation 12/11/2017   GERD (gastroesophageal reflux disease)    Hemianopia    left   Hemiparesis and alteration of sensations as late effects of stroke (HCC) 01/26/2013   Hyperlipidemia    Hypertension    Hypothyroidism    Lump in female breast    Panic disorder    PONV (postoperative nausea and vomiting)    Stroke (HCC) 2009   Tremor, essential 04/24/2020   Type II diabetes mellitus (HCC)    Past Surgical History:  Procedure Laterality Date   ANAL FISSURE REPAIR  2008   APPENDECTOMY  1986   BREAST BIOPSY Left 2006   neg   BREAST LUMPECTOMY Right 1990's   CESAREAN SECTION  1981; 1983   CHOLECYSTECTOMY  1986   SMALL INTESTINE SURGERY  05/1991   TUBAL LIGATION  1983   Patient Active Problem List   Diagnosis Date Noted   Bone spur 06/13/2021   Acute CVA (cerebrovascular accident) (HCC) 03/07/2021   CVA (cerebral vascular accident) (HCC) 11/01/2020   Chronic pain 04/25/2020   Chronic constipation 04/25/2020   Gastro-esophageal reflux disease without  esophagitis 04/25/2020   Hardening of the aorta (main artery of the heart) (HCC) 04/25/2020   History of iron deficiency anemia 04/25/2020   Hypercholesteremia 04/25/2020   Hypertension 04/25/2020   Personal history of transient ischemic attack (TIA), and cerebral infarction without residual deficits 04/25/2020   Pulmonary emphysema (HCC) 04/25/2020   Vitamin D deficiency 04/25/2020   Tremor, essential 04/24/2020   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   Right lower quadrant abdominal pain 08/18/2018   Spastic hemiplegia affecting nondominant side (HCC) 02/03/2014   Diabetes mellitus (HCC) 09/07/2013   Hypothyroidism 09/07/2013   Type 2 diabetes mellitus without complications (HCC) 09/07/2013   Hemiparesis and alteration of sensations as late effects of stroke (HCC) 01/26/2013   Unsteady gait 01/26/2013   Dizziness and giddiness 01/26/2013    ONSET DATE: 04/2007  REFERRING DIAG: I63.9 (ICD-10-CM) - CVA (cerebral vascular accident) (HCC)   THERAPY DIAG:  Muscle weakness (generalized)  Other lack of coordination  Rationale for Evaluation and Treatment: Rehabilitation  SUBJECTIVE:  SUBJECTIVE STATEMENT: Pt reports she thinks she started getting weaker in 2020 and states that her husband likes to do everything for her, often when she knows she can do certain things herself like make lunch. Pt accompanied by: self  PERTINENT HISTORY: Hx of CVA in 2009 resulting  in L sided hemiplegia and hemianopsia.  Pt reports hx of tremors in the R hand and has attempted use of heavy utensils and weight on the wrist which did not feel was helpful.  No current medication for the tremor.  Pt reports she received a recent cortisone shot in the R hand for pain at the base of the LF and RF.  Pt had a more recent CVA in 2023 which further limited vision and weakness.  PRECAUTIONS: Fall  WEIGHT BEARING RESTRICTIONS: No  PAIN: Pt reports pain varies.  Are you having pain? Yes: NPRS scale: 3/10  with gabapentin, without it 8/10 Pain location: LUE/LLE Pain description: spasms in the arm and leg  Aggravating factors: nothing Relieving factors: Gabapentin helps a little  FALLS: Has patient fallen in last 6 months? No  LIVING ENVIRONMENT: Lives with: lives with their spouse Lives in: 1 level home Stairs:  ramp Has following equipment at home: Single point cane, Walker - 4 wheeled, Shower bench, Grab bars, Ramped entry, and 3 wheeled walker, handicapped height toilet and grab bars by the toilet   PLOF: Independent prior to original CVA in 2009.   PATIENT GOALS: Increase LUE strength (pt feels she lost some strength she had gained after rehab from original stroke, as she had been able to use the LUE as a better stabilizer; see goals).  OBJECTIVE:  HAND DOMINANCE: Right  ADLs: Overall ADLs: Spouse assists  Transfers/ambulation related to ADLs: modified indep from wc  Eating: able to cut food with rocker knife Grooming: assist to apply deodorant under L arm d/t limited active L shoulder abd UB Dressing: occasional assist to don a shirt but pt can clasp a bra in the front and don overhead like a sports bra.  Pt reports she may start trying a sports bra as her L bra strap tends to fall.   LB Dressing: assist to manage clothing fasteners on a pair of pants  Toileting: modified indep Bathing: distant supv within her handicapped accessible shower Tub Shower transfers: modified indep Equipment:  see above  IADLs: Shopping: Spouse manages Light housekeeping: pt helps to load/unload dishwasher  Meal Prep: supv-modified indep with light cold meal prep Community mobility: wc; unable to drive Medication management: indep Landscape architect: spouse manages Handwriting:  NT; pt is R hand dominant  MOBILITY STATUS:  uses manual wc; limited ambulation with AD  POSTURE COMMENTS:  L sided hemiplegia  ACTIVITY TOLERANCE: Activity tolerance: to be further assessed  FUNCTIONAL  OUTCOME MEASURES: FOTO: 29; predicted 36  UPPER EXTREMITY ROM:  Time constraints limited additional measurements; will complete next session  Active ROM Right Eval WNL Left eval  Shoulder flexion   20  Shoulder abduction    Shoulder adduction    Shoulder extension    Shoulder internal rotation    Shoulder external rotation    Elbow flexion    Elbow extension  -55  Wrist flexion    Wrist extension    Wrist ulnar deviation    Wrist radial deviation    Wrist pronation    Wrist supination    (Blank rows = not tested)  UPPER EXTREMITY MMT: To be tested next session  MMT Right eval Left eval  Shoulder flexion    Shoulder abduction    Shoulder adduction    Shoulder extension    Shoulder internal rotation    Shoulder external rotation    Middle trapezius    Lower trapezius    Elbow flexion    Elbow  extension    Wrist flexion    Wrist extension    Wrist ulnar deviation    Wrist radial deviation    Wrist pronation    Wrist supination    (Blank rows = not tested)  HAND FUNCTION: Grip strength: Right: 18 lbs; Left: 4 lbs, Lateral pinch: Right: 13 lbs, Left: 6 lbs, and 3 point pinch: Right: 11 lbs, Left: unable lbs  COORDINATION:  9 Hole Peg test: Right: NT sec; Left: NT/unable sec  SENSATION: Light touch: WFL  EDEMA: No visible edema  MUSCLE TONE: LUE: Severe spasticity  COGNITION: Overall cognitive status: Within functional limits for tasks assessed  VISION: Baseline vision: Wears glasses all the time  VISION ASSESSMENT: L hemianopsia following CVA in 2009  PERCEPTION: Not tested  PRAXIS: Impaired: Motor planning  OBSERVATIONS:  Pt pleasant and cooperative.  Verbalizes desire to regain some strength in the LUE, but appropriate expectation for goal to use L hand/arm as a better stabilizer for ADL supplies.  TODAY'S TREATMENT:                                                                                                                               DATE: 10/20/22 Evaluation completed.   PATIENT EDUCATION: Education details: OT role, goals, poc Person educated: Patient Education method: Explanation Education comprehension: verbalized understanding  HOME EXERCISE PROGRAM: TBD   GOALS: Goals reviewed with patient? Yes  SHORT TERM GOALS: Target date: 12/01/22 (6 weeks)  Pt will be indep to perform HEP for improving LUE strength for stabilizing ADL supplies. Baseline: Eval: HEP not yet initiated Goal status: INITIAL  LONG TERM GOALS: Target date: 01/12/23 (12 weeks)  Pt will increase FOTO score to 36 or better to indicate improvement in self perceived functional use of the L arm with daily tasks. Baseline: Eval: 29 Goal status: INITIAL  2.  Pt will increase L grip strength by 5 or more to be able to stabilize a food can in the L hand while the R hand manipulates a can opener. Baseline: Eval: L grip 4 lbs (pt reports previously able to manage this task, but unable over the last several years) Goal status: INITIAL  3.  Pt will be indep with LUE positioning strategies to promote good skin integrity beneath L axilla/arm/L side of trunk Baseline: Eval: Pt reports excessive sweating beneath L axilla and difficulty maintaining LUE into slight abd; initiated educ on wc arm trough or pillow placement while seated in wc Goal status: INITIAL  4.  Pt will increase LUE strength grossly 1/2 muscle grade in order to stabilize ADL supplies against trunk and improve ability to reposition LUE during self care tasks. Baseline: Eval: Will complete MMT next session Goal status: INITIAL  5.  Pt will don/doff a sports bra with modified indep. Baseline: Eval: pt reports L bra strap routinely falls and she may try a sports bra Goal status: INITIAL  ASSESSMENT:  CLINICAL IMPRESSION: Patient is a  72 y.o. female who was seen today for occupational therapy evaluation for functional decline related to hx of CVAs.  Initial CVA was 2009, resulting in  L sided hemiplegia and hemianopsia.  Pt sustained a smaller CVA in 2023.  Pt's goal is to regain strength in her LUE to enable better use of the LUE as a stabilizer during self care tasks.  Pt reports that she had once been able to hold a can in her L hand while using a can opener in her R, but no longer has this strength.  Pt reports that she is eager to be as indep as she can, but expresses frustration with her spouse helping her more than necessary, which she feels may also be contributing to her loss of strength.  Pt will benefit from skilled OT to provide therapeutic exercises, activities, and ADL training in order to maximize indep with daily tasks.     PERFORMANCE DEFICITS: in functional skills including ADLs, IADLs, coordination, dexterity, tone, ROM, strength, flexibility, Fine motor control, Gross motor control, hearing, mobility, balance, body mechanics, endurance, decreased knowledge of use of DME, skin integrity, vision, and UE functional use, and psychosocial skills including coping strategies, environmental adaptation, habits, interpersonal interactions, and routines and behaviors.   IMPAIRMENTS: are limiting patient from ADLs and IADLs.   CO-MORBIDITIES: has co-morbidities such as hemianopsia, emphysema, tremor, spastic hemiplegia  that affects occupational performance. Patient will benefit from skilled OT to address above impairments and improve overall function.  MODIFICATION OR ASSISTANCE TO COMPLETE EVALUATION: Min-Moderate modification of tasks or assist with assess necessary to complete an evaluation.  OT OCCUPATIONAL PROFILE AND HISTORY: Problem focused assessment: Including review of records relating to presenting problem.  CLINICAL DECISION MAKING: Moderate - several treatment options, min-mod task modification necessary  REHAB POTENTIAL: Good for goals  EVALUATION COMPLEXITY: Moderate    PLAN:  OT FREQUENCY: 2x/week  OT DURATION: 12 weeks  PLANNED INTERVENTIONS:  self care/ADL training, therapeutic exercise, therapeutic activity, neuromuscular re-education, manual therapy, passive range of motion, splinting, moist heat, cryotherapy, patient/family education, visual/perceptual remediation/compensation, psychosocial skills training, energy conservation, coping strategies training, and DME and/or AE instructions  RECOMMENDED OTHER SERVICES: None at this time  CONSULTED AND AGREED WITH PLAN OF CARE: Patient  PLAN FOR NEXT SESSION: complete ROM and MMT measures (limited by time constraints at eval)  Danelle Earthly, MS, OTR/L   Otis Dials, OT 10/21/2022, 9:16 PM

## 2022-10-20 NOTE — Therapy (Signed)
OUTPATIENT PHYSICAL THERAPY NEURO TREATMENT     Patient Name: Kajuana Marsan MRN: [id number] DOB:1950-08-04, 72 y.o., female Today's Date: 10/20/2022   PCP:  Enid Baas, MD   REFERRING PROVIDER: Morene Crocker, MD   END OF SESSION:  PT End of Session - 10/20/22 1342     Visit Number 36    Number of Visits 44    Date for PT Re-Evaluation 11/10/22    Authorization Type [employer]    Progress Note Due on Visit 40    PT Start Time 1400    PT Stop Time 1444    PT Time Calculation (min) 44 min    Equipment Utilized During Treatment Gait belt    Activity Tolerance Patient tolerated treatment well;No increased pain    Behavior During Therapy [facility] for tasks assessed/performed                             Past Medical History:  Diagnosis Date   Abdominal pain 12/06/2013   Abnormality of gait 01/26/2013   Altered bowel habits 12/11/2017   Bruises easily    Constipation    Crohn's disease (HCC)    Depression    Dizziness and giddiness 01/26/2013   Fecal incontinence alternating with constipation 12/11/2017   GERD (gastroesophageal reflux disease)    Hemianopia    left   Hemiparesis and alteration of sensations as late effects of stroke (HCC) 01/26/2013   Hyperlipidemia    Hypertension    Hypothyroidism    Lump in female breast    Panic disorder    PONV (postoperative nausea and vomiting)    Stroke (HCC) 2009   Tremor, essential 04/24/2020   Type II diabetes mellitus (HCC)    Past Surgical History:  Procedure Laterality Date   ANAL FISSURE REPAIR  2008   APPENDECTOMY  1986   BREAST BIOPSY Left 2006   neg   BREAST LUMPECTOMY Right 1990's   CESAREAN SECTION  1981; 1983   CHOLECYSTECTOMY  1986   SMALL INTESTINE SURGERY  05/1991   TUBAL LIGATION  1983   Patient Active Problem List   Diagnosis Date Noted   Bone spur 06/13/2021   Acute CVA (cerebrovascular accident) (HCC) 03/07/2021   CVA (cerebral vascular accident) (HCC)  11/01/2020   Chronic pain 04/25/2020   Chronic constipation 04/25/2020   Gastro-esophageal reflux disease without esophagitis 04/25/2020   Hardening of the aorta (main artery of the heart) (HCC) 04/25/2020   History of iron deficiency anemia 04/25/2020   Hypercholesteremia 04/25/2020   Hypertension 04/25/2020   Personal history of transient ischemic attack (TIA), and cerebral infarction without residual deficits 04/25/2020   Pulmonary emphysema (HCC) 04/25/2020   Vitamin D deficiency 04/25/2020   Tremor, essential 04/24/2020   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   Right lower quadrant abdominal pain 08/18/2018   Spastic hemiplegia affecting nondominant side (HCC) 02/03/2014   Diabetes mellitus (HCC) 09/07/2013   Hypothyroidism 09/07/2013   Type 2 diabetes mellitus without complications (HCC) 09/07/2013   Hemiparesis and alteration of sensations as late effects of stroke (HCC) 01/26/2013   Unsteady gait 01/26/2013   Dizziness and giddiness 01/26/2013    ONSET DATE: 2009  REFERRING DIAG: Z86.73 (ICD-10-CM) - History of stroke   THERAPY DIAG:  Difficulty in walking, not elsewhere classified  Other abnormalities of gait and mobility  Unsteadiness on feet  Abnormality of gait and mobility  Muscle weakness (generalized)  Rationale for Evaluation and Treatment:  Rehabilitation  SUBJECTIVE:                                                                                                                                                                                             SUBJECTIVE STATEMENT:   Pt reports doing well today. She continues to have some discomfort putting on AFO but the overall comfort of AFO.  Patient reports OT appointment prior to this went well.   Pt accompanied by: self  PERTINENT HISTORY: Pt has had one major stroke and 5 mini strokes since 2009. Pt is here since her doctor wants her to work on her strength and balance and she also wants to  improve in these categories. Pt has hemianopsia with blindness inferior, left side and superior. Pt initially had stroke in 2009 and was previously able to walk for long distances. Pt has 650 feet driveway that is gravel and used to walk up and down with the cane. Pt has issues with depth perception. Pt is unable to tell angles of concrete and holes in the ground. Pt has service dogs that are retired, one for guidance and one for helping with picking up and retrieving items. Pt had most recent stroke in January 2023 that further limited her field of vision.  Patient wants to work diligently on her strength, balance, mobility, and in order to improve her ability to ambulate on various surfaces in various scenarios.  Patient wanted to come to outpatient services to utilize equipment to improve her strength and a greater capacity than home health was able to assist her with.  She is doing home exercise program involving mostly standing with upper extremity support and activities to improve her strength.  Patient reports she is very nervous to ambulate without contact-guard assist and gait belt without being able to hold onto a wall or steady surface in order to provide her with tactile cues for the areas around her.  PAIN:  Are you having pain? None  PRECAUTIONS: Fall, hemianopsia  WEIGHT BEARING RESTRICTIONS: No  FALLS: Has patient fallen in last 6 months? No  PATIENT GOALS: Improve gait, improve strength, walk on unsteady surfaces  OBJECTIVE:   TODAY'S TREATMENT:  DATE: 10/20/22 Unless otherwise stated, CGA was provided and gait belt donned in order to ensure pt safety  TA  Gait outdoors x 40 ft with SPC and CGA   STS x 10 with no UE assist, cues for LLE positioning to avoid pain with new AFO adjustment.   Standing lateral step up 2 x 10 reps   - Pt challenged with STS  without UE each time she transitions from STS this date despite fatigue pt able to complete.   NMR  EC balance in wide tandem with LLE post 3 x 30 sec     PATIENT EDUCATION: Education details:Fear of falling and how this is a natural response and feeling toward higher level interventions.  Person educated: Patient Education method: Explanation Education comprehension: verbalized understanding  HOME EXERCISE PROGRAM:   Access Code: [email or web address] URL: [email or web address] Date: 07/31/2022 Prepared by: Grier Rocher  Exercises - Standing Tandem Balance with Counter Support  - 1 x daily - 3 x weekly - 2 sets - 2 reps - 10 hold - Standing Single Leg Stance with Counter Support  - 1 x daily - 3 x weekly - 2 sets - 2 reps - 5 hold  Updated 05/29/2022: Access Code: [email or web address] URL: [email or web address] Date: 05/29/2022 Prepared by: Temple Pacini  Exercises - Seated March  - 1 x daily - 5 x weekly - 3 sets - 10 reps - Seated Long Arc Quad  - 1 x daily - 7 x weekly - 3 sets - 10 reps -written on handout: seated core twists with holding YTB or RTB 5x weekly 3x10 each side  Previous: Pt performs leg lifts and squats with UE support    GOALS: Goals reviewed with patient? Yes  SHORT TERM GOALS: Target date: 06/24/2022    Patient will be independent in home exercise program to improve strength/mobility for better functional independence with ADLs. Baseline: No HEP currently  Goal status: INITIAL  LONG TERM GOALS: Target date: 08/19/2022  1.  Patient (> 9 years old) will complete five times sit to stand test in < 15 seconds indicating an increased LE strength and improved balance. Baseline:  Test second visit ; 05/29/2022: 40 sec pull to stance RUE 07/10/22: 22.82 sec. 08/18/22: 16.36 sec with UE support 09/29/22:19.32 sec, 42 sec without UE assist  Goal status: ONGOING  2.  Patient will increase FOTO score by 8 or more points  to demonstrate statistically  significant improvement in mobility and quality of life.  Baseline: Test second visit; 05/28/2040: 45; 07/10/22: 41, 08/18/22: 45 8/5:48 Goal status: ONGOING  3.  Patient will increase 10 meter walk test to >.14m/s as to improve gait speed for better community ambulation and to reduce fall risk. Baseline: Test visit 2 06/19/22: 53 sec 5/16: 77 sec. 08/18/22: 26.43 sec done with 3-wheel walker. 8/5:46 sec with SPC, 33 sec with RW Goal status: ONGOING   4. Patient will improve ability to ambulate on uneven surfaces as evidenced by ability to ambulate over simulated uneven terrain in clinic to improve independence with ambulation. Baseline: Patient not ambulating on uneven terrain. 08/18/22: pt ambulated across blue tri-fold PT mat using forearm cane, cane on ground next to mat. Two attempts done, pt hesitant and nervous for 1st attempt, ability to navigate over the mat improved upon 2nd attempt. 8/5: not assessed secondary to getting new brace and not being comfortable with it at this time.  Goal status: ONGOING   ASSESSMENT:  CLINICAL IMPRESSION:  Pt presents with good motivation from completion  of PT interventions. Pt progressed with frontal plane strengthening with side step up on stair activity.  Patient ambulated in the interim outside but returned inside secondary to heat conditions and outside environment.  Did utilize some physical therapy treatment time transporting patient to and from outdoor area.  Pt will continue to benefit from skilled physical therapy intervention to address impairments, improve QOL, and attain therapy goals.       OBJECTIVE IMPAIRMENTS: Abnormal gait, decreased activity tolerance, decreased balance, decreased endurance, decreased strength, decreased safety awareness, hypomobility, impaired flexibility, and impaired UE functional use.   ACTIVITY LIMITATIONS: lifting, bending, standing, stairs, transfers, bed mobility, and locomotion level  PARTICIPATION LIMITATIONS:  meal prep, cleaning, shopping, and community activity  PERSONAL FACTORS: Time since onset of injury/illness/exacerbation and 3+ comorbidities: diabetes, HTN, Multiple previous strokes   are also affecting patient's functional outcome.   REHAB POTENTIAL: Fair secondary to time since onset and severely of visual deficits and L UE funcitonal use   CLINICAL DECISION MAKING: Stable/uncomplicated  EVALUATION COMPLEXITY: Low  PLAN:  PT FREQUENCY: 2x/week  PT DURATION: 12 weeks  PLANNED INTERVENTIONS: Therapeutic exercises, Therapeutic activity, Neuromuscular re-education, Balance training, Gait training, Patient/Family education, Self Care, Joint mobilization, Stair training, Moist heat, Manual therapy, and Re-evaluation  PLAN FOR NEXT SESSION:   Gait training on gravel and in parking lot to simulate home and community.  Eyes closed balance to harness vestibular and proprioceptive senses. Progression to ambulation on red mat.    Norman Herrlich PT ,DPT Physical Therapist- [place]  [facility]   1:43 PM 10/20/22

## 2022-10-22 ENCOUNTER — Ambulatory Visit: Payer: PPO | Admitting: Occupational Therapy

## 2022-10-22 ENCOUNTER — Ambulatory Visit: Payer: PPO | Admitting: Physical Therapy

## 2022-10-22 ENCOUNTER — Encounter: Payer: Self-pay | Admitting: Physical Therapy

## 2022-10-22 DIAGNOSIS — R2681 Unsteadiness on feet: Secondary | ICD-10-CM

## 2022-10-22 DIAGNOSIS — M6281 Muscle weakness (generalized): Secondary | ICD-10-CM

## 2022-10-22 DIAGNOSIS — R262 Difficulty in walking, not elsewhere classified: Secondary | ICD-10-CM

## 2022-10-22 DIAGNOSIS — R269 Unspecified abnormalities of gait and mobility: Secondary | ICD-10-CM

## 2022-10-22 DIAGNOSIS — R2689 Other abnormalities of gait and mobility: Secondary | ICD-10-CM

## 2022-10-22 NOTE — Therapy (Signed)
OUTPATIENT OCCUPATIONAL THERAPY NEURO TREATMENT NOTE  Patient Name: [name] MRN: [id number] DOB:Mar 16, 1950, 72 y.o., female Today's Date: 10/22/2022  PCP: Dr. Enid Baas REFERRING PROVIDER: Dr. Theora Master  END OF SESSION:  OT End of Session - 10/22/22 1529     Visit Number 2    Number of Visits 24    Date for OT Re-Evaluation 01/12/23    OT Start Time 1400    OT Stop Time 1445    OT Time Calculation (min) 45 min    Activity Tolerance Patient tolerated treatment well    Behavior During Therapy [facility] for tasks assessed/performed              Past Medical History:  Diagnosis Date   Abdominal pain 12/06/2013   Abnormality of gait 01/26/2013   Altered bowel habits 12/11/2017   Bruises easily    Constipation    Crohn's disease (HCC)    Depression    Dizziness and giddiness 01/26/2013   Fecal incontinence alternating with constipation 12/11/2017   GERD (gastroesophageal reflux disease)    Hemianopia    left   Hemiparesis and alteration of sensations as late effects of stroke (HCC) 01/26/2013   Hyperlipidemia    Hypertension    Hypothyroidism    Lump in female breast    Panic disorder    PONV (postoperative nausea and vomiting)    Stroke (HCC) 2009   Tremor, essential 04/24/2020   Type II diabetes mellitus (HCC)    Past Surgical History:  Procedure Laterality Date   ANAL FISSURE REPAIR  2008   APPENDECTOMY  1986   BREAST BIOPSY Left 2006   neg   BREAST LUMPECTOMY Right 1990's   CESAREAN SECTION  1981; 1983   CHOLECYSTECTOMY  1986   SMALL INTESTINE SURGERY  05/1991   TUBAL LIGATION  1983   Patient Active Problem List   Diagnosis Date Noted   Bone spur 06/13/2021   Acute CVA (cerebrovascular accident) (HCC) 03/07/2021   CVA (cerebral vascular accident) (HCC) 11/01/2020   Chronic pain 04/25/2020   Chronic constipation 04/25/2020   Gastro-esophageal reflux disease without esophagitis 04/25/2020   Hardening of the aorta (main artery of the heart)  (HCC) 04/25/2020   History of iron deficiency anemia 04/25/2020   Hypercholesteremia 04/25/2020   Hypertension 04/25/2020   Personal history of transient ischemic attack (TIA), and cerebral infarction without residual deficits 04/25/2020   Pulmonary emphysema (HCC) 04/25/2020   Vitamin D deficiency 04/25/2020   Tremor, essential 04/24/2020   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   Right lower quadrant abdominal pain 08/18/2018   Spastic hemiplegia affecting nondominant side (HCC) 02/03/2014   Diabetes mellitus (HCC) 09/07/2013   Hypothyroidism 09/07/2013   Type 2 diabetes mellitus without complications (HCC) 09/07/2013   Hemiparesis and alteration of sensations as late effects of stroke (HCC) 01/26/2013   Unsteady gait 01/26/2013   Dizziness and giddiness 01/26/2013    ONSET DATE: 04/2007  REFERRING DIAG: I63.9 (ICD-10-CM) - CVA (cerebral vascular accident) (HCC)   THERAPY DIAG:  Muscle weakness (generalized)  Rationale for Evaluation and Treatment: Rehabilitation  SUBJECTIVE:  SUBJECTIVE STATEMENT:  Pt. Reports doing okay today Pt accompanied by: self  PERTINENT HISTORY: Hx of CVA in 2009 resulting in L sided hemiplegia and hemianopsia.  Pt reports hx of tremors in the R hand and has attempted use of heavy utensils and weight on the wrist which did not feel was helpful.  No current medication for the tremor.  Pt reports she received a recent  cortisone shot in the R hand for pain at the base of the LF and RF.  Pt had a more recent CVA in 2023 which further limited vision and weakness.  PRECAUTIONS: Fall  WEIGHT BEARING RESTRICTIONS: No  PAIN: Pt reports pain varies.  Are you having pain? Yes: NPRS scale: 3/10 with gabapentin, without it 8/10 Pain location: LUE/LLE Pain description: spasms in the arm and leg  Aggravating factors: nothing Relieving factors: Gabapentin helps a little  FALLS: Has patient fallen in last 6 months? No  LIVING ENVIRONMENT: Lives with:  lives with their spouse Lives in: 1 level home Stairs:  ramp Has following equipment at home: Single point cane, Walker - 4 wheeled, Shower bench, Grab bars, Ramped entry, and 3 wheeled walker, handicapped height toilet and grab bars by the toilet   PLOF: Independent prior to original CVA in 2009.   PATIENT GOALS: Increase LUE strength (pt feels she lost some strength she had gained after rehab from original stroke, as she had been able to use the LUE as a better stabilizer; see goals).  OBJECTIVE:  HAND DOMINANCE: Right  ADLs: Overall ADLs: Spouse assists  Transfers/ambulation related to ADLs: modified indep from wc  Eating: able to cut food with rocker knife Grooming: assist to apply deodorant under L arm d/t limited active L shoulder abd UB Dressing: occasional assist to don a shirt but pt can clasp a bra in the front and don overhead like a sports bra.  Pt reports she may start trying a sports bra as her L bra strap tends to fall.   LB Dressing: assist to manage clothing fasteners on a pair of pants  Toileting: modified indep Bathing: distant supv within her handicapped accessible shower Tub Shower transfers: modified indep Equipment:  see above  IADLs: Shopping: Spouse manages Light housekeeping: pt helps to load/unload dishwasher  Meal Prep: supv-modified indep with light cold meal prep Community mobility: wc; unable to drive Medication management: indep Landscape architect: spouse manages Handwriting:  NT; pt is R hand dominant  MOBILITY STATUS:  uses manual wc; limited ambulation with AD  POSTURE COMMENTS:  L sided hemiplegia  ACTIVITY TOLERANCE: Activity tolerance: to be further assessed  FUNCTIONAL OUTCOME MEASURES: FOTO: 29; predicted 36  UPPER EXTREMITY ROM:  Time constraints limited additional measurements; will complete next session  Active ROM Right Eval WNL Left eval  Shoulder flexion   20  Shoulder abduction  66  Shoulder adduction    Shoulder  extension    Shoulder internal rotation    Shoulder external rotation    Elbow flexion    Elbow extension  -55  Wrist flexion    Wrist extension  -6  Wrist ulnar deviation    Wrist radial deviation    Wrist pronation    Wrist supination  70  (Blank rows = not tested)  UPPER EXTREMITY MMT: To be tested next session  MMT Right eval Left eval  Shoulder flexion  2-/5  Shoulder abduction  2/5  Shoulder adduction    Shoulder extension    Shoulder internal rotation    Shoulder external rotation    Middle trapezius    Lower trapezius    Elbow flexion    Elbow extension  2/5  Wrist flexion    Wrist extension  2-/5  Wrist ulnar deviation    Wrist radial deviation    Wrist pronation    Wrist supination  3-/5  (Blank rows = not tested)  HAND FUNCTION: Grip strength: Right: 18  lbs; Left: 4 lbs, Lateral pinch: Right: 13 lbs, Left: 6 lbs, and 3 point pinch: Right: 11 lbs, Left: unable lbs  COORDINATION:  9 Hole Peg test: Right: NT sec; Left: NT/unable sec  SENSATION: Light touch: WFL  EDEMA: No visible edema  MUSCLE TONE: LUE: Severe spasticity  COGNITION: Overall cognitive status: Within functional limits for tasks assessed  VISION: Baseline vision: Wears glasses all the time  VISION ASSESSMENT: L hemianopsia following CVA in 2009  PERCEPTION: Not tested  PRAXIS: Impaired: Motor planning  OBSERVATIONS:  Pt pleasant and cooperative.  Verbalizes desire to regain some strength in the LUE, but appropriate expectation for goal to use L hand/arm as a better stabilizer for ADL supplies.  TODAY'S TREATMENT:                                                                                                                              DATE: 10/22/22  Additional LUE measurements were obtained.   Therapeutic Ex:  Pt. tolerated PROM, slow gentle prolonged stretching in joint ranges of the LUE for shoulder flexion, abduction, elbow flexion, and extension, forearm supination,  wrist flexion, extension, and digit MP, PIP, and DIP flexion, and extension.   Manual Therapy:  Pt. tolerated soft tissue massage to the scapular, and shoulder musculature, scapular mobilizations for elevation, depression, and abduction/rotation to normalize tone, and prepare the UE for ROM. Manual therapy was performed independent of, and in preparation for therapeutic Ex.        PATIENT EDUCATION: Education details: OT role, goals, poc Person educated: Patient Education method: Explanation Education comprehension: verbalized understanding  HOME EXERCISE PROGRAM: TBD   GOALS: Goals reviewed with patient? Yes  SHORT TERM GOALS: Target date: 12/01/22 (6 weeks)  Pt will be indep to perform HEP for improving LUE strength for stabilizing ADL supplies. Baseline: Eval: HEP not yet initiated Goal status: INITIAL  LONG TERM GOALS: Target date: 01/12/23 (12 weeks)  Pt will increase FOTO score to 36 or better to indicate improvement in self perceived functional use of the L arm with daily tasks. Baseline: Eval: 29 Goal status: INITIAL  2.  Pt will increase L grip strength by 5 or more to be able to stabilize a food can in the L hand while the R hand manipulates a can opener. Baseline: Eval: L grip 4 lbs (pt reports previously able to manage this task, but unable over the last several years) Goal status: INITIAL  3.  Pt will be indep with LUE positioning strategies to promote good skin integrity beneath L axilla/arm/L side of trunk Baseline: Eval: Pt reports excessive sweating beneath L axilla and difficulty maintaining LUE into slight abd; initiated educ on wc arm trough or pillow placement while seated in wc Goal status: INITIAL  4.  Pt will increase LUE strength grossly 1/2 muscle grade in order to stabilize ADL supplies against trunk and improve ability to reposition LUE during self care tasks. Baseline: Eval: Left shoulder  flexion: 2-/5, abduction: 2/5, elbow extension: 2/5,  wrist extension: 3-/5 Goal status: INITIAL  5.  Pt will don/doff a sports bra with modified indep. Baseline: Eval: pt reports L bra strap routinely falls and she may try a sports bra Goal status: INITIAL  ASSESSMENT:  CLINICAL IMPRESSION:  Pt. presents with increased flexor tone, and tightness in the LUE, and hand which limits overall ROM, and engagements during functional ADL, and IADL tasks. Pt. responded well to treatment session overall, however continues to present spasticity. Pt continues to benefit from skilled OT to provide therapeutic exercises, neuromuscular re-education, therapeutic activities, and ADL training in order to maximize indep with daily tasks.     PERFORMANCE DEFICITS: in functional skills including ADLs, IADLs, coordination, dexterity, tone, ROM, strength, flexibility, Fine motor control, Gross motor control, hearing, mobility, balance, body mechanics, endurance, decreased knowledge of use of DME, skin integrity, vision, and UE functional use, and psychosocial skills including coping strategies, environmental adaptation, habits, interpersonal interactions, and routines and behaviors.   IMPAIRMENTS: are limiting patient from ADLs and IADLs.   CO-MORBIDITIES: has co-morbidities such as hemianopsia, emphysema, tremor, spastic hemiplegia  that affects occupational performance. Patient will benefit from skilled OT to address above impairments and improve overall function.  MODIFICATION OR ASSISTANCE TO COMPLETE EVALUATION: Min-Moderate modification of tasks or assist with assess necessary to complete an evaluation.  OT OCCUPATIONAL PROFILE AND HISTORY: Problem focused assessment: Including review of records relating to presenting problem.  CLINICAL DECISION MAKING: Moderate - several treatment options, min-mod task modification necessary  REHAB POTENTIAL: Good for goals  EVALUATION COMPLEXITY: Moderate    PLAN:  OT FREQUENCY: 2x/week  OT DURATION: 12  weeks  PLANNED INTERVENTIONS: self care/ADL training, therapeutic exercise, therapeutic activity, neuromuscular re-education, manual therapy, passive range of motion, splinting, moist heat, cryotherapy, patient/family education, visual/perceptual remediation/compensation, psychosocial skills training, energy conservation, coping strategies training, and DME and/or AE instructions  RECOMMENDED OTHER SERVICES: None at this time  CONSULTED AND AGREED WITH PLAN OF CARE: Patient  PLAN FOR NEXT SESSION: complete ROM and MMT measures (limited by time constraints at eval)  Olegario Messier, MS, OTR/L  10/22/2022, 3:30 PM

## 2022-10-22 NOTE — Therapy (Unsigned)
OUTPATIENT PHYSICAL THERAPY NEURO TREATMENT     Patient Name: [name] MRN: [id number] DOB:Jan 27, 1951, 72 y.o., female Today's Date: 10/22/2022   PCP:  Enid Baas, MD   REFERRING PROVIDER: Morene Crocker, MD   END OF SESSION:  PT End of Session - 10/22/22 1451     Visit Number 37    Number of Visits 44    Date for PT Re-Evaluation 11/10/22    Authorization Type [employer]    Progress Note Due on Visit 40    PT Start Time 1446    PT Stop Time 1528    PT Time Calculation (min) 42 min    Equipment Utilized During Treatment Gait belt    Activity Tolerance Patient tolerated treatment well;No increased pain    Behavior During Therapy [facility] for tasks assessed/performed                             Past Medical History:  Diagnosis Date   Abdominal pain 12/06/2013   Abnormality of gait 01/26/2013   Altered bowel habits 12/11/2017   Bruises easily    Constipation    Crohn's disease (HCC)    Depression    Dizziness and giddiness 01/26/2013   Fecal incontinence alternating with constipation 12/11/2017   GERD (gastroesophageal reflux disease)    Hemianopia    left   Hemiparesis and alteration of sensations as late effects of stroke (HCC) 01/26/2013   Hyperlipidemia    Hypertension    Hypothyroidism    Lump in female breast    Panic disorder    PONV (postoperative nausea and vomiting)    Stroke (HCC) 2009   Tremor, essential 04/24/2020   Type II diabetes mellitus (HCC)    Past Surgical History:  Procedure Laterality Date   ANAL FISSURE REPAIR  2008   APPENDECTOMY  1986   BREAST BIOPSY Left 2006   neg   BREAST LUMPECTOMY Right 1990's   CESAREAN SECTION  1981; 1983   CHOLECYSTECTOMY  1986   SMALL INTESTINE SURGERY  05/1991   TUBAL LIGATION  1983   Patient Active Problem List   Diagnosis Date Noted   Bone spur 06/13/2021   Acute CVA (cerebrovascular accident) (HCC) 03/07/2021   CVA (cerebral vascular accident) (HCC)  11/01/2020   Chronic pain 04/25/2020   Chronic constipation 04/25/2020   Gastro-esophageal reflux disease without esophagitis 04/25/2020   Hardening of the aorta (main artery of the heart) (HCC) 04/25/2020   History of iron deficiency anemia 04/25/2020   Hypercholesteremia 04/25/2020   Hypertension 04/25/2020   Personal history of transient ischemic attack (TIA), and cerebral infarction without residual deficits 04/25/2020   Pulmonary emphysema (HCC) 04/25/2020   Vitamin D deficiency 04/25/2020   Tremor, essential 04/24/2020   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   Right lower quadrant abdominal pain 08/18/2018   Spastic hemiplegia affecting nondominant side (HCC) 02/03/2014   Diabetes mellitus (HCC) 09/07/2013   Hypothyroidism 09/07/2013   Type 2 diabetes mellitus without complications (HCC) 09/07/2013   Hemiparesis and alteration of sensations as late effects of stroke (HCC) 01/26/2013   Unsteady gait 01/26/2013   Dizziness and giddiness 01/26/2013    ONSET DATE: 2009  REFERRING DIAG: Z86.73 (ICD-10-CM) - History of stroke   THERAPY DIAG:  Muscle weakness (generalized)  Difficulty in walking, not elsewhere classified  Other abnormalities of gait and mobility  Unsteadiness on feet  Abnormality of gait and mobility  Rationale for Evaluation and Treatment:  Rehabilitation  SUBJECTIVE:                                                                                                                                                                                             SUBJECTIVE STATEMENT:   Pt reports doing well today. She continues to have some discomfort putting on AFO but the overall comfort of AFO.  Patient reports OT appointment prior to this went well.    Pt accompanied by: self  PERTINENT HISTORY: Pt has had one major stroke and 5 mini strokes since 2009. Pt is here since her doctor wants her to work on her strength and balance and she also wants to  improve in these categories. Pt has hemianopsia with blindness inferior, left side and superior. Pt initially had stroke in 2009 and was previously able to walk for long distances. Pt has 650 feet driveway that is gravel and used to walk up and down with the cane. Pt has issues with depth perception. Pt is unable to tell angles of concrete and holes in the ground. Pt has service dogs that are retired, one for guidance and one for helping with picking up and retrieving items. Pt had most recent stroke in January 2023 that further limited her field of vision.  Patient wants to work diligently on her strength, balance, mobility, and in order to improve her ability to ambulate on various surfaces in various scenarios.  Patient wanted to come to outpatient services to utilize equipment to improve her strength and a greater capacity than home health was able to assist her with.  She is doing home exercise program involving mostly standing with upper extremity support and activities to improve her strength.  Patient reports she is very nervous to ambulate without contact-guard assist and gait belt without being able to hold onto a wall or steady surface in order to provide her with tactile cues for the areas around her.  PAIN:  Are you having pain? None  PRECAUTIONS: Fall, hemianopsia  WEIGHT BEARING RESTRICTIONS: No  FALLS: Has patient fallen in last 6 months? No  PATIENT GOALS: Improve gait, improve strength, walk on unsteady surfaces  OBJECTIVE:   TODAY'S TREATMENT:  DATE: 10/22/22 Unless otherwise stated, CGA was provided and gait belt donned in order to ensure pt safety  TA Ambulation in // bars x 3 laps of each  Forward/ backwards Lateral walking   STS x 10 with no UE assist, cues for LLE positioning to avoid pain with new AFO adjustment.   Donned 3# AW for the  following  - forward and retro ambulation x 4 laps of each  - standing reciprocal march 2 x 10 reps   Standing lateral step up 2 x 10 reps    NMR  LLE on airex and R LE on 4 inch step 3 x 1 min, multiple attempts at no UE and was able to hold 20 sec at one point with practice, otherwise intermittent UE use throughout.     PATIENT EDUCATION: Education details:Fear of falling and how this is a natural response and feeling toward higher level interventions.  Person educated: Patient Education method: Explanation Education comprehension: verbalized understanding  HOME EXERCISE PROGRAM:   Access Code: [email or web address] URL: [email or web address] Date: 07/31/2022 Prepared by: Grier Rocher  Exercises - Standing Tandem Balance with Counter Support  - 1 x daily - 3 x weekly - 2 sets - 2 reps - 10 hold - Standing Single Leg Stance with Counter Support  - 1 x daily - 3 x weekly - 2 sets - 2 reps - 5 hold  Updated 05/29/2022: Access Code: [email or web address] URL: [email or web address] Date: 05/29/2022 Prepared by: Temple Pacini  Exercises - Seated March  - 1 x daily - 5 x weekly - 3 sets - 10 reps - Seated Long Arc Quad  - 1 x daily - 7 x weekly - 3 sets - 10 reps -written on handout: seated core twists with holding YTB or RTB 5x weekly 3x10 each side  Previous: Pt performs leg lifts and squats with UE support    GOALS: Goals reviewed with patient? Yes  SHORT TERM GOALS: Target date: 06/24/2022    Patient will be independent in home exercise program to improve strength/mobility for better functional independence with ADLs. Baseline: No HEP currently  Goal status: INITIAL  LONG TERM GOALS: Target date: 08/19/2022  1.  Patient (> 65 years old) will complete five times sit to stand test in < 15 seconds indicating an increased LE strength and improved balance. Baseline:  Test second visit ; 05/29/2022: 40 sec pull to stance RUE 07/10/22: 22.82 sec. 08/18/22: 16.36 sec with  UE support 09/29/22:19.32 sec, 42 sec without UE assist  Goal status: ONGOING  2.  Patient will increase FOTO score by 8 or more points  to demonstrate statistically significant improvement in mobility and quality of life.  Baseline: Test second visit; 05/28/2040: 45; 07/10/22: 41, 08/18/22: 45 8/5:48 Goal status: ONGOING  3.  Patient will increase 10 meter walk test to >.38m/s as to improve gait speed for better community ambulation and to reduce fall risk. Baseline: Test visit 2 06/19/22: 53 sec 5/16: 77 sec. 08/18/22: 26.43 sec done with 3-wheel walker. 8/5:46 sec with SPC, 33 sec with RW Goal status: ONGOING   4. Patient will improve ability to ambulate on uneven surfaces as evidenced by ability to ambulate over simulated uneven terrain in clinic to improve independence with ambulation. Baseline: Patient not ambulating on uneven terrain. 08/18/22: pt ambulated across blue tri-fold PT mat using forearm cane, cane on ground next to mat. Two attempts done, pt hesitant and nervous for 1st attempt, ability to navigate over the mat improved upon 2nd attempt. 8/5:  not assessed secondary to getting new brace and not being comfortable with it at this time.  Goal status: ONGOING   ASSESSMENT:  CLINICAL IMPRESSION:  Pt presents with good motivation from completion of PT interventions.   Pt will continue to benefit from skilled physical therapy intervention to address impairments, improve QOL, and attain therapy goals.       OBJECTIVE IMPAIRMENTS: Abnormal gait, decreased activity tolerance, decreased balance, decreased endurance, decreased strength, decreased safety awareness, hypomobility, impaired flexibility, and impaired UE functional use.   ACTIVITY LIMITATIONS: lifting, bending, standing, stairs, transfers, bed mobility, and locomotion level  PARTICIPATION LIMITATIONS: meal prep, cleaning, shopping, and community activity  PERSONAL FACTORS: Time since onset of injury/illness/exacerbation and 3+  comorbidities: diabetes, HTN, Multiple previous strokes   are also affecting patient's functional outcome.   REHAB POTENTIAL: Fair secondary to time since onset and severely of visual deficits and L UE funcitonal use   CLINICAL DECISION MAKING: Stable/uncomplicated  EVALUATION COMPLEXITY: Low  PLAN:  PT FREQUENCY: 2x/week  PT DURATION: 12 weeks  PLANNED INTERVENTIONS: Therapeutic exercises, Therapeutic activity, Neuromuscular re-education, Balance training, Gait training, Patient/Family education, Self Care, Joint mobilization, Stair training, Moist heat, Manual therapy, and Re-evaluation  PLAN FOR NEXT SESSION:   Gait training on gravel and in parking lot to simulate home and community.  Eyes closed balance to harness vestibular and proprioceptive senses. Progression to ambulation on red mat.    Norman Herrlich PT ,DPT Physical Therapist- [place]  [facility]   3:14 PM 10/22/22

## 2022-10-23 ENCOUNTER — Encounter: Payer: Self-pay | Admitting: Physical Therapy

## 2022-10-25 ENCOUNTER — Other Ambulatory Visit: Payer: Self-pay

## 2022-10-27 ENCOUNTER — Other Ambulatory Visit: Payer: Self-pay

## 2022-10-28 ENCOUNTER — Other Ambulatory Visit: Payer: Self-pay

## 2022-10-28 MED ORDER — TRESIBA FLEXTOUCH 100 UNIT/ML ~~LOC~~ SOPN
30.0000 [IU] | PEN_INJECTOR | Freq: Every day | SUBCUTANEOUS | 11 refills | Status: DC
Start: 2022-10-28 — End: 2023-11-17
  Filled 2022-11-10: qty 15, 50d supply, fill #0
  Filled 2023-01-05: qty 15, 50d supply, fill #1
  Filled 2023-02-17: qty 15, 50d supply, fill #2
  Filled 2023-04-13: qty 15, 50d supply, fill #3
  Filled 2023-05-26: qty 15, 50d supply, fill #4
  Filled 2023-07-21: qty 15, 50d supply, fill #5
  Filled 2023-09-14: qty 15, 50d supply, fill #6
  Filled 2023-10-27: qty 15, 50d supply, fill #7

## 2022-10-29 ENCOUNTER — Ambulatory Visit: Payer: PPO | Admitting: Occupational Therapy

## 2022-10-29 ENCOUNTER — Ambulatory Visit: Payer: PPO | Attending: Neurology | Admitting: Physical Therapy

## 2022-10-29 ENCOUNTER — Other Ambulatory Visit: Payer: Self-pay

## 2022-10-29 ENCOUNTER — Encounter: Payer: Self-pay | Admitting: Physical Therapy

## 2022-10-29 DIAGNOSIS — M6281 Muscle weakness (generalized): Secondary | ICD-10-CM

## 2022-10-29 DIAGNOSIS — R269 Unspecified abnormalities of gait and mobility: Secondary | ICD-10-CM | POA: Diagnosis not present

## 2022-10-29 DIAGNOSIS — R2681 Unsteadiness on feet: Secondary | ICD-10-CM | POA: Diagnosis not present

## 2022-10-29 DIAGNOSIS — R262 Difficulty in walking, not elsewhere classified: Secondary | ICD-10-CM | POA: Diagnosis not present

## 2022-10-29 DIAGNOSIS — R278 Other lack of coordination: Secondary | ICD-10-CM | POA: Insufficient documentation

## 2022-10-29 DIAGNOSIS — R2689 Other abnormalities of gait and mobility: Secondary | ICD-10-CM | POA: Diagnosis not present

## 2022-10-29 MED ORDER — GABAPENTIN 300 MG PO CAPS
300.0000 mg | ORAL_CAPSULE | Freq: Three times a day (TID) | ORAL | 0 refills | Status: DC
  Filled 2022-10-29: qty 90, 30d supply, fill #0

## 2022-10-29 MED ORDER — GABAPENTIN 100 MG PO CAPS
ORAL_CAPSULE | ORAL | 1 refills | Status: DC
Start: 2022-10-29 — End: 2022-10-29
  Filled 2022-10-29: qty 120, 34d supply, fill #0

## 2022-10-29 NOTE — Therapy (Signed)
OUTPATIENT OCCUPATIONAL THERAPY NEURO TREATMENT NOTE  Patient Name: [name] MRN: [id number] DOB:28-Mar-1950, 72 y.o., female Today's Date: 10/29/2022  PCP: Dr. Enid Baas REFERRING PROVIDER: Dr. Theora Master  END OF SESSION:  OT End of Session - 10/29/22 1543     Visit Number 3    Number of Visits 24    Date for OT Re-Evaluation 01/12/23    Progress Note Due on Visit 10    OT Start Time 1445    OT Stop Time 1530    OT Time Calculation (min) 45 min    Activity Tolerance Patient tolerated treatment well    Behavior During Therapy [facility] for tasks assessed/performed              Past Medical History:  Diagnosis Date   Abdominal pain 12/06/2013   Abnormality of gait 01/26/2013   Altered bowel habits 12/11/2017   Bruises easily    Constipation    Crohn's disease (HCC)    Depression    Dizziness and giddiness 01/26/2013   Fecal incontinence alternating with constipation 12/11/2017   GERD (gastroesophageal reflux disease)    Hemianopia    left   Hemiparesis and alteration of sensations as late effects of stroke (HCC) 01/26/2013   Hyperlipidemia    Hypertension    Hypothyroidism    Lump in female breast    Panic disorder    PONV (postoperative nausea and vomiting)    Stroke (HCC) 2009   Tremor, essential 04/24/2020   Type II diabetes mellitus (HCC)    Past Surgical History:  Procedure Laterality Date   ANAL FISSURE REPAIR  2008   APPENDECTOMY  1986   BREAST BIOPSY Left 2006   neg   BREAST LUMPECTOMY Right 1990's   CESAREAN SECTION  1981; 1983   CHOLECYSTECTOMY  1986   SMALL INTESTINE SURGERY  05/1991   TUBAL LIGATION  1983   Patient Active Problem List   Diagnosis Date Noted   Bone spur 06/13/2021   Acute CVA (cerebrovascular accident) (HCC) 03/07/2021   CVA (cerebral vascular accident) (HCC) 11/01/2020   Chronic pain 04/25/2020   Chronic constipation 04/25/2020   Gastro-esophageal reflux disease without esophagitis 04/25/2020   Hardening of the  aorta (main artery of the heart) (HCC) 04/25/2020   History of iron deficiency anemia 04/25/2020   Hypercholesteremia 04/25/2020   Hypertension 04/25/2020   Personal history of transient ischemic attack (TIA), and cerebral infarction without residual deficits 04/25/2020   Pulmonary emphysema (HCC) 04/25/2020   Vitamin D deficiency 04/25/2020   Tremor, essential 04/24/2020   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   Right lower quadrant abdominal pain 08/18/2018   Spastic hemiplegia affecting nondominant side (HCC) 02/03/2014   Diabetes mellitus (HCC) 09/07/2013   Hypothyroidism 09/07/2013   Type 2 diabetes mellitus without complications (HCC) 09/07/2013   Hemiparesis and alteration of sensations as late effects of stroke (HCC) 01/26/2013   Unsteady gait 01/26/2013   Dizziness and giddiness 01/26/2013    ONSET DATE: 04/2007  REFERRING DIAG: I63.9 (ICD-10-CM) - CVA (cerebral vascular accident) (HCC)   THERAPY DIAG:  Muscle weakness (generalized)  Rationale for Evaluation and Treatment: Rehabilitation  SUBJECTIVE:  SUBJECTIVE STATEMENT:  Pt. reports she that she may look into Botox injections to help with the tone in her left UE.again.  Pt accompanied by: self  PERTINENT HISTORY: Hx of CVA in 2009 resulting in L sided hemiplegia and hemianopsia.  Pt reports hx of tremors in the R hand and has attempted use of heavy utensils and  weight on the wrist which did not feel was helpful.  No current medication for the tremor.  Pt reports she received a recent cortisone shot in the R hand for pain at the base of the LF and RF.  Pt had a more recent CVA in 2023 which further limited vision and weakness.  PRECAUTIONS: Fall  WEIGHT BEARING RESTRICTIONS: No  PAIN: Pt reports pain varies.  Are you having pain? No reports of pain  FALLS: Has patient fallen in last 6 months? No  LIVING ENVIRONMENT: Lives with: lives with their spouse Lives in: 1 level home Stairs:  ramp Has following  equipment at home: Single point cane, Walker - 4 wheeled, Shower bench, Grab bars, Ramped entry, and 3 wheeled walker, handicapped height toilet and grab bars by the toilet   PLOF: Independent prior to original CVA in 2009.   PATIENT GOALS: Increase LUE strength (pt feels she lost some strength she had gained after rehab from original stroke, as she had been able to use the LUE as a better stabilizer; see goals).  OBJECTIVE:  HAND DOMINANCE: Right  ADLs: Overall ADLs: Spouse assists  Transfers/ambulation related to ADLs: modified indep from wc  Eating: able to cut food with rocker knife Grooming: assist to apply deodorant under L arm d/t limited active L shoulder abd UB Dressing: occasional assist to don a shirt but pt can clasp a bra in the front and don overhead like a sports bra.  Pt reports she may start trying a sports bra as her L bra strap tends to fall.   LB Dressing: assist to manage clothing fasteners on a pair of pants  Toileting: modified indep Bathing: distant supv within her handicapped accessible shower Tub Shower transfers: modified indep Equipment:  see above  IADLs: Shopping: Spouse manages Light housekeeping: pt helps to load/unload dishwasher  Meal Prep: supv-modified indep with light cold meal prep Community mobility: wc; unable to drive Medication management: indep Landscape architect: spouse manages Handwriting:  NT; pt is R hand dominant  MOBILITY STATUS:  uses manual wc; limited ambulation with AD  POSTURE COMMENTS:  L sided hemiplegia  ACTIVITY TOLERANCE: Activity tolerance: to be further assessed  FUNCTIONAL OUTCOME MEASURES: FOTO: 29; predicted 36  UPPER EXTREMITY ROM:  Time constraints limited additional measurements; will complete next session  Active ROM Right Eval WNL Left eval  Shoulder flexion   20  Shoulder abduction  66  Shoulder adduction    Shoulder extension    Shoulder internal rotation    Shoulder external rotation     Elbow flexion    Elbow extension  -55  Wrist flexion    Wrist extension  -6  Wrist ulnar deviation    Wrist radial deviation    Wrist pronation    Wrist supination  70  (Blank rows = not tested)  UPPER EXTREMITY MMT: To be tested next session  MMT Right eval Left eval  Shoulder flexion  2-/5  Shoulder abduction  2/5  Shoulder adduction    Shoulder extension    Shoulder internal rotation    Shoulder external rotation    Middle trapezius    Lower trapezius    Elbow flexion    Elbow extension  2/5  Wrist flexion    Wrist extension  2-/5  Wrist ulnar deviation    Wrist radial deviation    Wrist pronation    Wrist supination  3-/5  (Blank rows = not tested)  HAND FUNCTION: Grip strength: Right: 18 lbs; Left:  4 lbs, Lateral pinch: Right: 13 lbs, Left: 6 lbs, and 3 point pinch: Right: 11 lbs, Left: unable lbs  COORDINATION:  9 Hole Peg test: Right: NT sec; Left: NT/unable sec  SENSATION: Light touch: WFL  EDEMA: No visible edema  MUSCLE TONE: LUE: Severe spasticity  COGNITION: Overall cognitive status: Within functional limits for tasks assessed  VISION: Baseline vision: Wears glasses all the time  VISION ASSESSMENT: L hemianopsia following CVA in 2009  PERCEPTION: Not tested  PRAXIS: Impaired: Motor planning  OBSERVATIONS:  Pt pleasant and cooperative.  Verbalizes desire to regain some strength in the LUE, but appropriate expectation for goal to use L hand/arm as a better stabilizer for ADL supplies.  TODAY'S TREATMENT:                                                                                                                              DATE: 10/29/22  Therapeutic Ex:  Pt. tolerated PROM, slow gentle prolonged stretching in joint ranges of the LUE for shoulder flexion, abduction, elbow flexion, and extension, forearm supination, wrist flexion, extension, and digit MP, PIP, and DIP flexion, and extension. Pt. worked on hand to face patterns, and  diagonal patterns of movement with hand over hand assist.  Manual Therapy:  Pt. tolerated soft tissue massage to the scapular, and shoulder musculature, scapular mobilizations for elevation, depression, and abduction/rotation to normalize tone, and prepare the UE for ROM. Manual therapy was performed independent of, and in preparation for therapeutic Ex.        PATIENT EDUCATION: Education details:  Left UE tone, Botox, Right 3rd digit Oval-8 splint Person educated: Patient Education method: Explanation Education comprehension: verbalized understanding  HOME EXERCISE PROGRAM: TBD   GOALS: Goals reviewed with patient? Yes  SHORT TERM GOALS: Target date: 12/01/22 (6 weeks)  Pt will be indep to perform HEP for improving LUE strength for stabilizing ADL supplies. Baseline: Eval: HEP not yet initiated Goal status: INITIAL  LONG TERM GOALS: Target date: 01/12/23 (12 weeks)  Pt will increase FOTO score to 36 or better to indicate improvement in self perceived functional use of the L arm with daily tasks. Baseline: Eval: 29 Goal status: INITIAL  2.  Pt will increase L grip strength by 5 or more to be able to stabilize a food can in the L hand while the R hand manipulates a can opener. Baseline: Eval: L grip 4 lbs (pt reports previously able to manage this task, but unable over the last several years) Goal status: INITIAL  3.  Pt will be indep with LUE positioning strategies to promote good skin integrity beneath L axilla/arm/L side of trunk Baseline: Eval: Pt reports excessive sweating beneath L axilla and difficulty maintaining LUE into slight abd; initiated educ on wc arm trough or pillow placement while seated in wc Goal status: INITIAL  4.  Pt will increase LUE strength grossly 1/2 muscle grade in order to stabilize ADL supplies against trunk and  improve ability to reposition LUE during self care tasks. Baseline: Eval: Left shoulder flexion: 2-/5, abduction: 2/5, elbow  extension: 2/5, wrist extension: 3-/5 Goal status: INITIAL  5.  Pt will don/doff a sports bra with modified indep. Baseline: Eval: pt reports L bra strap routinely falls and she may try a sports bra Goal status: INITIAL  ASSESSMENT:  CLINICAL IMPRESSION:  Pt. continues to present with increased flexor tone, and tightness in the LUE, and hand which limits overall ROM, and engagement during functional ADL, and IADL tasks Pt. Presents with increased to tone in the elbow flexors limiting extension. Pt. reports having had an injection in her right hand on 10/03/2022 for multiple trigger fingers. Pt. reports that her right 3rd finger continues to trigger in the morning, and is painful. Pt. was fit for, and provided with a size 11 Oval-8 trigger finger splint to wear in the morning when triggering is most apparent, and painful. Pt. responded well to treatment session, and moist heat, however continues to present spasticity. Pt continues to benefit from skilled OT to provide therapeutic exercises, neuromuscular re-education, therapeutic activities, and ADL training in order to maximize indep with daily tasks.     PERFORMANCE DEFICITS: in functional skills including ADLs, IADLs, coordination, dexterity, tone, ROM, strength, flexibility, Fine motor control, Gross motor control, hearing, mobility, balance, body mechanics, endurance, decreased knowledge of use of DME, skin integrity, vision, and UE functional use, and psychosocial skills including coping strategies, environmental adaptation, habits, interpersonal interactions, and routines and behaviors.   IMPAIRMENTS: are limiting patient from ADLs and IADLs.   CO-MORBIDITIES: has co-morbidities such as hemianopsia, emphysema, tremor, spastic hemiplegia  that affects occupational performance. Patient will benefit from skilled OT to address above impairments and improve overall function.  MODIFICATION OR ASSISTANCE TO COMPLETE EVALUATION: Min-Moderate  modification of tasks or assist with assess necessary to complete an evaluation.  OT OCCUPATIONAL PROFILE AND HISTORY: Problem focused assessment: Including review of records relating to presenting problem.  CLINICAL DECISION MAKING: Moderate - several treatment options, min-mod task modification necessary  REHAB POTENTIAL: Good for goals  EVALUATION COMPLEXITY: Moderate    PLAN:  OT FREQUENCY: 2x/week  OT DURATION: 12 weeks  PLANNED INTERVENTIONS: self care/ADL training, therapeutic exercise, therapeutic activity, neuromuscular re-education, manual therapy, passive range of motion, splinting, moist heat, cryotherapy, patient/family education, visual/perceptual remediation/compensation, psychosocial skills training, energy conservation, coping strategies training, and DME and/or AE instructions  RECOMMENDED OTHER SERVICES: None at this time  CONSULTED AND AGREED WITH PLAN OF CARE: Patient  PLAN FOR NEXT SESSION: complete ROM and MMT measures (limited by time constraints at eval)  Olegario Messier, MS, OTR/L  10/29/2022, 3:49 PM

## 2022-10-29 NOTE — Therapy (Signed)
OUTPATIENT PHYSICAL THERAPY NEURO TREATMENT     Patient Name: [name] MRN: [id number] DOB:06-Feb-1951, 72 y.o., female Today's Date: 10/29/2022   PCP:  Enid Baas, MD   REFERRING PROVIDER: Morene Crocker, MD   END OF SESSION:  PT End of Session - 10/29/22 1427     Visit Number 38    Number of Visits 44    Date for PT Re-Evaluation 11/10/22    Authorization Type [employer]    Progress Note Due on Visit 40    PT Start Time 1531    PT Stop Time 1613    PT Time Calculation (min) 42 min    Equipment Utilized During Treatment Gait belt    Activity Tolerance Patient tolerated treatment well;No increased pain    Behavior During Therapy [facility] for tasks assessed/performed                             Past Medical History:  Diagnosis Date   Abdominal pain 12/06/2013   Abnormality of gait 01/26/2013   Altered bowel habits 12/11/2017   Bruises easily    Constipation    Crohn's disease (HCC)    Depression    Dizziness and giddiness 01/26/2013   Fecal incontinence alternating with constipation 12/11/2017   GERD (gastroesophageal reflux disease)    Hemianopia    left   Hemiparesis and alteration of sensations as late effects of stroke (HCC) 01/26/2013   Hyperlipidemia    Hypertension    Hypothyroidism    Lump in female breast    Panic disorder    PONV (postoperative nausea and vomiting)    Stroke (HCC) 2009   Tremor, essential 04/24/2020   Type II diabetes mellitus (HCC)    Past Surgical History:  Procedure Laterality Date   ANAL FISSURE REPAIR  2008   APPENDECTOMY  1986   BREAST BIOPSY Left 2006   neg   BREAST LUMPECTOMY Right 1990's   CESAREAN SECTION  1981; 1983   CHOLECYSTECTOMY  1986   SMALL INTESTINE SURGERY  05/1991   TUBAL LIGATION  1983   Patient Active Problem List   Diagnosis Date Noted   Bone spur 06/13/2021   Acute CVA (cerebrovascular accident) (HCC) 03/07/2021   CVA (cerebral vascular accident) (HCC)  11/01/2020   Chronic pain 04/25/2020   Chronic constipation 04/25/2020   Gastro-esophageal reflux disease without esophagitis 04/25/2020   Hardening of the aorta (main artery of the heart) (HCC) 04/25/2020   History of iron deficiency anemia 04/25/2020   Hypercholesteremia 04/25/2020   Hypertension 04/25/2020   Personal history of transient ischemic attack (TIA), and cerebral infarction without residual deficits 04/25/2020   Pulmonary emphysema (HCC) 04/25/2020   Vitamin D deficiency 04/25/2020   Tremor, essential 04/24/2020   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   Right lower quadrant abdominal pain 08/18/2018   Spastic hemiplegia affecting nondominant side (HCC) 02/03/2014   Diabetes mellitus (HCC) 09/07/2013   Hypothyroidism 09/07/2013   Type 2 diabetes mellitus without complications (HCC) 09/07/2013   Hemiparesis and alteration of sensations as late effects of stroke (HCC) 01/26/2013   Unsteady gait 01/26/2013   Dizziness and giddiness 01/26/2013    ONSET DATE: 2009  REFERRING DIAG: Z86.73 (ICD-10-CM) - History of stroke   THERAPY DIAG:  Muscle weakness (generalized)  Difficulty in walking, not elsewhere classified  Other abnormalities of gait and mobility  Unsteadiness on feet  Abnormality of gait and mobility  Rationale for Evaluation and Treatment:  Rehabilitation  SUBJECTIVE:                                                                                                                                                                                             SUBJECTIVE STATEMENT:   Pt reports doing well today. No changes since last session. Thinks she needs to work on her confidence with walking and thinking about it less.   Pt accompanied by: self  PERTINENT HISTORY: Pt has had one major stroke and 5 mini strokes since 2009. Pt is here since her doctor wants her to work on her strength and balance and she also wants to improve in these categories. Pt  has hemianopsia with blindness inferior, left side and superior. Pt initially had stroke in 2009 and was previously able to walk for long distances. Pt has 650 feet driveway that is gravel and used to walk up and down with the cane. Pt has issues with depth perception. Pt is unable to tell angles of concrete and holes in the ground. Pt has service dogs that are retired, one for guidance and one for helping with picking up and retrieving items. Pt had most recent stroke in January 2023 that further limited her field of vision.  Patient wants to work diligently on her strength, balance, mobility, and in order to improve her ability to ambulate on various surfaces in various scenarios.  Patient wanted to come to outpatient services to utilize equipment to improve her strength and a greater capacity than home health was able to assist her with.  She is doing home exercise program involving mostly standing with upper extremity support and activities to improve her strength.  Patient reports she is very nervous to ambulate without contact-guard assist and gait belt without being able to hold onto a wall or steady surface in order to provide her with tactile cues for the areas around her.  PAIN:  Are you having pain? None  PRECAUTIONS: Fall, hemianopsia  WEIGHT BEARING RESTRICTIONS: No  FALLS: Has patient fallen in last 6 months? No  PATIENT GOALS: Improve gait, improve strength, walk on unsteady surfaces  OBJECTIVE:   TODAY'S TREATMENT:  DATE: 10/29/22 Unless otherwise stated, CGA was provided and gait belt donned in order to ensure pt safety  TA Ambulation outside on sidewalks as well as inclines declines and other terrain changes.  Did not ambulate in grass or gravel secondary to patient hide difficulty and high anxiety with ambulating on these terrains at this time. X 2 rounds  15 minutes each time  -challenged to continue walking and talking throughout the ambulatory bouts. Pt requires many cues to prevent   STS x 1 with no UE assist from park style bench   Pt required occasional therapeutic rest breaks due fatigue, PT was quick to ask when pt appeared to be fatiguing in order to prevent excessive fatigue.    PATIENT EDUCATION: Education details:Fear of falling and how this is a natural response and feeling toward higher level interventions.  Person educated: Patient Education method: Explanation Education comprehension: verbalized understanding  HOME EXERCISE PROGRAM:   Access Code: [email or web address] URL: [email or web address] Date: 07/31/2022 Prepared by: Grier Rocher  Exercises - Standing Tandem Balance with Counter Support  - 1 x daily - 3 x weekly - 2 sets - 2 reps - 10 hold - Standing Single Leg Stance with Counter Support  - 1 x daily - 3 x weekly - 2 sets - 2 reps - 5 hold  Updated 05/29/2022: Access Code: [email or web address] URL: [email or web address] Date: 05/29/2022 Prepared by: Temple Pacini  Exercises - Seated March  - 1 x daily - 5 x weekly - 3 sets - 10 reps - Seated Long Arc Quad  - 1 x daily - 7 x weekly - 3 sets - 10 reps -written on handout: seated core twists with holding YTB or RTB 5x weekly 3x10 each side  Previous: Pt performs leg lifts and squats with UE support    GOALS: Goals reviewed with patient? Yes  SHORT TERM GOALS: Target date: 06/24/2022    Patient will be independent in home exercise program to improve strength/mobility for better functional independence with ADLs. Baseline: No HEP currently  Goal status: INITIAL  LONG TERM GOALS: Target date: 08/19/2022  1.  Patient (> 10 years old) will complete five times sit to stand test in < 15 seconds indicating an increased LE strength and improved balance. Baseline:  Test second visit ; 05/29/2022: 40 sec pull to stance RUE 07/10/22: 22.82 sec. 08/18/22: 16.36  sec with UE support 09/29/22:19.32 sec, 42 sec without UE assist  Goal status: ONGOING  2.  Patient will increase FOTO score by 8 or more points  to demonstrate statistically significant improvement in mobility and quality of life.  Baseline: Test second visit; 05/28/2040: 45; 07/10/22: 41, 08/18/22: 45 8/5:48 Goal status: ONGOING  3.  Patient will increase 10 meter walk test to >.28m/s as to improve gait speed for better community ambulation and to reduce fall risk. Baseline: Test visit 2 06/19/22: 53 sec 5/16: 77 sec. 08/18/22: 26.43 sec done with 3-wheel walker. 8/5:46 sec with SPC, 33 sec with RW Goal status: ONGOING   4. Patient will improve ability to ambulate on uneven surfaces as evidenced by ability to ambulate over simulated uneven terrain in clinic to improve independence with ambulation. Baseline: Patient not ambulating on uneven terrain. 08/18/22: pt ambulated across blue tri-fold PT mat using forearm cane, cane on ground next to mat. Two attempts done, pt hesitant and nervous for 1st attempt, ability to navigate over the mat improved upon 2nd attempt. 8/5: not assessed secondary to getting new brace and not being comfortable with it  at this time.  Goal status: ONGOING   ASSESSMENT:  CLINICAL IMPRESSION:  Patient presents with good motivation for completion of physical therapy activities.  Physical therapist spent majority of time ambulating outside with patient in order to improve patient's ability to ambulate functionally within the community working on a variety of grades including incline decline as well as a variety of different sidewalks to allow patient to better adjust to different environment she may encounter if she is ambulating outside of her home.  Patient overall responded well but is continues to have a slow speed of ambulation when ambulating outside. Pt will continue to benefit from skilled physical therapy intervention to address impairments, improve QOL, and attain therapy  goals.       OBJECTIVE IMPAIRMENTS: Abnormal gait, decreased activity tolerance, decreased balance, decreased endurance, decreased strength, decreased safety awareness, hypomobility, impaired flexibility, and impaired UE functional use.   ACTIVITY LIMITATIONS: lifting, bending, standing, stairs, transfers, bed mobility, and locomotion level  PARTICIPATION LIMITATIONS: meal prep, cleaning, shopping, and community activity  PERSONAL FACTORS: Time since onset of injury/illness/exacerbation and 3+ comorbidities: diabetes, HTN, Multiple previous strokes   are also affecting patient's functional outcome.   REHAB POTENTIAL: Fair secondary to time since onset and severely of visual deficits and L UE funcitonal use   CLINICAL DECISION MAKING: Stable/uncomplicated  EVALUATION COMPLEXITY: Low  PLAN:  PT FREQUENCY: 2x/week  PT DURATION: 12 weeks  PLANNED INTERVENTIONS: Therapeutic exercises, Therapeutic activity, Neuromuscular re-education, Balance training, Gait training, Patient/Family education, Self Care, Joint mobilization, Stair training, Moist heat, Manual therapy, and Re-evaluation  PLAN FOR NEXT SESSION:   Gait training on gravel and in parking lot to simulate home and community.  Eyes closed balance to harness vestibular and proprioceptive senses. Progression to ambulation on red mat.  Ambulation while speaking ( not stopping ambulating when talking)    Norman Herrlich PT ,DPT Physical Therapist- [place]  [facility]   4:16 PM 10/29/22

## 2022-10-30 ENCOUNTER — Telehealth: Payer: Self-pay | Admitting: Podiatry

## 2022-10-30 NOTE — Telephone Encounter (Signed)
Patient called stating she has been trying to get diabetic shoes from [employer]. Per pt they are stating they need Dr Logan Bores note to be very specific on what her deformities are that she needs diabetic shoes. Please fax to [facility] when done.

## 2022-10-31 ENCOUNTER — Other Ambulatory Visit: Payer: Self-pay

## 2022-10-31 MED ORDER — GABAPENTIN 100 MG PO CAPS
100.0000 mg | ORAL_CAPSULE | Freq: Every day | ORAL | 0 refills | Status: DC
  Filled 2022-10-31: qty 90, 90d supply, fill #0

## 2022-10-31 NOTE — Telephone Encounter (Signed)
Faxed to [facility] [phone].Called Mrs  and let her know it was done. She states she will follow up with [facility].

## 2022-11-03 ENCOUNTER — Ambulatory Visit: Payer: PPO

## 2022-11-03 ENCOUNTER — Other Ambulatory Visit: Payer: Self-pay

## 2022-11-03 ENCOUNTER — Ambulatory Visit: Payer: PPO | Admitting: Physical Therapy

## 2022-11-03 DIAGNOSIS — R278 Other lack of coordination: Secondary | ICD-10-CM

## 2022-11-03 DIAGNOSIS — R262 Difficulty in walking, not elsewhere classified: Secondary | ICD-10-CM

## 2022-11-03 DIAGNOSIS — R269 Unspecified abnormalities of gait and mobility: Secondary | ICD-10-CM

## 2022-11-03 DIAGNOSIS — M6281 Muscle weakness (generalized): Secondary | ICD-10-CM

## 2022-11-03 DIAGNOSIS — R2681 Unsteadiness on feet: Secondary | ICD-10-CM

## 2022-11-03 DIAGNOSIS — R2689 Other abnormalities of gait and mobility: Secondary | ICD-10-CM

## 2022-11-03 MED ORDER — LEVOTHYROXINE SODIUM 125 MCG PO TABS
125.0000 ug | ORAL_TABLET | Freq: Every day | ORAL | 1 refills | Status: DC
  Filled 2022-11-03: qty 90, 90d supply, fill #0
  Filled 2023-02-02: qty 90, 90d supply, fill #1

## 2022-11-03 MED ORDER — DULAGLUTIDE 1.5 MG/0.5ML ~~LOC~~ SOAJ
1.5000 mg | SUBCUTANEOUS | 11 refills | Status: DC
Start: 2022-11-03 — End: 2023-10-07
  Filled 2022-11-03: qty 2, 28d supply, fill #0
  Filled 2022-11-10 – 2022-12-08 (×4): qty 2, 28d supply, fill #1
  Filled 2023-01-05: qty 2, 28d supply, fill #2
  Filled 2023-01-29: qty 2, 28d supply, fill #3
  Filled 2023-03-02 – 2023-03-06 (×3): qty 2, 28d supply, fill #4
  Filled 2023-03-30: qty 2, 28d supply, fill #5
  Filled 2023-04-26: qty 2, 28d supply, fill #6
  Filled 2023-05-26: qty 2, 28d supply, fill #7
  Filled 2023-06-22: qty 2, 28d supply, fill #8
  Filled 2023-07-21: qty 2, 28d supply, fill #9
  Filled 2023-08-16: qty 2, 28d supply, fill #10
  Filled 2023-09-11: qty 2, 28d supply, fill #11

## 2022-11-03 NOTE — Therapy (Signed)
OUTPATIENT PHYSICAL THERAPY NEURO TREATMENT     Patient Name: [name] MRN: [id number] DOB:[birth year], 72 y.o., female Today's Date: 11/03/2022   PCP:  Enid Baas, MD   REFERRING PROVIDER: Morene Crocker, MD   END OF SESSION:  PT End of Session - 11/03/22 1313     Visit Number 39    Number of Visits 44    Date for PT Re-Evaluation 11/10/22    Authorization Type [employer]    Progress Note Due on Visit 40    PT Start Time 1317    PT Stop Time 1358    PT Time Calculation (min) 41 min    Equipment Utilized During Treatment Gait belt    Activity Tolerance Patient tolerated treatment well;No increased pain    Behavior During Therapy [facility] for tasks assessed/performed                             Past Medical History:  Diagnosis Date   Abdominal pain 12/06/2013   Abnormality of gait 01/26/2013   Altered bowel habits 12/11/2017   Bruises easily    Constipation    Crohn's disease (HCC)    Depression    Dizziness and giddiness 01/26/2013   Fecal incontinence alternating with constipation 12/11/2017   GERD (gastroesophageal reflux disease)    Hemianopia    left   Hemiparesis and alteration of sensations as late effects of stroke (HCC) 01/26/2013   Hyperlipidemia    Hypertension    Hypothyroidism    Lump in female breast    Panic disorder    PONV (postoperative nausea and vomiting)    Stroke (HCC) 2009   Tremor, essential 04/24/2020   Type II diabetes mellitus (HCC)    Past Surgical History:  Procedure Laterality Date   ANAL FISSURE REPAIR  2008   APPENDECTOMY  1986   BREAST BIOPSY Left 2006   neg   BREAST LUMPECTOMY Right 1990's   CESAREAN SECTION  1981; 1983   CHOLECYSTECTOMY  1986   SMALL INTESTINE SURGERY  05/1991   TUBAL LIGATION  1983   Patient Active Problem List   Diagnosis Date Noted   Bone spur 06/13/2021   Acute CVA (cerebrovascular accident) (HCC) 03/07/2021   CVA (cerebral vascular accident) (HCC)  11/01/2020   Chronic pain 04/25/2020   Chronic constipation 04/25/2020   Gastro-esophageal reflux disease without esophagitis 04/25/2020   Hardening of the aorta (main artery of the heart) (HCC) 04/25/2020   History of iron deficiency anemia 04/25/2020   Hypercholesteremia 04/25/2020   Hypertension 04/25/2020   Personal history of transient ischemic attack (TIA), and cerebral infarction without residual deficits 04/25/2020   Pulmonary emphysema (HCC) 04/25/2020   Vitamin D deficiency 04/25/2020   Tremor, essential 04/24/2020   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  Rationale for Evaluation and Treatment: Rehabilitation  SUBJECTIVE:  SUBJECTIVE STATEMENT:   Pt reports doing well today. Has some questions regarding getting onto an ellyptical. PT informs her he thinks this may not be the most appropriate device but could give it a try in the right conditions. She also has questions regarding her ability. Pt wants to manage stairs and be able to transition to standing better.   Pt accompanied by: self  PERTINENT HISTORY: Pt has had one major stroke and 5 mini strokes since 2009. Pt is here since her doctor wants her to work on her strength and balance and she also wants to improve in these  categories. Pt has hemianopsia with blindness inferior, left side and superior. Pt initially had stroke in 2009 and was previously able to walk for long distances. Pt has 650 feet driveway that is gravel and used to walk up and down with the cane. Pt has issues with depth perception. Pt is unable to tell angles of concrete and holes in the ground. Pt has service dogs that are retired, one for guidance and one for helping with picking up and retrieving items. Pt had most recent stroke in January 2023 that further limited her field of vision.  Patient wants to work diligently on her strength, balance, mobility, and in order to improve her ability to ambulate on various surfaces in various scenarios.  Patient wanted to come to outpatient services to utilize equipment to improve her strength and a greater capacity than home health was able to assist her with.  She is doing home exercise program involving mostly standing with upper extremity support and activities to improve her strength.  Patient reports she is very nervous to ambulate without contact-guard assist and gait belt without being able to hold onto a wall or steady surface in order to provide her with tactile cues for the areas around her.  PAIN:  Are you having pain? None  PRECAUTIONS: Fall, hemianopsia  WEIGHT BEARING RESTRICTIONS: No  FALLS: Has patient fallen in last 6 months? No  PATIENT GOALS: Improve gait, improve strength, walk on unsteady surfaces  OBJECTIVE:   TODAY'S TREATMENT:                                                                                                                              DATE: 11/03/22 Unless otherwise stated, CGA was provided and gait belt donned in order to ensure pt safety  TA  Ambulation training with forearm cane  - x 7 laps with each lab 35 ft - goal of increasing step length on the contralateral and uninvolved side to improve gait efficiency and speed.  - a few LOB with step through  pattern of gait.   Octane for trial for home based elyptical use, difficulty getting on / off x 8 min ( multiple adjustments throughout for optimal positioning)   STS x 10 without UE assist   5X STS 19 sec no UE assist   Pt required occasional rest breaks  due fatigue, PT was quick to ask when pt appeared to be fatiguing in order to prevent excessive fatigue.   PATIENT EDUCATION: Education details:Fear of falling and how this is a natural response and feeling toward higher level interventions.  Person educated: Patient Education method: Explanation Education comprehension: verbalized understanding  HOME EXERCISE PROGRAM:   Access Code: [email or web address] URL: [email or web address] Date: 07/31/2022 Prepared by: Grier Rocher  Exercises - Standing Tandem Balance with Counter Support  - 1 x daily - 3 x weekly - 2 sets - 2 reps - 10 hold - Standing Single Leg Stance with Counter Support  - 1 x daily - 3 x weekly - 2 sets - 2 reps - 5 hold  Updated 05/29/2022: Access Code: [email or web address] URL: [email or web address] Date: 05/29/2022 Prepared by: Temple Pacini  Exercises - Seated March  - 1 x daily - 5 x weekly - 3 sets - 10 reps - Seated Long Arc Quad  - 1 x daily - 7 x weekly - 3 sets - 10 reps -written on handout: seated core twists with holding YTB or RTB 5x weekly 3x10 each side  Previous: Pt performs leg lifts and squats with UE support    GOALS: Goals reviewed with patient? Yes  SHORT TERM GOALS: Target date: 06/24/2022    Patient will be independent in home exercise program to improve strength/mobility for better functional independence with ADLs. Baseline: No HEP currently  Goal status: INITIAL  LONG TERM GOALS: Target date: 08/19/2022  1.  Patient (> 18 years old) will complete five times sit to stand test in < 15 seconds indicating an increased LE strength and improved balance. Baseline:  Test second visit ; 05/29/2022: 40 sec pull to stance RUE 07/10/22:  22.82 sec. 08/18/22: 16.36 sec with UE support 09/29/22:19.32 sec, 42 sec without UE assist  9/9:19.86 sec no UE assist  Goal status: ONGOING/ PROGRESSING  2.  Patient will increase FOTO score by 8 or more points  to demonstrate statistically significant improvement in mobility and quality of life.  Baseline: Test second visit; 05/28/2040: 45; 07/10/22: 41, 08/18/22: 45 8/5:48 Goal status: ONGOING  3.  Patient will increase 10 meter walk test to >.64m/s as to improve gait speed for better community ambulation and to reduce fall risk. Baseline: Test visit 2 06/19/22: 53 sec 5/16: 77 sec. 08/18/22: 26.43 sec done with 3-wheel walker. 8/5:46 sec with SPC, 33 sec with RW Goal status: ONGOING   4. Patient will improve ability to ambulate on uneven surfaces as evidenced by ability to ambulate over simulated uneven terrain in clinic to improve independence with ambulation. Baseline: Patient not ambulating on uneven terrain. 08/18/22: pt ambulated across blue tri-fold PT mat using forearm cane, cane on ground next to mat. Two attempts done, pt hesitant and nervous for 1st attempt, ability to navigate over the mat improved upon 2nd attempt. 8/5: not assessed secondary to getting new brace and not being comfortable with it at this time.  Goal status: ONGOING   ASSESSMENT:  CLINICAL IMPRESSION:  Patient presents with good motivation for completion of physical therapy activities. Pt progressed with ambulation working toward step through pattern of gait with some success but a few instances of imbalance at times. Pt shows great improvement with 5X STS this date without UE assist.  Pt will continue to benefit from skilled physical therapy intervention to address impairments, improve QOL, and attain therapy goals.       OBJECTIVE IMPAIRMENTS: Abnormal gait, decreased activity tolerance, decreased balance, decreased endurance, decreased  strength, decreased safety awareness, hypomobility, impaired flexibility, and  impaired UE functional use.   ACTIVITY LIMITATIONS: lifting, bending, standing, stairs, transfers, bed mobility, and locomotion level  PARTICIPATION LIMITATIONS: meal prep, cleaning, shopping, and community activity  PERSONAL FACTORS: Time since onset of injury/illness/exacerbation and 3+ comorbidities: diabetes, HTN, Multiple previous strokes   are also affecting patient's functional outcome.   REHAB POTENTIAL: Fair secondary to time since onset and severely of visual deficits and L UE funcitonal use   CLINICAL DECISION MAKING: Stable/uncomplicated  EVALUATION COMPLEXITY: Low  PLAN:  PT FREQUENCY: 2x/week  PT DURATION: 12 weeks  PLANNED INTERVENTIONS: Therapeutic exercises, Therapeutic activity, Neuromuscular re-education, Balance training, Gait training, Patient/Family education, Self Care, Joint mobilization, Stair training, Moist heat, Manual therapy, and Re-evaluation  PLAN FOR NEXT SESSION:   Gait training on gravel and in parking lot to simulate home and community.  Eyes closed balance to harness vestibular and proprioceptive senses. Progression to ambulation on red mat.  Ambulation while speaking ( not stopping ambulating when talking)    Norman Herrlich PT ,DPT Physical Therapist- [facility]  [facility]   2:36 PM 11/03/22

## 2022-11-04 DIAGNOSIS — H547 Unspecified visual loss: Secondary | ICD-10-CM | POA: Diagnosis not present

## 2022-11-04 DIAGNOSIS — R262 Difficulty in walking, not elsewhere classified: Secondary | ICD-10-CM | POA: Diagnosis not present

## 2022-11-04 DIAGNOSIS — Z7409 Other reduced mobility: Secondary | ICD-10-CM | POA: Diagnosis not present

## 2022-11-04 DIAGNOSIS — R2681 Unsteadiness on feet: Secondary | ICD-10-CM | POA: Diagnosis not present

## 2022-11-04 DIAGNOSIS — R200 Anesthesia of skin: Secondary | ICD-10-CM | POA: Diagnosis not present

## 2022-11-04 DIAGNOSIS — R519 Headache, unspecified: Secondary | ICD-10-CM | POA: Diagnosis not present

## 2022-11-04 DIAGNOSIS — R202 Paresthesia of skin: Secondary | ICD-10-CM | POA: Diagnosis not present

## 2022-11-04 DIAGNOSIS — R269 Unspecified abnormalities of gait and mobility: Secondary | ICD-10-CM | POA: Diagnosis not present

## 2022-11-04 DIAGNOSIS — G250 Essential tremor: Secondary | ICD-10-CM | POA: Diagnosis not present

## 2022-11-04 DIAGNOSIS — I69359 Hemiplegia and hemiparesis following cerebral infarction affecting unspecified side: Secondary | ICD-10-CM | POA: Diagnosis not present

## 2022-11-06 ENCOUNTER — Ambulatory Visit: Payer: PPO | Admitting: Physical Therapy

## 2022-11-06 DIAGNOSIS — R2681 Unsteadiness on feet: Secondary | ICD-10-CM

## 2022-11-06 DIAGNOSIS — M6281 Muscle weakness (generalized): Secondary | ICD-10-CM

## 2022-11-06 DIAGNOSIS — R269 Unspecified abnormalities of gait and mobility: Secondary | ICD-10-CM

## 2022-11-06 DIAGNOSIS — R262 Difficulty in walking, not elsewhere classified: Secondary | ICD-10-CM

## 2022-11-06 DIAGNOSIS — R2689 Other abnormalities of gait and mobility: Secondary | ICD-10-CM

## 2022-11-06 NOTE — Therapy (Signed)
OUTPATIENT OCCUPATIONAL THERAPY NEURO TREATMENT NOTE  Patient Name: [name] MRN: [id number] DOB:January 19, 1951, 72 y.o., female Today's Date: 11/06/2022  PCP: Dr. Enid Baas REFERRING PROVIDER: Dr. Theora Master  END OF SESSION:  OT End of Session - 11/06/22 0913     Visit Number 4    Number of Visits 24    Date for OT Re-Evaluation 01/12/23    Progress Note Due on Visit 10    OT Start Time 1400    OT Stop Time 1445    OT Time Calculation (min) 45 min    Equipment Utilized During Treatment wc    Activity Tolerance Patient tolerated treatment well    Behavior During Therapy [facility] for tasks assessed/performed            Past Medical History:  Diagnosis Date   Abdominal pain 12/06/2013   Abnormality of gait 01/26/2013   Altered bowel habits 12/11/2017   Bruises easily    Constipation    Crohn's disease (HCC)    Depression    Dizziness and giddiness 01/26/2013   Fecal incontinence alternating with constipation 12/11/2017   GERD (gastroesophageal reflux disease)    Hemianopia    left   Hemiparesis and alteration of sensations as late effects of stroke (HCC) 01/26/2013   Hyperlipidemia    Hypertension    Hypothyroidism    Lump in female breast    Panic disorder    PONV (postoperative nausea and vomiting)    Stroke (HCC) 2009   Tremor, essential 04/24/2020   Type II diabetes mellitus (HCC)    Past Surgical History:  Procedure Laterality Date   ANAL FISSURE REPAIR  2008   APPENDECTOMY  1986   BREAST BIOPSY Left 2006   neg   BREAST LUMPECTOMY Right 1990's   CESAREAN SECTION  1981; 1983   CHOLECYSTECTOMY  1986   SMALL INTESTINE SURGERY  05/1991   TUBAL LIGATION  1983   Patient Active Problem List   Diagnosis Date Noted   Bone spur 06/13/2021   Acute CVA (cerebrovascular accident) (HCC) 03/07/2021   CVA (cerebral vascular accident) (HCC) 11/01/2020   Chronic pain 04/25/2020   Chronic constipation 04/25/2020   Gastro-esophageal reflux disease without  esophagitis 04/25/2020   Hardening of the aorta (main artery of the heart) (HCC) 04/25/2020   History of iron deficiency anemia 04/25/2020   Hypercholesteremia 04/25/2020   Hypertension 04/25/2020   Personal history of transient ischemic attack (TIA), and cerebral infarction without residual deficits 04/25/2020   Pulmonary emphysema (HCC) 04/25/2020   Vitamin D deficiency 04/25/2020   Tremor, essential 04/24/2020   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   Right lower quadrant abdominal pain 08/18/2018   Spastic hemiplegia affecting nondominant side (HCC) 02/03/2014   Diabetes mellitus (HCC) 09/07/2013   Hypothyroidism 09/07/2013   Type 2 diabetes mellitus without complications (HCC) 09/07/2013   Hemiparesis and alteration of sensations as late effects of stroke (HCC) 01/26/2013   Unsteady gait 01/26/2013   Dizziness and giddiness 01/26/2013   ONSET DATE: 04/2007  REFERRING DIAG: I63.9 (ICD-10-CM) - CVA (cerebral vascular accident) (HCC)   THERAPY DIAG:  Muscle weakness (generalized)  Other lack of coordination  Rationale for Evaluation and Treatment: Rehabilitation  SUBJECTIVE:  SUBJECTIVE STATEMENT: Pt reported that her arm feels more tight today and had been more loose last OT session.  Pt accompanied by: self  PERTINENT HISTORY: Hx of CVA in 2009 resulting in L sided hemiplegia and hemianopsia.  Pt reports hx of tremors in the R hand and  has attempted use of heavy utensils and weight on the wrist which did not feel was helpful.  No current medication for the tremor.  Pt reports she received a recent cortisone shot in the R hand for pain at the base of the LF and RF.  Pt had a more recent CVA in 2023 which further limited vision and weakness.  PRECAUTIONS: Fall  WEIGHT BEARING RESTRICTIONS: No  PAIN:  Are you having pain? No reports of pain this date  FALLS: Has patient fallen in last 6 months? No  LIVING ENVIRONMENT: Lives with: lives with their spouse Lives  in: 1 level home Stairs:  ramp Has following equipment at home: Single point cane, Walker - 4 wheeled, Shower bench, Grab bars, Ramped entry, and 3 wheeled walker, handicapped height toilet and grab bars by the toilet   PLOF: Independent prior to original CVA in 2009.   PATIENT GOALS: Increase LUE strength (pt feels she lost some strength she had gained after rehab from original stroke, as she had been able to use the LUE as a better stabilizer; see goals).  OBJECTIVE:  HAND DOMINANCE: Right  ADLs: Overall ADLs: Spouse assists  Transfers/ambulation related to ADLs: modified indep from wc  Eating: able to cut food with rocker knife Grooming: assist to apply deodorant under L arm d/t limited active L shoulder abd UB Dressing: occasional assist to don a shirt but pt can clasp a bra in the front and don overhead like a sports bra.  Pt reports she may start trying a sports bra as her L bra strap tends to fall.   LB Dressing: assist to manage clothing fasteners on a pair of pants  Toileting: modified indep Bathing: distant supv within her handicapped accessible shower Tub Shower transfers: modified indep Equipment:  see above  IADLs: Shopping: Spouse manages Light housekeeping: pt helps to load/unload dishwasher  Meal Prep: supv-modified indep with light cold meal prep Community mobility: wc; unable to drive Medication management: indep Landscape architect: spouse manages Handwriting:  NT; pt is R hand dominant  MOBILITY STATUS:  uses manual wc; limited ambulation with AD  POSTURE COMMENTS:  L sided hemiplegia  ACTIVITY TOLERANCE: Activity tolerance: to be further assessed  FUNCTIONAL OUTCOME MEASURES: FOTO: 29; predicted 36  UPPER EXTREMITY ROM:  Time constraints limited additional measurements; will complete next session  Active ROM Right Eval WNL Left eval  Shoulder flexion   20  Shoulder abduction  66  Shoulder adduction    Shoulder extension    Shoulder internal  rotation    Shoulder external rotation    Elbow flexion    Elbow extension  -55  Wrist flexion    Wrist extension  -6  Wrist ulnar deviation    Wrist radial deviation    Wrist pronation    Wrist supination  70  (Blank rows = not tested)  UPPER EXTREMITY MMT: To be tested next session  MMT Right eval Left eval  Shoulder flexion  2-/5  Shoulder abduction  2/5  Shoulder adduction    Shoulder extension    Shoulder internal rotation    Shoulder external rotation    Middle trapezius    Lower trapezius    Elbow flexion    Elbow extension  2/5  Wrist flexion    Wrist extension  2-/5  Wrist ulnar deviation    Wrist radial deviation    Wrist pronation    Wrist supination  3-/5  (Blank rows = not tested)  HAND FUNCTION: Grip  strength: Right: 18 lbs; Left: 4 lbs, Lateral pinch: Right: 13 lbs, Left: 6 lbs, and 3 point pinch: Right: 11 lbs, Left: unable lbs  COORDINATION:  9 Hole Peg test: Right: NT sec; Left: NT/unable sec  SENSATION: Light touch: WFL  EDEMA: No visible edema  MUSCLE TONE: LUE: Severe spasticity  COGNITION: Overall cognitive status: Within functional limits for tasks assessed  VISION: Baseline vision: Wears glasses all the time  VISION ASSESSMENT: L hemianopsia following CVA in 2009  PERCEPTION: Not tested  PRAXIS: Impaired: Motor planning  OBSERVATIONS:  Pt pleasant and cooperative.  Verbalizes desire to regain some strength in the LUE, but appropriate expectation for goal to use L hand/arm as a better stabilizer for ADL supplies.  TODAY'S TREATMENT:                                                                                                                              DATE: 11/03/22 Moist heat applied to L shoulder intermittently throughout session and simultaneous to exercises noted below in order to promote muscle relaxation and normalizing tone in the LUE.  Therapeutic Exercise: Pt. tolerated PROM, slow gentle prolonged stretching in  joint ranges of the LUE for shoulder flexion, abduction, elbow flexion, and extension, forearm supination, wrist flexion, extension, and digit MP, PIP, and DIP flexion, and extension.  Performed table slides with R hand over L to promote L shoulder flex, elbow ext, and L shoulder horiz abd, add.  Pt worked on L grip squeezes into a rolled towel for 3 sets 10 reps, working to increase strength to stabilize ADL supplies in the L hand.  Alternated passive wrist and digit stretching between every 1-2 gripping reps to normalize tone.  Reviewed self ROM on the R hand to promote widening at the A1 pulley d/t pt reporting triggering in the R 3rd finger PIP joint.  Pt returned demo of MP and PIP digit extension using edge of table top, and active digit abd.  Neuro re-ed: Participated in WB positions to promote proprioceptive input for normalizing tone throughout the LUE.  In standing, pt was assisted to lean into bilat elbows and forearms on table top to achieve L hand flat on table top, requiring max A to extend L hand digits into this position.  Made several slow attempts at a slow transition from weight bearing on forearms to more erect standing maintaining only L hand flat on table top, but not yet able to maintain flat hand in this position d/t high tone today.  PATIENT EDUCATION: Education details: HEP progression, WB positions into LUE for normalizing tone Person educated: Patient Education method: Explanation Education comprehension: verbalized understanding  HOME EXERCISE PROGRAM: R hand self ROM on the R hand to promote widening at the A1 pulley, L hand towel squeezes, LUE table slides R hand over L   GOALS: Goals reviewed with patient? Yes  SHORT TERM GOALS: Target date: 12/01/22 (6 weeks)  Pt will be indep  to perform HEP for improving LUE strength for stabilizing ADL supplies. Baseline: Eval: HEP not yet initiated Goal status: INITIAL  LONG TERM GOALS: Target date: 01/12/23 (12 weeks)  Pt  will increase FOTO score to 36 or better to indicate improvement in self perceived functional use of the L arm with daily tasks. Baseline: Eval: 29 Goal status: INITIAL  2.  Pt will increase L grip strength by 5 or more to be able to stabilize a food can in the L hand while the R hand manipulates a can opener. Baseline: Eval: L grip 4 lbs (pt reports previously able to manage this task, but unable over the last several years) Goal status: INITIAL  3.  Pt will be indep with LUE positioning strategies to promote good skin integrity beneath L axilla/arm/L side of trunk Baseline: Eval: Pt reports excessive sweating beneath L axilla and difficulty maintaining LUE into slight abd; initiated educ on wc arm trough or pillow placement while seated in wc Goal status: INITIAL  4.  Pt will increase LUE strength grossly 1/2 muscle grade in order to stabilize ADL supplies against trunk and improve ability to reposition LUE during self care tasks. Baseline: Eval: Left shoulder flexion: 2-/5, abduction: 2/5, elbow extension: 2/5, wrist extension: 3-/5 Goal status: INITIAL  5.  Pt will don/doff a sports bra with modified indep. Baseline: Eval: pt reports L bra strap routinely falls and she may try a sports bra Goal status: INITIAL  ASSESSMENT: CLINICAL IMPRESSION: Pt. continues to present with increased flexor tone, and tightness in the LUE and hand which limits overall ROM, and engagement during functional ADL, and IADL tasks.  Pt reports that her LUE felt more tight today, but did respond well to passive stretching and WB through the L forearm to normalize tone.  Pt reports that she's been placing a small pillow beneath her L axilla and she can tell this has helped to improve tolerance for L shoulder abd and promoting healthy skin integrity to L torso and axillary region.  Pt reports that she has been tolerating the oval 8 splint well for the R hand 3rd digit to reduce triggering, and shows good understanding  of stretching to promote widening at the A1 pulley of this finger.  Pt. responded well to treatment session and moist heat, however continues to present with severe spasticity throughout the LUE. Pt continues to benefit from skilled OT to provide therapeutic exercises, neuromuscular re-education, therapeutic activities, and ADL training in order to maximize indep with daily tasks.     PERFORMANCE DEFICITS: in functional skills including ADLs, IADLs, coordination, dexterity, tone, ROM, strength, flexibility, Fine motor control, Gross motor control, hearing, mobility, balance, body mechanics, endurance, decreased knowledge of use of DME, skin integrity, vision, and UE functional use, and psychosocial skills including coping strategies, environmental adaptation, habits, interpersonal interactions, and routines and behaviors.   IMPAIRMENTS: are limiting patient from ADLs and IADLs.   CO-MORBIDITIES: has co-morbidities such as hemianopsia, emphysema, tremor, spastic hemiplegia  that affects occupational performance. Patient will benefit from skilled OT to address above impairments and improve overall function.  MODIFICATION OR ASSISTANCE TO COMPLETE EVALUATION: Min-Moderate modification of tasks or assist with assess necessary to complete an evaluation.  OT OCCUPATIONAL PROFILE AND HISTORY: Problem focused assessment: Including review of records relating to presenting problem.  CLINICAL DECISION MAKING: Moderate - several treatment options, min-mod task modification necessary  REHAB POTENTIAL: Good for goals  EVALUATION COMPLEXITY: Moderate    PLAN:  OT FREQUENCY: 2x/week  OT  DURATION: 12 weeks  PLANNED INTERVENTIONS: self care/ADL training, therapeutic exercise, therapeutic activity, neuromuscular re-education, manual therapy, passive range of motion, splinting, moist heat, cryotherapy, patient/family education, visual/perceptual remediation/compensation, psychosocial skills training, energy  conservation, coping strategies training, and DME and/or AE instructions  RECOMMENDED OTHER SERVICES: None at this time  CONSULTED AND AGREED WITH PLAN OF CARE: Patient  PLAN FOR NEXT SESSION: complete ROM and MMT measures (limited by time constraints at eval)  Danelle Earthly, MS, OTR/L  11/06/2022, 9:15 AM

## 2022-11-06 NOTE — Therapy (Signed)
OUTPATIENT PHYSICAL THERAPY NEURO TREATMENT/ Physical Therapy Progress Note/RECERT   Dates of reporting period  09/29/22   to   11/06/22      Patient Name: [name] MRN: [id number] DOB:[birth year], 72 y.o., female Today's Date: 11/07/2022   PCP:  Enid Baas, MD   REFERRING PROVIDER: Morene Crocker, MD   END OF SESSION:  PT End of Session - 11/06/22 1537     Visit Number 40    Number of Visits 64    Date for PT Re-Evaluation 01/29/23    Authorization Type [employer]    Progress Note Due on Visit 40    PT Start Time 1534    PT Stop Time 1614    PT Time Calculation (min) 40 min    Equipment Utilized During Treatment Gait belt    Activity Tolerance Patient tolerated treatment well;No increased pain    Behavior During Therapy [facility] for tasks assessed/performed                              Past Medical History:  Diagnosis Date   Abdominal pain 12/06/2013   Abnormality of gait 01/26/2013   Altered bowel habits 12/11/2017   Bruises easily    Constipation    Crohn's disease (HCC)    Depression    Dizziness and giddiness 01/26/2013   Fecal incontinence alternating with constipation 12/11/2017   GERD (gastroesophageal reflux disease)    Hemianopia    left   Hemiparesis and alteration of sensations as late effects of stroke (HCC) 01/26/2013   Hyperlipidemia    Hypertension    Hypothyroidism    Lump in female breast    Panic disorder    PONV (postoperative nausea and vomiting)    Stroke (HCC) 2009   Tremor, essential 04/24/2020   Type II diabetes mellitus (HCC)    Past Surgical History:  Procedure Laterality Date   ANAL FISSURE REPAIR  2008   APPENDECTOMY  1986   BREAST BIOPSY Left 2006   neg   BREAST LUMPECTOMY Right 1990's   CESAREAN SECTION  1981; 1983   CHOLECYSTECTOMY  1986   SMALL INTESTINE SURGERY  05/1991   TUBAL LIGATION  1983   Patient Active Problem List   Diagnosis Date Noted   Bone spur 06/13/2021    Acute CVA (cerebrovascular accident) (HCC) 03/07/2021   CVA (cerebral vascular accident) (HCC) 11/01/2020   Chronic pain 04/25/2020   Chronic constipation 04/25/2020   Gastro-esophageal reflux disease without esophagitis 04/25/2020   Hardening of the aorta (main artery of the heart) (HCC) 04/25/2020   History of iron deficiency anemia 04/25/2020   Hypercholesteremia 04/25/2020   Hypertension 04/25/2020   Personal history of transient ischemic attack (TIA), and cerebral infarction without residual deficits 04/25/2020   Pulmonary emphysema (HCC) 04/25/2020   Vitamin D deficiency 04/25/2020   Tremor, essential 04/24/2020   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   Right lower quadrant abdominal pain 08/18/2018   Spastic hemiplegia affecting nondominant side (HCC) 02/03/2014   Diabetes mellitus (HCC) 09/07/2013   Hypothyroidism 09/07/2013   Type 2 diabetes mellitus without complications (HCC) 09/07/2013   Hemiparesis and alteration of sensations as late effects of stroke (HCC) 01/26/2013   Unsteady gait 01/26/2013   Dizziness and giddiness 01/26/2013    ONSET DATE: 2009  REFERRING DIAG: Z86.73 (ICD-10-CM) - History of stroke   THERAPY DIAG:  Difficulty in walking, not elsewhere classified - Plan: PT plan of care cert/re-cert  Other abnormalities of gait and mobility - Plan: PT plan of care cert/re-cert  Unsteadiness on feet - Plan: PT plan of care cert/re-cert  Abnormality of gait and mobility - Plan: PT plan of care cert/re-cert  Muscle weakness (generalized) - Plan: PT plan of care cert/re-cert  Rationale for Evaluation and Treatment: Rehabilitation  SUBJECTIVE:                                                                                                                                                                                             SUBJECTIVE STATEMENT:   Pt reports doing well today. Pt reports she would like to get better at stair and curb navigation. Pt  brings her 3WW and her forearm cane to session for practice.   Pt accompanied by: self  PERTINENT HISTORY: Pt has had one major stroke and 5 mini strokes since 2009. Pt is here since her doctor wants her to work on her strength and balance and she also wants to improve in these categories. Pt has hemianopsia with blindness inferior, left side and superior. Pt initially had stroke in 2009 and was previously able to walk for long distances. Pt has 650 feet driveway that is gravel and used to walk up and down with the cane. Pt has issues with depth perception. Pt is unable to tell angles of concrete and holes in the ground. Pt has service dogs that are retired, one for guidance and one for helping with picking up and retrieving items. Pt had most recent stroke in January 2023 that further limited her field of vision.  Patient wants to work diligently on her strength, balance, mobility, and in order to improve her ability to ambulate on various surfaces in various scenarios.  Patient wanted to come to outpatient services to utilize equipment to improve her strength and a greater capacity than home health was able to assist her with.  She is doing home exercise program involving mostly standing with upper extremity support and activities to improve her strength.  Patient reports she is very nervous to ambulate without contact-guard assist and gait belt without being able to hold onto a wall or steady surface in order to provide her with tactile cues for the areas around her.  PAIN:  Are you having pain? None  PRECAUTIONS: Fall, hemianopsia  WEIGHT BEARING RESTRICTIONS: No  FALLS: Has patient fallen in last 6 months? No  PATIENT GOALS: Improve gait, improve strength, walk on unsteady surfaces  OBJECTIVE:   TODAY'S TREATMENT:  DATE: 11/07/22 Unless otherwise stated, CGA was  provided and gait belt donned in order to ensure pt safety  Patient's condition has the potential to improve in response to therapy. Maximum improvement is yet to be obtained. The anticipated improvement is attainable and reasonable in a generally predictable time.      PATIENT EDUCATION: Education details:Fear of falling and how this is a natural response and feeling toward higher level interventions.  Person educated: Patient Education method: Explanation Education comprehension: verbalized understanding  HOME EXERCISE PROGRAM:   Access Code: [email or web address] URL: [email or web address] Date: 07/31/2022 Prepared by: Grier Rocher  Exercises - Standing Tandem Balance with Counter Support  - 1 x daily - 3 x weekly - 2 sets - 2 reps - 10 hold - Standing Single Leg Stance with Counter Support  - 1 x daily - 3 x weekly - 2 sets - 2 reps - 5 hold  Updated 05/29/2022: Access Code: [email or web address] URL: [email or web address] Date: 05/29/2022 Prepared by: Temple Pacini  Exercises - Seated March  - 1 x daily - 5 x weekly - 3 sets - 10 reps - Seated Long Arc Quad  - 1 x daily - 7 x weekly - 3 sets - 10 reps -written on handout: seated core twists with holding YTB or RTB 5x weekly 3x10 each side  Previous: Pt performs leg lifts and squats with UE support    GOALS: Goals reviewed with patient? Yes  SHORT TERM GOALS: Target date: 06/24/2022    Patient will be independent in home exercise program to improve strength/mobility for better functional independence with ADLs. Baseline: No HEP currently 9/12: pt completing HEP regularly.  Goal status: MET  LONG TERM GOALS: Target date: 01/29/2023    1.  Patient (> 34 years old) will complete five times sit to stand test in < 15 seconds indicating an increased LE strength and improved balance. Baseline:  Test second visit ; 05/29/2022: 40 sec pull to stance RUE 07/10/22: 22.82 sec. 08/18/22: 16.36 sec with UE support 09/29/22:19.32  sec, 42 sec without UE assist  9/9:19.86 sec no UE assist  Goal status: ONGOING/ PROGRESSING  2.  Patient will increase FOTO score by 8 or more points  to demonstrate statistically significant improvement in mobility and quality of life.  Baseline: Test second visit; 05/28/2040: 45; 07/10/22: 41, 08/18/22: 45 8/5:48 Goal status: ONGOING  3.  Patient will increase 10 meter walk test to >.19m/s as to improve gait speed for better community ambulation and to reduce fall risk. Baseline: Test visit 2 06/19/22: 53 sec 5/16: 77 sec. . 8/5:46 sec with SPC, 33 sec with RW 9/12: 37.66 sec, 24 sec with 3WW Goal status: ONGOING   4. Patient will improve ability to ambulate on uneven surfaces as evidenced by ability to ambulate over simulated uneven terrain in clinic to improve independence with ambulation. 9/12: pt ambulates over red mat and blue mat without LOB, still not confident on red mat  Goal status: MET  5.  Pt will ascend and descend 4 stairs with unilateral UE assist and with step through pattern of gait and without any sign of imbalance to improve her community mobility.  Baseline: Pt has R lateral lean with ascending and descending and has LLE internal rotation of foot when stepping down.  Goal status: INITIAL  6.  Pt will step on and off of curb or simulated curb using SPC in order to allow for improved community mobility.  Baseline: Unable, attempted 9/12 Goal status: INITIAL  ASSESSMENT:  CLINICAL IMPRESSION:   Patient presents to physical therapy for progress note this date.  Patient shows progress towards her long-term goals with being able to ambulate with increased speed with both her forearm cane and her 3 wheeled walker.  Patient also shows improvement in both confidence and efficacy in her ability to ambulate on various surfaces and shows ability to ambulate over blue mat as well as very compliant surface of red mat.  Although patient is not confident with red mat or on  other surfaces like that she is able to complete it safely this date.  Patient would like to continue to progress her functional mobility by improving her ability to navigate stairs with improved efficiency and safety as well as improve her ability to navigate curbs utilizing her forearm cane.  Both of these goals were added to patient's plan of care in order to reflect this. Patient's condition has the potential to improve in response to therapy. Maximum improvement is yet to be obtained. The anticipated improvement is attainable and reasonable in a generally predictable time.  Pt will continue to benefit from skilled physical therapy intervention to address impairments, improve QOL, and attain therapy goals.       OBJECTIVE IMPAIRMENTS: Abnormal gait, decreased activity tolerance, decreased balance, decreased endurance, decreased strength, decreased safety awareness, hypomobility, impaired flexibility, and impaired UE functional use.   ACTIVITY LIMITATIONS: lifting, bending, standing, stairs, transfers, bed mobility, and locomotion level  PARTICIPATION LIMITATIONS: meal prep, cleaning, shopping, and community activity  PERSONAL FACTORS: Time since onset of injury/illness/exacerbation and 3+ comorbidities: diabetes, HTN, Multiple previous strokes   are also affecting patient's functional outcome.   REHAB POTENTIAL: Fair secondary to time since onset and severely of visual deficits and L UE funcitonal use   CLINICAL DECISION MAKING: Stable/uncomplicated  EVALUATION COMPLEXITY: Low  PLAN:  PT FREQUENCY: 2x/week  PT DURATION: 12 weeks  PLANNED INTERVENTIONS: Therapeutic exercises, Therapeutic activity, Neuromuscular re-education, Balance training, Gait training, Patient/Family education, Self Care, Joint mobilization, Stair training, Moist heat, Manual therapy, and Re-evaluation  PLAN FOR NEXT SESSION:   Eyes closed balance to harness vestibular and proprioceptive senses. Progression to  ambulation on red mat.  Ambulation and other functional movement tasks while speaking ( not stopping ambulating when talking)  Working on curb navigation stepping on and off of curbs as well as stair navigation both ascending and descending.   Norman Herrlich PT ,DPT Physical Therapist- [facility]   8:07 AM 11/07/22

## 2022-11-10 ENCOUNTER — Other Ambulatory Visit: Payer: Self-pay

## 2022-11-10 ENCOUNTER — Ambulatory Visit: Payer: PPO | Admitting: Physical Therapy

## 2022-11-10 ENCOUNTER — Ambulatory Visit: Payer: PPO

## 2022-11-10 DIAGNOSIS — R262 Difficulty in walking, not elsewhere classified: Secondary | ICD-10-CM

## 2022-11-10 DIAGNOSIS — M6281 Muscle weakness (generalized): Secondary | ICD-10-CM | POA: Diagnosis not present

## 2022-11-10 DIAGNOSIS — R2681 Unsteadiness on feet: Secondary | ICD-10-CM

## 2022-11-10 DIAGNOSIS — R269 Unspecified abnormalities of gait and mobility: Secondary | ICD-10-CM

## 2022-11-10 DIAGNOSIS — R2689 Other abnormalities of gait and mobility: Secondary | ICD-10-CM

## 2022-11-10 DIAGNOSIS — R278 Other lack of coordination: Secondary | ICD-10-CM

## 2022-11-10 MED ORDER — METFORMIN HCL 500 MG PO TABS
500.0000 mg | ORAL_TABLET | Freq: Two times a day (BID) | ORAL | 3 refills | Status: DC
  Filled 2022-11-10: qty 180, 90d supply, fill #0
  Filled 2023-02-14: qty 180, 90d supply, fill #1
  Filled 2023-05-09: qty 180, 90d supply, fill #2
  Filled 2023-08-16: qty 180, 90d supply, fill #3

## 2022-11-10 MED ORDER — PANTOPRAZOLE SODIUM 40 MG PO TBEC
40.0000 mg | DELAYED_RELEASE_TABLET | Freq: Every morning | ORAL | 1 refills | Status: DC
  Filled 2022-11-10: qty 90, 90d supply, fill #0
  Filled 2023-02-07: qty 90, 90d supply, fill #1

## 2022-11-10 NOTE — Therapy (Signed)
OUTPATIENT PHYSICAL THERAPY NEURO TREATMENT      Patient Name: [name] MRN: [id number] DOB:09-Mar-1950, 72 y.o., female Today's Date: 11/10/2022   PCP:  Enid Baas, MD   REFERRING PROVIDER: Morene Crocker, MD   END OF SESSION:  PT End of Session - 11/10/22 1351     Visit Number 41    Number of Visits 64    Date for PT Re-Evaluation 01/29/23    Authorization Type [employer]    Progress Note Due on Visit 40    PT Start Time 1445    PT Stop Time 1528    PT Time Calculation (min) 43 min    Equipment Utilized During Treatment Gait belt    Activity Tolerance Patient tolerated treatment well;No increased pain    Behavior During Therapy [facility] for tasks assessed/performed                               Past Medical History:  Diagnosis Date   Abdominal pain 12/06/2013   Abnormality of gait 01/26/2013   Altered bowel habits 12/11/2017   Bruises easily    Constipation    Crohn's disease (HCC)    Depression    Dizziness and giddiness 01/26/2013   Fecal incontinence alternating with constipation 12/11/2017   GERD (gastroesophageal reflux disease)    Hemianopia    left   Hemiparesis and alteration of sensations as late effects of stroke (HCC) 01/26/2013   Hyperlipidemia    Hypertension    Hypothyroidism    Lump in female breast    Panic disorder    PONV (postoperative nausea and vomiting)    Stroke (HCC) 2009   Tremor, essential 04/24/2020   Type II diabetes mellitus (HCC)    Past Surgical History:  Procedure Laterality Date   ANAL FISSURE REPAIR  2008   APPENDECTOMY  1986   BREAST BIOPSY Left 2006   neg   BREAST LUMPECTOMY Right 1990's   CESAREAN SECTION  1981; 1983   CHOLECYSTECTOMY  1986   SMALL INTESTINE SURGERY  05/1991   TUBAL LIGATION  1983   Patient Active Problem List   Diagnosis Date Noted   Bone spur 06/13/2021   Acute CVA (cerebrovascular accident) (HCC) 03/07/2021   CVA (cerebral vascular accident) (HCC)  11/01/2020   Chronic pain 04/25/2020   Chronic constipation 04/25/2020   Gastro-esophageal reflux disease without esophagitis 04/25/2020   Hardening of the aorta (main artery of the heart) (HCC) 04/25/2020   History of iron deficiency anemia 04/25/2020   Hypercholesteremia 04/25/2020   Hypertension 04/25/2020   Personal history of transient ischemic attack (TIA), and cerebral infarction without residual deficits 04/25/2020   Pulmonary emphysema (HCC) 04/25/2020   Vitamin D deficiency 04/25/2020   Tremor, essential 04/24/2020   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  Rationale for Evaluation and Treatment: Rehabilitation  SUBJECTIVE:  SUBJECTIVE STATEMENT:   Pt reports doing well today. No significant changes since last session.  Pt accompanied by: self  PERTINENT HISTORY: Pt has had one major stroke and 5 mini strokes since 2009. Pt is here since her doctor wants her to work on her strength and balance and she also wants to improve in these categories. Pt has hemianopsia with blindness inferior, left side and superior. Pt initially had stroke in 2009 and was previously able to walk for long distances. Pt has 650 feet driveway that is gravel and used to walk up and down with the cane. Pt has  issues with depth perception. Pt is unable to tell angles of concrete and holes in the ground. Pt has service dogs that are retired, one for guidance and one for helping with picking up and retrieving items. Pt had most recent stroke in January 2023 that further limited her field of vision.  Patient wants to work diligently on her strength, balance, mobility, and in order to improve her ability to ambulate on various surfaces in various scenarios.  Patient wanted to come to outpatient services to utilize equipment to improve her strength and a greater capacity than home health was able to assist her with.  She is doing home exercise program involving mostly standing with upper extremity support and activities to improve her strength.  Patient reports she is very nervous to ambulate without contact-guard assist and gait belt without being able to hold onto a wall or steady surface in order to provide her with tactile cues for the areas around her.  PAIN:  Are you having pain? None  PRECAUTIONS: Fall, hemianopsia  WEIGHT BEARING RESTRICTIONS: No  FALLS: Has patient fallen in last 6 months? No  PATIENT GOALS: Improve gait, improve strength, walk on unsteady surfaces  OBJECTIVE:   TODAY'S TREATMENT:                                                                                                                              DATE: 11/10/22 Unless otherwise stated, CGA was provided and gait belt donned in order to ensure pt safety TA Stair practice  -step taps 2 x 10 to standard step using cane for balance  2 x 10 to step trainer using cane for balance ( L) STS x 10  Ambulation x 180 ft in clinic with turns and with constant conversation with focus on maintaining gait with conversation as pt tends to stop ambulation every tie she speaks. Pt able to use partial step through gait pattern at times.     PATIENT EDUCATION: Education details:Fear of falling and how this is a natural response and  feeling toward higher level interventions.  Person educated: Patient Education method: Explanation Education comprehension: verbalized understanding  HOME EXERCISE PROGRAM:   Access Code: [email or web address] URL: [email or web address] Date: 07/31/2022 Prepared by: Grier Rocher  Exercises - Standing Tandem Balance with Counter Support  - 1 x daily - 3 x weekly -  2 sets - 2 reps - 10 hold - Standing Single Leg Stance with Counter Support  - 1 x daily - 3 x weekly - 2 sets - 2 reps - 5 hold  Updated 05/29/2022: Access Code: [email or web address] URL: [email or web address] Date: 05/29/2022 Prepared by: Temple Pacini  Exercises - Seated March  - 1 x daily - 5 x weekly - 3 sets - 10 reps - Seated Long Arc Quad  - 1 x daily - 7 x weekly - 3 sets - 10 reps -written on handout: seated core twists with holding YTB or RTB 5x weekly 3x10 each side  Previous: Pt performs leg lifts and squats with UE support    GOALS: Goals reviewed with patient? Yes  SHORT TERM GOALS: Target date: 06/24/2022    Patient will be independent in home exercise program to improve strength/mobility for better functional independence with ADLs. Baseline: No HEP currently 9/12: pt completing HEP regularly.  Goal status: MET  LONG TERM GOALS: Target date: 01/29/2023    1.  Patient (> 70 years old) will complete five times sit to stand test in < 15 seconds indicating an increased LE strength and improved balance. Baseline:  Test second visit ; 05/29/2022: 40 sec pull to stance RUE 07/10/22: 22.82 sec. 08/18/22: 16.36 sec with UE support 09/29/22:19.32 sec, 42 sec without UE assist  9/9:19.86 sec no UE assist  Goal status: ONGOING/ PROGRESSING  2.  Patient will increase FOTO score by 8 or more points  to demonstrate statistically significant improvement in mobility and quality of life.  Baseline: Test second visit; 05/28/2040: 45; 07/10/22: 41, 08/18/22: 45 8/5:48 Goal status: ONGOING  3.  Patient will increase 10  meter walk test to >.87m/s as to improve gait speed for better community ambulation and to reduce fall risk. Baseline: Test visit 2 06/19/22: 53 sec 5/16: 77 sec. . 8/5:46 sec with SPC, 33 sec with RW 9/12: 37.66 sec, 24 sec with 3WW Goal status: ONGOING   4. Patient will improve ability to ambulate on uneven surfaces as evidenced by ability to ambulate over simulated uneven terrain in clinic to improve independence with ambulation. 9/12: pt ambulates over red mat and blue mat without LOB, still not confident on red mat  Goal status: MET  5.  Pt will ascend and descend 4 stairs with unilateral UE assist and with step through pattern of gait and without any sign of imbalance to improve her community mobility.  Baseline: Pt has R lateral lean with ascending and descending and has LLE internal rotation of foot when stepping down.  Goal status: INITIAL  6.  Pt will step on and off of curb or simulated curb using SPC in order to allow for improved community mobility.  Baseline: Unable, attempted 9/12 Goal status: INITIAL       ASSESSMENT:  CLINICAL IMPRESSION:   Pt presents with good motivation for completion of PT activities. Focussed on functional movement breakdown of stair and curb navigation focusing on initial hip flexion required for movement. Pt also focussed on ambulation while talking or otherwise distracted where pt usually stops ambulating in these situations. Pt will continue to benefit from skilled physical therapy intervention to address impairments, improve QOL, and attain therapy goals.      OBJECTIVE IMPAIRMENTS: Abnormal gait, decreased activity tolerance, decreased balance, decreased endurance, decreased strength, decreased safety awareness, hypomobility, impaired flexibility, and impaired UE functional use.   ACTIVITY LIMITATIONS: lifting, bending, standing, stairs, transfers, bed mobility, and locomotion level  PARTICIPATION LIMITATIONS: meal  prep, cleaning,  shopping, and community activity  PERSONAL FACTORS: Time since onset of injury/illness/exacerbation and 3+ comorbidities: diabetes, HTN, Multiple previous strokes   are also affecting patient's functional outcome.   REHAB POTENTIAL: Fair secondary to time since onset and severely of visual deficits and L UE funcitonal use   CLINICAL DECISION MAKING: Stable/uncomplicated  EVALUATION COMPLEXITY: Low  PLAN:  PT FREQUENCY: 2x/week  PT DURATION: 12 weeks  PLANNED INTERVENTIONS: Therapeutic exercises, Therapeutic activity, Neuromuscular re-education, Balance training, Gait training, Patient/Family education, Self Care, Joint mobilization, Stair training, Moist heat, Manual therapy, and Re-evaluation  PLAN FOR NEXT SESSION:   Eyes closed balance to harness vestibular and proprioceptive senses. Progression to ambulation on red mat.  Ambulation and other functional movement tasks while speaking ( not stopping ambulating when talking)  Working on curb navigation stepping on and off of curbs as well as stair navigation both ascending and descending. Ambulation with cane or 3WW on uneven surfaces ( inclines, declines, etc)    Norman Herrlich PT ,DPT Physical Therapist- [place]  [facility]   3:18 PM 11/10/22

## 2022-11-10 NOTE — Therapy (Signed)
OUTPATIENT OCCUPATIONAL THERAPY NEURO TREATMENT NOTE  Patient Name: [name] MRN: [id number] DOB:1950/10/03, 72 y.o., female Today's Date: 11/10/2022  PCP: Dr. Enid Baas REFERRING PROVIDER: Dr. Theora Master  END OF SESSION:  OT End of Session - 11/10/22 2009     Visit Number 5    Number of Visits 24    Date for OT Re-Evaluation 01/12/23    Progress Note Due on Visit 10    OT Start Time 1400    OT Stop Time 1445    OT Time Calculation (min) 45 min    Equipment Utilized During Treatment wc    Activity Tolerance Patient tolerated treatment well    Behavior During Therapy [facility] for tasks assessed/performed            Past Medical History:  Diagnosis Date   Abdominal pain 12/06/2013   Abnormality of gait 01/26/2013   Altered bowel habits 12/11/2017   Bruises easily    Constipation    Crohn's disease (HCC)    Depression    Dizziness and giddiness 01/26/2013   Fecal incontinence alternating with constipation 12/11/2017   GERD (gastroesophageal reflux disease)    Hemianopia    left   Hemiparesis and alteration of sensations as late effects of stroke (HCC) 01/26/2013   Hyperlipidemia    Hypertension    Hypothyroidism    Lump in female breast    Panic disorder    PONV (postoperative nausea and vomiting)    Stroke (HCC) 2009   Tremor, essential 04/24/2020   Type II diabetes mellitus (HCC)    Past Surgical History:  Procedure Laterality Date   ANAL FISSURE REPAIR  2008   APPENDECTOMY  1986   BREAST BIOPSY Left 2006   neg   BREAST LUMPECTOMY Right 1990's   CESAREAN SECTION  1981; 1983   CHOLECYSTECTOMY  1986   SMALL INTESTINE SURGERY  05/1991   TUBAL LIGATION  1983   Patient Active Problem List   Diagnosis Date Noted   Bone spur 06/13/2021   Acute CVA (cerebrovascular accident) (HCC) 03/07/2021   CVA (cerebral vascular accident) (HCC) 11/01/2020   Chronic pain 04/25/2020   Chronic constipation 04/25/2020   Gastro-esophageal reflux disease without  esophagitis 04/25/2020   Hardening of the aorta (main artery of the heart) (HCC) 04/25/2020   History of iron deficiency anemia 04/25/2020   Hypercholesteremia 04/25/2020   Hypertension 04/25/2020   Personal history of transient ischemic attack (TIA), and cerebral infarction without residual deficits 04/25/2020   Pulmonary emphysema (HCC) 04/25/2020   Vitamin D deficiency 04/25/2020   Tremor, essential 04/24/2020   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   Right lower quadrant abdominal pain 08/18/2018   Spastic hemiplegia affecting nondominant side (HCC) 02/03/2014   Diabetes mellitus (HCC) 09/07/2013   Hypothyroidism 09/07/2013   Type 2 diabetes mellitus without complications (HCC) 09/07/2013   Hemiparesis and alteration of sensations as late effects of stroke (HCC) 01/26/2013   Unsteady gait 01/26/2013   Dizziness and giddiness 01/26/2013   ONSET DATE: 04/2007  REFERRING DIAG: I63.9 (ICD-10-CM) - CVA (cerebral vascular accident) (HCC)   THERAPY DIAG:  Muscle weakness (generalized)  Other lack of coordination  Rationale for Evaluation and Treatment: Rehabilitation  SUBJECTIVE:  SUBJECTIVE STATEMENT: Pt reports that she may not do Botox again d/t the financial aspect, and having to do it over again every 3 months.   Pt accompanied by: self  PERTINENT HISTORY: Hx of CVA in 2009 resulting in L sided hemiplegia and hemianopsia.  Pt reports hx  of tremors in the R hand and has attempted use of heavy utensils and weight on the wrist which did not feel was helpful.  No current medication for the tremor.  Pt reports she received a recent cortisone shot in the R hand for pain at the base of the LF and RF.  Pt had a more recent CVA in 2023 which further limited vision and weakness.  PRECAUTIONS: Fall  WEIGHT BEARING RESTRICTIONS: No  PAIN:  Are you having pain? No reports of pain this date  FALLS: Has patient fallen in last 6 months? No  LIVING ENVIRONMENT: Lives with: lives  with their spouse Lives in: 1 level home Stairs:  ramp Has following equipment at home: Single point cane, Walker - 4 wheeled, Shower bench, Grab bars, Ramped entry, and 3 wheeled walker, handicapped height toilet and grab bars by the toilet   PLOF: Independent prior to original CVA in 2009.   PATIENT GOALS: Increase LUE strength (pt feels she lost some strength she had gained after rehab from original stroke, as she had been able to use the LUE as a better stabilizer; see goals).  OBJECTIVE:  HAND DOMINANCE: Right  ADLs: Overall ADLs: Spouse assists  Transfers/ambulation related to ADLs: modified indep from wc  Eating: able to cut food with rocker knife Grooming: assist to apply deodorant under L arm d/t limited active L shoulder abd UB Dressing: occasional assist to don a shirt but pt can clasp a bra in the front and don overhead like a sports bra.  Pt reports she may start trying a sports bra as her L bra strap tends to fall.   LB Dressing: assist to manage clothing fasteners on a pair of pants  Toileting: modified indep Bathing: distant supv within her handicapped accessible shower Tub Shower transfers: modified indep Equipment:  see above  IADLs: Shopping: Spouse manages Light housekeeping: pt helps to load/unload dishwasher  Meal Prep: supv-modified indep with light cold meal prep Community mobility: wc; unable to drive Medication management: indep Landscape architect: spouse manages Handwriting:  NT; pt is R hand dominant  MOBILITY STATUS:  uses manual wc; limited ambulation with AD  POSTURE COMMENTS:  L sided hemiplegia  ACTIVITY TOLERANCE: Activity tolerance: to be further assessed  FUNCTIONAL OUTCOME MEASURES: FOTO: 29; predicted 36  UPPER EXTREMITY ROM:  Time constraints limited additional measurements; will complete next session  Active ROM Right Eval WNL Left eval  Shoulder flexion   20  Shoulder abduction  66  Shoulder adduction    Shoulder  extension    Shoulder internal rotation    Shoulder external rotation    Elbow flexion    Elbow extension  -55  Wrist flexion    Wrist extension  -6  Wrist ulnar deviation    Wrist radial deviation    Wrist pronation    Wrist supination  70  (Blank rows = not tested)  UPPER EXTREMITY MMT: To be tested next session  MMT Right eval Left eval  Shoulder flexion  2-/5  Shoulder abduction  2/5  Shoulder adduction    Shoulder extension    Shoulder internal rotation    Shoulder external rotation    Middle trapezius    Lower trapezius    Elbow flexion    Elbow extension  2/5  Wrist flexion    Wrist extension  2-/5  Wrist ulnar deviation    Wrist radial deviation    Wrist pronation    Wrist supination  3-/5  (Blank rows =  not tested)  HAND FUNCTION: Grip strength: Right: 18 lbs; Left: 4 lbs, Lateral pinch: Right: 13 lbs, Left: 6 lbs, and 3 point pinch: Right: 11 lbs, Left: unable lbs  COORDINATION:  9 Hole Peg test: Right: NT sec; Left: NT/unable sec  SENSATION: Light touch: WFL  EDEMA: No visible edema  MUSCLE TONE: LUE: Severe spasticity  COGNITION: Overall cognitive status: Within functional limits for tasks assessed  VISION: Baseline vision: Wears glasses all the time  VISION ASSESSMENT: L hemianopsia following CVA in 2009  PERCEPTION: Not tested  PRAXIS: Impaired: Motor planning  OBSERVATIONS:  Pt pleasant and cooperative.  Verbalizes desire to regain some strength in the LUE, but appropriate expectation for goal to use L hand/arm as a better stabilizer for ADL supplies.  TODAY'S TREATMENT:                                                                                                                              DATE: 11/10/22 Moist heat applied to L shoulder intermittently throughout session and simultaneous to exercises noted below in order to promote muscle relaxation and normalizing tone in the LUE.  Therapeutic Exercise: Pt. tolerated PROM, slow  gentle prolonged stretching in joint ranges of the LUE for shoulder flexion, abduction, elbow flexion, and extension, forearm supination, wrist flexion, extension, and digit MP, PIP, and DIP flexion, and extension.    Estim attended: Tx time: 10 min, 30 mA CC, duty cycle 50%, ramp 5 sec, cycle time 10/10; electrodes placed at proximal and distal forearm, dorsal aspect, to promote wrist and digit extension to normalize tone in the hand.  Alternated reps of grip squeezes during off time, and assisted with passive digit extension when cycle was ramping up.  Therapeutic Activity: Reviewed benefits of Estim for managing tone to ease comfort in the arm, to ease ability to open the hand for hygiene, and for maximizing potential strengthening in the hand for use as a stabilizer for ADL tasks.  Reviewed techniques to normalize tone, including Wbing into the L hand while seated, as pt reports that her fingers curl when standing, which causes pain.  Reviewed benefits of heat, passive stretching, and made recommendation for pursuing home Estim unit as a tool to manage spasticity in the arm on a more regular basis.  OT provided options to obtain Estim unit, with specific recommedation for [employer] unit d/t simple self application and operation.  PATIENT EDUCATION: Education details: management of tone in the LUE Person educated: Patient Education method: Explanation Education comprehension: verbalized understanding, further training needed  HOME EXERCISE PROGRAM: R hand self ROM on the R hand to promote widening at the A1 pulley, L hand towel squeezes, LUE table slides R hand over L  GOALS: Goals reviewed with patient? Yes  SHORT TERM GOALS: Target date: 12/01/22 (6 weeks)  Pt will be indep to perform HEP for improving LUE strength for stabilizing ADL supplies. Baseline: Eval: HEP not yet initiated Goal  status: INITIAL  LONG TERM GOALS: Target date: 01/12/23 (12 weeks)  Pt will increase  FOTO score to 36 or better to indicate improvement in self perceived functional use of the L arm with daily tasks. Baseline: Eval: 29 Goal status: INITIAL  2.  Pt will increase L grip strength by 5 or more to be able to stabilize a food can in the L hand while the R hand manipulates a can opener. Baseline: Eval: L grip 4 lbs (pt reports previously able to manage this task, but unable over the last several years) Goal status: INITIAL  3.  Pt will be indep with LUE positioning strategies to promote good skin integrity beneath L axilla/arm/L side of trunk Baseline: Eval: Pt reports excessive sweating beneath L axilla and difficulty maintaining LUE into slight abd; initiated educ on wc arm trough or pillow placement while seated in wc Goal status: INITIAL  4.  Pt will increase LUE strength grossly 1/2 muscle grade in order to stabilize ADL supplies against trunk and improve ability to reposition LUE during self care tasks. Baseline: Eval: Left shoulder flexion: 2-/5, abduction: 2/5, elbow extension: 2/5, wrist extension: 3-/5 Goal status: INITIAL  5.  Pt will don/doff a sports bra with modified indep. Baseline: Eval: pt reports L bra strap routinely falls and she may try a sports bra Goal status: INITIAL  ASSESSMENT: CLINICAL IMPRESSION: Pt continues to present with increased flexor tone and tightness in the LUE and hand which limits overall ROM, and engagement during functional ADL, and IADL tasks.  Pt reported that she may not purse Botox again d/t financial reasons, and having to return for Botox treatments every 3 months.  OT reviewed pt's goal of wanting to strengthen the LUE to better engage the arm as a stabilizer and provided education on the limitations of strengthening when tone is so severe. OT reviewed alternative strategies to manage spasticity this date as noted above, and trialed Estim today to reduce flexor tone in the hand.  Pt plans to look into a home Estim unit as pt was pleased  with how her hand felt more relaxed following today's tx.  OT provided options to obtain.  Pt continues to benefit from skilled OT to provide therapeutic exercises, neuromuscular re-education, therapeutic activities, and ADL training in order to maximize indep with daily tasks.     PERFORMANCE DEFICITS: in functional skills including ADLs, IADLs, coordination, dexterity, tone, ROM, strength, flexibility, Fine motor control, Gross motor control, hearing, mobility, balance, body mechanics, endurance, decreased knowledge of use of DME, skin integrity, vision, and UE functional use, and psychosocial skills including coping strategies, environmental adaptation, habits, interpersonal interactions, and routines and behaviors.   IMPAIRMENTS: are limiting patient from ADLs and IADLs.   CO-MORBIDITIES: has co-morbidities such as hemianopsia, emphysema, tremor, spastic hemiplegia  that affects occupational performance. Patient will benefit from skilled OT to address above impairments and improve overall function.  MODIFICATION OR ASSISTANCE TO COMPLETE EVALUATION: Min-Moderate modification of tasks or assist with assess necessary to complete an evaluation.  OT OCCUPATIONAL PROFILE AND HISTORY: Problem focused assessment: Including review of records relating to presenting problem.  CLINICAL DECISION MAKING: Moderate - several treatment options, min-mod task modification necessary  REHAB POTENTIAL: Good for goals  EVALUATION COMPLEXITY: Moderate    PLAN:  OT FREQUENCY: 2x/week  OT DURATION: 12 weeks  PLANNED INTERVENTIONS: self care/ADL training, therapeutic exercise, therapeutic activity, neuromuscular re-education, manual therapy, passive range of motion, splinting, moist heat, cryotherapy, patient/family education, visual/perceptual remediation/compensation, psychosocial skills training,  energy conservation, coping strategies training, and DME and/or AE instructions  RECOMMENDED OTHER SERVICES:  None at this time  CONSULTED AND AGREED WITH PLAN OF CARE: Patient  PLAN FOR NEXT SESSION: complete ROM and MMT measures (limited by time constraints at eval)  Danelle Earthly, MS, OTR/L  11/10/2022, 8:45 PM

## 2022-11-11 ENCOUNTER — Other Ambulatory Visit: Payer: Self-pay

## 2022-11-11 MED ORDER — CLOPIDOGREL BISULFATE 75 MG PO TABS
75.0000 mg | ORAL_TABLET | Freq: Every day | ORAL | 1 refills | Status: DC
  Filled 2022-11-11 – 2022-11-13 (×4): qty 90, 90d supply, fill #0

## 2022-11-12 ENCOUNTER — Ambulatory Visit: Payer: PPO

## 2022-11-12 ENCOUNTER — Ambulatory Visit: Payer: PPO | Admitting: Physical Therapy

## 2022-11-12 ENCOUNTER — Other Ambulatory Visit: Payer: Self-pay

## 2022-11-12 DIAGNOSIS — R2689 Other abnormalities of gait and mobility: Secondary | ICD-10-CM

## 2022-11-12 DIAGNOSIS — R262 Difficulty in walking, not elsewhere classified: Secondary | ICD-10-CM

## 2022-11-12 DIAGNOSIS — R278 Other lack of coordination: Secondary | ICD-10-CM

## 2022-11-12 DIAGNOSIS — R269 Unspecified abnormalities of gait and mobility: Secondary | ICD-10-CM

## 2022-11-12 DIAGNOSIS — R2681 Unsteadiness on feet: Secondary | ICD-10-CM

## 2022-11-12 DIAGNOSIS — M6281 Muscle weakness (generalized): Secondary | ICD-10-CM | POA: Diagnosis not present

## 2022-11-12 NOTE — Therapy (Signed)
OUTPATIENT PHYSICAL THERAPY NEURO TREATMENT      Patient Name: [name] MRN: [id number] DOB:09/09/1950, 72 y.o., female Today's Date: 11/12/2022   PCP:  Enid Baas, MD   REFERRING PROVIDER: Morene Crocker, MD   END OF SESSION:  PT End of Session - 11/12/22 1344     Visit Number 42    Number of Visits 64    Date for PT Re-Evaluation 01/29/23    Authorization Type [employer]    Progress Note Due on Visit 50    PT Start Time 1400    PT Stop Time 1444    PT Time Calculation (min) 44 min    Equipment Utilized During Treatment Gait belt    Activity Tolerance Patient tolerated treatment well;No increased pain    Behavior During Therapy [facility] for tasks assessed/performed                                Past Medical History:  Diagnosis Date   Abdominal pain 12/06/2013   Abnormality of gait 01/26/2013   Altered bowel habits 12/11/2017   Bruises easily    Constipation    Crohn's disease (HCC)    Depression    Dizziness and giddiness 01/26/2013   Fecal incontinence alternating with constipation 12/11/2017   GERD (gastroesophageal reflux disease)    Hemianopia    left   Hemiparesis and alteration of sensations as late effects of stroke (HCC) 01/26/2013   Hyperlipidemia    Hypertension    Hypothyroidism    Lump in female breast    Panic disorder    PONV (postoperative nausea and vomiting)    Stroke (HCC) 2009   Tremor, essential 04/24/2020   Type II diabetes mellitus (HCC)    Past Surgical History:  Procedure Laterality Date   ANAL FISSURE REPAIR  2008   APPENDECTOMY  1986   BREAST BIOPSY Left 2006   neg   BREAST LUMPECTOMY Right 1990's   CESAREAN SECTION  1981; 1983   CHOLECYSTECTOMY  1986   SMALL INTESTINE SURGERY  05/1991   TUBAL LIGATION  1983   Patient Active Problem List   Diagnosis Date Noted   Bone spur 06/13/2021   Acute CVA (cerebrovascular accident) (HCC) 03/07/2021   CVA (cerebral vascular accident)  (HCC) 11/01/2020   Chronic pain 04/25/2020   Chronic constipation 04/25/2020   Gastro-esophageal reflux disease without esophagitis 04/25/2020   Hardening of the aorta (main artery of the heart) (HCC) 04/25/2020   History of iron deficiency anemia 04/25/2020   Hypercholesteremia 04/25/2020   Hypertension 04/25/2020   Personal history of transient ischemic attack (TIA), and cerebral infarction without residual deficits 04/25/2020   Pulmonary emphysema (HCC) 04/25/2020   Vitamin D deficiency 04/25/2020   Tremor, essential 04/24/2020   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   Right lower quadrant abdominal pain 08/18/2018   Spastic hemiplegia affecting nondominant side (HCC) 02/03/2014   Diabetes mellitus (HCC) 09/07/2013   Hypothyroidism 09/07/2013   Type 2 diabetes mellitus without complications (HCC) 09/07/2013   Hemiparesis and alteration of sensations as late effects of stroke (HCC) 01/26/2013   Unsteady gait 01/26/2013   Dizziness and giddiness 01/26/2013    ONSET DATE: 2009  REFERRING DIAG: Z86.73 (ICD-10-CM) - History of stroke   THERAPY DIAG:  Difficulty in walking, not elsewhere classified  Other abnormalities of gait and mobility  Unsteadiness on feet  Abnormality of gait and mobility  Rationale for Evaluation and Treatment:  Rehabilitation  SUBJECTIVE:                                                                                                                                                                                             SUBJECTIVE STATEMENT:   Pt reports doing well today. No significant changes since last session.  Pt accompanied by: self  PERTINENT HISTORY: Pt has had one major stroke and 5 mini strokes since 2009. Pt is here since her doctor wants her to work on her strength and balance and she also wants to improve in these categories. Pt has hemianopsia with blindness inferior, left side and superior. Pt initially had stroke in 2009 and  was previously able to walk for long distances. Pt has 650 feet driveway that is gravel and used to walk up and down with the cane. Pt has issues with depth perception. Pt is unable to tell angles of concrete and holes in the ground. Pt has service dogs that are retired, one for guidance and one for helping with picking up and retrieving items. Pt had most recent stroke in January 2023 that further limited her field of vision.  Patient wants to work diligently on her strength, balance, mobility, and in order to improve her ability to ambulate on various surfaces in various scenarios.  Patient wanted to come to outpatient services to utilize equipment to improve her strength and a greater capacity than home health was able to assist her with.  She is doing home exercise program involving mostly standing with upper extremity support and activities to improve her strength.  Patient reports she is very nervous to ambulate without contact-guard assist and gait belt without being able to hold onto a wall or steady surface in order to provide her with tactile cues for the areas around her.  PAIN:  Are you having pain? None  PRECAUTIONS: Fall, hemianopsia  WEIGHT BEARING RESTRICTIONS: No  FALLS: Has patient fallen in last 6 months? No  PATIENT GOALS: Improve gait, improve strength, walk on unsteady surfaces  OBJECTIVE:   TODAY'S TREATMENT:  DATE: 11/12/22 Unless otherwise stated, CGA was provided and gait belt donned in order to ensure pt safety  TA Stair practice  -step taps 2 x 10 to standard step using cane for balance  Walking up x 4 steps with step to pattern, uses R LE to step down.  STS x 10  2 x 10 to step trainer using cane for balance on first 10 and second set holding rail ( L) X 5 reps step on and off step trainer using rail, walk around trainer with cane between  reps.  1 rep going onto and off step trainer with cane  -     PATIENT EDUCATION: Education details:Fear of falling and how this is a natural response and feeling toward higher level interventions.  Person educated: Patient Education method: Explanation Education comprehension: verbalized understanding  HOME EXERCISE PROGRAM:   Access Code: [email or web address] URL: [email or web address] Date: 07/31/2022 Prepared by: Grier Rocher  Exercises - Standing Tandem Balance with Counter Support  - 1 x daily - 3 x weekly - 2 sets - 2 reps - 10 hold - Standing Single Leg Stance with Counter Support  - 1 x daily - 3 x weekly - 2 sets - 2 reps - 5 hold  Updated 05/29/2022: Access Code: [email or web address] URL: [email or web address] Date: 05/29/2022 Prepared by: Temple Pacini  Exercises - Seated March  - 1 x daily - 5 x weekly - 3 sets - 10 reps - Seated Long Arc Quad  - 1 x daily - 7 x weekly - 3 sets - 10 reps -written on handout: seated core twists with holding YTB or RTB 5x weekly 3x10 each side  Previous: Pt performs leg lifts and squats with UE support    GOALS: Goals reviewed with patient? Yes  SHORT TERM GOALS: Target date: 06/24/2022    Patient will be independent in home exercise program to improve strength/mobility for better functional independence with ADLs. Baseline: No HEP currently 9/12: pt completing HEP regularly.  Goal status: MET  LONG TERM GOALS: Target date: 01/29/2023    1.  Patient (> 36 years old) will complete five times sit to stand test in < 15 seconds indicating an increased LE strength and improved balance. Baseline:  Test second visit ; 05/29/2022: 40 sec pull to stance RUE 07/10/22: 22.82 sec. 08/18/22: 16.36 sec with UE support 09/29/22:19.32 sec, 42 sec without UE assist  9/9:19.86 sec no UE assist  Goal status: ONGOING/ PROGRESSING  2.  Patient will increase FOTO score by 8 or more points  to demonstrate statistically significant improvement  in mobility and quality of life.  Baseline: Test second visit; 05/28/2040: 45; 07/10/22: 41, 08/18/22: 45 8/5:48 Goal status: ONGOING  3.  Patient will increase 10 meter walk test to >.43m/s as to improve gait speed for better community ambulation and to reduce fall risk. Baseline: Test visit 2 06/19/22: 53 sec 5/16: 77 sec. . 8/5:46 sec with SPC, 33 sec with RW 9/12: 37.66 sec, 24 sec with 3WW Goal status: ONGOING   4. Patient will improve ability to ambulate on uneven surfaces as evidenced by ability to ambulate over simulated uneven terrain in clinic to improve independence with ambulation. 9/12: pt ambulates over red mat and blue mat without LOB, still not confident on red mat  Goal status: MET  5.  Pt will ascend and descend 4 stairs with unilateral UE assist and with step through pattern of gait and without any sign of imbalance to improve her community mobility.  Baseline: Pt  has R lateral lean with ascending and descending and has LLE internal rotation of foot when stepping down.  Goal status: INITIAL  6.  Pt will step on and off of curb or simulated curb using SPC in order to allow for improved community mobility.  Baseline: Unable, attempted 9/12 Goal status: INITIAL       ASSESSMENT:  CLINICAL IMPRESSION:   Pt presents with good motivation for completion of PT activities. Focussed on functional movement breakdown of stair and curb navigation focusing on initial hip flexion required for movement. Pt still has most difficulty with confidence with step up with cane due to decrease in support it provides compared to the stair rail.  Pt will continue to benefit from skilled physical therapy intervention to address impairments, improve QOL, and attain therapy goals.      OBJECTIVE IMPAIRMENTS: Abnormal gait, decreased activity tolerance, decreased balance, decreased endurance, decreased strength, decreased safety awareness, hypomobility, impaired flexibility, and impaired UE  functional use.   ACTIVITY LIMITATIONS: lifting, bending, standing, stairs, transfers, bed mobility, and locomotion level  PARTICIPATION LIMITATIONS: meal prep, cleaning, shopping, and community activity  PERSONAL FACTORS: Time since onset of injury/illness/exacerbation and 3+ comorbidities: diabetes, HTN, Multiple previous strokes   are also affecting patient's functional outcome.   REHAB POTENTIAL: Fair secondary to time since onset and severely of visual deficits and L UE funcitonal use   CLINICAL DECISION MAKING: Stable/uncomplicated  EVALUATION COMPLEXITY: Low  PLAN:  PT FREQUENCY: 2x/week  PT DURATION: 12 weeks  PLANNED INTERVENTIONS: Therapeutic exercises, Therapeutic activity, Neuromuscular re-education, Balance training, Gait training, Patient/Family education, Self Care, Joint mobilization, Stair training, Moist heat, Manual therapy, and Re-evaluation  PLAN FOR NEXT SESSION:   Eyes closed balance to harness vestibular and proprioceptive senses. Progression to ambulation on red mat.  Ambulation and other functional movement tasks while speaking ( not stopping ambulating when talking)  Working on curb navigation stepping on and off of curbs as well as stair navigation both ascending and descending. Ambulation with cane or 3WW on uneven surfaces ( inclines, declines, etc)    Norman Herrlich PT ,DPT Physical Therapist- [place]  [facility]   2:21 PM 11/12/22

## 2022-11-13 ENCOUNTER — Other Ambulatory Visit: Payer: Self-pay

## 2022-11-13 NOTE — Therapy (Signed)
OUTPATIENT OCCUPATIONAL THERAPY NEURO TREATMENT NOTE  Patient Name: [name] MRN: [id number] DOB:1950/11/18, 72 y.o., female Today's Date: 11/13/2022  PCP: Dr. Enid Baas REFERRING PROVIDER: Dr. Theora Master  END OF SESSION:  OT End of Session - 11/13/22 1333     Visit Number 6    Number of Visits 24    Date for OT Re-Evaluation 01/12/23    Progress Note Due on Visit 10    OT Start Time 1445    OT Stop Time 1530    OT Time Calculation (min) 45 min    Equipment Utilized During Treatment wc    Activity Tolerance Patient tolerated treatment well    Behavior During Therapy [facility] for tasks assessed/performed            Past Medical History:  Diagnosis Date   Abdominal pain 12/06/2013   Abnormality of gait 01/26/2013   Altered bowel habits 12/11/2017   Bruises easily    Constipation    Crohn's disease (HCC)    Depression    Dizziness and giddiness 01/26/2013   Fecal incontinence alternating with constipation 12/11/2017   GERD (gastroesophageal reflux disease)    Hemianopia    left   Hemiparesis and alteration of sensations as late effects of stroke (HCC) 01/26/2013   Hyperlipidemia    Hypertension    Hypothyroidism    Lump in female breast    Panic disorder    PONV (postoperative nausea and vomiting)    Stroke (HCC) 2009   Tremor, essential 04/24/2020   Type II diabetes mellitus (HCC)    Past Surgical History:  Procedure Laterality Date   ANAL FISSURE REPAIR  2008   APPENDECTOMY  1986   BREAST BIOPSY Left 2006   neg   BREAST LUMPECTOMY Right 1990's   CESAREAN SECTION  1981; 1983   CHOLECYSTECTOMY  1986   SMALL INTESTINE SURGERY  05/1991   TUBAL LIGATION  1983   Patient Active Problem List   Diagnosis Date Noted   Bone spur 06/13/2021   Acute CVA (cerebrovascular accident) (HCC) 03/07/2021   CVA (cerebral vascular accident) (HCC) 11/01/2020   Chronic pain 04/25/2020   Chronic constipation 04/25/2020   Gastro-esophageal reflux disease without  esophagitis 04/25/2020   Hardening of the aorta (main artery of the heart) (HCC) 04/25/2020   History of iron deficiency anemia 04/25/2020   Hypercholesteremia 04/25/2020   Hypertension 04/25/2020   Personal history of transient ischemic attack (TIA), and cerebral infarction without residual deficits 04/25/2020   Pulmonary emphysema (HCC) 04/25/2020   Vitamin D deficiency 04/25/2020   Tremor, essential 04/24/2020   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   Right lower quadrant abdominal pain 08/18/2018   Spastic hemiplegia affecting nondominant side (HCC) 02/03/2014   Diabetes mellitus (HCC) 09/07/2013   Hypothyroidism 09/07/2013   Type 2 diabetes mellitus without complications (HCC) 09/07/2013   Hemiparesis and alteration of sensations as late effects of stroke (HCC) 01/26/2013   Unsteady gait 01/26/2013   Dizziness and giddiness 01/26/2013   ONSET DATE: 04/2007  REFERRING DIAG: I63.9 (ICD-10-CM) - CVA (cerebral vascular accident) (HCC)   THERAPY DIAG:  Muscle weakness (generalized)  Other lack of coordination  Rationale for Evaluation and Treatment: Rehabilitation  SUBJECTIVE:  SUBJECTIVE STATEMENT: Pt reports she was pleased with the Estim last session and she felt that it allowed her L hand to relax for several hours following the stim. Pt accompanied by: self  PERTINENT HISTORY: Hx of CVA in 2009 resulting in L sided hemiplegia and hemianopsia.  Pt  reports hx of tremors in the R hand and has attempted use of heavy utensils and weight on the wrist which did not feel was helpful.  No current medication for the tremor.  Pt reports she received a recent cortisone shot in the R hand for pain at the base of the LF and RF.  Pt had a more recent CVA in 2023 which further limited vision and weakness.  PRECAUTIONS: Fall  WEIGHT BEARING RESTRICTIONS: No  PAIN:  Are you having pain? No reports of pain this date  FALLS: Has patient fallen in last 6 months? No  LIVING  ENVIRONMENT: Lives with: lives with their spouse Lives in: 1 level home Stairs:  ramp Has following equipment at home: Single point cane, Walker - 4 wheeled, Shower bench, Grab bars, Ramped entry, and 3 wheeled walker, handicapped height toilet and grab bars by the toilet   PLOF: Independent prior to original CVA in 2009.   PATIENT GOALS: Increase LUE strength (pt feels she lost some strength she had gained after rehab from original stroke, as she had been able to use the LUE as a better stabilizer; see goals).  OBJECTIVE:  HAND DOMINANCE: Right  ADLs: Overall ADLs: Spouse assists  Transfers/ambulation related to ADLs: modified indep from wc  Eating: able to cut food with rocker knife Grooming: assist to apply deodorant under L arm d/t limited active L shoulder abd UB Dressing: occasional assist to don a shirt but pt can clasp a bra in the front and don overhead like a sports bra.  Pt reports she may start trying a sports bra as her L bra strap tends to fall.   LB Dressing: assist to manage clothing fasteners on a pair of pants  Toileting: modified indep Bathing: distant supv within her handicapped accessible shower Tub Shower transfers: modified indep Equipment:  see above  IADLs: Shopping: Spouse manages Light housekeeping: pt helps to load/unload dishwasher  Meal Prep: supv-modified indep with light cold meal prep Community mobility: wc; unable to drive Medication management: indep Landscape architect: spouse manages Handwriting:  NT; pt is R hand dominant  MOBILITY STATUS:  uses manual wc; limited ambulation with AD  POSTURE COMMENTS:  L sided hemiplegia  ACTIVITY TOLERANCE: Activity tolerance: to be further assessed  FUNCTIONAL OUTCOME MEASURES: FOTO: 29; predicted 36  UPPER EXTREMITY ROM:  Time constraints limited additional measurements; will complete next session  Active ROM Right Eval WNL Left eval  Shoulder flexion   20  Shoulder abduction  66  Shoulder  adduction    Shoulder extension    Shoulder internal rotation    Shoulder external rotation    Elbow flexion    Elbow extension  -55  Wrist flexion    Wrist extension  -6  Wrist ulnar deviation    Wrist radial deviation    Wrist pronation    Wrist supination  70  (Blank rows = not tested)  UPPER EXTREMITY MMT: To be tested next session  MMT Right eval Left eval  Shoulder flexion  2-/5  Shoulder abduction  2/5  Shoulder adduction    Shoulder extension    Shoulder internal rotation    Shoulder external rotation    Middle trapezius    Lower trapezius    Elbow flexion    Elbow extension  2/5  Wrist flexion    Wrist extension  2-/5  Wrist ulnar deviation    Wrist radial deviation    Wrist pronation    Wrist supination  3-/5  (  Blank rows = not tested)  HAND FUNCTION: Grip strength: Right: 18 lbs; Left: 4 lbs, Lateral pinch: Right: 13 lbs, Left: 6 lbs, and 3 point pinch: Right: 11 lbs, Left: unable lbs  COORDINATION:  9 Hole Peg test: Right: NT sec; Left: NT/unable sec  SENSATION: Light touch: WFL  EDEMA: No visible edema  MUSCLE TONE: LUE: Severe spasticity  COGNITION: Overall cognitive status: Within functional limits for tasks assessed  VISION: Baseline vision: Wears glasses all the time  VISION ASSESSMENT: L hemianopsia following CVA in 2009  PERCEPTION: Not tested  PRAXIS: Impaired: Motor planning  OBSERVATIONS:  Pt pleasant and cooperative.  Verbalizes desire to regain some strength in the LUE, but appropriate expectation for goal to use L hand/arm as a better stabilizer for ADL supplies.  TODAY'S TREATMENT:                                                                                                                              DATE: 11/12/22 Moist heat applied to L shoulder intermittently throughout session and simultaneous to exercises noted below in order to promote muscle relaxation and normalizing tone in the LUE.  Therapeutic Exercise: Pt.  tolerated PROM, slow gentle prolonged stretching in joint ranges of the LUE for shoulder flexion, abduction, elbow flexion, and extension, forearm supination, wrist flexion, extension, and digit MP, PIP, and DIP flexion, and extension.    Estim attended: Tx time: 10 min, 30-32 mA CC, duty cycle 50%, ramp 5 sec, cycle time 10/10; electrodes placed at proximal and distal forearm, dorsal aspect, to promote wrist and digit extension to normalize tone in the hand.  Alternated reps of grip squeezes during off time, and assisted with passive digit extension when cycle was ramping up.  Self Care: Pt inquired about wc attachments for LUE support and whether she needed one or not.  OT reviewed options, including arm trough vs lap or half tray and reinforced benefits of supportive positioning at the shoulder, elbow, wrist/hand to reduce contractures, pain, edema, or injury should the arm dangle past the armrest.  Pt currently finding benefit from use of rolled towel or pillow to abduct arm while in wc.  L wrist does hang down into flexion, and advised pt on ensuring wrist extension stretching is performed regularly, vs wearing resting hand splint, vs using arm trough or pillow in lap.  Pt acknowledged understanding of education and options and is content with current stretching regimen for her hand and using the towel or pillow and states that her arm usually does not fall to the outside of her arm rest but rather into her lap.  PATIENT EDUCATION: Education details: wc attachments to promote supportive positioning of LUE Person educated: Patient Education method: Explanation Education comprehension: verbalized understanding  HOME EXERCISE PROGRAM: R hand self ROM on the R hand to promote widening at the A1 pulley, L hand towel squeezes, LUE table slides R hand over L  GOALS: Goals reviewed with  patient? Yes  SHORT TERM GOALS: Target date: 12/01/22 (6 weeks)  Pt will be indep to perform HEP for improving  LUE strength for stabilizing ADL supplies. Baseline: Eval: HEP not yet initiated Goal status: INITIAL  LONG TERM GOALS: Target date: 01/12/23 (12 weeks)  Pt will increase FOTO score to 36 or better to indicate improvement in self perceived functional use of the L arm with daily tasks. Baseline: Eval: 29 Goal status: INITIAL  2.  Pt will increase L grip strength by 5 or more to be able to stabilize a food can in the L hand while the R hand manipulates a can opener. Baseline: Eval: L grip 4 lbs (pt reports previously able to manage this task, but unable over the last several years) Goal status: INITIAL  3.  Pt will be indep with LUE positioning strategies to promote good skin integrity beneath L axilla/arm/L side of trunk Baseline: Eval: Pt reports excessive sweating beneath L axilla and difficulty maintaining LUE into slight abd; initiated educ on wc arm trough or pillow placement while seated in wc Goal status: INITIAL  4.  Pt will increase LUE strength grossly 1/2 muscle grade in order to stabilize ADL supplies against trunk and improve ability to reposition LUE during self care tasks. Baseline: Eval: Left shoulder flexion: 2-/5, abduction: 2/5, elbow extension: 2/5, wrist extension: 3-/5 Goal status: INITIAL  5.  Pt will don/doff a sports bra with modified indep. Baseline: Eval: pt reports L bra strap routinely falls and she may try a sports bra Goal status: INITIAL  ASSESSMENT: CLINICAL IMPRESSION: Pt reports she was pleased with the Estim last session and she felt that it allowed her L hand to relax for several hours following the stim.  Completed Estim again today with good tolerance, allowing intermittent resistance training for L grip strengthening when timed with off cycle of stimulation, though manual assist is still required to fully extend digits after reps of gripping despite use of Estim.  Pt reports that she has been looking into possibly getting a [employer] unit for home use,  but will continue to look into this option.  OT advised on benefits of using Estim prior to ADLs to relax hand for easier hand hygiene, nail care, etc.  Reviewed supportive positioning strategies for wc attachments as noted above.  Pt appreciative of education but does not feel the need to pursue an attachment at this time.  Pt continues to benefit from skilled OT to provide therapeutic exercises, neuromuscular re-education, therapeutic activities, and ADL training in order to maximize indep with daily tasks.     PERFORMANCE DEFICITS: in functional skills including ADLs, IADLs, coordination, dexterity, tone, ROM, strength, flexibility, Fine motor control, Gross motor control, hearing, mobility, balance, body mechanics, endurance, decreased knowledge of use of DME, skin integrity, vision, and UE functional use, and psychosocial skills including coping strategies, environmental adaptation, habits, interpersonal interactions, and routines and behaviors.   IMPAIRMENTS: are limiting patient from ADLs and IADLs.   CO-MORBIDITIES: has co-morbidities such as hemianopsia, emphysema, tremor, spastic hemiplegia  that affects occupational performance. Patient will benefit from skilled OT to address above impairments and improve overall function.  MODIFICATION OR ASSISTANCE TO COMPLETE EVALUATION: Min-Moderate modification of tasks or assist with assess necessary to complete an evaluation.  OT OCCUPATIONAL PROFILE AND HISTORY: Problem focused assessment: Including review of records relating to presenting problem.  CLINICAL DECISION MAKING: Moderate - several treatment options, min-mod task modification necessary  REHAB POTENTIAL: Good for goals  EVALUATION COMPLEXITY: Moderate  PLAN:  OT FREQUENCY: 2x/week  OT DURATION: 12 weeks  PLANNED INTERVENTIONS: self care/ADL training, therapeutic exercise, therapeutic activity, neuromuscular re-education, manual therapy, passive range of motion, splinting,  moist heat, cryotherapy, patient/family education, visual/perceptual remediation/compensation, psychosocial skills training, energy conservation, coping strategies training, and DME and/or AE instructions  RECOMMENDED OTHER SERVICES: None at this time  CONSULTED AND AGREED WITH PLAN OF CARE: Patient  PLAN FOR NEXT SESSION: complete ROM and MMT measures (limited by time constraints at eval)  Danelle Earthly, MS, OTR/L  11/13/2022, 1:38 PM

## 2022-11-15 DIAGNOSIS — E1142 Type 2 diabetes mellitus with diabetic polyneuropathy: Secondary | ICD-10-CM | POA: Diagnosis not present

## 2022-11-17 ENCOUNTER — Ambulatory Visit: Payer: PPO | Admitting: Occupational Therapy

## 2022-11-17 ENCOUNTER — Ambulatory Visit: Payer: PPO | Admitting: Physical Therapy

## 2022-11-19 ENCOUNTER — Ambulatory Visit: Payer: PPO | Admitting: Physical Therapy

## 2022-11-19 ENCOUNTER — Ambulatory Visit: Payer: PPO | Admitting: Occupational Therapy

## 2022-11-19 NOTE — Therapy (Deleted)
OUTPATIENT PHYSICAL THERAPY NEURO TREATMENT      Patient Name: [name] MRN: [id number] DOB:26-Nov-1950, 72 y.o., female Today's Date: 11/19/2022   PCP:  Enid Baas, MD   REFERRING PROVIDER: Morene Crocker, MD   END OF SESSION:                       Past Medical History:  Diagnosis Date   Abdominal pain 12/06/2013   Abnormality of gait 01/26/2013   Altered bowel habits 12/11/2017   Bruises easily    Constipation    Crohn's disease (HCC)    Depression    Dizziness and giddiness 01/26/2013   Fecal incontinence alternating with constipation 12/11/2017   GERD (gastroesophageal reflux disease)    Hemianopia    left   Hemiparesis and alteration of sensations as late effects of stroke (HCC) 01/26/2013   Hyperlipidemia    Hypertension    Hypothyroidism    Lump in female breast    Panic disorder    PONV (postoperative nausea and vomiting)    Stroke (HCC) 2009   Tremor, essential 04/24/2020   Type II diabetes mellitus (HCC)    Past Surgical History:  Procedure Laterality Date   ANAL FISSURE REPAIR  2008   APPENDECTOMY  1986   BREAST BIOPSY Left 2006   neg   BREAST LUMPECTOMY Right 1990's   CESAREAN SECTION  1981; 1983   CHOLECYSTECTOMY  1986   SMALL INTESTINE SURGERY  05/1991   TUBAL LIGATION  1983   Patient Active Problem List   Diagnosis Date Noted   Bone spur 06/13/2021   Acute CVA (cerebrovascular accident) (HCC) 03/07/2021   CVA (cerebral vascular accident) (HCC) 11/01/2020   Chronic pain 04/25/2020   Chronic constipation 04/25/2020   Gastro-esophageal reflux disease without esophagitis 04/25/2020   Hardening of the aorta (main artery of the heart) (HCC) 04/25/2020   History of iron deficiency anemia 04/25/2020   Hypercholesteremia 04/25/2020   Hypertension 04/25/2020   Personal history of transient ischemic attack (TIA), and cerebral infarction without residual deficits 04/25/2020   Pulmonary emphysema (HCC) 04/25/2020    Vitamin D deficiency 04/25/2020   Tremor, essential 04/24/2020   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   Right lower quadrant abdominal pain 08/18/2018   Spastic hemiplegia affecting nondominant side (HCC) 02/03/2014   Diabetes mellitus (HCC) 09/07/2013   Hypothyroidism 09/07/2013   Type 2 diabetes mellitus without complications (HCC) 09/07/2013   Hemiparesis and alteration of sensations as late effects of stroke (HCC) 01/26/2013   Unsteady gait 01/26/2013   Dizziness and giddiness 01/26/2013    ONSET DATE: 2009  REFERRING DIAG: Z86.73 (ICD-10-CM) - History of stroke   THERAPY DIAG:  Muscle weakness (generalized)  Difficulty in walking, not elsewhere classified  Other abnormalities of gait and mobility  Unsteadiness on feet  Abnormality of gait and mobility  Rationale for Evaluation and Treatment: Rehabilitation  SUBJECTIVE:  SUBJECTIVE STATEMENT:   Pt reports doing well today. No significant changes since last session.  Pt accompanied by: self  PERTINENT HISTORY: Pt has had one major stroke and 5 mini strokes since 2009. Pt is here since her doctor wants her to work on her strength and balance and she also wants to improve in these categories. Pt has hemianopsia with blindness inferior, left side and superior. Pt initially had stroke in 2009 and was previously able to walk for long distances. Pt has 650 feet driveway that is gravel and used to walk up and down with the cane. Pt has issues with depth perception. Pt is unable to tell angles of concrete and holes in the ground. Pt has service dogs that are retired, one for guidance and one for helping with picking up and retrieving items. Pt had most recent stroke in January 2023 that further limited her field of vision.  Patient wants to  work diligently on her strength, balance, mobility, and in order to improve her ability to ambulate on various surfaces in various scenarios.  Patient wanted to come to outpatient services to utilize equipment to improve her strength and a greater capacity than home health was able to assist her with.  She is doing home exercise program involving mostly standing with upper extremity support and activities to improve her strength.  Patient reports she is very nervous to ambulate without contact-guard assist and gait belt without being able to hold onto a wall or steady surface in order to provide her with tactile cues for the areas around her.  PAIN:  Are you having pain? None  PRECAUTIONS: Fall, hemianopsia  WEIGHT BEARING RESTRICTIONS: No  FALLS: Has patient fallen in last 6 months? No  PATIENT GOALS: Improve gait, improve strength, walk on unsteady surfaces  OBJECTIVE:   TODAY'S TREATMENT:                                                                                                                              DATE: 11/19/22 Unless otherwise stated, CGA was provided and gait belt donned in order to ensure pt safety  TA - stair and curb training  Stair practice  -step taps 2 x 10 to standard step using cane for balance  Walking up x 4 steps with step to pattern, uses R LE to step down.  STS x 10  2 x 10 to step trainer using cane for balance on first 10 and second set holding rail ( L) X 5 reps step on and off step trainer using rail, walk around trainer with cane between reps.  1 rep going onto and off step trainer with cane  -     PATIENT EDUCATION: Education details:Fear of falling and how this is a natural response and feeling toward higher level interventions.  Person educated: Patient Education method: Explanation Education comprehension: verbalized understanding  HOME EXERCISE PROGRAM:   Access Code: [email or web address] URL: [email or web address] Date:  07/31/2022  Prepared by: Grier Rocher  Exercises - Standing Tandem Balance with Counter Support  - 1 x daily - 3 x weekly - 2 sets - 2 reps - 10 hold - Standing Single Leg Stance with Counter Support  - 1 x daily - 3 x weekly - 2 sets - 2 reps - 5 hold  Updated 05/29/2022: Access Code: [email or web address] URL: [email or web address] Date: 05/29/2022 Prepared by: Temple Pacini  Exercises - Seated March  - 1 x daily - 5 x weekly - 3 sets - 10 reps - Seated Long Arc Quad  - 1 x daily - 7 x weekly - 3 sets - 10 reps -written on handout: seated core twists with holding YTB or RTB 5x weekly 3x10 each side  Previous: Pt performs leg lifts and squats with UE support    GOALS: Goals reviewed with patient? Yes  SHORT TERM GOALS: Target date: 06/24/2022    Patient will be independent in home exercise program to improve strength/mobility for better functional independence with ADLs. Baseline: No HEP currently 9/12: pt completing HEP regularly.  Goal status: MET  LONG TERM GOALS: Target date: 01/29/2023    1.  Patient (> 55 years old) will complete five times sit to stand test in < 15 seconds indicating an increased LE strength and improved balance. Baseline:  Test second visit ; 05/29/2022: 40 sec pull to stance RUE 07/10/22: 22.82 sec. 08/18/22: 16.36 sec with UE support 09/29/22:19.32 sec, 42 sec without UE assist  9/9:19.86 sec no UE assist  Goal status: ONGOING/ PROGRESSING  2.  Patient will increase FOTO score by 8 or more points  to demonstrate statistically significant improvement in mobility and quality of life.  Baseline: Test second visit; 05/28/2040: 45; 07/10/22: 41, 08/18/22: 45 8/5:48 Goal status: ONGOING  3.  Patient will increase 10 meter walk test to >.78m/s as to improve gait speed for better community ambulation and to reduce fall risk. Baseline: Test visit 2 06/19/22: 53 sec 5/16: 77 sec. . 8/5:46 sec with SPC, 33 sec with RW 9/12: 37.66 sec, 24 sec with 3WW Goal status:  ONGOING   4. Patient will improve ability to ambulate on uneven surfaces as evidenced by ability to ambulate over simulated uneven terrain in clinic to improve independence with ambulation. 9/12: pt ambulates over red mat and blue mat without LOB, still not confident on red mat  Goal status: MET  5.  Pt will ascend and descend 4 stairs with unilateral UE assist and with step through pattern of gait and without any sign of imbalance to improve her community mobility.  Baseline: Pt has R lateral lean with ascending and descending and has LLE internal rotation of foot when stepping down.  Goal status: INITIAL  6.  Pt will step on and off of curb or simulated curb using SPC in order to allow for improved community mobility.  Baseline: Unable, attempted 9/12 Goal status: INITIAL       ASSESSMENT:  CLINICAL IMPRESSION:   Pt presents with good motivation for completion of PT activities. Focussed on functional movement breakdown of stair and curb navigation focusing on initial hip flexion required for movement. Pt still has most difficulty with confidence with step up with cane due to decrease in support it provides compared to the stair rail.  Pt will continue to benefit from skilled physical therapy intervention to address impairments, improve QOL, and attain therapy goals.      OBJECTIVE IMPAIRMENTS: Abnormal gait, decreased activity tolerance, decreased balance, decreased endurance, decreased  strength, decreased safety awareness, hypomobility, impaired flexibility, and impaired UE functional use.   ACTIVITY LIMITATIONS: lifting, bending, standing, stairs, transfers, bed mobility, and locomotion level  PARTICIPATION LIMITATIONS: meal prep, cleaning, shopping, and community activity  PERSONAL FACTORS: Time since onset of injury/illness/exacerbation and 3+ comorbidities: diabetes, HTN, Multiple previous strokes   are also affecting patient's functional outcome.   REHAB POTENTIAL: Fair  secondary to time since onset and severely of visual deficits and L UE funcitonal use   CLINICAL DECISION MAKING: Stable/uncomplicated  EVALUATION COMPLEXITY: Low  PLAN:  PT FREQUENCY: 2x/week  PT DURATION: 12 weeks  PLANNED INTERVENTIONS: Therapeutic exercises, Therapeutic activity, Neuromuscular re-education, Balance training, Gait training, Patient/Family education, Self Care, Joint mobilization, Stair training, Moist heat, Manual therapy, and Re-evaluation  PLAN FOR NEXT SESSION:   Eyes closed balance to harness vestibular and proprioceptive senses. Progression to ambulation on red mat.  Ambulation and other functional movement tasks while speaking ( not stopping ambulating when talking)  Working on curb navigation stepping on and off of curbs as well as stair navigation both ascending and descending. Ambulation with cane or 3WW on uneven surfaces ( inclines, declines, etc)    Norman Herrlich PT ,DPT Physical Therapist-   [facility]   9:58 AM 11/19/22

## 2022-11-20 ENCOUNTER — Other Ambulatory Visit: Payer: Self-pay

## 2022-11-21 ENCOUNTER — Other Ambulatory Visit: Payer: Self-pay

## 2022-11-21 MED ORDER — RAMIPRIL 2.5 MG PO CAPS
2.5000 mg | ORAL_CAPSULE | Freq: Every day | ORAL | 1 refills | Status: DC
  Filled 2022-11-21: qty 90, 90d supply, fill #0
  Filled 2023-02-17: qty 90, 90d supply, fill #1

## 2022-11-23 ENCOUNTER — Other Ambulatory Visit: Payer: Self-pay

## 2022-11-23 MED ORDER — GABAPENTIN 300 MG PO CAPS
300.0000 mg | ORAL_CAPSULE | Freq: Three times a day (TID) | ORAL | 1 refills | Status: DC
  Filled 2022-11-23: qty 270, 90d supply, fill #0

## 2022-11-24 ENCOUNTER — Ambulatory Visit: Payer: PPO | Admitting: Physical Therapy

## 2022-11-24 ENCOUNTER — Ambulatory Visit: Payer: PPO

## 2022-11-24 ENCOUNTER — Other Ambulatory Visit: Payer: Self-pay

## 2022-11-24 DIAGNOSIS — R278 Other lack of coordination: Secondary | ICD-10-CM

## 2022-11-24 DIAGNOSIS — R2689 Other abnormalities of gait and mobility: Secondary | ICD-10-CM

## 2022-11-24 DIAGNOSIS — R262 Difficulty in walking, not elsewhere classified: Secondary | ICD-10-CM

## 2022-11-24 DIAGNOSIS — R2681 Unsteadiness on feet: Secondary | ICD-10-CM

## 2022-11-24 DIAGNOSIS — R269 Unspecified abnormalities of gait and mobility: Secondary | ICD-10-CM

## 2022-11-24 DIAGNOSIS — M6281 Muscle weakness (generalized): Secondary | ICD-10-CM | POA: Diagnosis not present

## 2022-11-24 NOTE — Therapy (Signed)
OUTPATIENT OCCUPATIONAL THERAPY NEURO TREATMENT NOTE  Patient Name: [name] MRN: [id number] DOB:01-12-1951, 72 y.o., female Today's Date: 11/24/2022  PCP: Dr. Enid Baas REFERRING PROVIDER: Dr. Theora Master  END OF SESSION:  OT End of Session - 11/24/22 1833     Visit Number 7    Number of Visits 24    Date for OT Re-Evaluation 01/12/23    Progress Note Due on Visit 10    OT Start Time 1445    OT Stop Time 1530    OT Time Calculation (min) 45 min    Equipment Utilized During Treatment wc    Activity Tolerance Patient tolerated treatment well    Behavior During Therapy [facility] for tasks assessed/performed            Past Medical History:  Diagnosis Date   Abdominal pain 12/06/2013   Abnormality of gait 01/26/2013   Altered bowel habits 12/11/2017   Bruises easily    Constipation    Crohn's disease (HCC)    Depression    Dizziness and giddiness 01/26/2013   Fecal incontinence alternating with constipation 12/11/2017   GERD (gastroesophageal reflux disease)    Hemianopia    left   Hemiparesis and alteration of sensations as late effects of stroke (HCC) 01/26/2013   Hyperlipidemia    Hypertension    Hypothyroidism    Lump in female breast    Panic disorder    PONV (postoperative nausea and vomiting)    Stroke (HCC) 2009   Tremor, essential 04/24/2020   Type II diabetes mellitus (HCC)    Past Surgical History:  Procedure Laterality Date   ANAL FISSURE REPAIR  2008   APPENDECTOMY  1986   BREAST BIOPSY Left 2006   neg   BREAST LUMPECTOMY Right 1990's   CESAREAN SECTION  1981; 1983   CHOLECYSTECTOMY  1986   SMALL INTESTINE SURGERY  05/1991   TUBAL LIGATION  1983   Patient Active Problem List   Diagnosis Date Noted   Bone spur 06/13/2021   Acute CVA (cerebrovascular accident) (HCC) 03/07/2021   CVA (cerebral vascular accident) (HCC) 11/01/2020   Chronic pain 04/25/2020   Chronic constipation 04/25/2020   Gastro-esophageal reflux disease without  esophagitis 04/25/2020   Hardening of the aorta (main artery of the heart) (HCC) 04/25/2020   History of iron deficiency anemia 04/25/2020   Hypercholesteremia 04/25/2020   Hypertension 04/25/2020   Personal history of transient ischemic attack (TIA), and cerebral infarction without residual deficits 04/25/2020   Pulmonary emphysema (HCC) 04/25/2020   Vitamin D deficiency 04/25/2020   Tremor, essential 04/24/2020   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   Right lower quadrant abdominal pain 08/18/2018   Spastic hemiplegia affecting nondominant side (HCC) 02/03/2014   Diabetes mellitus (HCC) 09/07/2013   Hypothyroidism 09/07/2013   Type 2 diabetes mellitus without complications (HCC) 09/07/2013   Hemiparesis and alteration of sensations as late effects of stroke (HCC) 01/26/2013   Unsteady gait 01/26/2013   Dizziness and giddiness 01/26/2013   ONSET DATE: 04/2007  REFERRING DIAG: I63.9 (ICD-10-CM) - CVA (cerebral vascular accident) (HCC)   THERAPY DIAG:  Muscle weakness (generalized)  Other lack of coordination  Rationale for Evaluation and Treatment: Rehabilitation  SUBJECTIVE:  SUBJECTIVE STATEMENT: Pt reports that she thinks she may be ready to take a break from therapy d/t being tired, but is still eager to look into home Estim options and possible Botox.  See clinical impression below for upcoming plan changes.   Pt accompanied by: self  PERTINENT  HISTORY: Hx of CVA in 2009 resulting in L sided hemiplegia and hemianopsia.  Pt reports hx of tremors in the R hand and has attempted use of heavy utensils and weight on the wrist which did not feel was helpful.  No current medication for the tremor.  Pt reports she received a recent cortisone shot in the R hand for pain at the base of the LF and RF.  Pt had a more recent CVA in 2023 which further limited vision and weakness.  PRECAUTIONS: Fall  WEIGHT BEARING RESTRICTIONS: No  PAIN:  Are you having pain? No reports of pain  this date  FALLS: Has patient fallen in last 6 months? No  LIVING ENVIRONMENT: Lives with: lives with their spouse Lives in: 1 level home Stairs:  ramp Has following equipment at home: Single point cane, Walker - 4 wheeled, Shower bench, Grab bars, Ramped entry, and 3 wheeled walker, handicapped height toilet and grab bars by the toilet   PLOF: Independent prior to original CVA in 2009.   PATIENT GOALS: Increase LUE strength (pt feels she lost some strength she had gained after rehab from original stroke, as she had been able to use the LUE as a better stabilizer; see goals).  OBJECTIVE:  HAND DOMINANCE: Right  ADLs: Overall ADLs: Spouse assists  Transfers/ambulation related to ADLs: modified indep from wc  Eating: able to cut food with rocker knife Grooming: assist to apply deodorant under L arm d/t limited active L shoulder abd UB Dressing: occasional assist to don a shirt but pt can clasp a bra in the front and don overhead like a sports bra.  Pt reports she may start trying a sports bra as her L bra strap tends to fall.   LB Dressing: assist to manage clothing fasteners on a pair of pants  Toileting: modified indep Bathing: distant supv within her handicapped accessible shower Tub Shower transfers: modified indep Equipment:  see above  IADLs: Shopping: Spouse manages Light housekeeping: pt helps to load/unload dishwasher  Meal Prep: supv-modified indep with light cold meal prep Community mobility: wc; unable to drive Medication management: indep Landscape architect: spouse manages Handwriting:  NT; pt is R hand dominant  MOBILITY STATUS:  uses manual wc; limited ambulation with AD  POSTURE COMMENTS:  L sided hemiplegia  ACTIVITY TOLERANCE: Activity tolerance: to be further assessed  FUNCTIONAL OUTCOME MEASURES: FOTO: 29; predicted 36  UPPER EXTREMITY ROM:  Time constraints limited additional measurements; will complete next session  Active ROM Right Eval WNL  Left eval  Shoulder flexion   20  Shoulder abduction  66  Shoulder adduction    Shoulder extension    Shoulder internal rotation    Shoulder external rotation    Elbow flexion    Elbow extension  -55  Wrist flexion    Wrist extension  -6  Wrist ulnar deviation    Wrist radial deviation    Wrist pronation    Wrist supination  70  (Blank rows = not tested)  UPPER EXTREMITY MMT: To be tested next session  MMT Right eval Left eval  Shoulder flexion  2-/5  Shoulder abduction  2/5  Shoulder adduction    Shoulder extension    Shoulder internal rotation    Shoulder external rotation    Middle trapezius    Lower trapezius    Elbow flexion    Elbow extension  2/5  Wrist flexion    Wrist extension  2-/5  Wrist ulnar deviation    Wrist  radial deviation    Wrist pronation    Wrist supination  3-/5  (Blank rows = not tested)  HAND FUNCTION: Grip strength: Right: 18 lbs; Left: 4 lbs, Lateral pinch: Right: 13 lbs, Left: 6 lbs, and 3 point pinch: Right: 11 lbs, Left: unable lbs  COORDINATION:  9 Hole Peg test: Right: NT sec; Left: NT/unable sec  SENSATION: Light touch: WFL  EDEMA: No visible edema  MUSCLE TONE: LUE: Severe spasticity  COGNITION: Overall cognitive status: Within functional limits for tasks assessed  VISION: Baseline vision: Wears glasses all the time  VISION ASSESSMENT: L hemianopsia following CVA in 2009  PERCEPTION: Not tested  PRAXIS: Impaired: Motor planning  OBSERVATIONS:  Pt pleasant and cooperative.  Verbalizes desire to regain some strength in the LUE, but appropriate expectation for goal to use L hand/arm as a better stabilizer for ADL supplies.  TODAY'S TREATMENT:                                                                                                                              DATE: 11/24/22 Moist heat applied to L shoulder intermittently throughout session and simultaneous to exercises noted below in order to promote muscle  relaxation and normalizing tone in the LUE.  Therapeutic Exercise: Pt. tolerated PROM, slow gentle prolonged stretching in LUE joint ranges distally, with focus on L elbow ext, forearm sup, and wrist and digit ext.     Estim attended: Tx time: 10 min, 30-32 mA CC, duty cycle 50%, ramp 5 sec, cycle time 10/10; electrodes placed at proximal and distal forearm, dorsal aspect, to promote wrist and digit extension to normalize tone in the hand.  Pt cued to attempt active extension during "ramp up" and using R hand to fully extend the L hand digits as needed.  Therapeutic Activity: Reviewed benefits of Saebo and provided written instructions for pt to follow up with neurologist to request script for SaeboStim One unit.  Encouraged pt also follow up with neurologist for referral for Botox assessment.  Pt had high copay when she had Botox years ago, but encouraged pt to seek referral as her insurance has changed since her last rounds many years ago.  Reviewed poc and educated pt that she would likely get more benefit from therapy once her spasticity is better managed.  Educated pt on additional potential options for stroke rehab, including possibility of Vivistim evaluation in the future, but reinforced that spasticity would need to be better managed.  Pt receptive to all.    PATIENT EDUCATION: Education details: poc; may hold/d/c OT to allow for spasticity management Person educated: Patient Education method: Explanation Education comprehension: verbalized understanding  HOME EXERCISE PROGRAM: R hand self ROM on the R hand to promote widening at the A1 pulley, L hand towel squeezes, LUE table slides R hand over L  GOALS: Goals reviewed with patient? Yes  SHORT TERM GOALS: Target date: 12/01/22 (6 weeks)  Pt will be indep to  perform HEP for improving LUE strength for stabilizing ADL supplies. Baseline: Eval: HEP not yet initiated Goal status: INITIAL  LONG TERM GOALS: Target date: 01/12/23 (12  weeks)  Pt will increase FOTO score to 36 or better to indicate improvement in self perceived functional use of the L arm with daily tasks. Baseline: Eval: 29 Goal status: INITIAL  2.  Pt will increase L grip strength by 5 or more to be able to stabilize a food can in the L hand while the R hand manipulates a can opener. Baseline: Eval: L grip 4 lbs (pt reports previously able to manage this task, but unable over the last several years) Goal status: INITIAL  3.  Pt will be indep with LUE positioning strategies to promote good skin integrity beneath L axilla/arm/L side of trunk Baseline: Eval: Pt reports excessive sweating beneath L axilla and difficulty maintaining LUE into slight abd; initiated educ on wc arm trough or pillow placement while seated in wc Goal status: INITIAL  4.  Pt will increase LUE strength grossly 1/2 muscle grade in order to stabilize ADL supplies against trunk and improve ability to reposition LUE during self care tasks. Baseline: Eval: Left shoulder flexion: 2-/5, abduction: 2/5, elbow extension: 2/5, wrist extension: 3-/5 Goal status: INITIAL  5.  Pt will don/doff a sports bra with modified indep. Baseline: Eval: pt reports L bra strap routinely falls and she may try a sports bra Goal status: INITIAL  ASSESSMENT: CLINICAL IMPRESSION: LUE spasticity continues to be a barrier in strengthening LUE, as well as maintaining ROM for ADLs and for maintaining proper positioning of the arm during ADLs.  Pt continues to be interested in seeking home Estim unit options, but reports she could not remember what she needed to do.  OT provided written instructions for reaching out to neurologist for a referral for SaeboStim One Unit to manage LUE spasticity.  Encouraged pt also follow up with neurologist for referral for Botox assessment.  Pt had high copay when she had Botox years ago, but encouraged pt to seek referral as her insurance has changed since her last rounds many years  ago.  Reviewed poc and educated pt that she would likely get more benefit from therapy once her spasticity is better managed.  Educated pt on additional potential options for stroke rehab, including possibility of Vivistim evaluation in the future, but reinforced that spasticity would need to be better managed.  Pt receptive to all and reported that she had been thinking about taking a break from therapy, reporting only that she feels tired and just needs some time.  Time constraints limited further discussion, but OT will plan to follow up with pt later in the week via phone to discuss holding or d/c after she follows up with neurology for above noted recommendation/referrals.  Pt in agreement with plan.  PERFORMANCE DEFICITS: in functional skills including ADLs, IADLs, coordination, dexterity, tone, ROM, strength, flexibility, Fine motor control, Gross motor control, hearing, mobility, balance, body mechanics, endurance, decreased knowledge of use of DME, skin integrity, vision, and UE functional use, and psychosocial skills including coping strategies, environmental adaptation, habits, interpersonal interactions, and routines and behaviors.   IMPAIRMENTS: are limiting patient from ADLs and IADLs.   CO-MORBIDITIES: has co-morbidities such as hemianopsia, emphysema, tremor, spastic hemiplegia  that affects occupational performance. Patient will benefit from skilled OT to address above impairments and improve overall function.  MODIFICATION OR ASSISTANCE TO COMPLETE EVALUATION: Min-Moderate modification of tasks or assist with assess necessary to  complete an evaluation.  OT OCCUPATIONAL PROFILE AND HISTORY: Problem focused assessment: Including review of records relating to presenting problem.  CLINICAL DECISION MAKING: Moderate - several treatment options, min-mod task modification necessary  REHAB POTENTIAL: Good for goals  EVALUATION COMPLEXITY: Moderate    PLAN:  OT FREQUENCY: 2x/week  OT  DURATION: 12 weeks  PLANNED INTERVENTIONS: self care/ADL training, therapeutic exercise, therapeutic activity, neuromuscular re-education, manual therapy, passive range of motion, splinting, moist heat, cryotherapy, patient/family education, visual/perceptual remediation/compensation, psychosocial skills training, energy conservation, coping strategies training, and DME and/or AE instructions  RECOMMENDED OTHER SERVICES: None at this time  CONSULTED AND AGREED WITH PLAN OF CARE: Patient  PLAN FOR NEXT SESSION: complete ROM and MMT measures (limited by time constraints at eval)  Danelle Earthly, MS, OTR/L  11/24/2022, 6:38 PM

## 2022-11-24 NOTE — Therapy (Signed)
OUTPATIENT PHYSICAL THERAPY NEURO TREATMENT      Patient Name: [name] MRN: [id number] DOB:10/29/1950, 72 y.o., female Today's Date: 11/24/2022   PCP:  Enid Baas, MD   REFERRING PROVIDER: Morene Crocker, MD   END OF SESSION:  PT End of Session - 11/24/22 1526     Visit Number 43    Number of Visits 64    Date for PT Re-Evaluation 01/29/23    Authorization Type [employer]    Progress Note Due on Visit 50    PT Start Time 1531    PT Stop Time 1613    PT Time Calculation (min) 42 min    Equipment Utilized During Treatment Gait belt    Activity Tolerance Patient tolerated treatment well;No increased pain    Behavior During Therapy [facility] for tasks assessed/performed                                 Past Medical History:  Diagnosis Date   Abdominal pain 12/06/2013   Abnormality of gait 01/26/2013   Altered bowel habits 12/11/2017   Bruises easily    Constipation    Crohn's disease (HCC)    Depression    Dizziness and giddiness 01/26/2013   Fecal incontinence alternating with constipation 12/11/2017   GERD (gastroesophageal reflux disease)    Hemianopia    left   Hemiparesis and alteration of sensations as late effects of stroke (HCC) 01/26/2013   Hyperlipidemia    Hypertension    Hypothyroidism    Lump in female breast    Panic disorder    PONV (postoperative nausea and vomiting)    Stroke (HCC) 2009   Tremor, essential 04/24/2020   Type II diabetes mellitus (HCC)    Past Surgical History:  Procedure Laterality Date   ANAL FISSURE REPAIR  2008   APPENDECTOMY  1986   BREAST BIOPSY Left 2006   neg   BREAST LUMPECTOMY Right 1990's   CESAREAN SECTION  1981; 1983   CHOLECYSTECTOMY  1986   SMALL INTESTINE SURGERY  05/1991   TUBAL LIGATION  1983   Patient Active Problem List   Diagnosis Date Noted   Bone spur 06/13/2021   Acute CVA (cerebrovascular accident) (HCC) 03/07/2021   CVA (cerebral vascular accident)  (HCC) 11/01/2020   Chronic pain 04/25/2020   Chronic constipation 04/25/2020   Gastro-esophageal reflux disease without esophagitis 04/25/2020   Hardening of the aorta (main artery of the heart) (HCC) 04/25/2020   History of iron deficiency anemia 04/25/2020   Hypercholesteremia 04/25/2020   Hypertension 04/25/2020   Personal history of transient ischemic attack (TIA), and cerebral infarction without residual deficits 04/25/2020   Pulmonary emphysema (HCC) 04/25/2020   Vitamin D deficiency 04/25/2020   Tremor, essential 04/24/2020   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   Right lower quadrant abdominal pain 08/18/2018   Spastic hemiplegia affecting nondominant side (HCC) 02/03/2014   Diabetes mellitus (HCC) 09/07/2013   Hypothyroidism 09/07/2013   Type 2 diabetes mellitus without complications (HCC) 09/07/2013   Hemiparesis and alteration of sensations as late effects of stroke (HCC) 01/26/2013   Unsteady gait 01/26/2013   Dizziness and giddiness 01/26/2013    ONSET DATE: 2009  REFERRING DIAG: Z86.73 (ICD-10-CM) - History of stroke   THERAPY DIAG:  Muscle weakness (generalized)  Difficulty in walking, not elsewhere classified  Other abnormalities of gait and mobility  Unsteadiness on feet  Abnormality of gait and mobility  Rationale for Evaluation and Treatment: Rehabilitation  SUBJECTIVE:                                                                                                                                                                                             SUBJECTIVE STATEMENT:   Pt reports doing okay today. Pt requests taking a short break form PT as she deals with the expected passing of her dog as well as some other personal matters.   Pt accompanied by: self  PERTINENT HISTORY: Pt has had one major stroke and 5 mini strokes since 2009. Pt is here since her doctor wants her to work on her strength and balance and she also wants to improve in  these categories. Pt has hemianopsia with blindness inferior, left side and superior. Pt initially had stroke in 2009 and was previously able to walk for long distances. Pt has 650 feet driveway that is gravel and used to walk up and down with the cane. Pt has issues with depth perception. Pt is unable to tell angles of concrete and holes in the ground. Pt has service dogs that are retired, one for guidance and one for helping with picking up and retrieving items. Pt had most recent stroke in January 2023 that further limited her field of vision.  Patient wants to work diligently on her strength, balance, mobility, and in order to improve her ability to ambulate on various surfaces in various scenarios.  Patient wanted to come to outpatient services to utilize equipment to improve her strength and a greater capacity than home health was able to assist her with.  She is doing home exercise program involving mostly standing with upper extremity support and activities to improve her strength.  Patient reports she is very nervous to ambulate without contact-guard assist and gait belt without being able to hold onto a wall or steady surface in order to provide her with tactile cues for the areas around her.  PAIN:  Are you having pain? None  PRECAUTIONS: Fall, hemianopsia  WEIGHT BEARING RESTRICTIONS: No  FALLS: Has patient fallen in last 6 months? No  PATIENT GOALS: Improve gait, improve strength, walk on unsteady surfaces  OBJECTIVE:   TODAY'S TREATMENT:  DATE: 11/24/22 Unless otherwise stated, CGA was provided and gait belt donned in order to ensure pt safety TE - Alternating Step Taps with Counter Support  - 2 sets - 10 reps - Sit to Stand   2 sets - 10 reps - Standing Tandem Balance with Counter Support  - 2 sets - 2 reps - 10 hold - Standing Single Leg Stance with Counter  Support  2 sets - 2 reps - 5 hold - Seated Long Arc Quad with Ankle Weight   2 sets - 10 reps  Walking with dual task of speaking and holding conversation to challenge functional ambulation. X 115 ft      PATIENT EDUCATION: Education details:Pt educated throughout session about proper posture and technique with exercises. Improved exercise technique, movement at target joints, use of target muscles after min to mod verbal, visual, tactile cues.  Person educated: Patient Education method: Explanation Education comprehension: verbalized understanding  HOME EXERCISE PROGRAM:  Access Code: [email or web address] URL: [email or web address] Date: 11/24/2022 Prepared by: Thresa Ross  Exercises - Alternating Step Taps with Counter Support  - 7 x weekly - 2 sets - 10 reps - Sit to Stand  - 1 x daily - 7 x weekly - 2 sets - 10 reps - Standing Tandem Balance with Counter Support  - 1 x daily - 3 x weekly - 2 sets - 2 reps - 10 hold - Standing Single Leg Stance with Counter Support  - 1 x daily - 3 x weekly - 2 sets - 2 reps - 5 hold - Seated Long Arc Quad with Ankle Weight  - 1 x daily - 7 x weekly - 2 sets - 10 reps  Previous: Pt performs leg lifts and squats with UE support    GOALS: Goals reviewed with patient? Yes  SHORT TERM GOALS: Target date: 06/24/2022    Patient will be independent in home exercise program to improve strength/mobility for better functional independence with ADLs. Baseline: No HEP currently 9/12: pt completing HEP regularly.  Goal status: MET  LONG TERM GOALS: Target date: 01/29/2023    1.  Patient (> 33 years old) will complete five times sit to stand test in < 15 seconds indicating an increased LE strength and improved balance. Baseline:  Test second visit ; 05/29/2022: 40 sec pull to stance RUE 07/10/22: 22.82 sec. 08/18/22: 16.36 sec with UE support 09/29/22:19.32 sec, 42 sec without UE assist  9/9:19.86 sec no UE assist  Goal status: ONGOING/  PROGRESSING  2.  Patient will increase FOTO score by 8 or more points  to demonstrate statistically significant improvement in mobility and quality of life.  Baseline: Test second visit; 05/28/2040: 45; 07/10/22: 41, 08/18/22: 45 8/5:48 Goal status: ONGOING  3.  Patient will increase 10 meter walk test to >.67m/s as to improve gait speed for better community ambulation and to reduce fall risk. Baseline: Test visit 2 06/19/22: 53 sec 5/16: 77 sec. . 8/5:46 sec with SPC, 33 sec with RW 9/12: 37.66 sec, 24 sec with 3WW Goal status: ONGOING   4. Patient will improve ability to ambulate on uneven surfaces as evidenced by ability to ambulate over simulated uneven terrain in clinic to improve independence with ambulation. 9/12: pt ambulates over red mat and blue mat without LOB, still not confident on red mat  Goal status: MET  5.  Pt will ascend and descend 4 stairs with unilateral UE assist and with step through pattern of gait and without any sign of imbalance to  improve her community mobility.  Baseline: Pt has R lateral lean with ascending and descending and has LLE internal rotation of foot when stepping down.  Goal status: INITIAL  6.  Pt will step on and off of curb or simulated curb using SPC in order to allow for improved community mobility.  Baseline: Unable, attempted 9/12 Goal status: INITIAL       ASSESSMENT:  CLINICAL IMPRESSION:   Pt presents with good motivation for completion of PT activities. Pt requests taking a brief break from PT interventions due to some extenuating circumstances at home. PT offers one month break form PT while she deals with these things and she is agreeable to this. Pt will take off the month of October and return for PT in November. Pt agrees to call if any changes to this are necessary. PT focusses on new HEP to attenuate further progress based on more recent therapy visits. Pt will continue to benefit from skilled physical therapy intervention to  address impairments, improve QOL, and attain therapy goals.       OBJECTIVE IMPAIRMENTS: Abnormal gait, decreased activity tolerance, decreased balance, decreased endurance, decreased strength, decreased safety awareness, hypomobility, impaired flexibility, and impaired UE functional use.   ACTIVITY LIMITATIONS: lifting, bending, standing, stairs, transfers, bed mobility, and locomotion level  PARTICIPATION LIMITATIONS: meal prep, cleaning, shopping, and community activity  PERSONAL FACTORS: Time since onset of injury/illness/exacerbation and 3+ comorbidities: diabetes, HTN, Multiple previous strokes   are also affecting patient's functional outcome.   REHAB POTENTIAL: Fair secondary to time since onset and severely of visual deficits and L UE funcitonal use   CLINICAL DECISION MAKING: Stable/uncomplicated  EVALUATION COMPLEXITY: Low  PLAN:  PT FREQUENCY: 2x/week  PT DURATION: 12 weeks  PLANNED INTERVENTIONS: Therapeutic exercises, Therapeutic activity, Neuromuscular re-education, Balance training, Gait training, Patient/Family education, Self Care, Joint mobilization, Stair training, Moist heat, Manual therapy, and Re-evaluation  PLAN FOR NEXT SESSION:   1 month break from PT then begin back with the following  Ambulation and other functional movement tasks while speaking ( not stopping ambulating when talking)  Working on curb navigation stepping on and off of curbs as well as stair navigation both ascending and descending. Ambulation with cane or 3WW on uneven surfaces ( inclines, declines, etc)    Norman Herrlich PT ,DPT Physical Therapist- [place]  [facility]   3:26 PM 11/24/22

## 2022-11-26 ENCOUNTER — Ambulatory Visit: Payer: PPO | Admitting: Physical Therapy

## 2022-11-26 ENCOUNTER — Ambulatory Visit: Payer: PPO | Admitting: Gastroenterology

## 2022-12-01 ENCOUNTER — Ambulatory Visit: Payer: PPO | Admitting: Physical Therapy

## 2022-12-01 ENCOUNTER — Ambulatory Visit: Payer: PPO

## 2022-12-02 ENCOUNTER — Other Ambulatory Visit: Payer: Self-pay

## 2022-12-02 MED ORDER — ROSUVASTATIN CALCIUM 20 MG PO TABS
20.0000 mg | ORAL_TABLET | Freq: Every day | ORAL | 5 refills | Status: DC
  Filled 2022-12-02: qty 30, 30d supply, fill #0
  Filled 2023-01-03: qty 30, 30d supply, fill #1

## 2022-12-03 ENCOUNTER — Ambulatory Visit: Payer: PPO

## 2022-12-03 ENCOUNTER — Ambulatory Visit: Payer: PPO | Admitting: Physical Therapy

## 2022-12-08 ENCOUNTER — Ambulatory Visit: Payer: PPO

## 2022-12-08 ENCOUNTER — Other Ambulatory Visit: Payer: Self-pay

## 2022-12-08 ENCOUNTER — Ambulatory Visit: Payer: PPO | Admitting: Physical Therapy

## 2022-12-09 DIAGNOSIS — M65331 Trigger finger, right middle finger: Secondary | ICD-10-CM | POA: Diagnosis not present

## 2022-12-10 ENCOUNTER — Ambulatory Visit: Payer: PPO | Admitting: Physical Therapy

## 2022-12-10 ENCOUNTER — Ambulatory Visit: Payer: PPO

## 2022-12-10 ENCOUNTER — Other Ambulatory Visit: Payer: Self-pay

## 2022-12-15 ENCOUNTER — Ambulatory Visit: Payer: PPO | Admitting: Physical Therapy

## 2022-12-15 ENCOUNTER — Ambulatory Visit: Payer: PPO

## 2022-12-15 DIAGNOSIS — E1142 Type 2 diabetes mellitus with diabetic polyneuropathy: Secondary | ICD-10-CM | POA: Diagnosis not present

## 2022-12-16 DIAGNOSIS — E113393 Type 2 diabetes mellitus with moderate nonproliferative diabetic retinopathy without macular edema, bilateral: Secondary | ICD-10-CM | POA: Diagnosis not present

## 2022-12-16 DIAGNOSIS — E1140 Type 2 diabetes mellitus with diabetic neuropathy, unspecified: Secondary | ICD-10-CM | POA: Diagnosis not present

## 2022-12-17 ENCOUNTER — Ambulatory Visit: Payer: PPO

## 2022-12-17 ENCOUNTER — Ambulatory Visit: Payer: PPO | Admitting: Physical Therapy

## 2022-12-19 ENCOUNTER — Ambulatory Visit: Payer: PPO | Admitting: Podiatry

## 2022-12-19 ENCOUNTER — Encounter: Payer: Self-pay | Admitting: Podiatry

## 2022-12-19 VITALS — Ht 64.0 in | Wt 148.0 lb

## 2022-12-19 DIAGNOSIS — M216X2 Other acquired deformities of left foot: Secondary | ICD-10-CM | POA: Diagnosis not present

## 2022-12-19 DIAGNOSIS — M79675 Pain in left toe(s): Secondary | ICD-10-CM | POA: Diagnosis not present

## 2022-12-19 DIAGNOSIS — M21372 Foot drop, left foot: Secondary | ICD-10-CM

## 2022-12-19 DIAGNOSIS — M79674 Pain in right toe(s): Secondary | ICD-10-CM

## 2022-12-19 DIAGNOSIS — B351 Tinea unguium: Secondary | ICD-10-CM

## 2022-12-22 ENCOUNTER — Ambulatory Visit: Payer: PPO

## 2022-12-22 ENCOUNTER — Ambulatory Visit: Payer: PPO | Admitting: Physical Therapy

## 2022-12-24 ENCOUNTER — Ambulatory Visit: Payer: PPO | Admitting: Physical Therapy

## 2022-12-24 ENCOUNTER — Encounter: Payer: PPO | Admitting: Occupational Therapy

## 2022-12-28 ENCOUNTER — Other Ambulatory Visit: Payer: Self-pay

## 2022-12-29 ENCOUNTER — Ambulatory Visit: Payer: PPO | Admitting: Physical Therapy

## 2022-12-29 ENCOUNTER — Ambulatory Visit: Payer: PPO

## 2022-12-29 NOTE — Progress Notes (Signed)
Chief Complaint  Patient presents with   Nail Problem    [facility]    SUBJECTIVE Patient with a history of diabetes mellitus minimally ambulatory presenting today in a wheelchair presents to office today complaining of elongated, thickened nails that cause pain while ambulating in shoes.  Patient is unable to trim their own nails.  Patient has history of dropfoot deformity/hemiparesis/hemiparesis secondary to stroke.  Patient is here for further evaluation and treatment.   Past Medical History:  Diagnosis Date   Abdominal pain 12/06/2013   Abnormality of gait 01/26/2013   Altered bowel habits 12/11/2017   Bruises easily    Constipation    Crohn's disease (HCC)    Depression    Dizziness and giddiness 01/26/2013   Fecal incontinence alternating with constipation 12/11/2017   GERD (gastroesophageal reflux disease)    Hemianopia    left   Hemiparesis and alteration of sensations as late effects of stroke (HCC) 01/26/2013   Hyperlipidemia    Hypertension    Hypothyroidism    Lump in female breast    Panic disorder    PONV (postoperative nausea and vomiting)    Stroke (HCC) 2009   Tremor, essential 04/24/2020   Type II diabetes mellitus (HCC)    Past Surgical History:  Procedure Laterality Date   ANAL FISSURE REPAIR  2008   APPENDECTOMY  1986   BREAST BIOPSY Left 2006   neg   BREAST LUMPECTOMY Right 1990's   CESAREAN SECTION  1981; 1983   CHOLECYSTECTOMY  1986   SMALL INTESTINE SURGERY  05/1991   TUBAL LIGATION  1983   Allergies  Allergen Reactions   Codeine Other (See Comments)    Syncope.   Morphine And Codeine Nausea And Vomiting and Other (See Comments)    Hallucinations.   Baclofen Nausea Only and Other (See Comments)    dizziness   Penicillins Other (See Comments)    Family history   Sulfamethoxazole Itching    Hands itching    Elemental Sulfur Itching    Per patient happened years ago.   Metformin Hcl Other (See Comments) and Diarrhea    diarrhea      OBJECTIVE General Patient is awake, alert, and oriented x 3 and in no acute distress. Derm Skin is dry and supple bilateral. Negative open lesions or macerations. Remaining integument unremarkable. Nails are tender, long, thickened and dystrophic with subungual debris, consistent with onychomycosis, 1-5 bilateral. No signs of infection noted. Vasc  DP and PT pedal pulses palpable bilaterally. Temperature gradient within normal limits.  Neuro Epicritic and protective threshold sensation diminished bilaterally.  Musculoskeletal Exam hemiparesis is affecting the left lower extremity.  History of stroke.  Pes planovalgus deformity with arch collapse bilateral minimally ambulatory mostly in a wheelchair  ASSESSMENT 1. Diabetes Mellitus w/ peripheral neuropathy.   2.  Pain due to onychomycosis of toenails bilateral 3.  History of stroke causing left-sided hemiparesis  4.  Dropfoot deformity left lower extremity 5.  Pes planovalgus deformity bilateral  PLAN OF CARE -Patient evaluated today -Instructed to maintain good pedal hygiene and foot care. Stressed importance of controlling blood sugar.  -Mechanical debridement of nails 1-5 bilaterally performed using a nail nipper. Filed with dremel without incident.  -Continue AFO LLE.  New AFO brace pending at [facility] - Continue diabetic shoes and insoles from [facility].  Diabetic shoes and insoles medically necessary to support the feet and prevent focal pressure areas of the feet which could potentially progress to diabetic  foot ulcers and support the medial longitudinal arch of the foot bilateral. -Advised against going barefoot -Return to clinic 3 months routine footcare    Felecia Shelling, DPM [facility]  Dr. Felecia Shelling, DPM    [address] [place], [place] [address]                Office [phone]  Fax [phone]

## 2022-12-30 ENCOUNTER — Encounter: Payer: Self-pay | Admitting: Dermatology

## 2022-12-31 ENCOUNTER — Ambulatory Visit: Payer: PPO | Admitting: Physical Therapy

## 2022-12-31 ENCOUNTER — Encounter: Payer: PPO | Admitting: Occupational Therapy

## 2023-01-05 ENCOUNTER — Ambulatory Visit: Payer: PPO | Attending: Neurology | Admitting: Physical Therapy

## 2023-01-05 ENCOUNTER — Ambulatory Visit: Payer: PPO

## 2023-01-05 ENCOUNTER — Other Ambulatory Visit: Payer: Self-pay

## 2023-01-05 DIAGNOSIS — R2689 Other abnormalities of gait and mobility: Secondary | ICD-10-CM | POA: Diagnosis not present

## 2023-01-05 DIAGNOSIS — R262 Difficulty in walking, not elsewhere classified: Secondary | ICD-10-CM | POA: Insufficient documentation

## 2023-01-05 DIAGNOSIS — R269 Unspecified abnormalities of gait and mobility: Secondary | ICD-10-CM | POA: Insufficient documentation

## 2023-01-05 DIAGNOSIS — R278 Other lack of coordination: Secondary | ICD-10-CM | POA: Insufficient documentation

## 2023-01-05 DIAGNOSIS — M6281 Muscle weakness (generalized): Secondary | ICD-10-CM | POA: Insufficient documentation

## 2023-01-05 DIAGNOSIS — R2681 Unsteadiness on feet: Secondary | ICD-10-CM | POA: Insufficient documentation

## 2023-01-05 NOTE — Therapy (Signed)
OUTPATIENT PHYSICAL THERAPY NEURO TREATMENT      Patient Name: [name] MRN: [id number] DOB:1951-01-10, 72 y.o., female Today's Date: 01/05/2023   PCP:  Enid Baas, MD   REFERRING PROVIDER: Morene Crocker, MD   END OF SESSION:  PT End of Session - 01/05/23 1525     Visit Number 44    Number of Visits 64    Date for PT Re-Evaluation 01/29/23    Authorization Type [employer]    Progress Note Due on Visit 50    PT Start Time 1522    PT Stop Time 1605    PT Time Calculation (min) 43 min    Equipment Utilized During Treatment Gait belt    Activity Tolerance Patient tolerated treatment well;No increased pain    Behavior During Therapy [facility] for tasks assessed/performed                                  Past Medical History:  Diagnosis Date   Abdominal pain 12/06/2013   Abnormality of gait 01/26/2013   Altered bowel habits 12/11/2017   Bruises easily    Constipation    Crohn's disease (HCC)    Depression    Dizziness and giddiness 01/26/2013   Fecal incontinence alternating with constipation 12/11/2017   GERD (gastroesophageal reflux disease)    Hemianopia    left   Hemiparesis and alteration of sensations as late effects of stroke (HCC) 01/26/2013   Hyperlipidemia    Hypertension    Hypothyroidism    Lump in female breast    Panic disorder    PONV (postoperative nausea and vomiting)    Stroke (HCC) 2009   Tremor, essential 04/24/2020   Type II diabetes mellitus (HCC)    Past Surgical History:  Procedure Laterality Date   ANAL FISSURE REPAIR  2008   APPENDECTOMY  1986   BREAST BIOPSY Left 2006   neg   BREAST LUMPECTOMY Right 1990's   CESAREAN SECTION  1981; 1983   CHOLECYSTECTOMY  1986   SMALL INTESTINE SURGERY  05/1991   TUBAL LIGATION  1983   Patient Active Problem List   Diagnosis Date Noted   Bone spur 06/13/2021   Acute CVA (cerebrovascular accident) (HCC) 03/07/2021   CVA (cerebral vascular  accident) (HCC) 11/01/2020   Chronic pain 04/25/2020   Chronic constipation 04/25/2020   Gastro-esophageal reflux disease without esophagitis 04/25/2020   Hardening of the aorta (main artery of the heart) (HCC) 04/25/2020   History of iron deficiency anemia 04/25/2020   Hypercholesteremia 04/25/2020   Hypertension 04/25/2020   Personal history of transient ischemic attack (TIA), and cerebral infarction without residual deficits 04/25/2020   Pulmonary emphysema (HCC) 04/25/2020   Vitamin D deficiency 04/25/2020   Tremor, essential 04/24/2020   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   Right lower quadrant abdominal pain 08/18/2018   Spastic hemiplegia affecting nondominant side (HCC) 02/03/2014   Diabetes mellitus (HCC) 09/07/2013   Hypothyroidism 09/07/2013   Type 2 diabetes mellitus without complications (HCC) 09/07/2013   Hemiparesis and alteration of sensations as late effects of stroke (HCC) 01/26/2013   Unsteady gait 01/26/2013   Dizziness and giddiness 01/26/2013    ONSET DATE: 2009  REFERRING DIAG: Z86.73 (ICD-10-CM) - History of stroke   THERAPY DIAG:  Muscle weakness (generalized)  Difficulty in walking, not elsewhere classified  Other abnormalities of gait and mobility  Unsteadiness on feet  Abnormality of gait and mobility  Rationale for Evaluation and Treatment: Rehabilitation  SUBJECTIVE:                                                                                                                                                                                             SUBJECTIVE STATEMENT:   Pt reports she got a hemiwalker and reports she had never heard about it previously. Pt reports she also got new shoes. She has trouble with her new brace in terms of doffing it. Pt reports she did her exercises as much as she was able.   Pt accompanied by: self  PERTINENT HISTORY: Pt has had one major stroke and 5 mini strokes since 2009. Pt is here since her  doctor wants her to work on her strength and balance and she also wants to improve in these categories. Pt has hemianopsia with blindness inferior, left side and superior. Pt initially had stroke in 2009 and was previously able to walk for long distances. Pt has 650 feet driveway that is gravel and used to walk up and down with the cane. Pt has issues with depth perception. Pt is unable to tell angles of concrete and holes in the ground. Pt has service dogs that are retired, one for guidance and one for helping with picking up and retrieving items. Pt had most recent stroke in January 2023 that further limited her field of vision.  Patient wants to work diligently on her strength, balance, mobility, and in order to improve her ability to ambulate on various surfaces in various scenarios.  Patient wanted to come to outpatient services to utilize equipment to improve her strength and a greater capacity than home health was able to assist her with.  She is doing home exercise program involving mostly standing with upper extremity support and activities to improve her strength.  Patient reports she is very nervous to ambulate without contact-guard assist and gait belt without being able to hold onto a wall or steady surface in order to provide her with tactile cues for the areas around her.  PAIN:  Are you having pain? None  PRECAUTIONS: Fall, hemianopsia  WEIGHT BEARING RESTRICTIONS: No  FALLS: Has patient fallen in last 6 months? No  PATIENT GOALS: Improve gait, improve strength, walk on unsteady surfaces  OBJECTIVE:   TODAY'S TREATMENT:  DATE: 01/05/23   Unless otherwise stated, CGA was provided and gait belt donned in order to ensure pt safety  TE Ambulation with new hemiwalker around various obstacles x 75 ft -Consistent step to pattern of gait and cues for continuation  of forward momentum and walking with speaking.  Ambulation 2 x 60 ft with R side of wall close to patient working on spacing of hemiwalker   Ambulation 2 x 60 ft with "zig-zag" pattern working on orienting to space with hemiwalker, also cues for step to pattern of gait and sequencing.      PATIENT EDUCATION: Education details:Pt educated throughout session about proper posture and technique with exercises. Improved exercise technique, movement at target joints, use of target muscles after min to mod verbal, visual, tactile cues.  Person educated: Patient Education method: Explanation Education comprehension: verbalized understanding  HOME EXERCISE PROGRAM:  Access Code: [email or web address] URL: [email or web address] Date: 11/24/2022 Prepared by: Thresa Ross  Exercises - Alternating Step Taps with Counter Support  - 7 x weekly - 2 sets - 10 reps - Sit to Stand  - 1 x daily - 7 x weekly - 2 sets - 10 reps - Standing Tandem Balance with Counter Support  - 1 x daily - 3 x weekly - 2 sets - 2 reps - 10 hold - Standing Single Leg Stance with Counter Support  - 1 x daily - 3 x weekly - 2 sets - 2 reps - 5 hold - Seated Long Arc Quad with Ankle Weight  - 1 x daily - 7 x weekly - 2 sets - 10 reps  Previous: Pt performs leg lifts and squats with UE support    GOALS: Goals reviewed with patient? Yes  SHORT TERM GOALS: Target date: 06/24/2022    Patient will be independent in home exercise program to improve strength/mobility for better functional independence with ADLs. Baseline: No HEP currently 9/12: pt completing HEP regularly.  Goal status: MET  LONG TERM GOALS: Target date: 01/29/2023    1.  Patient (> 108 years old) will complete five times sit to stand test in < 15 seconds indicating an increased LE strength and improved balance. Baseline:  Test second visit ; 05/29/2022: 40 sec pull to stance RUE 07/10/22: 22.82 sec. 08/18/22: 16.36 sec with UE support 09/29/22:19.32 sec,  42 sec without UE assist  9/9:19.86 sec no UE assist  Goal status: ONGOING/ PROGRESSING  2.  Patient will increase FOTO score by 8 or more points  to demonstrate statistically significant improvement in mobility and quality of life.  Baseline: Test second visit; 05/28/2040: 45; 07/10/22: 41, 08/18/22: 45 8/5:48 Goal status: ONGOING  3.  Patient will increase 10 meter walk test to >.37m/s as to improve gait speed for better community ambulation and to reduce fall risk. Baseline: Test visit 2 06/19/22: 53 sec 5/16: 77 sec. . 8/5:46 sec with SPC, 33 sec with RW 9/12: 37.66 sec, 24 sec with 3WW Goal status: ONGOING   4. Patient will improve ability to ambulate on uneven surfaces as evidenced by ability to ambulate over simulated uneven terrain in clinic to improve independence with ambulation. 9/12: pt ambulates over red mat and blue mat without LOB, still not confident on red mat  Goal status: MET  5.  Pt will ascend and descend 4 stairs with unilateral UE assist and with step through pattern of gait and without any sign of imbalance to improve her community mobility.  Baseline: Pt has R lateral lean with ascending and descending  and has LLE internal rotation of foot when stepping down.  Goal status: INITIAL  6.  Pt will step on and off of curb or simulated curb using SPC in order to allow for improved community mobility.  Baseline: Unable, attempted 9/12 Goal status: INITIAL       ASSESSMENT:  CLINICAL IMPRESSION:   Pt presents with good motivation for completion of PT activities. Pt back from 1 month break form PT where she was dealing with the loss of her service dog.  Patient presents with hemiwalker and requests for training with this device in order to improve her mobility.  Patient provided with instruction as well as various practice scenarios with hemiwalker this date patient shows good stability with hemiwalker but does have slowed gait speed as would be expected with a more  stable assistive device.  Will continue to work on this in future sessions as patient would like to consider transitioning to this for her primary means of mobility outside of her standard wheelchair. Pt will continue to benefit from skilled physical therapy intervention to address impairments, improve QOL, and attain therapy goals.        OBJECTIVE IMPAIRMENTS: Abnormal gait, decreased activity tolerance, decreased balance, decreased endurance, decreased strength, decreased safety awareness, hypomobility, impaired flexibility, and impaired UE functional use.   ACTIVITY LIMITATIONS: lifting, bending, standing, stairs, transfers, bed mobility, and locomotion level  PARTICIPATION LIMITATIONS: meal prep, cleaning, shopping, and community activity  PERSONAL FACTORS: Time since onset of injury/illness/exacerbation and 3+ comorbidities: diabetes, HTN, Multiple previous strokes   are also affecting patient's functional outcome.   REHAB POTENTIAL: Fair secondary to time since onset and severely of visual deficits and L UE funcitonal use   CLINICAL DECISION MAKING: Stable/uncomplicated  EVALUATION COMPLEXITY: Low  PLAN:  PT FREQUENCY: 2x/week  PT DURATION: 12 weeks  PLANNED INTERVENTIONS: Therapeutic exercises, Therapeutic activity, Neuromuscular re-education, Balance training, Gait training, Patient/Family education, Self Care, Joint mobilization, Stair training, Moist heat, Manual therapy, and Re-evaluation  PLAN FOR NEXT SESSION:    Ambulation and other functional movement tasks while speaking ( not stopping ambulating when talking)  Working on curb navigation stepping on and off of curbs as well as stair navigation both ascending and descending. Ambulation with cane or 3WW on uneven surfaces ( inclines, declines, etc)  Improve confidence with getting in and out of a chair.    Norman Herrlich PT ,DPT Physical Therapist- [facility]   4:20  PM 01/05/23

## 2023-01-06 ENCOUNTER — Ambulatory Visit: Payer: PPO | Admitting: Dermatology

## 2023-01-06 DIAGNOSIS — L219 Seborrheic dermatitis, unspecified: Secondary | ICD-10-CM | POA: Diagnosis not present

## 2023-01-06 DIAGNOSIS — L821 Other seborrheic keratosis: Secondary | ICD-10-CM | POA: Diagnosis not present

## 2023-01-06 DIAGNOSIS — L820 Inflamed seborrheic keratosis: Secondary | ICD-10-CM | POA: Diagnosis not present

## 2023-01-06 DIAGNOSIS — L720 Epidermal cyst: Secondary | ICD-10-CM | POA: Diagnosis not present

## 2023-01-06 DIAGNOSIS — L304 Erythema intertrigo: Secondary | ICD-10-CM

## 2023-01-06 DIAGNOSIS — L719 Rosacea, unspecified: Secondary | ICD-10-CM

## 2023-01-06 NOTE — Progress Notes (Signed)
Follow-Up Visit   Subjective  [name] is a 72 y.o. female who presents for the following: Spots on the right forearm, right lower leg, and a pimple on the left lower lip that has been there for years.   The patient has spots, moles and lesions to be evaluated, some may be new or changing and the patient may have concern these could be cancer.     The following portions of the chart were reviewed this encounter and updated as appropriate: medications, allergies, medical history  Review of Systems:  No other skin or systemic complaints except as noted in HPI or Assessment and Plan.  Objective  Well appearing patient in no apparent distress; mood and affect are within normal limits.  A focused examination was performed of the following areas: Face, arms, right leg Relevant physical exam findings are noted in the Assessment and Plan.  Left Dorsal Hand Erythematous stuck-on, waxy papule or plaque    Assessment & Plan   SEBORRHEIC KERATOSIS - Stuck-on, waxy, tan-patch, darker central of the right forearm  - Right medial lower leg 1.8 cm waxy tan plaque with central clearing --Discussed benign etiology and prognosis.  Pt states was frozen in past, not bothersome. - Benign-appearing - Discussed benign etiology and prognosis. - Observe - Call for any changes - Discussed cryotherapy if becomes bothersome.   Milia - tiny firm white papule at left lower vermilion border - type of cyst - benign - sometimes these will clear with nightly OTC adapalene/Differin 0.1% gel or retinol. - may be extracted if symptomatic - observe  ROSACEA Exam Mid face erythema with telangiectasias   Chronic and persistent condition with duration or expected duration over one year. Condition is improving with treatment but not currently at goal.   Rosacea is a chronic progressive skin condition usually affecting the face of adults, causing redness and/or acne bumps. It is treatable but not  curable. It sometimes affects the eyes (ocular rosacea) as well. It may respond to topical and/or systemic medication and can flare with stress, sun exposure, alcohol, exercise, topical steroids (including hydrocortisone/cortisone 10) and some foods.  Daily application of broad spectrum spf 30+ sunscreen to face is recommended to reduce flares.  Patient denies grittiness of the eyes.  Treatment Plan Continue metronidazole 0.75% cream once a day to face, twice daily with flares.  SEBORRHEIC DERMATITIS Exam: Pink patches with greasy scale at brows, alar creases  Chronic and persistent condition with duration or expected duration over one year. Condition is bothersome/symptomatic for patient. Currently flared.  Seborrheic Dermatitis is a chronic persistent rash characterized by pinkness and scaling most commonly of the mid face but also can occur on the scalp (dandruff), ears; mid chest, mid back and groin.  It tends to be exacerbated by stress and cooler weather.  People who have neurologic disease may experience new onset or exacerbation of existing seborrheic dermatitis.  The condition is not curable but treatable and can be controlled.  Treatment Plan: Ketoconazole 2% cream once to twice daily for flares.   INTERTRIGO Exam Clear today at body folds per pt  Chronic condition with duration or expected duration over one year. Currently well-controlled.   Intertrigo is a chronic recurrent rash that occurs in skin fold areas that may be associated with friction; heat; moisture; yeast; fungus; and bacteria.  It is exacerbated by increased movement / activity; sweating; and higher atmospheric temperature.  Treatment Plan Continue ketoconazole 2% cream once to twice daily as needed.  Inflamed seborrheic keratosis Left Dorsal Hand  Symptomatic, irritating, patient would like treated.  Destruction of lesion - Left Dorsal Hand  Destruction method: cryotherapy   Informed consent:  discussed and consent obtained   Lesion destroyed using liquid nitrogen: Yes   Region frozen until ice ball extended beyond lesion: Yes   Outcome: patient tolerated procedure well with no complications   Post-procedure details: wound care instructions given   Additional details:  Prior to procedure, discussed risks of blister formation, small wound, skin dyspigmentation, or rare scar following cryotherapy. Recommend Vaseline ointment to treated areas while healing.    Return in about 1 year (around 01/06/2024) for TBSE.  ICherlyn Labella, CMA, am acting as scribe for Willeen Niece, MD .   Documentation: I have reviewed the above documentation for accuracy and completeness, and I agree with the above.  Willeen Niece, MD

## 2023-01-06 NOTE — Patient Instructions (Addendum)
Cryotherapy Aftercare  Wash gently with soap and water everyday.   Apply Vaseline or Mupirocin ointment and Band-Aid daily until healed.   Ketoconazole 2% cream - continue to skin folds as needed for rash. May use on pink, scaly areas on face once to twice daily.    Effaclar 0.1% Gel - Apply to bump on lip nightly as tolerated.  Due to recent changes in healthcare laws, you may see results of your pathology and/or laboratory studies on MyChart before the doctors have had a chance to review them. We understand that in some cases there may be results that are confusing or concerning to you. Please understand that not all results are received at the same time and often the doctors may need to interpret multiple results in order to provide you with the best plan of care or course of treatment. Therefore, we ask that you please give [place] 2 business days to thoroughly review all your results before contacting the office for clarification. Should we see a critical lab result, you will be contacted sooner.   If You Need Anything After Your Visit  If you have any questions or concerns for your doctor, please call our main line at [phone] and press option 4 to reach your doctor's medical assistant. If no one answers, please leave a voicemail as directed and we will return your call as soon as possible. Messages left after 4 pm will be answered the following business day.   You may also send [place] a message via MyChart. We typically respond to MyChart messages within 1-2 business days.  For prescription refills, please ask your pharmacy to contact our office. Our fax number is [phone].  If you have an urgent issue when the clinic is closed that cannot wait until the next business day, you can page your doctor at the number below.    Please note that while we do our best to be available for urgent issues outside of office hours, we are not available 24/7.   If you have an urgent issue and are  unable to reach [place], you may choose to seek medical care at your doctor's office, retail clinic, urgent care center, or emergency room.  If you have a medical emergency, please immediately call 911 or go to the emergency department.  Pager Numbers  - Dr. Gwen Pounds: [phone]  - Dr. Roseanne Reno: [phone]  - Dr. Katrinka Blazing: [phone]   In the event of inclement weather, please call our main line at [phone] for an update on the status of any delays or closures.  Dermatology Medication Tips: Please keep the boxes that topical medications come in in order to help keep track of the instructions about where and how to use these. Pharmacies typically print the medication instructions only on the boxes and not directly on the medication tubes.   If your medication is too expensive, please contact our office at [phone] option 4 or send [place] a message through MyChart.   We are unable to tell what your co-pay for medications will be in advance as this is different depending on your insurance coverage. However, we may be able to find a substitute medication at lower cost or fill out paperwork to get insurance to cover a needed medication.   If a prior authorization is required to get your medication covered by your insurance company, please allow [place] 1-2 business days to complete this process.  Drug prices often vary depending on where the prescription is filled and some pharmacies  may offer cheaper prices.  The website www.goodrx.com contains coupons for medications through different pharmacies. The prices here do not account for what the cost may be with help from insurance (it may be cheaper with your insurance), but the website can give you the price if you did not use any insurance.  - You can print the associated coupon and take it with your prescription to the pharmacy.  - You may also stop by our office during regular business hours and pick up a GoodRx coupon card.  - If you need your  prescription sent electronically to a different pharmacy, notify our office through [facility] or by phone at [phone] option 4.     Si Usted Necesita Algo Despus de Su Visita  Tambin puede enviarnos un mensaje a travs de Clinical cytogeneticist. Por lo general respondemos a los mensajes de MyChart en el transcurso de 1 a 2 das hbiles.  Para renovar recetas, por favor pida a su farmacia que se ponga en contacto con nuestra oficina. Annie Sable de fax es [place] [phone].  Si tiene un asunto urgente cuando la clnica est cerrada y que no puede esperar hasta el siguiente da hbil, puede llamar/localizar a su doctor(a) al nmero que aparece a continuacin.   Por favor, tenga en cuenta que aunque hacemos todo lo posible para estar disponibles para asuntos urgentes fuera del horario de [place], no estamos disponibles las 24 horas del da, los 7 [address]  [address] de la [place].   Si tiene un problema urgente y no puede comunicarse con nosotros, puede optar por buscar atencin mdica  en el consultorio de su doctor(a), en una clnica privada, en un centro de atencin urgente o en una sala de emergencias.  Si tiene Engineer, drilling, por favor llame inmediatamente al 911 o vaya a la sala de emergencias.  Nmeros de bper  - Dr. Gwen Pounds: [phone]  - Dra. Roseanne Reno: [phone]  - Dr. Katrinka Blazing: [phone]   En caso de inclemencias del tiempo, por favor llame a Lacy Duverney principal al [phone] para una actualizacin sobre el [place] de cualquier retraso o cierre.  Consejos para la medicacin en dermatologa: Por favor, guarde las cajas en las que vienen los medicamentos de uso tpico para ayudarle a seguir las instrucciones sobre dnde y cmo usarlos. Las farmacias generalmente imprimen las instrucciones del medicamento slo en las cajas y no directamente en los tubos del [place].   Si su medicamento es muy caro, por favor, pngase en contacto con Rolm Gala llamando al  [phone] y presione la opcin 4 o envenos un mensaje a travs de Clinical cytogeneticist.   No podemos decirle cul ser su copago por los medicamentos por adelantado ya que esto es diferente dependiendo de la cobertura de su seguro. Sin embargo, es posible que podamos encontrar un medicamento sustituto a Audiological scientist un formulario para que el seguro cubra el medicamento que se considera necesario.   Si se requiere una autorizacin previa para que su compaa de seguros [place] su medicamento, por favor permtanos de 1 a 2 das hbiles para completar [address]  Los precios de los medicamentos varan con frecuencia dependiendo del Environmental consultant de dnde se surte la receta y alguna farmacias pueden ofrecer precios ms baratos.  El sitio web www.goodrx.com tiene cupones para medicamentos de Health and safety inspector. Los precios aqu no tienen en cuenta lo que podra costar con la ayuda del seguro (puede ser ms barato con su seguro), pero el sitio web puede darle el precio si  no utiliz [employer].  - Puede imprimir el cupn correspondiente y llevarlo con su receta a la farmacia.  - Tambin puede pasar por nuestra oficina durante el horario de atencin regular y Education officer, museum una tarjeta de cupones de GoodRx.  - Si necesita que su receta se enve electrnicamente a una farmacia diferente, informe a nuestra oficina a travs de MyChart de [place] o por telfono llamando al [phone] y presione la opcin 4.

## 2023-01-07 ENCOUNTER — Ambulatory Visit: Payer: PPO | Admitting: Occupational Therapy

## 2023-01-07 ENCOUNTER — Ambulatory Visit: Payer: PPO | Admitting: Physical Therapy

## 2023-01-07 DIAGNOSIS — R2681 Unsteadiness on feet: Secondary | ICD-10-CM

## 2023-01-07 DIAGNOSIS — M6281 Muscle weakness (generalized): Secondary | ICD-10-CM

## 2023-01-07 DIAGNOSIS — R269 Unspecified abnormalities of gait and mobility: Secondary | ICD-10-CM

## 2023-01-07 DIAGNOSIS — R2689 Other abnormalities of gait and mobility: Secondary | ICD-10-CM

## 2023-01-07 DIAGNOSIS — R262 Difficulty in walking, not elsewhere classified: Secondary | ICD-10-CM

## 2023-01-07 NOTE — Therapy (Unsigned)
OUTPATIENT PHYSICAL THERAPY NEURO TREATMENT      Patient Name: [name] MRN: [id number] DOB:1950/09/30, 72 y.o., female Today's Date: 01/07/2023   PCP:  Enid Baas, MD   REFERRING PROVIDER: Morene Crocker, MD   END OF SESSION:  PT End of Session - 01/07/23 1617     Visit Number 45    Number of Visits 64    Date for PT Re-Evaluation 01/29/23    Authorization Type [employer]    Progress Note Due on Visit 50    PT Start Time 1618    PT Stop Time 1658    PT Time Calculation (min) 40 min    Equipment Utilized During Treatment Gait belt    Activity Tolerance Patient tolerated treatment well;No increased pain    Behavior During Therapy [facility] for tasks assessed/performed                                   Past Medical History:  Diagnosis Date   Abdominal pain 12/06/2013   Abnormality of gait 01/26/2013   Altered bowel habits 12/11/2017   Bruises easily    Constipation    Crohn's disease (HCC)    Depression    Dizziness and giddiness 01/26/2013   Fecal incontinence alternating with constipation 12/11/2017   GERD (gastroesophageal reflux disease)    Hemianopia    left   Hemiparesis and alteration of sensations as late effects of stroke (HCC) 01/26/2013   Hyperlipidemia    Hypertension    Hypothyroidism    Lump in female breast    Panic disorder    PONV (postoperative nausea and vomiting)    Stroke (HCC) 2009   Tremor, essential 04/24/2020   Type II diabetes mellitus (HCC)    Past Surgical History:  Procedure Laterality Date   ANAL FISSURE REPAIR  2008   APPENDECTOMY  1986   BREAST BIOPSY Left 2006   neg   BREAST LUMPECTOMY Right 1990's   CESAREAN SECTION  1981; 1983   CHOLECYSTECTOMY  1986   SMALL INTESTINE SURGERY  05/1991   TUBAL LIGATION  1983   Patient Active Problem List   Diagnosis Date Noted   Bone spur 06/13/2021   Acute CVA (cerebrovascular accident) (HCC) 03/07/2021   CVA (cerebral vascular  accident) (HCC) 11/01/2020   Chronic pain 04/25/2020   Chronic constipation 04/25/2020   Gastro-esophageal reflux disease without esophagitis 04/25/2020   Hardening of the aorta (main artery of the heart) (HCC) 04/25/2020   History of iron deficiency anemia 04/25/2020   Hypercholesteremia 04/25/2020   Hypertension 04/25/2020   Personal history of transient ischemic attack (TIA), and cerebral infarction without residual deficits 04/25/2020   Pulmonary emphysema (HCC) 04/25/2020   Vitamin D deficiency 04/25/2020   Tremor, essential 04/24/2020   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  Rationale for Evaluation and Treatment: Rehabilitation  SUBJECTIVE:  SUBJECTIVE STATEMENT:   Patient reports no significant changes since previous session.  Pt accompanied by: self  PERTINENT HISTORY: Pt has had one major stroke and 5 mini strokes since 2009. Pt is here since her doctor wants her to work on her strength and balance and she also wants to improve in these categories. Pt has hemianopsia with blindness inferior, left side and superior. Pt initially had stroke in 2009 and was previously able to walk for long distances. Pt has 650 feet driveway that is gravel and used to walk up and down with the cane.  Pt has issues with depth perception. Pt is unable to tell angles of concrete and holes in the ground. Pt has service dogs that are retired, one for guidance and one for helping with picking up and retrieving items. Pt had most recent stroke in January 2023 that further limited her field of vision.  Patient wants to work diligently on her strength, balance, mobility, and in order to improve her ability to ambulate on various surfaces in various scenarios.  Patient wanted to come to outpatient services to utilize equipment to improve her strength and a greater capacity than home health was able to assist her with.  She is doing home exercise program involving mostly standing with upper extremity support and activities to improve her strength.  Patient reports she is very nervous to ambulate without contact-guard assist and gait belt without being able to hold onto a wall or steady surface in order to provide her with tactile cues for the areas around her.  PAIN:  Are you having pain? None  PRECAUTIONS: Fall, hemianopsia  WEIGHT BEARING RESTRICTIONS: No  FALLS: Has patient fallen in last 6 months? No  PATIENT GOALS: Improve gait, improve strength, walk on unsteady surfaces  OBJECTIVE:   TODAY'S TREATMENT:                                                                                                                              DATE: 01/07/23   Unless otherwise stated, CGA was provided and gait belt donned in order to ensure pt safety  TE   Ambulation with new hemiwalker around various obstacles x 75 ft -Consistent step to pattern of gait and cues for continuation of forward momentum and walking with speaking.  Also cues for step through pattern of gait when able in order to improve ambulation and efficiency. STS x 10 reps from her WC  -Upper extremity support on hemiwalker for practice with sit to stands with this.  Patient requires multiple "rocks" in order to come to standing but is  showing improvement with this  NMR Staggered stance practice with left lower extremity posterior and right lower extremity anterior  -in order to improve patient's stability with phase of gait where she has double leg stance and moves hemiwalker forward.  Initially very unsteady but with increased reps patient did not improve.  Times many repetitions for about 4 minutes  Ambulation x 75 feet with same above parameters working on step through pattern of gait and working on balance with this phase.  Overall improved ability to complete this.     PATIENT EDUCATION: Education details:Pt educated throughout session about proper posture and technique with exercises. Improved exercise technique, movement at target joints, use of target muscles after min to mod verbal, visual, tactile cues.  Person educated: Patient Education method: Explanation Education comprehension: verbalized understanding  HOME EXERCISE PROGRAM:  Access Code: [email or web address] URL: [email or web address] Date: 11/24/2022 Prepared by: Thresa Ross  Exercises - Alternating Step Taps with Counter Support  - 7 x weekly - 2 sets - 10 reps - Sit to Stand  - 1 x daily - 7 x weekly - 2 sets - 10 reps - Standing Tandem Balance with Counter Support  - 1 x daily - 3 x weekly - 2 sets - 2 reps - 10 hold - Standing Single Leg Stance with Counter Support  - 1 x daily - 3 x weekly - 2 sets - 2 reps - 5 hold - Seated Long Arc Quad with Ankle Weight  - 1 x daily - 7 x weekly - 2 sets - 10 reps  Previous: Pt performs leg lifts and squats with UE support    GOALS: Goals reviewed with patient? Yes  SHORT TERM GOALS: Target date: 06/24/2022    Patient will be independent in home exercise program to improve strength/mobility for better functional independence with ADLs. Baseline: No HEP currently 9/12: pt completing HEP regularly.  Goal status: MET  LONG TERM GOALS: Target date: 01/29/2023    1.  Patient (> 39 years  old) will complete five times sit to stand test in < 15 seconds indicating an increased LE strength and improved balance. Baseline:  Test second visit ; 05/29/2022: 40 sec pull to stance RUE 07/10/22: 22.82 sec. 08/18/22: 16.36 sec with UE support 09/29/22:19.32 sec, 42 sec without UE assist  9/9:19.86 sec no UE assist  Goal status: ONGOING/ PROGRESSING  2.  Patient will increase FOTO score by 8 or more points  to demonstrate statistically significant improvement in mobility and quality of life.  Baseline: Test second visit; 05/28/2040: 45; 07/10/22: 41, 08/18/22: 45 8/5:48 Goal status: ONGOING  3.  Patient will increase 10 meter walk test to >.88m/s as to improve gait speed for better community ambulation and to reduce fall risk. Baseline: Test visit 2 06/19/22: 53 sec 5/16: 77 sec. . 8/5:46 sec with SPC, 33 sec with RW 9/12: 37.66 sec, 24 sec with 3WW Goal status: ONGOING   4. Patient will improve ability to ambulate on uneven surfaces as evidenced by ability to ambulate over simulated uneven terrain in clinic to improve independence with ambulation. 9/12: pt ambulates over red mat and blue mat without LOB, still not confident on red mat  Goal status: MET  5.  Pt will ascend and descend 4 stairs with unilateral UE assist and with step through pattern of gait and without any sign of imbalance to improve her community mobility.  Baseline: Pt has R lateral lean with ascending and descending and has LLE internal rotation of foot when stepping down.  Goal status: INITIAL  6.  Pt will step on and off of curb or simulated curb using SPC in order to allow for improved community mobility.  Baseline: Unable, attempted 9/12 Goal status: INITIAL       ASSESSMENT:  CLINICAL IMPRESSION:   Pt presents with good motivation for completion of PT activities.  Patient brings hemiwalker to therapy session again for continued practice with this.  Most of session focused on improving ambulation safety as well as  efficiency with this device and working on improved staggered stance balance as well as step through pattern of gait.  At times patient shows improved efficiency but is inconsistent with it and will continue to benefit from continued practice.Pt will continue to benefit from skilled physical therapy intervention to address impairments, improve QOL, and attain therapy goals.         OBJECTIVE IMPAIRMENTS: Abnormal gait, decreased activity tolerance, decreased balance, decreased endurance, decreased strength, decreased safety awareness, hypomobility, impaired flexibility, and impaired UE functional use.   ACTIVITY LIMITATIONS: lifting, bending, standing, stairs, transfers, bed mobility, and locomotion level  PARTICIPATION LIMITATIONS: meal prep, cleaning, shopping, and community activity  PERSONAL FACTORS: Time since onset of injury/illness/exacerbation and 3+ comorbidities: diabetes, HTN, Multiple previous strokes   are also affecting patient's functional outcome.   REHAB POTENTIAL: Fair secondary to time since onset and severely of visual deficits and L UE funcitonal use   CLINICAL DECISION MAKING: Stable/uncomplicated  EVALUATION COMPLEXITY: Low  PLAN:  PT FREQUENCY: 2x/week  PT DURATION: 12 weeks  PLANNED INTERVENTIONS: Therapeutic exercises, Therapeutic activity, Neuromuscular re-education, Balance training, Gait training, Patient/Family education, Self Care, Joint mobilization, Stair training, Moist heat, Manual therapy, and Re-evaluation  PLAN FOR NEXT SESSION:    Ambulation and other functional movement tasks while speaking ( not stopping ambulating when talking)  Working on curb navigation stepping on and off of curbs as well as stair navigation both ascending and descending. Ambulation with cane or 3WW on uneven surfaces ( inclines, declines, etc)  Improve confidence with getting in and out of a chair.    Norman Herrlich PT ,DPT Physical Therapist- [place]   [facility]   4:17 PM 01/07/23

## 2023-01-09 DIAGNOSIS — Z8673 Personal history of transient ischemic attack (TIA), and cerebral infarction without residual deficits: Secondary | ICD-10-CM | POA: Diagnosis not present

## 2023-01-09 DIAGNOSIS — R262 Difficulty in walking, not elsewhere classified: Secondary | ICD-10-CM | POA: Diagnosis not present

## 2023-01-12 ENCOUNTER — Ambulatory Visit: Payer: PPO | Admitting: Physical Therapy

## 2023-01-12 ENCOUNTER — Ambulatory Visit: Payer: PPO

## 2023-01-12 DIAGNOSIS — R262 Difficulty in walking, not elsewhere classified: Secondary | ICD-10-CM

## 2023-01-12 DIAGNOSIS — M6281 Muscle weakness (generalized): Secondary | ICD-10-CM

## 2023-01-12 DIAGNOSIS — R2681 Unsteadiness on feet: Secondary | ICD-10-CM

## 2023-01-12 DIAGNOSIS — R269 Unspecified abnormalities of gait and mobility: Secondary | ICD-10-CM

## 2023-01-12 DIAGNOSIS — R2689 Other abnormalities of gait and mobility: Secondary | ICD-10-CM

## 2023-01-12 NOTE — Therapy (Signed)
OUTPATIENT PHYSICAL THERAPY NEURO TREATMENT      Patient Name: [name] MRN: [id number] DOB:10-Mar-1950, 72 y.o., female Today's Date: 01/12/2023   PCP:  Enid Baas, MD   REFERRING PROVIDER: Morene Crocker, MD   END OF SESSION:  PT End of Session - 01/12/23 1520     Visit Number 46    Number of Visits 64    Date for PT Re-Evaluation 01/29/23    Authorization Type [employer]    Progress Note Due on Visit 50    PT Start Time 1531    PT Stop Time 1613    PT Time Calculation (min) 42 min    Equipment Utilized During Treatment Gait belt    Activity Tolerance Patient tolerated treatment well;No increased pain    Behavior During Therapy [facility] for tasks assessed/performed                                   Past Medical History:  Diagnosis Date   Abdominal pain 12/06/2013   Abnormality of gait 01/26/2013   Altered bowel habits 12/11/2017   Bruises easily    Constipation    Crohn's disease (HCC)    Depression    Dizziness and giddiness 01/26/2013   Fecal incontinence alternating with constipation 12/11/2017   GERD (gastroesophageal reflux disease)    Hemianopia    left   Hemiparesis and alteration of sensations as late effects of stroke (HCC) 01/26/2013   Hyperlipidemia    Hypertension    Hypothyroidism    Lump in female breast    Panic disorder    PONV (postoperative nausea and vomiting)    Stroke (HCC) 2009   Tremor, essential 04/24/2020   Type II diabetes mellitus (HCC)    Past Surgical History:  Procedure Laterality Date   ANAL FISSURE REPAIR  2008   APPENDECTOMY  1986   BREAST BIOPSY Left 2006   neg   BREAST LUMPECTOMY Right 1990's   CESAREAN SECTION  1981; 1983   CHOLECYSTECTOMY  1986   SMALL INTESTINE SURGERY  05/1991   TUBAL LIGATION  1983   Patient Active Problem List   Diagnosis Date Noted   Bone spur 06/13/2021   Acute CVA (cerebrovascular accident) (HCC) 03/07/2021   CVA (cerebral vascular  accident) (HCC) 11/01/2020   Chronic pain 04/25/2020   Chronic constipation 04/25/2020   Gastro-esophageal reflux disease without esophagitis 04/25/2020   Hardening of the aorta (main artery of the heart) (HCC) 04/25/2020   History of iron deficiency anemia 04/25/2020   Hypercholesteremia 04/25/2020   Hypertension 04/25/2020   Personal history of transient ischemic attack (TIA), and cerebral infarction without residual deficits 04/25/2020   Pulmonary emphysema (HCC) 04/25/2020   Vitamin D deficiency 04/25/2020   Tremor, essential 04/24/2020   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   Right lower quadrant abdominal pain 08/18/2018   Spastic hemiplegia affecting nondominant side (HCC) 02/03/2014   Diabetes mellitus (HCC) 09/07/2013   Hypothyroidism 09/07/2013   Type 2 diabetes mellitus without complications (HCC) 09/07/2013   Hemiparesis and alteration of sensations as late effects of stroke (HCC) 01/26/2013   Unsteady gait 01/26/2013   Dizziness and giddiness 01/26/2013    ONSET DATE: 2009  REFERRING DIAG: Z86.73 (ICD-10-CM) - History of stroke   THERAPY DIAG:  Muscle weakness (generalized)  Difficulty in walking, not elsewhere classified  Other abnormalities of gait and mobility  Unsteadiness on feet  Abnormality of gait and  mobility  Rationale for Evaluation and Treatment: Rehabilitation  SUBJECTIVE:                                                                                                                                                                                             SUBJECTIVE STATEMENT:   Patient reports no significant changes since previous session.  Pt accompanied by: self  PERTINENT HISTORY: Pt has had one major stroke and 5 mini strokes since 2009. Pt is here since her doctor wants her to work on her strength and balance and she also wants to improve in these categories. Pt has hemianopsia with blindness inferior, left side and superior. Pt  initially had stroke in 2009 and was previously able to walk for long distances. Pt has 650 feet driveway that is gravel and used to walk up and down with the cane. Pt has issues with depth perception. Pt is unable to tell angles of concrete and holes in the ground. Pt has service dogs that are retired, one for guidance and one for helping with picking up and retrieving items. Pt had most recent stroke in January 2023 that further limited her field of vision.  Patient wants to work diligently on her strength, balance, mobility, and in order to improve her ability to ambulate on various surfaces in various scenarios.  Patient wanted to come to outpatient services to utilize equipment to improve her strength and a greater capacity than home health was able to assist her with.  She is doing home exercise program involving mostly standing with upper extremity support and activities to improve her strength.  Patient reports she is very nervous to ambulate without contact-guard assist and gait belt without being able to hold onto a wall or steady surface in order to provide her with tactile cues for the areas around her.  PAIN:  Are you having pain? None  PRECAUTIONS: Fall, hemianopsia  WEIGHT BEARING RESTRICTIONS: No  FALLS: Has patient fallen in last 6 months? No  PATIENT GOALS: Improve gait, improve strength, walk on unsteady surfaces  OBJECTIVE:   TODAY'S TREATMENT:  DATE: 01/12/23   Unless otherwise stated, CGA was provided and gait belt donned in order to ensure pt safety  TA  Patient transported wheelchair to healing garden where patient walks on pavement with very slight variable inclinations. -Ambulation x 40 feet with cues for step through pattern of gait with the right lower extremity as well as for proper sequencing with hemiwalker to improve patient's efficiency  with gait.  Physical therapist also explains why efficiency with gait is optimal for walking.  Anterior and posterior slides in  outdoor chair working on improving patient's efficiency with getting to edge of chair for efficient sit to stand transitions.  X 10 repetitions  Ambulation x 40 feet with same challenges and cues from earlier ambulatory bout.  Improved efficiency with step through pattern of gait for latter half of this ambulatory bout.  TE  STS x 10 reps -Upper extremity support on hemiwalker for practice with sit to stands with this.  Patient challenged to use 1 "rock on second set of 5 repetitions and 2 "rocks on first set of 5 repetitions for total of 10 reps.  Patient efficiency with sit to stand did improve.  In wheelchair x 20 feet of hamstring curls using bilateral hamstrings to propel wheelchair forward.  Followed by 20 feet of utilizing left lower extremity only for propulsion in wheelchair, this was a lot more time-consuming compared to the other but was beneficial for improving patient's ability to transition forward in the wheelchair and for hamstring activation     PATIENT EDUCATION: Education details:Pt educated throughout session about proper posture and technique with exercises. Improved exercise technique, movement at target joints, use of target muscles after min to mod verbal, visual, tactile cues.  Person educated: Patient Education method: Explanation Education comprehension: verbalized understanding  HOME EXERCISE PROGRAM:  Access Code: [email or web address] URL: [email or web address] Date: 11/24/2022 Prepared by: Thresa Ross  Exercises - Alternating Step Taps with Counter Support  - 7 x weekly - 2 sets - 10 reps - Sit to Stand  - 1 x daily - 7 x weekly - 2 sets - 10 reps - Standing Tandem Balance with Counter Support  - 1 x daily - 3 x weekly - 2 sets - 2 reps - 10 hold - Standing Single Leg Stance with Counter Support  - 1 x daily - 3 x  weekly - 2 sets - 2 reps - 5 hold - Seated Long Arc Quad with Ankle Weight  - 1 x daily - 7 x weekly - 2 sets - 10 reps  Previous: Pt performs leg lifts and squats with UE support    GOALS: Goals reviewed with patient? Yes  SHORT TERM GOALS: Target date: 06/24/2022    Patient will be independent in home exercise program to improve strength/mobility for better functional independence with ADLs. Baseline: No HEP currently 9/12: pt completing HEP regularly.  Goal status: MET  LONG TERM GOALS: Target date: 01/29/2023    1.  Patient (> 79 years old) will complete five times sit to stand test in < 15 seconds indicating an increased LE strength and improved balance. Baseline:  Test second visit ; 05/29/2022: 40 sec pull to stance RUE 07/10/22: 22.82 sec. 08/18/22: 16.36 sec with UE support 09/29/22:19.32 sec, 42 sec without UE assist  9/9:19.86 sec no UE assist  Goal status: ONGOING/ PROGRESSING  2.  Patient will increase FOTO score by 8 or more points  to demonstrate statistically significant improvement in mobility and quality of life.  Baseline:  Test second visit; 05/28/2040: 45; 07/10/22: 41, 08/18/22: 45 8/5:48 Goal status: ONGOING  3.  Patient will increase 10 meter walk test to >.64m/s as to improve gait speed for better community ambulation and to reduce fall risk. Baseline: Test visit 2 06/19/22: 53 sec 5/16: 77 sec. . 8/5:46 sec with SPC, 33 sec with RW 9/12: 37.66 sec, 24 sec with 3WW Goal status: ONGOING   4. Patient will improve ability to ambulate on uneven surfaces as evidenced by ability to ambulate over simulated uneven terrain in clinic to improve independence with ambulation. 9/12: pt ambulates over red mat and blue mat without LOB, still not confident on red mat  Goal status: MET  5.  Pt will ascend and descend 4 stairs with unilateral UE assist and with step through pattern of gait and without any sign of imbalance to improve her community mobility.  Baseline: Pt has R lateral  lean with ascending and descending and has LLE internal rotation of foot when stepping down.  Goal status: INITIAL  6.  Pt will step on and off of curb or simulated curb using SPC in order to allow for improved community mobility.  Baseline: Unable, attempted 9/12 Goal status: INITIAL       ASSESSMENT:  CLINICAL IMPRESSION:   Pt presents with good motivation for completion of PT activities.  Patient brings hemiwalker to therapy session again for continued practice with this.  Most of session focused on improving ambulation safety as well as efficiency with this device and working on improved staggered stance balance as well as step through pattern of gait.  Also continuing to practice with sit to stand transitions and improving patient's strength and form with completing this as well as improving patient's ability to get into the proper posture for completing this with greater ease.Pt will continue to benefit from skilled physical therapy intervention to address impairments, improve QOL, and attain therapy goals.         OBJECTIVE IMPAIRMENTS: Abnormal gait, decreased activity tolerance, decreased balance, decreased endurance, decreased strength, decreased safety awareness, hypomobility, impaired flexibility, and impaired UE functional use.   ACTIVITY LIMITATIONS: lifting, bending, standing, stairs, transfers, bed mobility, and locomotion level  PARTICIPATION LIMITATIONS: meal prep, cleaning, shopping, and community activity  PERSONAL FACTORS: Time since onset of injury/illness/exacerbation and 3+ comorbidities: diabetes, HTN, Multiple previous strokes   are also affecting patient's functional outcome.   REHAB POTENTIAL: Fair secondary to time since onset and severely of visual deficits and L UE funcitonal use   CLINICAL DECISION MAKING: Stable/uncomplicated  EVALUATION COMPLEXITY: Low  PLAN:  PT FREQUENCY: 2x/week  PT DURATION: 12 weeks  PLANNED INTERVENTIONS:  Therapeutic exercises, Therapeutic activity, Neuromuscular re-education, Balance training, Gait training, Patient/Family education, Self Care, Joint mobilization, Stair training, Moist heat, Manual therapy, and Re-evaluation  PLAN FOR NEXT SESSION:    Ambulation and other functional movement tasks while speaking ( not stopping ambulating when talking)  Working on curb navigation stepping on and off of curbs as well as stair navigation both ascending and descending. Ambulation with cane or 3WW on uneven surfaces ( inclines, declines, etc)  Improve confidence with getting in and out of a chair.    Norman Herrlich PT ,DPT Physical Therapist- [place]  [facility]   3:21 PM 01/12/23

## 2023-01-14 ENCOUNTER — Encounter: Payer: Self-pay | Admitting: Physical Therapy

## 2023-01-14 ENCOUNTER — Ambulatory Visit: Payer: PPO | Admitting: Occupational Therapy

## 2023-01-14 ENCOUNTER — Ambulatory Visit: Payer: PPO | Admitting: Physical Therapy

## 2023-01-14 DIAGNOSIS — R269 Unspecified abnormalities of gait and mobility: Secondary | ICD-10-CM

## 2023-01-14 DIAGNOSIS — M6281 Muscle weakness (generalized): Secondary | ICD-10-CM

## 2023-01-14 DIAGNOSIS — R262 Difficulty in walking, not elsewhere classified: Secondary | ICD-10-CM

## 2023-01-14 DIAGNOSIS — R2681 Unsteadiness on feet: Secondary | ICD-10-CM

## 2023-01-14 DIAGNOSIS — R2689 Other abnormalities of gait and mobility: Secondary | ICD-10-CM

## 2023-01-14 NOTE — Therapy (Unsigned)
OUTPATIENT PHYSICAL THERAPY NEURO TREATMENT      Patient Name: [name] MRN: [id number] DOB:01/29/1951, 72 y.o., female Today's Date: 01/14/2023   PCP:  Enid Baas, MD   REFERRING PROVIDER: Morene Crocker, MD   END OF SESSION:  PT End of Session - 01/14/23 1523     Visit Number 47    Number of Visits 64    Date for PT Re-Evaluation 01/29/23    Authorization Type [employer]    Progress Note Due on Visit 50    PT Start Time 1530    PT Stop Time 1614    PT Time Calculation (min) 44 min    Equipment Utilized During Treatment Gait belt    Activity Tolerance Patient tolerated treatment well;No increased pain    Behavior During Therapy [facility] for tasks assessed/performed                                   Past Medical History:  Diagnosis Date   Abdominal pain 12/06/2013   Abnormality of gait 01/26/2013   Altered bowel habits 12/11/2017   Bruises easily    Constipation    Crohn's disease (HCC)    Depression    Dizziness and giddiness 01/26/2013   Fecal incontinence alternating with constipation 12/11/2017   GERD (gastroesophageal reflux disease)    Hemianopia    left   Hemiparesis and alteration of sensations as late effects of stroke (HCC) 01/26/2013   Hyperlipidemia    Hypertension    Hypothyroidism    Lump in female breast    Panic disorder    PONV (postoperative nausea and vomiting)    Stroke (HCC) 2009   Tremor, essential 04/24/2020   Type II diabetes mellitus (HCC)    Past Surgical History:  Procedure Laterality Date   ANAL FISSURE REPAIR  2008   APPENDECTOMY  1986   BREAST BIOPSY Left 2006   neg   BREAST LUMPECTOMY Right 1990's   CESAREAN SECTION  1981; 1983   CHOLECYSTECTOMY  1986   SMALL INTESTINE SURGERY  05/1991   TUBAL LIGATION  1983   Patient Active Problem List   Diagnosis Date Noted   Bone spur 06/13/2021   Acute CVA (cerebrovascular accident) (HCC) 03/07/2021   CVA (cerebral vascular  accident) (HCC) 11/01/2020   Chronic pain 04/25/2020   Chronic constipation 04/25/2020   Gastro-esophageal reflux disease without esophagitis 04/25/2020   Hardening of the aorta (main artery of the heart) (HCC) 04/25/2020   History of iron deficiency anemia 04/25/2020   Hypercholesteremia 04/25/2020   Hypertension 04/25/2020   Personal history of transient ischemic attack (TIA), and cerebral infarction without residual deficits 04/25/2020   Pulmonary emphysema (HCC) 04/25/2020   Vitamin D deficiency 04/25/2020   Tremor, essential 04/24/2020   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   Right lower quadrant abdominal pain 08/18/2018   Spastic hemiplegia affecting nondominant side (HCC) 02/03/2014   Diabetes mellitus (HCC) 09/07/2013   Hypothyroidism 09/07/2013   Type 2 diabetes mellitus without complications (HCC) 09/07/2013   Hemiparesis and alteration of sensations as late effects of stroke (HCC) 01/26/2013   Unsteady gait 01/26/2013   Dizziness and giddiness 01/26/2013    ONSET DATE: 2009  REFERRING DIAG: Z86.73 (ICD-10-CM) - History of stroke   THERAPY DIAG:  Muscle weakness (generalized)  Difficulty in walking, not elsewhere classified  Other abnormalities of gait and mobility  Unsteadiness on feet  Abnormality of gait and  mobility  Rationale for Evaluation and Treatment: Rehabilitation  SUBJECTIVE:                                                                                                                                                                                             SUBJECTIVE STATEMENT:   Patient reports no significant changes or falls since previous session. Pt reported 0/10 on NPS.   Pt accompanied by: self  PERTINENT HISTORY: Pt has had one major stroke and 5 mini strokes since 2009. Pt is here since her doctor wants her to work on her strength and balance and she also wants to improve in these categories. Pt has hemianopsia with blindness  inferior, left side and superior. Pt initially had stroke in 2009 and was previously able to walk for long distances. Pt has 650 feet driveway that is gravel and used to walk up and down with the cane. Pt has issues with depth perception. Pt is unable to tell angles of concrete and holes in the ground. Pt has service dogs that are retired, one for guidance and one for helping with picking up and retrieving items. Pt had most recent stroke in January 2023 that further limited her field of vision.  Patient wants to work diligently on her strength, balance, mobility, and in order to improve her ability to ambulate on various surfaces in various scenarios.  Patient wanted to come to outpatient services to utilize equipment to improve her strength and a greater capacity than home health was able to assist her with.  She is doing home exercise program involving mostly standing with upper extremity support and activities to improve her strength.  Patient reports she is very nervous to ambulate without contact-guard assist and gait belt without being able to hold onto a wall or steady surface in order to provide her with tactile cues for the areas around her.  PAIN:  Are you having pain? None  PRECAUTIONS: Fall, hemianopsia  WEIGHT BEARING RESTRICTIONS: No  FALLS: Has patient fallen in last 6 months? No  PATIENT GOALS: Improve gait, improve strength, walk on unsteady surfaces  OBJECTIVE:   TODAY'S TREATMENT:  DATE: 01/14/23   Unless otherwise stated, CGA was provided and gait belt donned in order to ensure pt safety  TA  Ascending/Descending 4 Steps x 3 Reps Ambulation 200 feet with Hemiwalker with WC follow - no rest breaks were utilized   TE  STS 2x10 reps without UE support   PATIENT EDUCATION: Education details:Pt educated throughout session about proper posture and  technique with exercises. Improved exercise technique, movement at target joints, use of target muscles after min to mod verbal, visual, tactile cues.  Person educated: Patient Education method: Explanation Education comprehension: verbalized understanding  HOME EXERCISE PROGRAM:  Access Code: [email or web address] URL: [email or web address] Date: 11/24/2022 Prepared by: Thresa Ross  Exercises - Alternating Step Taps with Counter Support  - 7 x weekly - 2 sets - 10 reps - Sit to Stand  - 1 x daily - 7 x weekly - 2 sets - 10 reps - Standing Tandem Balance with Counter Support  - 1 x daily - 3 x weekly - 2 sets - 2 reps - 10 hold - Standing Single Leg Stance with Counter Support  - 1 x daily - 3 x weekly - 2 sets - 2 reps - 5 hold - Seated Long Arc Quad with Ankle Weight  - 1 x daily - 7 x weekly - 2 sets - 10 reps  Previous: Pt performs leg lifts and squats with UE support    GOALS: Goals reviewed with patient? Yes  SHORT TERM GOALS: Target date: 06/24/2022    Patient will be independent in home exercise program to improve strength/mobility for better functional independence with ADLs. Baseline: No HEP currently 9/12: pt completing HEP regularly.  Goal status: MET  LONG TERM GOALS: Target date: 01/29/2023    1.  Patient (> 37 years old) will complete five times sit to stand test in < 15 seconds indicating an increased LE strength and improved balance. Baseline:  Test second visit ; 05/29/2022: 40 sec pull to stance RUE 07/10/22: 22.82 sec. 08/18/22: 16.36 sec with UE support 09/29/22:19.32 sec, 42 sec without UE assist  9/9:19.86 sec no UE assist  Goal status: ONGOING/ PROGRESSING  2.  Patient will increase FOTO score by 8 or more points  to demonstrate statistically significant improvement in mobility and quality of life.  Baseline: Test second visit; 05/28/2040: 45; 07/10/22: 41, 08/18/22: 45 8/5:48 Goal status: ONGOING  3.  Patient will increase 10 meter walk test to >.43m/s  as to improve gait speed for better community ambulation and to reduce fall risk. Baseline: Test visit 2 06/19/22: 53 sec 5/16: 77 sec. . 8/5:46 sec with SPC, 33 sec with RW 9/12: 37.66 sec, 24 sec with 3WW Goal status: ONGOING   4. Patient will improve ability to ambulate on uneven surfaces as evidenced by ability to ambulate over simulated uneven terrain in clinic to improve independence with ambulation. 9/12: pt ambulates over red mat and blue mat without LOB, still not confident on red mat  Goal status: MET  5.  Pt will ascend and descend 4 stairs with unilateral UE assist and with step through pattern of gait and without any sign of imbalance to improve her community mobility.  Baseline: Pt has R lateral lean with ascending and descending and has LLE internal rotation of foot when stepping down.  Goal status: INITIAL  6.  Pt will step on and off of curb or simulated curb using SPC in order to allow for improved community mobility.  Baseline: Unable, attempted 9/12 Goal status:  INITIAL       ASSESSMENT:  CLINICAL IMPRESSION:   Pt tolerated all tasks during today's visit. Pt required CGA ascending/descending stairs, but demonstrated slightly more difficulty descending stairs compared to ascending stairs. Pt was able to ambulate 200 feet with no therapeutic rest breaks. Ambulation over uneven surfaces and over curbs were not attempted today, due to inclement weather outside. Pt will continue to benefit from skilled physical therapy intervention to address impairments, improve QOL, and attain therapy goals.    OBJECTIVE IMPAIRMENTS: Abnormal gait, decreased activity tolerance, decreased balance, decreased endurance, decreased strength, decreased safety awareness, hypomobility, impaired flexibility, and impaired UE functional use.   ACTIVITY LIMITATIONS: lifting, bending, standing, stairs, transfers, bed mobility, and locomotion level  PARTICIPATION LIMITATIONS: meal prep, cleaning,  shopping, and community activity  PERSONAL FACTORS: Time since onset of injury/illness/exacerbation and 3+ comorbidities: diabetes, HTN, Multiple previous strokes   are also affecting patient's functional outcome.   REHAB POTENTIAL: Fair secondary to time since onset and severely of visual deficits and L UE funcitonal use   CLINICAL DECISION MAKING: Stable/uncomplicated  EVALUATION COMPLEXITY: Low  PLAN:  PT FREQUENCY: 2x/week  PT DURATION: 12 weeks  PLANNED INTERVENTIONS: Therapeutic exercises, Therapeutic activity, Neuromuscular re-education, Balance training, Gait training, Patient/Family education, Self Care, Joint mobilization, Stair training, Moist heat, Manual therapy, and Re-evaluation  PLAN FOR NEXT SESSION:    Ambulation and other functional movement tasks while speaking ( not stopping ambulating when talking)  Working on curb navigation stepping on and off of curbs as well as stair navigation both ascending and descending. Ambulation with cane or 3WW on uneven surfaces ( inclines, declines, etc)  Improve confidence with getting in and out of a chair.   Debara Pickett, SPT  Norman Herrlich PT ,DPT Physical Therapist- [place]  [facility]   4:20 PM 01/14/23

## 2023-01-14 NOTE — Therapy (Incomplete)
OUTPATIENT PHYSICAL THERAPY NEURO TREATMENT      Patient Name: [name] MRN: [id number] DOB:May 20, 1950, 72 y.o., female Today's Date: 01/14/2023   PCP:  Enid Baas, MD   REFERRING PROVIDER: Morene Crocker, MD   END OF SESSION:                          Past Medical History:  Diagnosis Date   Abdominal pain 12/06/2013   Abnormality of gait 01/26/2013   Altered bowel habits 12/11/2017   Bruises easily    Constipation    Crohn's disease (HCC)    Depression    Dizziness and giddiness 01/26/2013   Fecal incontinence alternating with constipation 12/11/2017   GERD (gastroesophageal reflux disease)    Hemianopia    left   Hemiparesis and alteration of sensations as late effects of stroke (HCC) 01/26/2013   Hyperlipidemia    Hypertension    Hypothyroidism    Lump in female breast    Panic disorder    PONV (postoperative nausea and vomiting)    Stroke (HCC) 2009   Tremor, essential 04/24/2020   Type II diabetes mellitus (HCC)    Past Surgical History:  Procedure Laterality Date   ANAL FISSURE REPAIR  2008   APPENDECTOMY  1986   BREAST BIOPSY Left 2006   neg   BREAST LUMPECTOMY Right 1990's   CESAREAN SECTION  1981; 1983   CHOLECYSTECTOMY  1986   SMALL INTESTINE SURGERY  05/1991   TUBAL LIGATION  1983   Patient Active Problem List   Diagnosis Date Noted   Bone spur 06/13/2021   Acute CVA (cerebrovascular accident) (HCC) 03/07/2021   CVA (cerebral vascular accident) (HCC) 11/01/2020   Chronic pain 04/25/2020   Chronic constipation 04/25/2020   Gastro-esophageal reflux disease without esophagitis 04/25/2020   Hardening of the aorta (main artery of the heart) (HCC) 04/25/2020   History of iron deficiency anemia 04/25/2020   Hypercholesteremia 04/25/2020   Hypertension 04/25/2020   Personal history of transient ischemic attack (TIA), and cerebral infarction without residual deficits 04/25/2020   Pulmonary emphysema (HCC)  04/25/2020   Vitamin D deficiency 04/25/2020   Tremor, essential 04/24/2020   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   Right lower quadrant abdominal pain 08/18/2018   Spastic hemiplegia affecting nondominant side (HCC) 02/03/2014   Diabetes mellitus (HCC) 09/07/2013   Hypothyroidism 09/07/2013   Type 2 diabetes mellitus without complications (HCC) 09/07/2013   Hemiparesis and alteration of sensations as late effects of stroke (HCC) 01/26/2013   Unsteady gait 01/26/2013   Dizziness and giddiness 01/26/2013    ONSET DATE: 2009  REFERRING DIAG: Z86.73 (ICD-10-CM) - History of stroke   THERAPY DIAG:  Muscle weakness (generalized)  Difficulty in walking, not elsewhere classified  Other abnormalities of gait and mobility  Unsteadiness on feet  Abnormality of gait and mobility  Rationale for Evaluation and Treatment: Rehabilitation  SUBJECTIVE:  SUBJECTIVE STATEMENT:   Patient reports no significant changes since previous session.  Pt accompanied by: self  PERTINENT HISTORY: Pt has had one major stroke and 5 mini strokes since 2009. Pt is here since her doctor wants her to work on her strength and balance and she also wants to improve in these categories. Pt has hemianopsia with blindness inferior, left side and superior. Pt initially had stroke in 2009 and was previously able to walk for long distances. Pt has 650 feet driveway that is gravel and used to walk up and down with the cane. Pt has issues with depth perception. Pt is unable to tell angles of concrete and holes in the ground. Pt has service dogs that are retired, one for guidance and one for helping with picking up and retrieving items. Pt had most recent stroke in January 2023 that further limited her field of vision.  Patient wants  to work diligently on her strength, balance, mobility, and in order to improve her ability to ambulate on various surfaces in various scenarios.  Patient wanted to come to outpatient services to utilize equipment to improve her strength and a greater capacity than home health was able to assist her with.  She is doing home exercise program involving mostly standing with upper extremity support and activities to improve her strength.  Patient reports she is very nervous to ambulate without contact-guard assist and gait belt without being able to hold onto a wall or steady surface in order to provide her with tactile cues for the areas around her.  PAIN:  Are you having pain? None  PRECAUTIONS: Fall, hemianopsia  WEIGHT BEARING RESTRICTIONS: No  FALLS: Has patient fallen in last 6 months? No  PATIENT GOALS: Improve gait, improve strength, walk on unsteady surfaces  OBJECTIVE:   TODAY'S TREATMENT:                                                                                                                              DATE: 01/14/23   Unless otherwise stated, CGA was provided and gait belt donned in order to ensure pt safety  TA  Patient transported wheelchair to healing garden where patient walks on pavement with very slight variable inclinations. -Ambulation x 40 feet with cues for step through pattern of gait with the right lower extremity as well as for proper sequencing with hemiwalker to improve patient's efficiency with gait.  Physical therapist also explains why efficiency with gait is optimal for walking.  Anterior and posterior slides in  outdoor chair working on improving patient's efficiency with getting to edge of chair for efficient sit to stand transitions.  X 10 repetitions  Ambulation x 40 feet with same challenges and cues from earlier ambulatory bout.  Improved efficiency with step through pattern of gait for latter half of this ambulatory bout.  TE  STS x 10  reps -Upper extremity support on hemiwalker for practice with sit to stands with this.  Patient challenged to use 1 "rock on second set of 5 repetitions and 2 "rocks on first set of 5 repetitions for total of 10 reps.  Patient efficiency with sit to stand did improve.  In wheelchair x 20 feet of hamstring curls using bilateral hamstrings to propel wheelchair forward.  Followed by 20 feet of utilizing left lower extremity only for propulsion in wheelchair, this was a lot more time-consuming compared to the other but was beneficial for improving patient's ability to transition forward in the wheelchair and for hamstring activation     PATIENT EDUCATION: Education details:Pt educated throughout session about proper posture and technique with exercises. Improved exercise technique, movement at target joints, use of target muscles after min to mod verbal, visual, tactile cues.  Person educated: Patient Education method: Explanation Education comprehension: verbalized understanding  HOME EXERCISE PROGRAM:  Access Code: [email or web address] URL: [email or web address] Date: 11/24/2022 Prepared by: Thresa Ross  Exercises - Alternating Step Taps with Counter Support  - 7 x weekly - 2 sets - 10 reps - Sit to Stand  - 1 x daily - 7 x weekly - 2 sets - 10 reps - Standing Tandem Balance with Counter Support  - 1 x daily - 3 x weekly - 2 sets - 2 reps - 10 hold - Standing Single Leg Stance with Counter Support  - 1 x daily - 3 x weekly - 2 sets - 2 reps - 5 hold - Seated Long Arc Quad with Ankle Weight  - 1 x daily - 7 x weekly - 2 sets - 10 reps  Previous: Pt performs leg lifts and squats with UE support    GOALS: Goals reviewed with patient? Yes  SHORT TERM GOALS: Target date: 06/24/2022    Patient will be independent in home exercise program to improve strength/mobility for better functional independence with ADLs. Baseline: No HEP currently 9/12: pt completing HEP regularly.  Goal  status: MET  LONG TERM GOALS: Target date: 01/29/2023    1.  Patient (> 38 years old) will complete five times sit to stand test in < 15 seconds indicating an increased LE strength and improved balance. Baseline:  Test second visit ; 05/29/2022: 40 sec pull to stance RUE 07/10/22: 22.82 sec. 08/18/22: 16.36 sec with UE support 09/29/22:19.32 sec, 42 sec without UE assist  9/9:19.86 sec no UE assist  Goal status: ONGOING/ PROGRESSING  2.  Patient will increase FOTO score by 8 or more points  to demonstrate statistically significant improvement in mobility and quality of life.  Baseline: Test second visit; 05/28/2040: 45; 07/10/22: 41, 08/18/22: 45 8/5:48 Goal status: ONGOING  3.  Patient will increase 10 meter walk test to >.14m/s as to improve gait speed for better community ambulation and to reduce fall risk. Baseline: Test visit 2 06/19/22: 53 sec 5/16: 77 sec. . 8/5:46 sec with SPC, 33 sec with RW 9/12: 37.66 sec, 24 sec with 3WW Goal status: ONGOING   4. Patient will improve ability to ambulate on uneven surfaces as evidenced by ability to ambulate over simulated uneven terrain in clinic to improve independence with ambulation. 9/12: pt ambulates over red mat and blue mat without LOB, still not confident on red mat  Goal status: MET  5.  Pt will ascend and descend 4 stairs with unilateral UE assist and with step through pattern of gait and without any sign of imbalance to improve her community mobility.  Baseline: Pt has R lateral lean with ascending and descending and has LLE internal  rotation of foot when stepping down.  Goal status: INITIAL  6.  Pt will step on and off of curb or simulated curb using SPC in order to allow for improved community mobility.  Baseline: Unable, attempted 9/12 Goal status: INITIAL       ASSESSMENT:  CLINICAL IMPRESSION:   Pt presents with good motivation for completion of PT activities.  Patient brings hemiwalker to therapy session again for continued  practice with this.  Most of session focused on improving ambulation safety as well as efficiency with this device and working on improved staggered stance balance as well as step through pattern of gait.  Also continuing to practice with sit to stand transitions and improving patient's strength and form with completing this as well as improving patient's ability to get into the proper posture for completing this with greater ease.Pt will continue to benefit from skilled physical therapy intervention to address impairments, improve QOL, and attain therapy goals.         OBJECTIVE IMPAIRMENTS: Abnormal gait, decreased activity tolerance, decreased balance, decreased endurance, decreased strength, decreased safety awareness, hypomobility, impaired flexibility, and impaired UE functional use.   ACTIVITY LIMITATIONS: lifting, bending, standing, stairs, transfers, bed mobility, and locomotion level  PARTICIPATION LIMITATIONS: meal prep, cleaning, shopping, and community activity  PERSONAL FACTORS: Time since onset of injury/illness/exacerbation and 3+ comorbidities: diabetes, HTN, Multiple previous strokes   are also affecting patient's functional outcome.   REHAB POTENTIAL: Fair secondary to time since onset and severely of visual deficits and L UE funcitonal use   CLINICAL DECISION MAKING: Stable/uncomplicated  EVALUATION COMPLEXITY: Low  PLAN:  PT FREQUENCY: 2x/week  PT DURATION: 12 weeks  PLANNED INTERVENTIONS: Therapeutic exercises, Therapeutic activity, Neuromuscular re-education, Balance training, Gait training, Patient/Family education, Self Care, Joint mobilization, Stair training, Moist heat, Manual therapy, and Re-evaluation  PLAN FOR NEXT SESSION:   Ambulation and other functional movement tasks while speaking ( not stopping ambulating when talking)  Working on curb navigation stepping on and off of curbs as well as stair navigation both ascending and descending. Ambulation  with cane or 3WW on uneven surfaces ( inclines, declines, etc)  Improve confidence with getting in and out of a chair.    Norman Herrlich PT ,DPT Physical Therapist- [facility]   9:07 AM 01/14/23

## 2023-01-15 DIAGNOSIS — E1142 Type 2 diabetes mellitus with diabetic polyneuropathy: Secondary | ICD-10-CM | POA: Diagnosis not present

## 2023-01-19 ENCOUNTER — Ambulatory Visit: Payer: PPO

## 2023-01-19 DIAGNOSIS — M6281 Muscle weakness (generalized): Secondary | ICD-10-CM

## 2023-01-19 DIAGNOSIS — R2681 Unsteadiness on feet: Secondary | ICD-10-CM

## 2023-01-19 DIAGNOSIS — R269 Unspecified abnormalities of gait and mobility: Secondary | ICD-10-CM

## 2023-01-19 DIAGNOSIS — R2689 Other abnormalities of gait and mobility: Secondary | ICD-10-CM

## 2023-01-19 DIAGNOSIS — R262 Difficulty in walking, not elsewhere classified: Secondary | ICD-10-CM

## 2023-01-19 DIAGNOSIS — R278 Other lack of coordination: Secondary | ICD-10-CM

## 2023-01-19 NOTE — Therapy (Signed)
OUTPATIENT PHYSICAL THERAPY NEURO TREATMENT      Patient Name: [name] MRN: [id number] DOB:14-Apr-1950, 72 y.o., female Today's Date: 01/19/2023   PCP:  Enid Baas, MD   REFERRING PROVIDER: Morene Crocker, MD   END OF SESSION:  PT End of Session - 01/19/23 1545     Visit Number 48    Number of Visits 64    Date for PT Re-Evaluation 01/29/23    Authorization Type [employer]    Progress Note Due on Visit 50    PT Start Time 1530    PT Stop Time 1510    PT Time Calculation (min) 1420 min    Equipment Utilized During Treatment Gait belt    Activity Tolerance Patient tolerated treatment well;No increased pain    Behavior During Therapy [facility] for tasks assessed/performed               Past Medical History:  Diagnosis Date   Abdominal pain 12/06/2013   Abnormality of gait 01/26/2013   Altered bowel habits 12/11/2017   Bruises easily    Constipation    Crohn's disease (HCC)    Depression    Dizziness and giddiness 01/26/2013   Fecal incontinence alternating with constipation 12/11/2017   GERD (gastroesophageal reflux disease)    Hemianopia    left   Hemiparesis and alteration of sensations as late effects of stroke (HCC) 01/26/2013   Hyperlipidemia    Hypertension    Hypothyroidism    Lump in female breast    Panic disorder    PONV (postoperative nausea and vomiting)    Stroke (HCC) 2009   Tremor, essential 04/24/2020   Type II diabetes mellitus (HCC)    Past Surgical History:  Procedure Laterality Date   ANAL FISSURE REPAIR  2008   APPENDECTOMY  1986   BREAST BIOPSY Left 2006   neg   BREAST LUMPECTOMY Right 1990's   CESAREAN SECTION  1981; 1983   CHOLECYSTECTOMY  1986   SMALL INTESTINE SURGERY  05/1991   TUBAL LIGATION  1983   Patient Active Problem List   Diagnosis Date Noted   Bone spur 06/13/2021   Acute CVA (cerebrovascular accident) (HCC) 03/07/2021   CVA (cerebral vascular accident) (HCC) 11/01/2020   Chronic pain  04/25/2020   Chronic constipation 04/25/2020   Gastro-esophageal reflux disease without esophagitis 04/25/2020   Hardening of the aorta (main artery of the heart) (HCC) 04/25/2020   History of iron deficiency anemia 04/25/2020   Hypercholesteremia 04/25/2020   Hypertension 04/25/2020   Personal history of transient ischemic attack (TIA), and cerebral infarction without residual deficits 04/25/2020   Pulmonary emphysema (HCC) 04/25/2020   Vitamin D deficiency 04/25/2020   Tremor, essential 04/24/2020   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   Right lower quadrant abdominal pain 08/18/2018   Spastic hemiplegia affecting nondominant side (HCC) 02/03/2014   Diabetes mellitus (HCC) 09/07/2013   Hypothyroidism 09/07/2013   Type 2 diabetes mellitus without complications (HCC) 09/07/2013   Hemiparesis and alteration of sensations as late effects of stroke (HCC) 01/26/2013   Unsteady gait 01/26/2013   Dizziness and giddiness 01/26/2013    ONSET DATE: 2009  REFERRING DIAG: Z86.73 (ICD-10-CM) - History of stroke   THERAPY DIAG:  Muscle weakness (generalized)  Difficulty in walking, not elsewhere classified  Other abnormalities of gait and mobility  Unsteadiness on feet  Abnormality of gait and mobility  Other lack of coordination  Rationale for Evaluation and Treatment: Rehabilitation  SUBJECTIVE:  SUBJECTIVE STATEMENT:  Patient reports no significant changes or falls since previous session. Pt reported 0/10 on NPS.   Pt accompanied by: self  PERTINENT HISTORY: Pt has had one major stroke and 5 mini strokes since 2009. Pt is here since her doctor wants her to work on her strength and balance and she also wants to improve in these categories. Pt has hemianopsia with blindness inferior, left  side and superior. Pt initially had stroke in 2009 and was previously able to walk for long distances. Pt has 650 feet driveway that is gravel and used to walk up and down with the cane. Pt has issues with depth perception. Pt is unable to tell angles of concrete and holes in the ground. Pt has service dogs that are retired, one for guidance and one for helping with picking up and retrieving items. Pt had most recent stroke in January 2023 that further limited her field of vision.  Patient wants to work diligently on her strength, balance, mobility, and in order to improve her ability to ambulate on various surfaces in various scenarios.  Patient wanted to come to outpatient services to utilize equipment to improve her strength and a greater capacity than home health was able to assist her with.  She is doing home exercise program involving mostly standing with upper extremity support and activities to improve her strength.  Patient reports she is very nervous to ambulate without contact-guard assist and gait belt without being able to hold onto a wall or steady surface in order to provide her with tactile cues for the areas around her.  PAIN:  Are you having pain? None  PRECAUTIONS: Fall, hemianopsia  WEIGHT BEARING RESTRICTIONS: No  FALLS: Has patient fallen in last 6 months? No  PATIENT GOALS: Improve gait, improve strength, walk on unsteady surfaces  OBJECTIVE:   TODAY'S TREATMENT:                                                                                                                              DATE: 01/19/23  -AMB overground 76ft, Rt hemiwalker -STS from chair x10  -cable hamstrings flexion from WC LLE 2x10 @ 2.5, 1x20 @ 7.5; 1x10 @ 12.5  -STS from WC feet on airex foam 10x10secH, hand transition to HW -AMB overground, 74ft fwd c HW and 2lb AW bilat; retro stepping 67ft c 2lb AW    PATIENT EDUCATION: Education details:Pt educated throughout session about proper posture and  technique with exercises. Improved exercise technique, movement at target joints, use of target muscles after min to mod verbal, visual, tactile cues.  Person educated: Patient Education method: Explanation Education comprehension: verbalized understanding  HOME EXERCISE PROGRAM:  Access Code: [email or web address] URL: [email or web address] Date: 11/24/2022 Prepared by: Thresa Ross  Exercises - Alternating Step Taps with Counter Support  - 7 x weekly - 2 sets - 10 reps - Sit to Stand  - 1 x daily - 7 x weekly - 2 sets -  10 reps - Standing Tandem Balance with Counter Support  - 1 x daily - 3 x weekly - 2 sets - 2 reps - 10 hold - Standing Single Leg Stance with Counter Support  - 1 x daily - 3 x weekly - 2 sets - 2 reps - 5 hold - Seated Long Arc Quad with Ankle Weight  - 1 x daily - 7 x weekly - 2 sets - 10 reps  Previous: Pt performs leg lifts and squats with UE support    GOALS: Goals reviewed with patient? Yes  SHORT TERM GOALS: Target date: 06/24/2022    Patient will be independent in home exercise program to improve strength/mobility for better functional independence with ADLs. Baseline: No HEP currently 9/12: pt completing HEP regularly.  Goal status: MET  LONG TERM GOALS: Target date: 01/29/2023    1.  Patient (> 34 years old) will complete five times sit to stand test in < 15 seconds indicating an increased LE strength and improved balance. Baseline:  Test second visit ; 05/29/2022: 40 sec pull to stance RUE 07/10/22: 22.82 sec. 08/18/22: 16.36 sec with UE support 09/29/22:19.32 sec, 42 sec without UE assist  9/9:19.86 sec no UE assist  Goal status: ONGOING/ PROGRESSING  2.  Patient will increase FOTO score by 8 or more points  to demonstrate statistically significant improvement in mobility and quality of life.  Baseline: Test second visit; 05/28/2040: 45; 07/10/22: 41, 08/18/22: 45 8/5:48 Goal status: ONGOING  3.  Patient will increase 10 meter walk test to >.55m/s  as to improve gait speed for better community ambulation and to reduce fall risk. Baseline: Test visit 2 06/19/22: 53 sec 5/16: 77 sec. . 8/5:46 sec with SPC, 33 sec with RW 9/12: 37.66 sec, 24 sec with 3WW Goal status: ONGOING   4. Patient will improve ability to ambulate on uneven surfaces as evidenced by ability to ambulate over simulated uneven terrain in clinic to improve independence with ambulation. 9/12: pt ambulates over red mat and blue mat without LOB, still not confident on red mat  Goal status: MET  5.  Pt will ascend and descend 4 stairs with unilateral UE assist and with step through pattern of gait and without any sign of imbalance to improve her community mobility.  Baseline: Pt has R lateral lean with ascending and descending and has LLE internal rotation of foot when stepping down.  Goal status: INITIAL  6.  Pt will step on and off of curb or simulated curb using SPC in order to allow for improved community mobility.  Baseline: Unable, attempted 9/12 Goal status: INITIAL       ASSESSMENT:  CLINICAL IMPRESSION:  Pt working on overground mobility strategies with adapated techniques, minimal cues needed, pt able to recall cues from previous visits. Generally good tolerance to session overall, rest breaks taken only to allow for transition to other activities. We speak extensively regarding horses. Pt will continue to benefit from skilled physical therapy intervention to address impairments, improve QOL, and attain therapy goals.    OBJECTIVE IMPAIRMENTS: Abnormal gait, decreased activity tolerance, decreased balance, decreased endurance, decreased strength, decreased safety awareness, hypomobility, impaired flexibility, and impaired UE functional use.   ACTIVITY LIMITATIONS: lifting, bending, standing, stairs, transfers, bed mobility, and locomotion level  PARTICIPATION LIMITATIONS: meal prep, cleaning, shopping, and community activity  PERSONAL FACTORS: Time since  onset of injury/illness/exacerbation and 3+ comorbidities: diabetes, HTN, Multiple previous strokes   are also affecting patient's functional outcome.   REHAB POTENTIAL:  Fair secondary to time since onset and severely of visual deficits and L UE funcitonal use   CLINICAL DECISION MAKING: Stable/uncomplicated  EVALUATION COMPLEXITY: Low  PLAN:  PT FREQUENCY: 2x/week  PT DURATION: 12 weeks  PLANNED INTERVENTIONS: Therapeutic exercises, Therapeutic activity, Neuromuscular re-education, Balance training, Gait training, Patient/Family education, Self Care, Joint mobilization, Stair training, Moist heat, Manual therapy, and Re-evaluation  PLAN FOR NEXT SESSION:  Ambulation and other functional movement tasks while speaking ( not stopping ambulating when talking)  Working on curb navigation stepping on and off of curbs as well as stair navigation both ascending and descending. Ambulation with cane or 3WW on uneven surfaces ( inclines, declines, etc)  Improve confidence with getting in and out of a chair.    3:50 PM, 01/19/23 Rosamaria Lints, PT, DPT Physical Therapist - [facility]  Outpatient Physical Therapy- [facility] [phone]

## 2023-01-26 ENCOUNTER — Ambulatory Visit: Payer: PPO | Attending: Neurology | Admitting: Physical Therapy

## 2023-01-26 ENCOUNTER — Ambulatory Visit: Payer: PPO

## 2023-01-26 DIAGNOSIS — R262 Difficulty in walking, not elsewhere classified: Secondary | ICD-10-CM | POA: Diagnosis not present

## 2023-01-26 DIAGNOSIS — M6281 Muscle weakness (generalized): Secondary | ICD-10-CM | POA: Diagnosis not present

## 2023-01-26 DIAGNOSIS — R2689 Other abnormalities of gait and mobility: Secondary | ICD-10-CM | POA: Insufficient documentation

## 2023-01-26 DIAGNOSIS — R269 Unspecified abnormalities of gait and mobility: Secondary | ICD-10-CM | POA: Insufficient documentation

## 2023-01-26 DIAGNOSIS — R2681 Unsteadiness on feet: Secondary | ICD-10-CM | POA: Insufficient documentation

## 2023-01-26 NOTE — Therapy (Signed)
OUTPATIENT PHYSICAL THERAPY NEURO TREATMENT      Patient Name: [name] MRN: [id number] DOB:1950/04/01, 72 y.o., female Today's Date: 01/26/2023   PCP:  Enid Baas, MD   REFERRING PROVIDER: Morene Crocker, MD   END OF SESSION:  PT End of Session - 01/26/23 1557     Visit Number 49    Number of Visits 64    Date for PT Re-Evaluation 01/29/23    Authorization Type [employer]    Progress Note Due on Visit 50    PT Start Time 1534    PT Stop Time 1615    PT Time Calculation (min) 41 min    Equipment Utilized During Treatment Gait belt    Activity Tolerance Patient tolerated treatment well;No increased pain    Behavior During Therapy [facility] for tasks assessed/performed                Past Medical History:  Diagnosis Date   Abdominal pain 12/06/2013   Abnormality of gait 01/26/2013   Altered bowel habits 12/11/2017   Bruises easily    Constipation    Crohn's disease (HCC)    Depression    Dizziness and giddiness 01/26/2013   Fecal incontinence alternating with constipation 12/11/2017   GERD (gastroesophageal reflux disease)    Hemianopia    left   Hemiparesis and alteration of sensations as late effects of stroke (HCC) 01/26/2013   Hyperlipidemia    Hypertension    Hypothyroidism    Lump in female breast    Panic disorder    PONV (postoperative nausea and vomiting)    Stroke (HCC) 2009   Tremor, essential 04/24/2020   Type II diabetes mellitus (HCC)    Past Surgical History:  Procedure Laterality Date   ANAL FISSURE REPAIR  2008   APPENDECTOMY  1986   BREAST BIOPSY Left 2006   neg   BREAST LUMPECTOMY Right 1990's   CESAREAN SECTION  1981; 1983   CHOLECYSTECTOMY  1986   SMALL INTESTINE SURGERY  05/1991   TUBAL LIGATION  1983   Patient Active Problem List   Diagnosis Date Noted   Bone spur 06/13/2021   Acute CVA (cerebrovascular accident) (HCC) 03/07/2021   CVA (cerebral vascular accident) (HCC) 11/01/2020   Chronic pain  04/25/2020   Chronic constipation 04/25/2020   Gastro-esophageal reflux disease without esophagitis 04/25/2020   Hardening of the aorta (main artery of the heart) (HCC) 04/25/2020   History of iron deficiency anemia 04/25/2020   Hypercholesteremia 04/25/2020   Hypertension 04/25/2020   Personal history of transient ischemic attack (TIA), and cerebral infarction without residual deficits 04/25/2020   Pulmonary emphysema (HCC) 04/25/2020   Vitamin D deficiency 04/25/2020   Tremor, essential 04/24/2020   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   Right lower quadrant abdominal pain 08/18/2018   Spastic hemiplegia affecting nondominant side (HCC) 02/03/2014   Diabetes mellitus (HCC) 09/07/2013   Hypothyroidism 09/07/2013   Type 2 diabetes mellitus without complications (HCC) 09/07/2013   Hemiparesis and alteration of sensations as late effects of stroke (HCC) 01/26/2013   Unsteady gait 01/26/2013   Dizziness and giddiness 01/26/2013    ONSET DATE: 2009  REFERRING DIAG: Z86.73 (ICD-10-CM) - History of stroke   THERAPY DIAG:  Muscle weakness (generalized)  Difficulty in walking, not elsewhere classified  Other abnormalities of gait and mobility  Unsteadiness on feet  Abnormality of gait and mobility  Rationale for Evaluation and Treatment: Rehabilitation  SUBJECTIVE:  SUBJECTIVE STATEMENT:   Pt reports no significant changes since last session.   Pt accompanied by: self  PERTINENT HISTORY: Pt has had one major stroke and 5 mini strokes since 2009. Pt is here since her doctor wants her to work on her strength and balance and she also wants to improve in these categories. Pt has hemianopsia with blindness inferior, left side and superior. Pt initially had stroke in 2009 and was previously  able to walk for long distances. Pt has 650 feet driveway that is gravel and used to walk up and down with the cane. Pt has issues with depth perception. Pt is unable to tell angles of concrete and holes in the ground. Pt has service dogs that are retired, one for guidance and one for helping with picking up and retrieving items. Pt had most recent stroke in January 2023 that further limited her field of vision.  Patient wants to work diligently on her strength, balance, mobility, and in order to improve her ability to ambulate on various surfaces in various scenarios.  Patient wanted to come to outpatient services to utilize equipment to improve her strength and a greater capacity than home health was able to assist her with.  She is doing home exercise program involving mostly standing with upper extremity support and activities to improve her strength.  Patient reports she is very nervous to ambulate without contact-guard assist and gait belt without being able to hold onto a wall or steady surface in order to provide her with tactile cues for the areas around her.  PAIN:  Are you having pain? None  PRECAUTIONS: Fall, hemianopsia  WEIGHT BEARING RESTRICTIONS: No  FALLS: Has patient fallen in last 6 months? No  PATIENT GOALS: Improve gait, improve strength, walk on unsteady surfaces  OBJECTIVE:   TODAY'S TREATMENT:                                                                                                                              DATE: 01/26/23  -AMB overground 20 ft forward, 20 ft retro and 40 ft normal with HW and 2# AW donned -STS from chair x10 with airex pad under feet, Uni UE needed on occasion.  -Amb overground x 160 ft with HW focus on step through gait pattern wityh correct sequencing  Seated HS curl x 15 reps with YTB    PATIENT EDUCATION: Education details:Pt educated throughout session about proper posture and technique with exercises. Improved exercise technique,  movement at target joints, use of target muscles after min to mod verbal, visual, tactile cues.  Person educated: Patient Education method: Explanation Education comprehension: verbalized understanding  HOME EXERCISE PROGRAM:  Access Code: [email or web address] URL: [email or web address] Date: 11/24/2022 Prepared by: Thresa Ross  Exercises - Alternating Step Taps with Counter Support  - 7 x weekly - 2 sets - 10 reps - Sit to Stand  - 1 x daily - 7 x weekly - 2 sets - 10  reps - Standing Tandem Balance with Counter Support  - 1 x daily - 3 x weekly - 2 sets - 2 reps - 10 hold - Standing Single Leg Stance with Counter Support  - 1 x daily - 3 x weekly - 2 sets - 2 reps - 5 hold - Seated Long Arc Quad with Ankle Weight  - 1 x daily - 7 x weekly - 2 sets - 10 reps  Previous: Pt performs leg lifts and squats with UE support    GOALS: Goals reviewed with patient? Yes  SHORT TERM GOALS: Target date: 06/24/2022    Patient will be independent in home exercise program to improve strength/mobility for better functional independence with ADLs. Baseline: No HEP currently 9/12: pt completing HEP regularly.  Goal status: MET  LONG TERM GOALS: Target date: 01/29/2023    1.  Patient (> 25 years old) will complete five times sit to stand test in < 15 seconds indicating an increased LE strength and improved balance. Baseline:  Test second visit ; 05/29/2022: 40 sec pull to stance RUE 07/10/22: 22.82 sec. 08/18/22: 16.36 sec with UE support 09/29/22:19.32 sec, 42 sec without UE assist  9/9:19.86 sec no UE assist  Goal status: ONGOING/ PROGRESSING  2.  Patient will increase FOTO score by 8 or more points  to demonstrate statistically significant improvement in mobility and quality of life.  Baseline: Test second visit; 05/28/2040: 45; 07/10/22: 41, 08/18/22: 45 8/5:48 Goal status: ONGOING  3.  Patient will increase 10 meter walk test to >.59m/s as to improve gait speed for better community  ambulation and to reduce fall risk. Baseline: Test visit 2 06/19/22: 53 sec 5/16: 77 sec. . 8/5:46 sec with SPC, 33 sec with RW 9/12: 37.66 sec, 24 sec with 3WW Goal status: ONGOING   4. Patient will improve ability to ambulate on uneven surfaces as evidenced by ability to ambulate over simulated uneven terrain in clinic to improve independence with ambulation. 9/12: pt ambulates over red mat and blue mat without LOB, still not confident on red mat  Goal status: MET  5.  Pt will ascend and descend 4 stairs with unilateral UE assist and with step through pattern of gait and without any sign of imbalance to improve her community mobility.  Baseline: Pt has R lateral lean with ascending and descending and has LLE internal rotation of foot when stepping down.  Goal status: INITIAL  6.  Pt will step on and off of curb or simulated curb using SPC in order to allow for improved community mobility.  Baseline: Unable, attempted 9/12 Goal status: INITIAL       ASSESSMENT:  CLINICAL IMPRESSION:  Patient arrived with good motivation form completion of pt activities.  Pt progresses with ambulation with step through gait pattern with hemiwalker.  Patient required intermittent cues in order to allow for proper ambulatory pattern.  Patient still gets in step to pattern of gait with doorways and turns will continue to focus on this in the future.Pt will continue to benefit from skilled physical therapy intervention to address impairments, improve QOL, and attain therapy goals.     OBJECTIVE IMPAIRMENTS: Abnormal gait, decreased activity tolerance, decreased balance, decreased endurance, decreased strength, decreased safety awareness, hypomobility, impaired flexibility, and impaired UE functional use.   ACTIVITY LIMITATIONS: lifting, bending, standing, stairs, transfers, bed mobility, and locomotion level  PARTICIPATION LIMITATIONS: meal prep, cleaning, shopping, and community activity  PERSONAL  FACTORS: Time since onset of injury/illness/exacerbation and 3+ comorbidities: diabetes, HTN,  Multiple previous strokes   are also affecting patient's functional outcome.   REHAB POTENTIAL: Fair secondary to time since onset and severely of visual deficits and L UE funcitonal use   CLINICAL DECISION MAKING: Stable/uncomplicated  EVALUATION COMPLEXITY: Low  PLAN:  PT FREQUENCY: 2x/week  PT DURATION: 12 weeks  PLANNED INTERVENTIONS: Therapeutic exercises, Therapeutic activity, Neuromuscular re-education, Balance training, Gait training, Patient/Family education, Self Care, Joint mobilization, Stair training, Moist heat, Manual therapy, and Re-evaluation  PLAN FOR NEXT SESSION:  Ambulation and other functional movement tasks while speaking ( not stopping ambulating when talking)  Working on curb navigation stepping on and off of curbs as well as stair navigation both ascending and descending. Improve confidence with getting in and out of a chair.    4:21 PM, 01/26/23 Norman Herrlich PT ,DPT Physical Therapist- [place]  [facility]

## 2023-01-28 ENCOUNTER — Other Ambulatory Visit: Payer: Self-pay

## 2023-01-28 ENCOUNTER — Ambulatory Visit: Payer: PPO | Admitting: Physical Therapy

## 2023-01-28 DIAGNOSIS — I10 Essential (primary) hypertension: Secondary | ICD-10-CM | POA: Diagnosis not present

## 2023-01-28 DIAGNOSIS — E1169 Type 2 diabetes mellitus with other specified complication: Secondary | ICD-10-CM | POA: Diagnosis not present

## 2023-01-28 DIAGNOSIS — E7800 Pure hypercholesterolemia, unspecified: Secondary | ICD-10-CM | POA: Diagnosis not present

## 2023-01-28 DIAGNOSIS — I639 Cerebral infarction, unspecified: Secondary | ICD-10-CM | POA: Diagnosis not present

## 2023-01-28 DIAGNOSIS — E119 Type 2 diabetes mellitus without complications: Secondary | ICD-10-CM | POA: Diagnosis not present

## 2023-01-28 MED ORDER — ROSUVASTATIN CALCIUM 20 MG PO TABS
20.0000 mg | ORAL_TABLET | Freq: Every day | ORAL | 3 refills | Status: DC
  Filled 2023-01-28: qty 90, 90d supply, fill #0
  Filled 2023-05-09: qty 90, 90d supply, fill #1
  Filled 2023-08-04: qty 90, 90d supply, fill #2
  Filled 2023-11-05: qty 90, 90d supply, fill #3

## 2023-01-29 ENCOUNTER — Other Ambulatory Visit: Payer: Self-pay

## 2023-01-30 ENCOUNTER — Other Ambulatory Visit: Payer: Self-pay

## 2023-01-30 MED ORDER — GABAPENTIN 100 MG PO CAPS
100.0000 mg | ORAL_CAPSULE | Freq: Every day | ORAL | 0 refills | Status: DC
  Filled 2023-01-30: qty 90, 90d supply, fill #0

## 2023-02-02 ENCOUNTER — Other Ambulatory Visit: Payer: Self-pay

## 2023-02-02 ENCOUNTER — Ambulatory Visit: Payer: PPO

## 2023-02-02 ENCOUNTER — Other Ambulatory Visit (HOSPITAL_COMMUNITY): Payer: Self-pay

## 2023-02-02 DIAGNOSIS — R262 Difficulty in walking, not elsewhere classified: Secondary | ICD-10-CM

## 2023-02-02 DIAGNOSIS — R2689 Other abnormalities of gait and mobility: Secondary | ICD-10-CM

## 2023-02-02 DIAGNOSIS — M6281 Muscle weakness (generalized): Secondary | ICD-10-CM

## 2023-02-02 DIAGNOSIS — R2681 Unsteadiness on feet: Secondary | ICD-10-CM

## 2023-02-02 DIAGNOSIS — R269 Unspecified abnormalities of gait and mobility: Secondary | ICD-10-CM

## 2023-02-02 NOTE — Therapy (Signed)
OUTPATIENT PHYSICAL THERAPY NEURO TREATMENT      Patient Name: [name] MRN: [id number] DOB:18-Apr-1950, 72 y.o., female Today's Date: 02/02/2023   PCP:  Enid Baas, MD   REFERRING PROVIDER: Morene Crocker, MD   END OF SESSION:  PT End of Session - 02/02/23 1957     Visit Number 50    Number of Visits 64    Date for PT Re-Evaluation 01/29/23    Authorization Type [employer]    Authorization Time Period 02/02/23-02/24/23    Progress Note Due on Visit 60    PT Start Time 1530    PT Stop Time 1610    PT Time Calculation (min) 40 min    Equipment Utilized During Treatment Gait belt    Activity Tolerance Patient tolerated treatment well;No increased pain    Behavior During Therapy [facility] for tasks assessed/performed                Past Medical History:  Diagnosis Date   Abdominal pain 12/06/2013   Abnormality of gait 01/26/2013   Altered bowel habits 12/11/2017   Bruises easily    Constipation    Crohn's disease (HCC)    Depression    Dizziness and giddiness 01/26/2013   Fecal incontinence alternating with constipation 12/11/2017   GERD (gastroesophageal reflux disease)    Hemianopia    left   Hemiparesis and alteration of sensations as late effects of stroke (HCC) 01/26/2013   Hyperlipidemia    Hypertension    Hypothyroidism    Lump in female breast    Panic disorder    PONV (postoperative nausea and vomiting)    Stroke (HCC) 2009   Tremor, essential 04/24/2020   Type II diabetes mellitus (HCC)    Past Surgical History:  Procedure Laterality Date   ANAL FISSURE REPAIR  2008   APPENDECTOMY  1986   BREAST BIOPSY Left 2006   neg   BREAST LUMPECTOMY Right 1990's   CESAREAN SECTION  1981; 1983   CHOLECYSTECTOMY  1986   SMALL INTESTINE SURGERY  05/1991   TUBAL LIGATION  1983   Patient Active Problem List   Diagnosis Date Noted   Bone spur 06/13/2021   Acute CVA (cerebrovascular accident) (HCC) 03/07/2021   CVA (cerebral  vascular accident) (HCC) 11/01/2020   Chronic pain 04/25/2020   Chronic constipation 04/25/2020   Gastro-esophageal reflux disease without esophagitis 04/25/2020   Hardening of the aorta (main artery of the heart) (HCC) 04/25/2020   History of iron deficiency anemia 04/25/2020   Hypercholesteremia 04/25/2020   Hypertension 04/25/2020   Personal history of transient ischemic attack (TIA), and cerebral infarction without residual deficits 04/25/2020   Pulmonary emphysema (HCC) 04/25/2020   Vitamin D deficiency 04/25/2020   Tremor, essential 04/24/2020   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   Right lower quadrant abdominal pain 08/18/2018   Spastic hemiplegia affecting nondominant side (HCC) 02/03/2014   Diabetes mellitus (HCC) 09/07/2013   Hypothyroidism 09/07/2013   Type 2 diabetes mellitus without complications (HCC) 09/07/2013   Hemiparesis and alteration of sensations as late effects of stroke (HCC) 01/26/2013   Unsteady gait 01/26/2013   Dizziness and giddiness 01/26/2013    ONSET DATE: 2009  REFERRING DIAG: Z86.73 (ICD-10-CM) - History of stroke   THERAPY DIAG:  Muscle weakness (generalized)  Difficulty in walking, not elsewhere classified  Other abnormalities of gait and mobility  Unsteadiness on feet  Abnormality of gait and mobility  Rationale for Evaluation and Treatment: Rehabilitation  SUBJECTIVE:  SUBJECTIVE STATEMENT:   Pt reports no significant changes since last session.   Pt accompanied by: self  PERTINENT HISTORY: Pt has had one major stroke and 5 mini strokes since 2009. Pt is here since her doctor wants her to work on her strength and balance and she also wants to improve in these categories. Pt has hemianopsia with blindness inferior, left side and superior.  Pt initially had stroke in 2009 and was previously able to walk for long distances. Pt has 650 feet driveway that is gravel and used to walk up and down with the cane. Pt has issues with depth perception. Pt is unable to tell angles of concrete and holes in the ground. Pt has service dogs that are retired, one for guidance and one for helping with picking up and retrieving items. Pt had most recent stroke in January 2023 that further limited her field of vision.  Patient wants to work diligently on her strength, balance, mobility, and in order to improve her ability to ambulate on various surfaces in various scenarios.  Patient wanted to come to outpatient services to utilize equipment to improve her strength and a greater capacity than home health was able to assist her with.  She is doing home exercise program involving mostly standing with upper extremity support and activities to improve her strength.  Patient reports she is very nervous to ambulate without contact-guard assist and gait belt without being able to hold onto a wall or steady surface in order to provide her with tactile cues for the areas around her.  PAIN:  Are you having pain? None  PRECAUTIONS: Fall, hemianopsia  WEIGHT BEARING RESTRICTIONS: No  FALLS: Has patient fallen in last 6 months? No  PATIENT GOALS: Improve gait, improve strength, walk on unsteady surfaces  OBJECTIVE:   TODAY'S TREATMENT:                                                                                                                              DATE: 02/02/23  -Pt asks to discuss current goals and her concerns, anxieties, limitations regarding mobility potential -AMB from rehab gym to elevator c RUE HW, minguarg assist, no LOB 12 minutes 46 seconds -elevator ride up -pt selects floor button, then rides down -AMB back to rehab gym, cues to make negative split    PATIENT EDUCATION: Education details:Pt educated throughout session about proper  posture and technique with exercises. Improved exercise technique, movement at target joints, use of target muscles after min to mod verbal, visual, tactile cues.  Person educated: Patient Education method: Explanation Education comprehension: verbalized understanding  HOME EXERCISE PROGRAM:  Access Code: [email or web address] URL: [email or web address] Date: 11/24/2022 Prepared by: Thresa Ross  Exercises - Alternating Step Taps with Counter Support  - 7 x weekly - 2 sets - 10 reps - Sit to Stand  - 1 x daily - 7 x weekly - 2 sets - 10 reps - Standing Tandem Balance with Counter  Support  - 1 x daily - 3 x weekly - 2 sets - 2 reps - 10 hold - Standing Single Leg Stance with Counter Support  - 1 x daily - 3 x weekly - 2 sets - 2 reps - 5 hold - Seated Long Arc Quad with Ankle Weight  - 1 x daily - 7 x weekly - 2 sets - 10 reps  Previous: Pt performs leg lifts and squats with UE support    GOALS: Goals reviewed with patient? Yes  SHORT TERM GOALS: Target date: 06/24/2022    Patient will be independent in home exercise program to improve strength/mobility for better functional independence with ADLs. Baseline: No HEP currently 9/12: pt completing HEP regularly.  Goal status: MET  LONG TERM GOALS: Target date: 01/29/2023    1.  Patient (> 79 years old) will complete five times sit to stand test in < 15 seconds indicating an increased LE strength and improved balance. Baseline:  Test second visit ; 05/29/2022: 40 sec pull to stance RUE 07/10/22: 22.82 sec. 08/18/22: 16.36 sec with UE support 09/29/22:19.32 sec, 42 sec without UE assist  9/9:19.86 sec no UE assist  Goal status: ONGOING/ PROGRESSING  2.  Patient will increase FOTO score by 8 or more points  to demonstrate statistically significant improvement in mobility and quality of life.  Baseline: Test second visit; 05/28/2040: 45; 07/10/22: 41, 08/18/22: 45 8/5:48;  Goal status: NOT MET   3.  Patient will increase 10 meter walk  test to >.100m/s as to improve gait speed for better community ambulation and to reduce fall risk. Baseline: Test visit 2 06/19/22: 53 sec 5/16: 77 sec. . 8/5:46 sec with SPC, 33 sec with RW; 9/12: 37.66 sec, 24 sec with 3WW; 02/02/23: 0.12m/s  Goal status: NOT MET   4. Patient will improve ability to ambulate on uneven surfaces as evidenced by ability to ambulate over simulated uneven terrain in clinic to improve independence with ambulation. 9/12: pt ambulates over red mat and blue mat without LOB, still not confident on red mat; 02/02/23: pt does not wish to continue to work on this goal, no longer meaningful Goal status: MET  5.  Pt will ascend and descend 4 stairs with unilateral UE assist and with step through pattern of gait and without any sign of imbalance to improve her community mobility.  Baseline: Pt has R lateral lean with ascending and descending and has LLE internal rotation of foot when stepping down. 02/02/23: continued LLE impairments and presence of rigid AFO discount any possibility of a step-over-step pattern for stairs navigation.  Goal status: NOT MET   6.  Pt will step on and off of curb or simulated curb using SPC in order to allow for improved community mobility.  Baseline: Unable, attempted 9/12; 02/02/23: Pt does not feel this is relevant, does not feel safe AMB overground with device on flat surfaces. Goal status: NOT MET       ASSESSMENT:  CLINICAL IMPRESSION:  Pt brings up a lot of critical feedback in session explaining areas of frustration with current challenges in mobility, her personal opinions of the multifactorial barriers to progressing much farther. Despite time spent on sequencing and learning new gait patterns pt remains more concerned with sustaining any future falls and expresses a desire to stick to the gait sequencing patterns that is most familiar-pt feels that her confidence in new pattern remains minimal and it has been a barrier to practicing gait  on her own at  home. Pt also discusses limitations with the degree of support and assist for practice time at home. Pt is confiden tin choosing her more appropriate AD for mobility based on situation, however her visual limitations, ergonomics, focus challenges, caregiver capacity, and falls anxiety all factor in the ultimate decision. Pt asks to spend time AMB today with her most familiar pattern, which is permitted for the purposes of demonstration of fluency, safety, and efficacy. Pt shows adequate control of balance at her self selected speed and is more confident in her safety- Pt has consistent energy in session, but her limited gait speed makes obstacles such as door timing and elevator boarding/offboarding a real challenge. Pt has likely made most of the progress toward goals that she feels is relevant at this time, will begin to work on planning for DC toward independent self management in coming weeks. Pt will continue to benefit from skilled physical therapy intervention to address impairments, improve QOL, and attain therapy goals.     OBJECTIVE IMPAIRMENTS: Abnormal gait, decreased activity tolerance, decreased balance, decreased endurance, decreased strength, decreased safety awareness, hypomobility, impaired flexibility, and impaired UE functional use.   ACTIVITY LIMITATIONS: lifting, bending, standing, stairs, transfers, bed mobility, and locomotion level  PARTICIPATION LIMITATIONS: meal prep, cleaning, shopping, and community activity  PERSONAL FACTORS: Time since onset of injury/illness/exacerbation and 3+ comorbidities: diabetes, HTN, Multiple previous strokes   are also affecting patient's functional outcome.   REHAB POTENTIAL: Fair secondary to time since onset and severely of visual deficits and L UE funcitonal use   CLINICAL DECISION MAKING: Stable/uncomplicated  EVALUATION COMPLEXITY: Low  PLAN:  PT FREQUENCY: 1-2x/week   PT DURATION: 3 weeks   PLANNED INTERVENTIONS:  Therapeutic exercises, Therapeutic activity, Neuromuscular re-education, Balance training, Gait training, Patient/Family education, Self Care, Joint mobilization, Stair training, Moist heat, Manual therapy, and Re-evaluation  PLAN FOR NEXT SESSION:  Begin DC planning conversation for next 3 weeks.   8:11 PM, 02/02/23 Rosamaria Lints PT ,DPT Physical Therapist- [place]  [facility]

## 2023-02-04 ENCOUNTER — Ambulatory Visit: Payer: PPO | Admitting: Physical Therapy

## 2023-02-09 ENCOUNTER — Ambulatory Visit: Payer: PPO | Admitting: Physical Therapy

## 2023-02-09 ENCOUNTER — Ambulatory Visit: Payer: PPO

## 2023-02-09 DIAGNOSIS — M6281 Muscle weakness (generalized): Secondary | ICD-10-CM | POA: Diagnosis not present

## 2023-02-09 DIAGNOSIS — R2689 Other abnormalities of gait and mobility: Secondary | ICD-10-CM

## 2023-02-09 DIAGNOSIS — R262 Difficulty in walking, not elsewhere classified: Secondary | ICD-10-CM

## 2023-02-09 DIAGNOSIS — R269 Unspecified abnormalities of gait and mobility: Secondary | ICD-10-CM

## 2023-02-09 DIAGNOSIS — R2681 Unsteadiness on feet: Secondary | ICD-10-CM

## 2023-02-09 NOTE — Therapy (Signed)
OUTPATIENT PHYSICAL THERAPY TREATMENT   Patient Name: [name] MRN: [id number] DOB:03/21/1950, 72 y.o., female Today's Date: 02/09/2023   PCP:  Enid Baas, MD   REFERRING PROVIDER: Morene Crocker, MD   END OF SESSION:  PT End of Session - 02/09/23 1522     Visit Number 51    Number of Visits 64    Date for PT Re-Evaluation 02/24/23    Authorization Type [employer]    Authorization Time Period 02/02/23-02/24/23    Progress Note Due on Visit 60    Equipment Utilized During Treatment Gait belt    Activity Tolerance Patient tolerated treatment well;No increased pain    Behavior During Therapy [facility] for tasks assessed/performed                 Past Medical History:  Diagnosis Date   Abdominal pain 12/06/2013   Abnormality of gait 01/26/2013   Altered bowel habits 12/11/2017   Bruises easily    Constipation    Crohn's disease (HCC)    Depression    Dizziness and giddiness 01/26/2013   Fecal incontinence alternating with constipation 12/11/2017   GERD (gastroesophageal reflux disease)    Hemianopia    left   Hemiparesis and alteration of sensations as late effects of stroke (HCC) 01/26/2013   Hyperlipidemia    Hypertension    Hypothyroidism    Lump in female breast    Panic disorder    PONV (postoperative nausea and vomiting)    Stroke (HCC) 2009   Tremor, essential 04/24/2020   Type II diabetes mellitus (HCC)    Past Surgical History:  Procedure Laterality Date   ANAL FISSURE REPAIR  2008   APPENDECTOMY  1986   BREAST BIOPSY Left 2006   neg   BREAST LUMPECTOMY Right 1990's   CESAREAN SECTION  1981; 1983   CHOLECYSTECTOMY  1986   SMALL INTESTINE SURGERY  05/1991   TUBAL LIGATION  1983   Patient Active Problem List   Diagnosis Date Noted   Bone spur 06/13/2021   Acute CVA (cerebrovascular accident) (HCC) 03/07/2021   CVA (cerebral vascular accident) (HCC) 11/01/2020   Chronic pain 04/25/2020   Chronic constipation  04/25/2020   Gastro-esophageal reflux disease without esophagitis 04/25/2020   Hardening of the aorta (main artery of the heart) (HCC) 04/25/2020   History of iron deficiency anemia 04/25/2020   Hypercholesteremia 04/25/2020   Hypertension 04/25/2020   Personal history of transient ischemic attack (TIA), and cerebral infarction without residual deficits 04/25/2020   Pulmonary emphysema (HCC) 04/25/2020   Vitamin D deficiency 04/25/2020   Tremor, essential 04/24/2020   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  Rationale for Evaluation and Treatment: Rehabilitation  SUBJECTIVE:  SUBJECTIVE STATEMENT:  Pt reports a fall occurrence this morning. Pt was squatting down to pick something up and lost her balance and ended up falling on her buttocks. She reports this was not a far fall as it was a deep squat. Pt report she would like to work toward going up and down ramps and curb cuts.  Patient reports her words may have been misinterpreted at the last session but she still is motivated to continue to improve her gait speed, her mobility on uneven surfaces, and her ability to navigate curbs in the community.  Patient acknowledges importance of all of these towards her daily life.   Pt accompanied by:  self  PERTINENT HISTORY: Pt has had one major stroke and 5 mini strokes since 2009. Pt is here since her doctor wants her to work on her strength and balance and she also wants to improve in these categories. Pt has hemianopsia with blindness inferior, left side and superior. Pt initially had stroke in 2009 and was previously able to walk for long distances. Pt has 650 feet driveway that is gravel and used to walk up and down with the cane. Pt has issues with depth perception. Pt is unable to tell angles of concrete and holes in the ground. Pt has service dogs that are retired, one for guidance and one for helping with picking up and retrieving items. Pt had most recent stroke in January 2023 that further limited her field of vision.  Patient wants to work diligently on her strength, balance, mobility, and in order to improve her ability to ambulate on various surfaces in various scenarios.  Patient wanted to come to outpatient services to utilize equipment to improve her strength and a greater capacity than home health was able to assist her with.  She is doing home exercise program involving mostly standing with upper extremity support and activities to improve her strength.  Patient reports she is very nervous to ambulate without contact-guard assist and gait belt without being able to hold onto a wall or steady surface in order to provide her with tactile cues for the areas around her.  PAIN:  Are you having pain? None  PRECAUTIONS: Fall, hemianopsia  WEIGHT BEARING RESTRICTIONS: No  FALLS: Has patient fallen in last 6 months? No  PATIENT GOALS: Improve gait, improve strength, walk on unsteady surfaces  OBJECTIVE:   TODAY'S TREATMENT:                                                                                                                              DATE: 02/09/23  Spent initial portion of the session discussing patient's last session where her goals were assessed.  Patient reports  that she would still like to work towards a lot of these goals but they need to be adjusted based on her new goal of using a hemiwalker compared to an straight point cane as her assistive device of choice.  NMR  Ambulation in rehab gym with hemiwalker x 160 feet.  Intermittent step through pattern of gait but not full step through pattern.  Patient able to maintain improved step through pattern of gait in nondistracting and straight line environments but reverts to step to gait with turns or around other individuals if they are walking by.  Transportation to hallway between medical mall and [facility] to work on ambulating on incline.  Perform repetition of ascending the incline and 1 rotation descending on the decline.  No loss of balance noted patient was utilizing a step to pattern of gait throughout.  Will continue to work on this in future sessions to improve her confidence and efficiency with this.   PATIENT EDUCATION: Education details:Pt educated throughout session about proper posture and technique with exercises. Improved exercise technique, movement at target joints, use of target muscles after min to mod verbal, visual, tactile cues.  Person educated: Patient Education method: Explanation Education comprehension: verbalized understanding  HOME EXERCISE PROGRAM:  Access Code: [email or web address] URL: [email or web address] Date: 11/24/2022 Prepared by: Thresa Ross  Exercises - Alternating Step Taps with Counter Support  - 7 x weekly - 2 sets - 10 reps - Sit to Stand  - 1 x daily - 7 x weekly - 2 sets - 10 reps - Standing Tandem Balance with Counter Support  - 1 x daily - 3 x weekly - 2 sets - 2 reps - 10 hold - Standing Single Leg Stance with Counter Support  - 1 x daily - 3 x weekly - 2 sets - 2 reps - 5 hold - Seated Long Arc Quad with Ankle Weight  - 1 x daily - 7 x weekly - 2 sets - 10 reps  Previous: Pt performs leg lifts and squats with UE support     GOALS: Goals reviewed with patient? Yes  SHORT TERM GOALS: Target date: 06/24/2022    Patient will be independent in home exercise program to improve strength/mobility for better functional independence with ADLs. Baseline: No HEP currently 9/12: pt completing HEP regularly.  Goal status: MET  LONG TERM GOALS: Target date: 01/29/2023    1.  Patient (> 42 years old) will complete five times sit to stand test in < 15 seconds indicating an increased LE strength and improved balance. Baseline:  Test second visit ; 05/29/2022: 40 sec pull to stance RUE 07/10/22: 22.82 sec. 08/18/22: 16.36 sec with UE support 09/29/22:19.32 sec, 42 sec without UE assist  9/9:19.86 sec no UE assist  Goal status: ONGOING/ PROGRESSING  2.  Patient will increase FOTO score by 8 or more points  to demonstrate statistically significant improvement in mobility and quality of life.  Baseline: Test second visit; 05/28/2040: 45; 07/10/22: 41, 08/18/22: 45 8/5:48;  Goal status: NOT MET   3.  Patient will increase 10 meter walk test to >.74m/s as to improve gait speed for better community ambulation and to reduce fall risk. Baseline: Test visit 2 06/19/22: 53 sec 5/16: 77 sec. . 8/5:46 sec with SPC, 33 sec with RW; 9/12: 37.66 sec, 24 sec with 3WW; 02/02/23: 0.20m/s  Goal status: NOT MET   4. Patient will improve ability to ambulate on uneven surfaces as evidenced by ability to ambulate over simulated uneven terrain in clinic to improve independence with ambulation. 9/12: pt ambulates over red mat and blue mat without LOB, still not confident on red mat; 02/02/23: pt does not wish to continue to work on this goal, no longer meaningful 12/16: Patient still feels  is important to work on ambulating on uneven surfaces but mostly surfaces including inclines, declines, and other solid but uneven surfaces.  She reports needs to do this in order to ambulate out of her house as she has a ramp she is currently unable to do this safely and  with confidence. Goal status: MET  5.  Pt will ascend and descend 4 stairs with unilateral UE assist and with step through pattern of gait and without any sign of imbalance to improve her community mobility.  Baseline: Pt has R lateral lean with ascending and descending and has LLE internal rotation of foot when stepping down. 02/02/23: continued LLE impairments and presence of rigid AFO discount any possibility of a step-over-step pattern for stairs navigation.  Goal status: NOT MET   6.  Pt will step on and off of curb or simulated curb using SPC in order to allow for improved community mobility.  Baseline: Unable, attempted 9/12; 02/02/23: Pt does not feel this is relevant, does not feel safe AMB overground with device on flat surfaces. 12/16: Patient feels it is more relevant for her to utilize a hemiwalker but does still feel she wants to have the ability to step on and off a curb or at least a curb cut in a handicap type space Goal status: NOT MET       ASSESSMENT:  CLINICAL IMPRESSION:  Patient presents to therapy with good motivation for completion of activities.  Physical therapist and patient discussed patient's current plan of care and patient reports she still wants to be able to ambulate improved speed's number to improve her ability to navigate in her community, she still feels like she needs to improve her ability to ambulate up and down curbs or "curb cuts "in order to improve her mobility within the community.  Patient also to work on her ambulation and gait speed in order to improve her ability significantly community without being a hindrance to those around her. Pt will continue to benefit from skilled physical therapy intervention to address impairments, improve QOL, and attain therapy goals.    OBJECTIVE IMPAIRMENTS: Abnormal gait, decreased activity tolerance, decreased balance, decreased endurance, decreased strength, decreased safety awareness, hypomobility, impaired  flexibility, and impaired UE functional use.   ACTIVITY LIMITATIONS: lifting, bending, standing, stairs, transfers, bed mobility, and locomotion level  PARTICIPATION LIMITATIONS: meal prep, cleaning, shopping, and community activity  PERSONAL FACTORS: Time since onset of injury/illness/exacerbation and 3+ comorbidities: diabetes, HTN, Multiple previous strokes   are also affecting patient's functional outcome.   REHAB POTENTIAL: Fair secondary to time since onset and severely of visual deficits and L UE funcitonal use   CLINICAL DECISION MAKING: Stable/uncomplicated  EVALUATION COMPLEXITY: Low  PLAN:  PT FREQUENCY: 1-2x/week   PT DURATION: 3 weeks   PLANNED INTERVENTIONS: Therapeutic exercises, Therapeutic activity, Neuromuscular re-education, Balance training, Gait training, Patient/Family education, Self Care, Joint mobilization, Stair training, Moist heat, Manual therapy, and Re-evaluation  PLAN FOR NEXT SESSION:  Review right goals to make them specific towards hemiwalker and improving her efficiency and safety with ambulation with this and submit  a new certification  3:36 PM, 02/09/23 Norman Herrlich PT ,DPT Physical Therapist- [place]  [facility]

## 2023-02-11 ENCOUNTER — Ambulatory Visit: Payer: PPO | Admitting: Physical Therapy

## 2023-02-11 ENCOUNTER — Ambulatory Visit: Payer: PPO

## 2023-02-11 DIAGNOSIS — R2681 Unsteadiness on feet: Secondary | ICD-10-CM

## 2023-02-11 DIAGNOSIS — M6281 Muscle weakness (generalized): Secondary | ICD-10-CM

## 2023-02-11 DIAGNOSIS — R269 Unspecified abnormalities of gait and mobility: Secondary | ICD-10-CM

## 2023-02-11 DIAGNOSIS — R2689 Other abnormalities of gait and mobility: Secondary | ICD-10-CM

## 2023-02-11 DIAGNOSIS — R262 Difficulty in walking, not elsewhere classified: Secondary | ICD-10-CM

## 2023-02-11 NOTE — Therapy (Unsigned)
OUTPATIENT PHYSICAL THERAPY TREATMENT   Patient Name: [name] MRN: [id number] DOB:30-Jun-1950, 72 y.o., female Today's Date: 02/11/2023   PCP:  Enid Baas, MD   REFERRING PROVIDER: Morene Crocker, MD   END OF SESSION:  PT End of Session - 02/11/23 1515     Visit Number 52    Number of Visits 64    Date for PT Re-Evaluation 02/24/23    Authorization Type [employer]    Authorization Time Period 02/02/23-02/24/23    Progress Note Due on Visit 60    PT Start Time 1532    PT Stop Time 1615    PT Time Calculation (min) 43 min    Equipment Utilized During Treatment Gait belt    Activity Tolerance Patient tolerated treatment well;No increased pain    Behavior During Therapy [facility] for tasks assessed/performed                 Past Medical History:  Diagnosis Date   Abdominal pain 12/06/2013   Abnormality of gait 01/26/2013   Altered bowel habits 12/11/2017   Bruises easily    Constipation    Crohn's disease (HCC)    Depression    Dizziness and giddiness 01/26/2013   Fecal incontinence alternating with constipation 12/11/2017   GERD (gastroesophageal reflux disease)    Hemianopia    left   Hemiparesis and alteration of sensations as late effects of stroke (HCC) 01/26/2013   Hyperlipidemia    Hypertension    Hypothyroidism    Lump in female breast    Panic disorder    PONV (postoperative nausea and vomiting)    Stroke (HCC) 2009   Tremor, essential 04/24/2020   Type II diabetes mellitus (HCC)    Past Surgical History:  Procedure Laterality Date   ANAL FISSURE REPAIR  2008   APPENDECTOMY  1986   BREAST BIOPSY Left 2006   neg   BREAST LUMPECTOMY Right 1990's   CESAREAN SECTION  1981; 1983   CHOLECYSTECTOMY  1986   SMALL INTESTINE SURGERY  05/1991   TUBAL LIGATION  1983   Patient Active Problem List   Diagnosis Date Noted   Bone spur 06/13/2021   Acute CVA (cerebrovascular accident) (HCC) 03/07/2021   CVA (cerebral vascular  accident) (HCC) 11/01/2020   Chronic pain 04/25/2020   Chronic constipation 04/25/2020   Gastro-esophageal reflux disease without esophagitis 04/25/2020   Hardening of the aorta (main artery of the heart) (HCC) 04/25/2020   History of iron deficiency anemia 04/25/2020   Hypercholesteremia 04/25/2020   Hypertension 04/25/2020   Personal history of transient ischemic attack (TIA), and cerebral infarction without residual deficits 04/25/2020   Pulmonary emphysema (HCC) 04/25/2020   Vitamin D deficiency 04/25/2020   Tremor, essential 04/24/2020   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  Rationale for Evaluation and Treatment: Rehabilitation  SUBJECTIVE:  SUBJECTIVE STATEMENT:  Pt reports doing well today. Pt denies any recent falls/stumbles since prior session. Pt denies any updates to medications or medical appointment since prior session.    Pt accompanied by: self  PERTINENT HISTORY: Pt has had one major stroke and 5 mini strokes since 2009. Pt is here since her doctor wants her to work on her strength and balance and she also wants to improve in these categories. Pt has hemianopsia with blindness inferior, left side and superior. Pt initially had stroke in 2009 and was previously able to  walk for long distances. Pt has 650 feet driveway that is gravel and used to walk up and down with the cane. Pt has issues with depth perception. Pt is unable to tell angles of concrete and holes in the ground. Pt has service dogs that are retired, one for guidance and one for helping with picking up and retrieving items. Pt had most recent stroke in January 2023 that further limited her field of vision.  Patient wants to work diligently on her strength, balance, mobility, and in order to improve her ability to ambulate on various surfaces in various scenarios.  Patient wanted to come to outpatient services to utilize equipment to improve her strength and a greater capacity than home health was able to assist her with.  She is doing home exercise program involving mostly standing with upper extremity support and activities to improve her strength.  Patient reports she is very nervous to ambulate without contact-guard assist and gait belt without being able to hold onto a wall or steady surface in order to provide her with tactile cues for the areas around her.  PAIN:  Are you having pain? None  PRECAUTIONS: Fall, hemianopsia  WEIGHT BEARING RESTRICTIONS: No  FALLS: Has patient fallen in last 6 months? No  PATIENT GOALS: Improve gait, improve strength, walk on unsteady surfaces  OBJECTIVE:   TODAY'S TREATMENT:                                                                                                                              DATE: 02/11/23  Spent all of session ambulating outside and functional relevant areas.  X 2 repetitions ambulating up and down a approximately 25 to 30 foot incline.  Cues for proper distance from side-to-side walk due to patient's vision impairments.  Rest between sets.  Between sets completed 8 times sit to stand without upper extremity assist.  Patient did require min assist on a few repetitions and contact-guard assist for balance preventing anterior weight shift  losing her balance.  Following these transition to a handicap accessible curb and practiced with getting out of wheelchair ambulating up and down curb and then back to wheelchair.  Patient takes increased time with this for processing as well as understanding the inclines of the environment.  Practicing this for repetitions with getting in from handicap parking spaces.   PATIENT EDUCATION: Education details:Pt educated throughout session  about proper posture and technique with exercises. Improved exercise technique, movement at target joints, use of target muscles after min to mod verbal, visual, tactile cues.  Person educated: Patient Education method: Explanation Education comprehension: verbalized understanding  HOME EXERCISE PROGRAM:  Access Code: [email or web address] URL: [email or web address] Date: 11/24/2022 Prepared by: Thresa Ross  Exercises - Alternating Step Taps with Counter Support  - 7 x weekly - 2 sets - 10 reps - Sit to Stand  - 1 x daily - 7 x weekly - 2 sets - 10 reps - Standing Tandem Balance with Counter Support  - 1 x daily - 3 x weekly - 2 sets - 2 reps - 10 hold - Standing Single Leg Stance with Counter Support  - 1 x daily - 3 x weekly - 2 sets - 2 reps - 5 hold - Seated Long Arc Quad with Ankle Weight  - 1 x daily - 7 x weekly - 2 sets - 10 reps  Previous: Pt performs leg lifts and squats with UE support    GOALS: Goals reviewed with patient? Yes  SHORT TERM GOALS: Target date: 06/24/2022    Patient will be independent in home exercise program to improve strength/mobility for better functional independence with ADLs. Baseline: No HEP currently 9/12: pt completing HEP regularly.  Goal status: MET  LONG TERM GOALS: Target date: 01/29/2023    1.  Patient (> 55 years old) will complete five times sit to stand test in < 15 seconds indicating an increased LE strength and improved balance. Baseline:  Test second visit ; 05/29/2022: 40 sec pull to stance  RUE 07/10/22: 22.82 sec. 08/18/22: 16.36 sec with UE support 09/29/22:19.32 sec, 42 sec without UE assist  9/9:19.86 sec no UE assist  Goal status: ONGOING/ PROGRESSING  2.  Patient will increase FOTO score by 8 or more points  to demonstrate statistically significant improvement in mobility and quality of life.  Baseline: Test second visit; 05/28/2040: 45; 07/10/22: 41, 08/18/22: 45 8/5:48;  Goal status: NOT MET   3.  Patient will increase 10 meter walk test to >.54m/s as to improve gait speed for better community ambulation and to reduce fall risk. Baseline: Test visit 2 06/19/22: 53 sec 5/16: 77 sec. . 8/5:46 sec with SPC, 33 sec with RW; 9/12: 37.66 sec, 24 sec with 3WW; 02/02/23: 0.74m/s  Goal status: NOT MET   4. Patient will improve ability to ambulate on uneven surfaces as evidenced by ability to ambulate over simulated uneven terrain in clinic to improve independence with ambulation. 9/12: pt ambulates over red mat and blue mat without LOB, still not confident on red mat; 02/02/23: pt does not wish to continue to work on this goal, no longer meaningful 12/16: Patient still feels is important to work on ambulating on uneven surfaces but mostly surfaces including inclines, declines, and other solid but uneven surfaces.  She reports needs to do this in order to ambulate out of her house as she has a ramp she is currently unable to do this safely and with confidence. Goal status: MET  5.  Pt will ascend and descend 4 stairs with unilateral UE assist and with step through pattern of gait and without any sign of imbalance to improve her community mobility.  Baseline: Pt has R lateral lean with ascending and descending and has LLE internal rotation of foot when stepping down. 02/02/23: continued LLE impairments and presence of rigid AFO discount any possibility of a step-over-step pattern for stairs navigation.  Goal  status: NOT MET   6.  Pt will step on and off of curb or simulated curb using SPC in order  to allow for improved community mobility.  Baseline: Unable, attempted 9/12; 02/02/23: Pt does not feel this is relevant, does not feel safe AMB overground with device on flat surfaces. 12/16: Patient feels it is more relevant for her to utilize a hemiwalker but does still feel she wants to have the ability to step on and off a curb or at least a curb cut in a handicap type space Goal status: NOT MET       ASSESSMENT:  CLINICAL IMPRESSION:  Patient presents with good motivation for completion of physical therapy activities.  Patient practices with functional environments on sidewalks outside as well as practices with getting in and out of handicap spaces while ambulating.  Patient still takes increased time was able to complete this safely.  Will continue to practice this in future sessions as the weather permits.Pt will continue to benefit from skilled physical therapy intervention to address impairments, improve QOL, and attain therapy goals.     OBJECTIVE IMPAIRMENTS: Abnormal gait, decreased activity tolerance, decreased balance, decreased endurance, decreased strength, decreased safety awareness, hypomobility, impaired flexibility, and impaired UE functional use.   ACTIVITY LIMITATIONS: lifting, bending, standing, stairs, transfers, bed mobility, and locomotion level  PARTICIPATION LIMITATIONS: meal prep, cleaning, shopping, and community activity  PERSONAL FACTORS: Time since onset of injury/illness/exacerbation and 3+ comorbidities: diabetes, HTN, Multiple previous strokes   are also affecting patient's functional outcome.   REHAB POTENTIAL: Fair secondary to time since onset and severely of visual deficits and L UE funcitonal use   CLINICAL DECISION MAKING: Stable/uncomplicated  EVALUATION COMPLEXITY: Low  PLAN:  PT FREQUENCY: 1-2x/week   PT DURATION: 3 weeks   PLANNED INTERVENTIONS: Therapeutic exercises, Therapeutic activity, Neuromuscular re-education, Balance  training, Gait training, Patient/Family education, Self Care, Joint mobilization, Stair training, Moist heat, Manual therapy, and Re-evaluation  PLAN FOR NEXT SESSION:  Review right goals to make them specific towards hemiwalker and improving her efficiency and safety with ambulation with this and submit  a new certification  3:16 PM, 02/11/23 Norman Herrlich PT ,DPT Physical Therapist- [place]  [facility]

## 2023-02-14 DIAGNOSIS — E1142 Type 2 diabetes mellitus with diabetic polyneuropathy: Secondary | ICD-10-CM | POA: Diagnosis not present

## 2023-02-15 ENCOUNTER — Other Ambulatory Visit: Payer: Self-pay

## 2023-02-16 ENCOUNTER — Ambulatory Visit: Payer: PPO | Admitting: Physical Therapy

## 2023-02-17 ENCOUNTER — Other Ambulatory Visit: Payer: Self-pay

## 2023-02-20 ENCOUNTER — Other Ambulatory Visit: Payer: Self-pay

## 2023-02-20 DIAGNOSIS — J431 Panlobular emphysema: Secondary | ICD-10-CM | POA: Diagnosis not present

## 2023-02-20 DIAGNOSIS — Z794 Long term (current) use of insulin: Secondary | ICD-10-CM | POA: Diagnosis not present

## 2023-02-20 DIAGNOSIS — E119 Type 2 diabetes mellitus without complications: Secondary | ICD-10-CM | POA: Diagnosis not present

## 2023-02-20 DIAGNOSIS — K5010 Crohn's disease of large intestine without complications: Secondary | ICD-10-CM | POA: Diagnosis not present

## 2023-02-20 DIAGNOSIS — E7800 Pure hypercholesterolemia, unspecified: Secondary | ICD-10-CM | POA: Diagnosis not present

## 2023-02-20 DIAGNOSIS — E039 Hypothyroidism, unspecified: Secondary | ICD-10-CM | POA: Diagnosis not present

## 2023-02-20 DIAGNOSIS — Z0000 Encounter for general adult medical examination without abnormal findings: Secondary | ICD-10-CM | POA: Diagnosis not present

## 2023-02-20 DIAGNOSIS — I69051 Hemiplegia and hemiparesis following nontraumatic subarachnoid hemorrhage affecting right dominant side: Secondary | ICD-10-CM | POA: Diagnosis not present

## 2023-02-20 MED ORDER — GABAPENTIN 400 MG PO CAPS
400.0000 mg | ORAL_CAPSULE | Freq: Three times a day (TID) | ORAL | 1 refills | Status: DC
  Filled 2023-02-20: qty 270, 90d supply, fill #0

## 2023-02-23 ENCOUNTER — Ambulatory Visit: Payer: PPO

## 2023-02-23 DIAGNOSIS — M6281 Muscle weakness (generalized): Secondary | ICD-10-CM

## 2023-02-23 DIAGNOSIS — R262 Difficulty in walking, not elsewhere classified: Secondary | ICD-10-CM

## 2023-02-23 DIAGNOSIS — R2681 Unsteadiness on feet: Secondary | ICD-10-CM

## 2023-02-23 DIAGNOSIS — R2689 Other abnormalities of gait and mobility: Secondary | ICD-10-CM

## 2023-02-23 NOTE — Therapy (Signed)
OUTPATIENT PHYSICAL THERAPY TREATMENT   Patient Name: [name] MRN: [id number] DOB:[birth year], 72 y.o., female Today's Date: 02/23/2023   PCP:  Enid Baas, MD   REFERRING PROVIDER: Morene Crocker, MD   END OF SESSION:  PT End of Session - 02/23/23 1357     Visit Number 53    Number of Visits 64    Date for PT Re-Evaluation 02/24/23    Authorization Type [employer]    Authorization Time Period 02/02/23-02/24/23    Progress Note Due on Visit 60    PT Start Time 1400    PT Stop Time 1443    PT Time Calculation (min) 43 min    Equipment Utilized During Treatment Gait belt    Activity Tolerance Patient tolerated treatment well;No increased pain    Behavior During Therapy [facility] for tasks assessed/performed               Past Medical History:  Diagnosis Date   Abdominal pain 12/06/2013   Abnormality of gait 01/26/2013   Altered bowel habits 12/11/2017   Bruises easily    Constipation    Crohn's disease (HCC)    Depression    Dizziness and giddiness 01/26/2013   Fecal incontinence alternating with constipation 12/11/2017   GERD (gastroesophageal reflux disease)    Hemianopia    left   Hemiparesis and alteration of sensations as late effects of stroke (HCC) 01/26/2013   Hyperlipidemia    Hypertension    Hypothyroidism    Lump in female breast    Panic disorder    PONV (postoperative nausea and vomiting)    Stroke (HCC) 2009   Tremor, essential 04/24/2020   Type II diabetes mellitus (HCC)    Past Surgical History:  Procedure Laterality Date   ANAL FISSURE REPAIR  2008   APPENDECTOMY  1986   BREAST BIOPSY Left 2006   neg   BREAST LUMPECTOMY Right 1990's   CESAREAN SECTION  1981; 1983   CHOLECYSTECTOMY  1986   SMALL INTESTINE SURGERY  05/1991   TUBAL LIGATION  1983   Patient Active Problem List   Diagnosis Date Noted   Bone spur 06/13/2021   Acute CVA (cerebrovascular accident) (HCC) 03/07/2021   CVA (cerebral vascular  accident) (HCC) 11/01/2020   Chronic pain 04/25/2020   Chronic constipation 04/25/2020   Gastro-esophageal reflux disease without esophagitis 04/25/2020   Hardening of the aorta (main artery of the heart) (HCC) 04/25/2020   History of iron deficiency anemia 04/25/2020   Hypercholesteremia 04/25/2020   Hypertension 04/25/2020   Personal history of transient ischemic attack (TIA), and cerebral infarction without residual deficits 04/25/2020   Pulmonary emphysema (HCC) 04/25/2020   Vitamin D deficiency 04/25/2020   Tremor, essential 04/24/2020   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   Right lower quadrant abdominal pain 08/18/2018   Spastic hemiplegia affecting nondominant side (HCC) 02/03/2014   Diabetes mellitus (HCC) 09/07/2013   Hypothyroidism 09/07/2013   Type 2 diabetes mellitus without complications (HCC) 09/07/2013   Hemiparesis and alteration of sensations as late effects of stroke (HCC) 01/26/2013   Unsteady gait 01/26/2013   Dizziness and giddiness 01/26/2013    ONSET DATE: 2009  REFERRING DIAG: Z86.73 (ICD-10-CM) - History of stroke   THERAPY DIAG:  Muscle weakness (generalized)  Difficulty in walking, not elsewhere classified  Other abnormalities of gait and mobility  Unsteadiness on feet  Rationale for Evaluation and Treatment: Rehabilitation  SUBJECTIVE:  SUBJECTIVE STATEMENT:   Pt reports doing well today. Pt denies any recent falls/stumbles since prior session. Pt denies any updates to medications or medical appointment since prior session. Feeling unmotivated due to emotional attachment to Nada Boozer (father and mother died at Christmas time).    Pt accompanied by: self  PERTINENT HISTORY: Pt has had one major stroke and 5 mini strokes since 2009. Pt is here since her  doctor wants her to work on her strength and balance and she also wants to improve in these categories. Pt has hemianopsia with blindness inferior, left side and superior. Pt initially had stroke in 2009 and was previously able to walk for long distances. Pt has 650 feet driveway that is gravel and used to walk up and down with the cane. Pt has issues with depth perception. Pt is unable to tell angles of concrete and holes in the ground. Pt has service dogs that are retired, one for guidance and one for helping with picking up and retrieving items. Pt had most recent stroke in January 2023 that further limited her field of vision.  Patient wants to work diligently on her strength, balance, mobility, and in order to improve her ability to ambulate on various surfaces in various scenarios.  Patient wanted to come to outpatient services to utilize equipment to improve her strength and a greater capacity than home health was able to assist her with.  She is doing home exercise program involving mostly standing with upper extremity support and activities to improve her strength.  Patient reports she is very nervous to ambulate without contact-guard assist and gait belt without being able to hold onto a wall or steady surface in order to provide her with tactile cues for the areas around her.  PAIN:  Are you having pain? None  PRECAUTIONS: Fall, hemianopsia  WEIGHT BEARING RESTRICTIONS: No  FALLS: Has patient fallen in last 6 months? No  PATIENT GOALS: Improve gait, improve strength, walk on unsteady surfaces  OBJECTIVE:   TODAY'S TREATMENT:                                                                                                                              DATE: 02/23/23   Amb 150' with hemiwalker and CGA - slower gait speed than usual Sit to stand x 8  Seated marching x 10 each LE  Amb 150' with hemiwalker and CGA  Seated LAQ x 10 each LE Seated hip abduction with manual resistance x  10  PATIENT EDUCATION: Education details:Pt educated throughout session about proper posture and technique with exercises. Improved exercise technique, movement at target joints, use of target muscles after min to mod verbal, visual, tactile cues.  Person educated: Patient Education method: Explanation Education comprehension: verbalized understanding  HOME EXERCISE PROGRAM:  Access Code: [email or web address] URL: [email or web address] Date: 11/24/2022 Prepared by: Thresa Ross  Exercises - Alternating Step Taps with Counter Support  - 7 x weekly - 2 sets -  10 reps - Sit to Stand  - 1 x daily - 7 x weekly - 2 sets - 10 reps - Standing Tandem Balance with Counter Support  - 1 x daily - 3 x weekly - 2 sets - 2 reps - 10 hold - Standing Single Leg Stance with Counter Support  - 1 x daily - 3 x weekly - 2 sets - 2 reps - 5 hold - Seated Long Arc Quad with Ankle Weight  - 1 x daily - 7 x weekly - 2 sets - 10 reps  Previous: Pt performs leg lifts and squats with UE support    GOALS: Goals reviewed with patient? Yes  SHORT TERM GOALS: Target date: 06/24/2022    Patient will be independent in home exercise program to improve strength/mobility for better functional independence with ADLs. Baseline: No HEP currently 9/12: pt completing HEP regularly.  Goal status: MET  LONG TERM GOALS: Target date: 01/29/2023    1.  Patient (> 38 years old) will complete five times sit to stand test in < 15 seconds indicating an increased LE strength and improved balance. Baseline:  Test second visit ; 05/29/2022: 40 sec pull to stance RUE 07/10/22: 22.82 sec. 08/18/22: 16.36 sec with UE support 09/29/22:19.32 sec, 42 sec without UE assist  9/9:19.86 sec no UE assist  Goal status: ONGOING/ PROGRESSING  2.  Patient will increase FOTO score by 8 or more points  to demonstrate statistically significant improvement in mobility and quality of life.  Baseline: Test second visit; 05/28/2040: 45; 07/10/22:  41, 08/18/22: 45 8/5:48;  Goal status: NOT MET   3.  Patient will increase 10 meter walk test to >.53m/s as to improve gait speed for better community ambulation and to reduce fall risk. Baseline: Test visit 2 06/19/22: 53 sec 5/16: 77 sec. . 8/5:46 sec with SPC, 33 sec with RW; 9/12: 37.66 sec, 24 sec with 3WW; 02/02/23: 0.69m/s  Goal status: NOT MET   4. Patient will improve ability to ambulate on uneven surfaces as evidenced by ability to ambulate over simulated uneven terrain in clinic to improve independence with ambulation. 9/12: pt ambulates over red mat and blue mat without LOB, still not confident on red mat; 02/02/23: pt does not wish to continue to work on this goal, no longer meaningful 12/16: Patient still feels is important to work on ambulating on uneven surfaces but mostly surfaces including inclines, declines, and other solid but uneven surfaces.  She reports needs to do this in order to ambulate out of her house as she has a ramp she is currently unable to do this safely and with confidence. Goal status: MET  5.  Pt will ascend and descend 4 stairs with unilateral UE assist and with step through pattern of gait and without any sign of imbalance to improve her community mobility.  Baseline: Pt has R lateral lean with ascending and descending and has LLE internal rotation of foot when stepping down. 02/02/23: continued LLE impairments and presence of rigid AFO discount any possibility of a step-over-step pattern for stairs navigation.  Goal status: NOT MET   6.  Pt will step on and off of curb or simulated curb using SPC in order to allow for improved community mobility.  Baseline: Unable, attempted 9/12; 02/02/23: Pt does not feel this is relevant, does not feel safe AMB overground with device on flat surfaces. 12/16: Patient feels it is more relevant for her to utilize a hemiwalker but does still feel she wants  to have the ability to step on and off a curb or at least a curb cut in a  handicap type space Goal status: NOT MET    ASSESSMENT:  CLINICAL IMPRESSION:    Patient presents to PT session with decline in motivation due to memories associated with Christmas. Session focused on BLE strengthening and endurance. Tolerated session well. Pt will continue to benefit from skilled physical therapy intervention to address impairments, improve QOL, and attain therapy goals.     OBJECTIVE IMPAIRMENTS: Abnormal gait, decreased activity tolerance, decreased balance, decreased endurance, decreased strength, decreased safety awareness, hypomobility, impaired flexibility, and impaired UE functional use.   ACTIVITY LIMITATIONS: lifting, bending, standing, stairs, transfers, bed mobility, and locomotion level  PARTICIPATION LIMITATIONS: meal prep, cleaning, shopping, and community activity  PERSONAL FACTORS: Time since onset of injury/illness/exacerbation and 3+ comorbidities: diabetes, HTN, Multiple previous strokes   are also affecting patient's functional outcome.   REHAB POTENTIAL: Fair secondary to time since onset and severely of visual deficits and L UE funcitonal use   CLINICAL DECISION MAKING: Stable/uncomplicated  EVALUATION COMPLEXITY: Low  PLAN:  PT FREQUENCY: 1-2x/week   PT DURATION: 3 weeks   PLANNED INTERVENTIONS: Therapeutic exercises, Therapeutic activity, Neuromuscular re-education, Balance training, Gait training, Patient/Family education, Self Care, Joint mobilization, Stair training, Moist heat, Manual therapy, and Re-evaluation  PLAN FOR NEXT SESSION:  Review right goals to make them specific towards hemiwalker and improving her efficiency and safety with ambulation with this and submit  a new certification  2:00 PM, 02/23/23 Maylon Peppers, PT, DPT  Physical Therapist- [place]  [facility]

## 2023-02-27 ENCOUNTER — Ambulatory Visit: Payer: PPO | Admitting: Podiatry

## 2023-02-27 ENCOUNTER — Encounter: Payer: Self-pay | Admitting: Podiatry

## 2023-02-27 DIAGNOSIS — M79674 Pain in right toe(s): Secondary | ICD-10-CM | POA: Diagnosis not present

## 2023-02-27 DIAGNOSIS — M79675 Pain in left toe(s): Secondary | ICD-10-CM | POA: Diagnosis not present

## 2023-02-27 DIAGNOSIS — B351 Tinea unguium: Secondary | ICD-10-CM | POA: Diagnosis not present

## 2023-02-27 DIAGNOSIS — M216X2 Other acquired deformities of left foot: Secondary | ICD-10-CM

## 2023-02-27 NOTE — Progress Notes (Signed)
 Chief Complaint  Patient presents with   Diabetes    I'm not happy.  I've been waiting for a hour.  I'm here for Diabetic Foot Care.     SUBJECTIVE Patient with a history of diabetes mellitus minimally ambulatory presenting today in a wheelchair presents to office today complaining of elongated, thickened nails that cause pain while ambulating in shoes.  Patient is unable to trim their own nails.  Patient has history of dropfoot deformity/hemiparesis/hemiparesis secondary to stroke.  Patient is here for further evaluation and treatment.   Past Medical History:  Diagnosis Date   Abdominal pain 12/06/2013   Abnormality of gait 01/26/2013   Altered bowel habits 12/11/2017   Bruises easily    Constipation    Crohn's disease (HCC)    Depression    Dizziness and giddiness 01/26/2013   Fecal incontinence alternating with constipation 12/11/2017   GERD (gastroesophageal reflux disease)    Hemianopia    left   Hemiparesis and alteration of sensations as late effects of stroke (HCC) 01/26/2013   Hyperlipidemia    Hypertension    Hypothyroidism    Lump in female breast    Panic disorder    PONV (postoperative nausea and vomiting)    Stroke (HCC) 2009   Tremor, essential 04/24/2020   Type II diabetes mellitus (HCC)    Past Surgical History:  Procedure Laterality Date   ANAL FISSURE REPAIR  2008   APPENDECTOMY  1986   BREAST BIOPSY Left 2006   neg   BREAST LUMPECTOMY Right 1990's   CESAREAN SECTION  1981; 1983   CHOLECYSTECTOMY  1986   SMALL INTESTINE SURGERY  05/1991   TUBAL LIGATION  1983   Allergies  Allergen Reactions   Codeine Other (See Comments)    Syncope.   Morphine  And Codeine Nausea And Vomiting and Other (See Comments)    Hallucinations.   Baclofen Nausea Only and Other (See Comments)    dizziness   Penicillins Other (See Comments)    Family history   Sulfamethoxazole Itching    Hands itching    Elemental Sulfur Itching    Per patient happened years ago.    Metformin  Hcl Other (See Comments) and Diarrhea    diarrhea     OBJECTIVE General Patient is awake, alert, and oriented x 3 and in no acute distress. Derm Skin is dry and supple bilateral. Negative open lesions or macerations. Remaining integument unremarkable. Nails are tender, long, thickened and dystrophic with subungual debris, consistent with onychomycosis, 1-5 bilateral. No signs of infection noted. Vasc  DP and PT pedal pulses palpable bilaterally. Temperature gradient within normal limits.  Neuro Epicritic and protective threshold sensation diminished bilaterally.  Musculoskeletal Exam hemiparesis is affecting the left lower extremity.  History of stroke.  Pes planovalgus deformity with arch collapse bilateral minimally ambulatory mostly in a wheelchair  ASSESSMENT 1. Diabetes Mellitus w/ peripheral neuropathy.   2.  Pain due to onychomycosis of toenails bilateral 3.  History of stroke causing left-sided hemiparesis  4.  Dropfoot deformity left lower extremity 5.  Pes planovalgus deformity bilateral  PLAN OF CARE -Patient evaluated today -Instructed to maintain good pedal hygiene and foot care. Stressed importance of controlling blood sugar.  -Mechanical debridement of nails 1-5 bilaterally performed using a nail nipper. Filed with dremel without incident.  -Continue AFO LLE.  New AFO brace dispensed at [facility] orthotics lab however the patient is not satisfied with the fit and style of the AFO.  She continues to work  closely with [facility] lab - Continue diabetic shoes and insoles from [facility].  Diabetic shoes and insoles medically necessary to support the feet and prevent focal pressure areas of the feet which could potentially progress to diabetic foot ulcers and support the medial longitudinal arch of the foot bilateral. -Advised against going barefoot -Return to clinic 3 months routine footcare    Thresa EMERSON Sar, DPM [facility]  Dr. Thresa EMERSON Sar, DPM    [address] [place], [place] [address]                Office [phone]  Fax [phone]

## 2023-03-02 ENCOUNTER — Ambulatory Visit: Payer: PPO | Attending: Neurology | Admitting: Physical Therapy

## 2023-03-02 ENCOUNTER — Other Ambulatory Visit: Payer: Self-pay

## 2023-03-02 DIAGNOSIS — M6281 Muscle weakness (generalized): Secondary | ICD-10-CM | POA: Insufficient documentation

## 2023-03-02 DIAGNOSIS — R2681 Unsteadiness on feet: Secondary | ICD-10-CM | POA: Insufficient documentation

## 2023-03-02 DIAGNOSIS — R2689 Other abnormalities of gait and mobility: Secondary | ICD-10-CM | POA: Diagnosis not present

## 2023-03-02 DIAGNOSIS — R269 Unspecified abnormalities of gait and mobility: Secondary | ICD-10-CM | POA: Insufficient documentation

## 2023-03-02 DIAGNOSIS — R262 Difficulty in walking, not elsewhere classified: Secondary | ICD-10-CM | POA: Diagnosis not present

## 2023-03-02 NOTE — Therapy (Signed)
 OUTPATIENT PHYSICAL THERAPY TREATMENT/ RECERT    Patient Name: [name] MRN: [id number] DOB:March 11, 1950, 73 y.o., female Today's Date: 03/02/2023   PCP:  Sherial Bail, MD   REFERRING PROVIDER: Lane Arthea BRAVO, MD   END OF SESSION:  PT End of Session - 03/02/23 1501     Visit Number 54    Number of Visits 64    Date for PT Re-Evaluation 04/27/23    Authorization Type [employer]    Authorization Time Period 02/02/23-02/24/23    Progress Note Due on Visit 60    PT Start Time 1445    PT Stop Time 1530    PT Time Calculation (min) 45 min    Equipment Utilized During Treatment Gait belt    Activity Tolerance Patient tolerated treatment well;No increased pain    Behavior During Therapy [facility] for tasks assessed/performed                Past Medical History:  Diagnosis Date   Abdominal pain 12/06/2013   Abnormality of gait 01/26/2013   Altered bowel habits 12/11/2017   Bruises easily    Constipation    Crohn's disease (HCC)    Depression    Dizziness and giddiness 01/26/2013   Fecal incontinence alternating with constipation 12/11/2017   GERD (gastroesophageal reflux disease)    Hemianopia    left   Hemiparesis and alteration of sensations as late effects of stroke (HCC) 01/26/2013   Hyperlipidemia    Hypertension    Hypothyroidism    Lump in female breast    Panic disorder    PONV (postoperative nausea and vomiting)    Stroke (HCC) 2009   Tremor, essential 04/24/2020   Type II diabetes mellitus (HCC)    Past Surgical History:  Procedure Laterality Date   ANAL FISSURE REPAIR  2008   APPENDECTOMY  1986   BREAST BIOPSY Left 2006   neg   BREAST LUMPECTOMY Right 1990's   CESAREAN SECTION  1981; 1983   CHOLECYSTECTOMY  1986   SMALL INTESTINE SURGERY  05/1991   TUBAL LIGATION  1983   Patient Active Problem List   Diagnosis Date Noted   Bone spur 06/13/2021   Acute CVA (cerebrovascular accident) (HCC) 03/07/2021   CVA (cerebral  vascular accident) (HCC) 11/01/2020   Chronic pain 04/25/2020   Chronic constipation 04/25/2020   Gastro-esophageal reflux disease without esophagitis 04/25/2020   Hardening of the aorta (main artery of the heart) (HCC) 04/25/2020   History of iron  deficiency anemia 04/25/2020   Hypercholesteremia 04/25/2020   Hypertension 04/25/2020   Personal history of transient ischemic attack (TIA), and cerebral infarction without residual deficits 04/25/2020   Pulmonary emphysema (HCC) 04/25/2020   Vitamin D deficiency 04/25/2020   Tremor, essential 04/24/2020   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   Right lower quadrant abdominal pain 08/18/2018   Spastic hemiplegia affecting nondominant side (HCC) 02/03/2014   Diabetes mellitus (HCC) 09/07/2013   Hypothyroidism 09/07/2013   Type 2 diabetes mellitus without complications (HCC) 09/07/2013   Hemiparesis and alteration of sensations as late effects of stroke (HCC) 01/26/2013   Unsteady gait 01/26/2013   Dizziness and giddiness 01/26/2013    ONSET DATE: 2009  REFERRING DIAG: Z86.73 (ICD-10-CM) - History of stroke   THERAPY DIAG:  Difficulty in walking, not elsewhere classified  Other abnormalities of gait and mobility  Unsteadiness on feet  Abnormality of gait and mobility  Rationale for Evaluation and Treatment: Rehabilitation  SUBJECTIVE:  SUBJECTIVE STATEMENT:   Pt reports doing well today. Pt denies any recent falls/stumbles since prior session. Pt denies any updates to medications or medical appointment since prior session. Feeling unmotivated due to emotional attachment to Adonna (father and mother died at Christmas time).    Pt accompanied by: self  PERTINENT HISTORY: Pt has had one major stroke and 5 mini strokes since 2009. Pt is  here since her doctor wants her to work on her strength and balance and she also wants to improve in these categories. Pt has hemianopsia with blindness inferior, left side and superior. Pt initially had stroke in 2009 and was previously able to walk for long distances. Pt has 650 feet driveway that is gravel and used to walk up and down with the cane. Pt has issues with depth perception. Pt is unable to tell angles of concrete and holes in the ground. Pt has service dogs that are retired, one for guidance and one for helping with picking up and retrieving items. Pt had most recent stroke in January 2023 that further limited her field of vision.  Patient wants to work diligently on her strength, balance, mobility, and in order to improve her ability to ambulate on various surfaces in various scenarios.  Patient wanted to come to outpatient services to utilize equipment to improve her strength and a greater capacity than home health was able to assist her with.  She is doing home exercise program involving mostly standing with upper extremity support and activities to improve her strength.  Patient reports she is very nervous to ambulate without contact-guard assist and gait belt without being able to hold onto a wall or steady surface in order to provide her with tactile cues for the areas around her.  PAIN:  Are you having pain? None  PRECAUTIONS: Fall, hemianopsia  WEIGHT BEARING RESTRICTIONS: No  FALLS: Has patient fallen in last 6 months? No  PATIENT GOALS: Improve gait, improve strength, walk on unsteady surfaces  OBJECTIVE:   TODAY'S TREATMENT:                                                                                                                              DATE: 03/02/23   Amb 150' with hemiwalker and CGA - slower gait speed than usual Sit to stand x 8  Seated marching x 10 each LE  Amb 150' with hemiwalker and CGA  Seated LAQ x 10 each LE Seated hip abduction with manual  resistance x 10  PATIENT EDUCATION: Education details:Pt educated throughout session about proper posture and technique with exercises. Improved exercise technique, movement at target joints, use of target muscles after min to mod verbal, visual, tactile cues.  Person educated: Patient Education method: Explanation Education comprehension: verbalized understanding  HOME EXERCISE PROGRAM:  Access Code: [email or web address] URL: [email or web address] Date: 11/24/2022 Prepared by: Lonni Gainer  Exercises - Alternating Step Taps with Counter Support  - 7 x weekly - 2 sets -  10 reps - Sit to Stand  - 1 x daily - 7 x weekly - 2 sets - 10 reps - Standing Tandem Balance with Counter Support  - 1 x daily - 3 x weekly - 2 sets - 2 reps - 10 hold - Standing Single Leg Stance with Counter Support  - 1 x daily - 3 x weekly - 2 sets - 2 reps - 5 hold - Seated Long Arc Quad with Ankle Weight  - 1 x daily - 7 x weekly - 2 sets - 10 reps  Previous: Pt performs leg lifts and squats with UE support    GOALS: Goals reviewed with patient? Yes  SHORT TERM GOALS: Target date: 06/24/2022    Patient will be independent in home exercise program to improve strength/mobility for better functional independence with ADLs. Baseline: No HEP currently 9/12: pt completing HEP regularly.  Goal status: MET  LONG TERM GOALS: Target date: 04/27/2023      1.  Patient (> 46 years old) will complete five times sit to stand test in < 15 seconds indicating an increased LE strength and improved balance. Baseline:  Test second visit ; 05/29/2022: 40 sec pull to stance RUE 07/10/22: 22.82 sec. 08/18/22: 16.36 sec with UE support 09/29/22:19.32 sec, 42 sec without UE assist  9/9:19.86 sec no UE assist  Goal status: ONGOING/ PROGRESSING  2.  Patient will increase FOTO score by 8 or more points  to demonstrate statistically significant improvement in mobility and quality of life.  Baseline: Test second visit; 05/28/2040:  45; 07/10/22: 41, 08/18/22: 45 8/5:48;  1/6:46 Goal status: NOT MET   3.  Patient will increase 10 meter walk test to >.32m/s with hemiwalker as to improve gait speed for better community ambulation and to reduce fall risk. Baseline:  03/01/22: 0.41m/s  Goal status: REVISED  4. Patient will improve ability to ambulate on uneven surfaces as evidenced by ability to ambulate over simulated uneven terrain in clinic to improve independence with ambulation. 9/12: pt ambulates over red mat and blue mat without LOB, still not confident on red mat; 02/02/23: pt does not wish to continue to work on this goal, no longer meaningful 12/16: Patient still feels is important to work on ambulating on uneven surfaces but mostly surfaces including inclines, declines, and other solid but uneven surfaces.  She reports needs to do this in order to ambulate out of her house as she has a ramp she is currently unable to do this safely and with confidence. Goal status: ONGOING  5.  Pt will ascend and descend 4 stairs with unilateral UE assist and with step through pattern of gait and without any sign of imbalance to improve her community mobility.  Baseline: Pt has R lateral lean with ascending and descending and has LLE internal rotation of foot when stepping down. 02/02/23: continued LLE impairments and presence of rigid AFO discount any possibility of a step-over-step pattern for stairs navigation.  Goal status: DISCONTINUED  6.  Pt will step on and off of curb or simulated curb using SPC in order to allow for improved community mobility.  Baseline: Unable, attempted 9/12; 02/02/23: Pt does not feel this is relevant, does not feel safe AMB overground with device on flat surfaces. 12/16: Patient feels it is more relevant for her to utilize a hemiwalker but does still feel she wants to have the ability to step on and off a curb or at least a curb cut in a handicap type space 03/02/23:  Pt able to navigate with close CGA and increased  time to completion at last visit  Goal status: IN PROGRESS    ASSESSMENT:  CLINICAL IMPRESSION:     Patient presents to physical therapy for recert note this date.  Patient presents with complaints of of left rigid thermoplastic AFO continuing to be a difficulty for her with the straps and also the discomfort at her lateral ankle despite padding.  Following this goals were assessed.  During last treatment session with this therapist patient did show progress with ability to ambulate in parking lot walking up and down 1 curb successfully although he did take increased time as well as close contact-guard assist for safety.  Will continue to progress with this in future sessions.  Patient's gait speed showing improvement with hemiwalker but is still not at safe community ambulator level even in short distances at this time.  Patient will continue to benefit from skilled physical therapy to improve her lower extremity strength, mobility, balance, and confidence with more efficient gait patterns.Patient's condition has the potential to improve in response to therapy. Maximum improvement is yet to be obtained. The anticipated improvement is attainable and reasonable in a generally predictable time.      OBJECTIVE IMPAIRMENTS: Abnormal gait, decreased activity tolerance, decreased balance, decreased endurance, decreased strength, decreased safety awareness, hypomobility, impaired flexibility, and impaired UE functional use.   ACTIVITY LIMITATIONS: lifting, bending, standing, stairs, transfers, bed mobility, and locomotion level  PARTICIPATION LIMITATIONS: meal prep, cleaning, shopping, and community activity  PERSONAL FACTORS: Time since onset of injury/illness/exacerbation and 3+ comorbidities: diabetes, HTN, Multiple previous strokes   are also affecting patient's functional outcome.   REHAB POTENTIAL: Fair secondary to time since onset and severely of visual deficits and L UE funcitonal use    CLINICAL DECISION MAKING: Stable/uncomplicated  EVALUATION COMPLEXITY: Low  PLAN:  PT FREQUENCY: 1-2x/week   PT DURATION: 8 weeks   PLANNED INTERVENTIONS: Therapeutic exercises, Therapeutic activity, Neuromuscular re-education, Balance training, Gait training, Patient/Family education, Self Care, Joint mobilization, Stair training, Moist heat, Manual therapy, and Re-evaluation  PLAN FOR NEXT SESSION:  Review right goals to make them specific towards hemiwalker and improving her efficiency and safety with ambulation with this and submit  a new certification  4:00 PM, 03/02/23 Lonni KATHEE Gainer PT ,DPT Physical Therapist- [facility]  [facility]

## 2023-03-03 DIAGNOSIS — I152 Hypertension secondary to endocrine disorders: Secondary | ICD-10-CM | POA: Diagnosis not present

## 2023-03-03 DIAGNOSIS — E039 Hypothyroidism, unspecified: Secondary | ICD-10-CM | POA: Diagnosis not present

## 2023-03-03 DIAGNOSIS — E1142 Type 2 diabetes mellitus with diabetic polyneuropathy: Secondary | ICD-10-CM | POA: Diagnosis not present

## 2023-03-03 DIAGNOSIS — Z794 Long term (current) use of insulin: Secondary | ICD-10-CM | POA: Diagnosis not present

## 2023-03-03 DIAGNOSIS — E785 Hyperlipidemia, unspecified: Secondary | ICD-10-CM | POA: Diagnosis not present

## 2023-03-03 DIAGNOSIS — E1169 Type 2 diabetes mellitus with other specified complication: Secondary | ICD-10-CM | POA: Diagnosis not present

## 2023-03-03 DIAGNOSIS — E1159 Type 2 diabetes mellitus with other circulatory complications: Secondary | ICD-10-CM | POA: Diagnosis not present

## 2023-03-03 DIAGNOSIS — E559 Vitamin D deficiency, unspecified: Secondary | ICD-10-CM | POA: Diagnosis not present

## 2023-03-04 ENCOUNTER — Other Ambulatory Visit: Payer: Self-pay

## 2023-03-04 ENCOUNTER — Ambulatory Visit: Payer: PPO | Admitting: Physical Therapy

## 2023-03-06 ENCOUNTER — Other Ambulatory Visit: Payer: Self-pay

## 2023-03-09 ENCOUNTER — Ambulatory Visit: Payer: PPO | Admitting: Physical Therapy

## 2023-03-09 ENCOUNTER — Other Ambulatory Visit: Payer: Self-pay

## 2023-03-09 DIAGNOSIS — R2689 Other abnormalities of gait and mobility: Secondary | ICD-10-CM

## 2023-03-09 DIAGNOSIS — R262 Difficulty in walking, not elsewhere classified: Secondary | ICD-10-CM | POA: Diagnosis not present

## 2023-03-09 DIAGNOSIS — M6281 Muscle weakness (generalized): Secondary | ICD-10-CM

## 2023-03-09 DIAGNOSIS — R2681 Unsteadiness on feet: Secondary | ICD-10-CM

## 2023-03-09 DIAGNOSIS — R269 Unspecified abnormalities of gait and mobility: Secondary | ICD-10-CM

## 2023-03-09 MED ORDER — CLOPIDOGREL BISULFATE 75 MG PO TABS
75.0000 mg | ORAL_TABLET | Freq: Every day | ORAL | 1 refills | Status: DC
  Filled 2023-03-09: qty 90, 90d supply, fill #0
  Filled 2023-05-26: qty 90, 90d supply, fill #1

## 2023-03-09 NOTE — Therapy (Signed)
 OUTPATIENT PHYSICAL THERAPY TREATMENT   Patient Name: [name] MRN: [id number] DOB:[birth year], 73 y.o., female Today's Date: 03/09/2023   PCP:  Sherial Bail, MD   REFERRING PROVIDER: Lane Arthea BRAVO, MD   END OF SESSION:  PT End of Session - 03/09/23 1448     Visit Number 55    Number of Visits 64    Date for PT Re-Evaluation 04/27/23    Authorization Type [employer]    Authorization Time Period 02/02/23-02/24/23    Progress Note Due on Visit 60    PT Start Time 1445    PT Stop Time 1527    PT Time Calculation (min) 42 min    Equipment Utilized During Treatment Gait belt    Activity Tolerance Patient tolerated treatment well;No increased pain    Behavior During Therapy [facility] for tasks assessed/performed                Past Medical History:  Diagnosis Date   Abdominal pain 12/06/2013   Abnormality of gait 01/26/2013   Altered bowel habits 12/11/2017   Bruises easily    Constipation    Crohn's disease (HCC)    Depression    Dizziness and giddiness 01/26/2013   Fecal incontinence alternating with constipation 12/11/2017   GERD (gastroesophageal reflux disease)    Hemianopia    left   Hemiparesis and alteration of sensations as late effects of stroke (HCC) 01/26/2013   Hyperlipidemia    Hypertension    Hypothyroidism    Lump in female breast    Panic disorder    PONV (postoperative nausea and vomiting)    Stroke (HCC) 2009   Tremor, essential 04/24/2020   Type II diabetes mellitus (HCC)    Past Surgical History:  Procedure Laterality Date   ANAL FISSURE REPAIR  2008   APPENDECTOMY  1986   BREAST BIOPSY Left 2006   neg   BREAST LUMPECTOMY Right 1990's   CESAREAN SECTION  1981; 1983   CHOLECYSTECTOMY  1986   SMALL INTESTINE SURGERY  05/1991   TUBAL LIGATION  1983   Patient Active Problem List   Diagnosis Date Noted   Bone spur 06/13/2021   Acute CVA (cerebrovascular accident) (HCC) 03/07/2021   CVA (cerebral vascular  accident) (HCC) 11/01/2020   Chronic pain 04/25/2020   Chronic constipation 04/25/2020   Gastro-esophageal reflux disease without esophagitis 04/25/2020   Hardening of the aorta (main artery of the heart) (HCC) 04/25/2020   History of iron  deficiency anemia 04/25/2020   Hypercholesteremia 04/25/2020   Hypertension 04/25/2020   Personal history of transient ischemic attack (TIA), and cerebral infarction without residual deficits 04/25/2020   Pulmonary emphysema (HCC) 04/25/2020   Vitamin D deficiency 04/25/2020   Tremor, essential 04/24/2020   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   Right lower quadrant abdominal pain 08/18/2018   Spastic hemiplegia affecting nondominant side (HCC) 02/03/2014   Diabetes mellitus (HCC) 09/07/2013   Hypothyroidism 09/07/2013   Type 2 diabetes mellitus without complications (HCC) 09/07/2013   Hemiparesis and alteration of sensations as late effects of stroke (HCC) 01/26/2013   Unsteady gait 01/26/2013   Dizziness and giddiness 01/26/2013    ONSET DATE: 2009  REFERRING DIAG: Z86.73 (ICD-10-CM) - History of stroke   THERAPY DIAG:  Difficulty in walking, not elsewhere classified  Other abnormalities of gait and mobility  Unsteadiness on feet  Abnormality of gait and mobility  Muscle weakness (generalized)  Rationale for Evaluation and Treatment: Rehabilitation  SUBJECTIVE:  SUBJECTIVE STATEMENT:   Pt reports doing well today. Pt denies any recent falls/stumbles since prior session. Pt denies any updates to medications or medical appointment since prior session. Feeling unmotivated due to emotional attachment to [place]onna (father and mother died at Christmas time).    Pt accompanied by: self  PERTINENT HISTORY: Pt has had one major stroke and 5 mini  strokes since 2009. Pt is here since her doctor wants her to work on her strength and balance and she also wants to improve in these categories. Pt has hemianopsia with blindness inferior, left side and superior. Pt initially had stroke in 2009 and was previously able to walk for long distances. Pt has 650 feet driveway that is gravel and used to walk up and down with the cane. Pt has issues with depth perception. Pt is unable to tell angles of concrete and holes in the ground. Pt has service dogs that are retired, one for guidance and one for helping with picking up and retrieving items. Pt had most recent stroke in January 2023 that further limited her field of vision.  Patient wants to work diligently on her strength, balance, mobility, and in order to improve her ability to ambulate on various surfaces in various scenarios.  Patient wanted to come to outpatient services to utilize equipment to improve her strength and a greater capacity than home health was able to assist her with.  She is doing home exercise program involving mostly standing with upper extremity support and activities to improve her strength.  Patient reports she is very nervous to ambulate without contact-guard assist and gait belt without being able to hold onto a wall or steady surface in order to provide her with tactile cues for the areas around her.  PAIN:  Are you having pain? None  PRECAUTIONS: Fall, hemianopsia  WEIGHT BEARING RESTRICTIONS: No  FALLS: Has patient fallen in last 6 months? No  PATIENT GOALS: Improve gait, improve strength, walk on unsteady surfaces  OBJECTIVE:   TODAY'S TREATMENT:                                                                                                                              DATE: 03/09/23   TA Amb 85 ft with weaving between obstacles with hemi-walker  Sit to stand x 10, focus on anterior weight shift  Amb 85 ft with hemiwalker and CGA around various obstacles  Sit to  stand x 10 with improved Ant weight shift copmared to first set  Seated knee flex/ext using bolster for rolling 2 x 8  Standing with hemiwalker practicing step through gait pattern for part to whole practice ( x 10 steps through with QC in appropriate spot x 10 reps ea side)  PATIENT EDUCATION: Education details:Pt educated throughout session about proper posture and technique with exercises. Improved exercise technique, movement at target joints, use of target muscles after min to mod verbal, visual, tactile cues.  Person educated: Patient  Education method: Explanation Education comprehension: verbalized understanding  HOME EXERCISE PROGRAM:  Access Code: [email or web address] URL: [email or web address] Date: 11/24/2022 Prepared by: Lonni Gainer  Exercises - Alternating Step Taps with Counter Support  - 7 x weekly - 2 sets - 10 reps - Sit to Stand  - 1 x daily - 7 x weekly - 2 sets - 10 reps - Standing Tandem Balance with Counter Support  - 1 x daily - 3 x weekly - 2 sets - 2 reps - 10 hold - Standing Single Leg Stance with Counter Support  - 1 x daily - 3 x weekly - 2 sets - 2 reps - 5 hold - Seated Long Arc Quad with Ankle Weight  - 1 x daily - 7 x weekly - 2 sets - 10 reps  Previous: Pt performs leg lifts and squats with UE support    GOALS: Goals reviewed with patient? Yes  SHORT TERM GOALS: Target date: 06/24/2022    Patient will be independent in home exercise program to improve strength/mobility for better functional independence with ADLs. Baseline: No HEP currently 9/12: pt completing HEP regularly.  Goal status: MET  LONG TERM GOALS: Target date: 04/27/2023      1.  Patient (> 40 years old) will complete five times sit to stand test in < 15 seconds indicating an increased LE strength and improved balance. Baseline:  Test second visit ; 05/29/2022: 40 sec pull to stance RUE 07/10/22: 22.82 sec. 08/18/22: 16.36 sec with UE support 09/29/22:19.32 sec, 42 sec without  UE assist  9/9:19.86 sec no UE assist  Goal status: ONGOING/ PROGRESSING  2.  Patient will increase FOTO score by 8 or more points  to demonstrate statistically significant improvement in mobility and quality of life.  Baseline: Test second visit; 05/28/2040: 45; 07/10/22: 41, 08/18/22: 45 8/5:48;  1/6:46 Goal status: NOT MET   3.  Patient will increase 10 meter walk test to >.75m/s with hemiwalker as to improve gait speed for better community ambulation and to reduce fall risk. Baseline:  03/01/22: 0.28m/s  Goal status: REVISED  4. Patient will improve ability to ambulate on uneven surfaces as evidenced by ability to ambulate over simulated uneven terrain in clinic to improve independence with ambulation. 9/12: pt ambulates over red mat and blue mat without LOB, still not confident on red mat; 02/02/23: pt does not wish to continue to work on this goal, no longer meaningful 12/16: Patient still feels is important to work on ambulating on uneven surfaces but mostly surfaces including inclines, declines, and other solid but uneven surfaces.  She reports needs to do this in order to ambulate out of her house as she has a ramp she is currently unable to do this safely and with confidence. Goal status: ONGOING  5.  Pt will ascend and descend 4 stairs with unilateral UE assist and with step through pattern of gait and without any sign of imbalance to improve her community mobility.  Baseline: Pt has R lateral lean with ascending and descending and has LLE internal rotation of foot when stepping down. 02/02/23: continued LLE impairments and presence of rigid AFO discount any possibility of a step-over-step pattern for stairs navigation.  Goal status: DISCONTINUED  6.  Pt will step on and off of curb or simulated curb using SPC in order to allow for improved community mobility.  Baseline: Unable, attempted 9/12; 02/02/23: Pt does not feel this is relevant, does not feel safe AMB overground with device on flat  surfaces.  12/16: Patient feels it is more relevant for her to utilize a hemiwalker but does still feel she wants to have the ability to step on and off a curb or at least a curb cut in a handicap type space 03/02/23: Pt able to navigate with close CGA and increased time to completion at last visit  Goal status: IN PROGRESS    ASSESSMENT:  CLINICAL IMPRESSION:     Patient arrived with good motivation form completion of pt activities.  Pt works toward more functional ambulation with object navigation with her hemiwalker. Pt also shows progress with practice of STS without use of hands. Pt still working on step through pattern of gait and will continue to benefit from part to whole practice of this. Pt will continue to benefit from skilled physical therapy intervention to address impairments, improve QOL, and attain therapy goals.     OBJECTIVE IMPAIRMENTS: Abnormal gait, decreased activity tolerance, decreased balance, decreased endurance, decreased strength, decreased safety awareness, hypomobility, impaired flexibility, and impaired UE functional use.   ACTIVITY LIMITATIONS: lifting, bending, standing, stairs, transfers, bed mobility, and locomotion level  PARTICIPATION LIMITATIONS: meal prep, cleaning, shopping, and community activity  PERSONAL FACTORS: Time since onset of injury/illness/exacerbation and 3+ comorbidities: diabetes, HTN, Multiple previous strokes   are also affecting patient's functional outcome.   REHAB POTENTIAL: Fair secondary to time since onset and severely of visual deficits and L UE funcitonal use   CLINICAL DECISION MAKING: Stable/uncomplicated  EVALUATION COMPLEXITY: Low  PLAN:  PT FREQUENCY: 1-2x/week   PT DURATION: 8 weeks   PLANNED INTERVENTIONS: Therapeutic exercises, Therapeutic activity, Neuromuscular re-education, Balance training, Gait training, Patient/Family education, Self Care, Joint mobilization, Stair training, Moist heat, Manual therapy, and  Re-evaluation  PLAN FOR NEXT SESSION:  Review right goals to make them specific towards hemiwalker and improving her efficiency and safety with ambulation with this and submit  a new certification  2:49 PM, 03/09/23 Lonni KATHEE Gainer PT ,DPT Physical Therapist- [place]  [facility]

## 2023-03-11 ENCOUNTER — Ambulatory Visit: Payer: PPO | Admitting: Physical Therapy

## 2023-03-11 DIAGNOSIS — R262 Difficulty in walking, not elsewhere classified: Secondary | ICD-10-CM

## 2023-03-11 DIAGNOSIS — R269 Unspecified abnormalities of gait and mobility: Secondary | ICD-10-CM

## 2023-03-11 DIAGNOSIS — R2681 Unsteadiness on feet: Secondary | ICD-10-CM

## 2023-03-11 DIAGNOSIS — R2689 Other abnormalities of gait and mobility: Secondary | ICD-10-CM

## 2023-03-11 NOTE — Therapy (Signed)
OUTPATIENT PHYSICAL THERAPY TREATMENT    Patient Name: [name] MRN: [id number] DOB:Feb 22, 1951, 73 y.o., female Today's Date: 03/11/2023   PCP:  Enid Baas, MD   REFERRING PROVIDER: Morene Crocker, MD   END OF SESSION:  PT End of Session - 03/11/23 1506     Visit Number 56    Number of Visits 64    Date for PT Re-Evaluation 04/27/23    Authorization Type [employer]    Authorization Time Period 02/02/23-02/24/23    Progress Note Due on Visit 60    PT Start Time 1445    PT Stop Time 1527    PT Time Calculation (min) 42 min    Equipment Utilized During Treatment Gait belt    Activity Tolerance Patient tolerated treatment well;No increased pain    Behavior During Therapy [facility] for tasks assessed/performed                Past Medical History:  Diagnosis Date   Abdominal pain 12/06/2013   Abnormality of gait 01/26/2013   Altered bowel habits 12/11/2017   Bruises easily    Constipation    Crohn's disease (HCC)    Depression    Dizziness and giddiness 01/26/2013   Fecal incontinence alternating with constipation 12/11/2017   GERD (gastroesophageal reflux disease)    Hemianopia    left   Hemiparesis and alteration of sensations as late effects of stroke (HCC) 01/26/2013   Hyperlipidemia    Hypertension    Hypothyroidism    Lump in female breast    Panic disorder    PONV (postoperative nausea and vomiting)    Stroke (HCC) 2009   Tremor, essential 04/24/2020   Type II diabetes mellitus (HCC)    Past Surgical History:  Procedure Laterality Date   ANAL FISSURE REPAIR  2008   APPENDECTOMY  1986   BREAST BIOPSY Left 2006   neg   BREAST LUMPECTOMY Right 1990's   CESAREAN SECTION  1981; 1983   CHOLECYSTECTOMY  1986   SMALL INTESTINE SURGERY  05/1991   TUBAL LIGATION  1983   Patient Active Problem List   Diagnosis Date Noted   Bone spur 06/13/2021   Acute CVA (cerebrovascular accident) (HCC) 03/07/2021   CVA (cerebral vascular  accident) (HCC) 11/01/2020   Chronic pain 04/25/2020   Chronic constipation 04/25/2020   Gastro-esophageal reflux disease without esophagitis 04/25/2020   Hardening of the aorta (main artery of the heart) (HCC) 04/25/2020   History of iron deficiency anemia 04/25/2020   Hypercholesteremia 04/25/2020   Hypertension 04/25/2020   Personal history of transient ischemic attack (TIA), and cerebral infarction without residual deficits 04/25/2020   Pulmonary emphysema (HCC) 04/25/2020   Vitamin D deficiency 04/25/2020   Tremor, essential 04/24/2020   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   Right lower quadrant abdominal pain 08/18/2018   Spastic hemiplegia affecting nondominant side (HCC) 02/03/2014   Diabetes mellitus (HCC) 09/07/2013   Hypothyroidism 09/07/2013   Type 2 diabetes mellitus without complications (HCC) 09/07/2013   Hemiparesis and alteration of sensations as late effects of stroke (HCC) 01/26/2013   Unsteady gait 01/26/2013   Dizziness and giddiness 01/26/2013    ONSET DATE: 2009  REFERRING DIAG: Z86.73 (ICD-10-CM) - History of stroke   THERAPY DIAG:  Difficulty in walking, not elsewhere classified  Other abnormalities of gait and mobility  Unsteadiness on feet  Abnormality of gait and mobility  Rationale for Evaluation and Treatment: Rehabilitation  SUBJECTIVE:  SUBJECTIVE STATEMENT:    Pt reports doing well today. Pt denies any recent falls/stumbles since prior session. Pt denies any updates to medications or medical appointment since prior session. Reprots continued issues with her current AFO.   Pt accompanied by: self  PERTINENT HISTORY: Pt has had one major stroke and 5 mini strokes since 2009. Pt is here since her doctor wants her to work on her strength and balance  and she also wants to improve in these categories. Pt has hemianopsia with blindness inferior, left side and superior. Pt initially had stroke in 2009 and was previously able to walk for long distances. Pt has 650 feet driveway that is gravel and used to walk up and down with the cane. Pt has issues with depth perception. Pt is unable to tell angles of concrete and holes in the ground. Pt has service dogs that are retired, one for guidance and one for helping with picking up and retrieving items. Pt had most recent stroke in January 2023 that further limited her field of vision.  Patient wants to work diligently on her strength, balance, mobility, and in order to improve her ability to ambulate on various surfaces in various scenarios.  Patient wanted to come to outpatient services to utilize equipment to improve her strength and a greater capacity than home health was able to assist her with.  She is doing home exercise program involving mostly standing with upper extremity support and activities to improve her strength.  Patient reports she is very nervous to ambulate without contact-guard assist and gait belt without being able to hold onto a wall or steady surface in order to provide her with tactile cues for the areas around her.  PAIN:  Are you having pain? None  PRECAUTIONS: Fall, hemianopsia  WEIGHT BEARING RESTRICTIONS: No  FALLS: Has patient fallen in last 6 months? No  PATIENT GOALS: Improve gait, improve strength, walk on unsteady surfaces  OBJECTIVE:   TODAY'S TREATMENT:                                                                                                                              DATE: 03/11/23   TA Ambulation x 130 ft with hemiwalker weaving between cones ( cones far apart) working object recognition and navigation  STS 2 x 10 reps without UE assist  Seated HS/ quad roll out with bolster x 10 ea  Standing step through gait part to whole practice ( foll step on one  side then return to start position) x 10 ea side  Ambulation with focus on step through gait practice x 70 ft   PATIENT EDUCATION: Education details:Pt educated throughout session about proper posture and technique with exercises. Improved exercise technique, movement at target joints, use of target muscles after min to mod verbal, visual, tactile cues.  Person educated: Patient Education method: Explanation Education comprehension: verbalized understanding  HOME EXERCISE PROGRAM:  Access Code: [email or web address] URL: [email or web address] Date: 11/24/2022  Prepared by: Thresa Ross  Exercises - Alternating Step Taps with Counter Support  - 7 x weekly - 2 sets - 10 reps - Sit to Stand  - 1 x daily - 7 x weekly - 2 sets - 10 reps - Standing Tandem Balance with Counter Support  - 1 x daily - 3 x weekly - 2 sets - 2 reps - 10 hold - Standing Single Leg Stance with Counter Support  - 1 x daily - 3 x weekly - 2 sets - 2 reps - 5 hold - Seated Long Arc Quad with Ankle Weight  - 1 x daily - 7 x weekly - 2 sets - 10 reps  Previous: Pt performs leg lifts and squats with UE support    GOALS: Goals reviewed with patient? Yes  SHORT TERM GOALS: Target date: 06/24/2022    Patient will be independent in home exercise program to improve strength/mobility for better functional independence with ADLs. Baseline: No HEP currently 9/12: pt completing HEP regularly.  Goal status: MET  LONG TERM GOALS: Target date: 04/27/2023      1.  Patient (> 17 years old) will complete five times sit to stand test in < 15 seconds indicating an increased LE strength and improved balance. Baseline:  Test second visit ; 05/29/2022: 40 sec pull to stance RUE 07/10/22: 22.82 sec. 08/18/22: 16.36 sec with UE support 09/29/22:19.32 sec, 42 sec without UE assist  9/9:19.86 sec no UE assist  Goal status: ONGOING/ PROGRESSING  2.  Patient will increase FOTO score by 8 or more points  to demonstrate statistically  significant improvement in mobility and quality of life.  Baseline: Test second visit; 05/28/2040: 45; 07/10/22: 41, 08/18/22: 45 8/5:48;  1/6:46 Goal status: NOT MET   3.  Patient will increase 10 meter walk test to >.36m/s with hemiwalker as to improve gait speed for better community ambulation and to reduce fall risk. Baseline:  03/01/22: 0.74m/s  Goal status: REVISED  4. Patient will improve ability to ambulate on uneven surfaces as evidenced by ability to ambulate over simulated uneven terrain in clinic to improve independence with ambulation. 9/12: pt ambulates over red mat and blue mat without LOB, still not confident on red mat; 02/02/23: pt does not wish to continue to work on this goal, no longer meaningful 12/16: Patient still feels is important to work on ambulating on uneven surfaces but mostly surfaces including inclines, declines, and other solid but uneven surfaces.  She reports needs to do this in order to ambulate out of her house as she has a ramp she is currently unable to do this safely and with confidence. Goal status: ONGOING  5.  Pt will ascend and descend 4 stairs with unilateral UE assist and with step through pattern of gait and without any sign of imbalance to improve her community mobility.  Baseline: Pt has R lateral lean with ascending and descending and has LLE internal rotation of foot when stepping down. 02/02/23: continued LLE impairments and presence of rigid AFO discount any possibility of a step-over-step pattern for stairs navigation.  Goal status: DISCONTINUED  6.  Pt will step on and off of curb or simulated curb using SPC in order to allow for improved community mobility.  Baseline: Unable, attempted 9/12; 02/02/23: Pt does not feel this is relevant, does not feel safe AMB overground with device on flat surfaces. 12/16: Patient feels it is more relevant for her to utilize a hemiwalker but does still feel she wants to  have the ability to step on and off a curb or at  least a curb cut in a handicap type space 03/02/23: Pt able to navigate with close CGA and increased time to completion at last visit  Goal status: IN PROGRESS    ASSESSMENT:  CLINICAL IMPRESSION:     Patient arrived with good motivation form completion of pt activities.  Pt works toward more functional ambulation with object navigation with her hemiwalker. Pt also shows progress with practice of STS without use of hands. Pt still working on step through pattern of gait and will continue to benefit from part to whole practice of this. Pt will continue to benefit from skilled physical therapy intervention to address impairments, improve QOL, and attain therapy goals.   Is important to note patient still has continued difficulty with her AFO.  AFO is causing some significant pressure and discomfort on the lateral aspect of her left lower extremity and with her history this could lead to pressure injury.  Patient also has difficulty donning the brace secondary to her left upper extremity spasticity and hemiparesis.  This makes this brace less appropriate for independent donning for ambulation within the home requiring a reliance on caregivers to always present in the home for donning and doffing.  A more appropriate brace would likely be a double upright AFO with a T-strap.  Patient was informed of pros and cons of this kind of brace but it would minimize the pressure and risk of pressure injuries or sores on her lower extremities.  OBJECTIVE IMPAIRMENTS: Abnormal gait, decreased activity tolerance, decreased balance, decreased endurance, decreased strength, decreased safety awareness, hypomobility, impaired flexibility, and impaired UE functional use.   ACTIVITY LIMITATIONS: lifting, bending, standing, stairs, transfers, bed mobility, and locomotion level  PARTICIPATION LIMITATIONS: meal prep, cleaning, shopping, and community activity  PERSONAL FACTORS: Time since onset of  injury/illness/exacerbation and 3+ comorbidities: diabetes, HTN, Multiple previous strokes   are also affecting patient's functional outcome.   REHAB POTENTIAL: Fair secondary to time since onset and severely of visual deficits and L UE funcitonal use   CLINICAL DECISION MAKING: Stable/uncomplicated  EVALUATION COMPLEXITY: Low  PLAN:  PT FREQUENCY: 1-2x/week   PT DURATION: 8 weeks   PLANNED INTERVENTIONS: Therapeutic exercises, Therapeutic activity, Neuromuscular re-education, Balance training, Gait training, Patient/Family education, Self Care, Joint mobilization, Stair training, Moist heat, Manual therapy, and Re-evaluation  PLAN FOR NEXT SESSION:  Review right goals to make them specific towards hemiwalker and improving her efficiency and safety with ambulation with this and submit  a new certification  3:11 PM, 03/11/23 Norman Herrlich PT ,DPT Physical Therapist- [place]  [facility]

## 2023-03-15 ENCOUNTER — Other Ambulatory Visit: Payer: Self-pay

## 2023-03-16 ENCOUNTER — Other Ambulatory Visit: Payer: Self-pay

## 2023-03-16 ENCOUNTER — Ambulatory Visit: Payer: PPO | Admitting: Physical Therapy

## 2023-03-18 ENCOUNTER — Ambulatory Visit: Payer: PPO | Admitting: Physical Therapy

## 2023-03-19 ENCOUNTER — Encounter: Payer: Self-pay | Admitting: Dermatology

## 2023-03-20 ENCOUNTER — Other Ambulatory Visit: Payer: Self-pay

## 2023-03-20 MED ORDER — DEXCOM G7 SENSOR MISC
3 refills | Status: DC
  Filled 2023-03-20: qty 9, 90d supply, fill #0

## 2023-03-23 ENCOUNTER — Other Ambulatory Visit: Payer: Self-pay

## 2023-03-23 ENCOUNTER — Ambulatory Visit: Payer: PPO | Admitting: Physical Therapy

## 2023-03-23 MED ORDER — DEXCOM G7 SENSOR MISC
3 refills | Status: DC
  Filled 2023-03-23: qty 9, 84d supply, fill #0
  Filled 2023-03-23: qty 9, 90d supply, fill #0

## 2023-03-24 ENCOUNTER — Ambulatory Visit: Payer: PPO | Admitting: Neurology

## 2023-03-25 ENCOUNTER — Ambulatory Visit: Payer: PPO | Admitting: Physical Therapy

## 2023-03-26 ENCOUNTER — Other Ambulatory Visit: Payer: Self-pay

## 2023-03-26 DIAGNOSIS — M65341 Trigger finger, right ring finger: Secondary | ICD-10-CM | POA: Diagnosis not present

## 2023-03-26 DIAGNOSIS — M65331 Trigger finger, right middle finger: Secondary | ICD-10-CM | POA: Diagnosis not present

## 2023-03-26 MED ORDER — CEPHALEXIN 500 MG PO CAPS
500.0000 mg | ORAL_CAPSULE | Freq: Four times a day (QID) | ORAL | 1 refills | Status: DC
  Filled 2023-03-26: qty 12, 3d supply, fill #0

## 2023-03-27 ENCOUNTER — Other Ambulatory Visit: Payer: Self-pay

## 2023-03-28 ENCOUNTER — Encounter: Payer: Self-pay | Admitting: Gastroenterology

## 2023-03-30 ENCOUNTER — Ambulatory Visit: Payer: PPO | Admitting: Physical Therapy

## 2023-03-30 ENCOUNTER — Other Ambulatory Visit: Payer: Self-pay

## 2023-04-01 ENCOUNTER — Ambulatory Visit: Payer: PPO | Admitting: Physical Therapy

## 2023-04-06 ENCOUNTER — Ambulatory Visit: Payer: PPO | Admitting: Physical Therapy

## 2023-04-08 ENCOUNTER — Ambulatory Visit: Payer: PPO | Admitting: Physical Therapy

## 2023-04-08 DIAGNOSIS — M65331 Trigger finger, right middle finger: Secondary | ICD-10-CM | POA: Diagnosis not present

## 2023-04-08 DIAGNOSIS — E1142 Type 2 diabetes mellitus with diabetic polyneuropathy: Secondary | ICD-10-CM | POA: Diagnosis not present

## 2023-04-13 ENCOUNTER — Ambulatory Visit: Payer: PPO | Admitting: Physical Therapy

## 2023-04-13 ENCOUNTER — Other Ambulatory Visit: Payer: Self-pay

## 2023-04-15 ENCOUNTER — Ambulatory Visit: Payer: PPO | Admitting: Physical Therapy

## 2023-04-20 ENCOUNTER — Ambulatory Visit: Payer: PPO | Admitting: Physical Therapy

## 2023-04-22 ENCOUNTER — Telehealth: Payer: Self-pay | Admitting: Physical Therapy

## 2023-04-22 ENCOUNTER — Ambulatory Visit: Payer: PPO | Attending: Neurology | Admitting: Physical Therapy

## 2023-04-22 NOTE — Therapy (Incomplete)
OUTPATIENT PHYSICAL THERAPY TREATMENT    Patient Name: [name] MRN: [id number] DOB:April 17, 1950, 73 y.o., female Today's Date: 04/22/2023   PCP:  Enid Baas, MD   REFERRING PROVIDER: Morene Crocker, MD   END OF SESSION:       Past Medical History:  Diagnosis Date   Abdominal pain 12/06/2013   Abnormality of gait 01/26/2013   Altered bowel habits 12/11/2017   Bruises easily    Constipation    Crohn's disease (HCC)    Depression    Dizziness and giddiness 01/26/2013   Fecal incontinence alternating with constipation 12/11/2017   GERD (gastroesophageal reflux disease)    Hemianopia    left   Hemiparesis and alteration of sensations as late effects of stroke (HCC) 01/26/2013   Hyperlipidemia    Hypertension    Hypothyroidism    Lump in female breast    Panic disorder    PONV (postoperative nausea and vomiting)    Stroke (HCC) 2009   Tremor, essential 04/24/2020   Type II diabetes mellitus (HCC)    Past Surgical History:  Procedure Laterality Date   ANAL FISSURE REPAIR  2008   APPENDECTOMY  1986   BREAST BIOPSY Left 2006   neg   BREAST LUMPECTOMY Right 1990's   CESAREAN SECTION  1981; 1983   CHOLECYSTECTOMY  1986   SMALL INTESTINE SURGERY  05/1991   TUBAL LIGATION  1983   Patient Active Problem List   Diagnosis Date Noted   Bone spur 06/13/2021   Acute CVA (cerebrovascular accident) (HCC) 03/07/2021   CVA (cerebral vascular accident) (HCC) 11/01/2020   Chronic pain 04/25/2020   Chronic constipation 04/25/2020   Gastro-esophageal reflux disease without esophagitis 04/25/2020   Hardening of the aorta (main artery of the heart) (HCC) 04/25/2020   History of iron deficiency anemia 04/25/2020   Hypercholesteremia 04/25/2020   Hypertension 04/25/2020   Personal history of transient ischemic attack (TIA), and cerebral infarction without residual deficits 04/25/2020   Pulmonary emphysema (HCC) 04/25/2020   Vitamin D deficiency 04/25/2020    Tremor, essential 04/24/2020   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  Rationale for Evaluation and Treatment: Rehabilitation  SUBJECTIVE:  SUBJECTIVE STATEMENT:    Pt reports doing well today. Pt denies any recent falls/stumbles since prior session. Pt denies any updates to medications or medical appointment since prior session. Reprots continued issues with her current AFO.   Pt accompanied by: self  PERTINENT HISTORY: Pt has had one major stroke and 5 mini strokes since 2009. Pt is here since her doctor wants her to work on her strength and balance and she also wants to improve in these categories. Pt has hemianopsia with blindness inferior, left side and superior. Pt initially had stroke in 2009 and was previously able to walk for long distances. Pt has 650 feet driveway that is gravel and used to walk up and down with the cane. Pt has issues with depth perception. Pt is unable to tell angles of concrete and holes in the ground. Pt has service dogs that are retired, one for guidance and one for helping with picking up and retrieving items. Pt had most recent stroke in January 2023 that further limited her field of vision.  Patient wants to work diligently on her strength, balance, mobility, and  in order to improve her ability to ambulate on various surfaces in various scenarios.  Patient wanted to come to outpatient services to utilize equipment to improve her strength and a greater capacity than home health was able to assist her with.  She is doing home exercise program involving mostly standing with upper extremity support and activities to improve her strength.  Patient reports she is very nervous to ambulate without contact-guard assist and gait belt without being able to hold onto a wall or steady surface in order to provide her with tactile cues for the areas around her.  PAIN:  Are you having pain? None  PRECAUTIONS: Fall, hemianopsia  WEIGHT BEARING RESTRICTIONS: No  FALLS: Has patient fallen in last 6 months? No  PATIENT GOALS: Improve gait, improve strength, walk on unsteady surfaces  OBJECTIVE:   TODAY'S TREATMENT:                                                                                                                              DATE: 04/22/23   TA Ambulation x 130 ft with hemiwalker weaving between cones ( cones far apart) working object recognition and navigation  STS 2 x 10 reps without UE assist  Seated HS/ quad roll out with bolster x 10 ea  Standing step through gait part to whole practice ( foll step on one side then return to start position) x 10 ea side  Ambulation with focus on step through gait practice x 70 ft   PATIENT EDUCATION: Education details:Pt educated throughout session about proper posture and technique with exercises. Improved exercise technique, movement at target joints, use of target muscles after min to mod verbal, visual, tactile cues.  Person educated: Patient Education method: Explanation Education comprehension: verbalized understanding  HOME EXERCISE PROGRAM:  Access Code: [email or web address] URL: [email or web address] Date: 11/24/2022 Prepared  by: Thresa Ross  Exercises - Alternating Step Taps with Counter  Support  - 7 x weekly - 2 sets - 10 reps - Sit to Stand  - 1 x daily - 7 x weekly - 2 sets - 10 reps - Standing Tandem Balance with Counter Support  - 1 x daily - 3 x weekly - 2 sets - 2 reps - 10 hold - Standing Single Leg Stance with Counter Support  - 1 x daily - 3 x weekly - 2 sets - 2 reps - 5 hold - Seated Long Arc Quad with Ankle Weight  - 1 x daily - 7 x weekly - 2 sets - 10 reps  Previous: Pt performs leg lifts and squats with UE support    GOALS: Goals reviewed with patient? Yes  SHORT TERM GOALS: Target date: 06/24/2022    Patient will be independent in home exercise program to improve strength/mobility for better functional independence with ADLs. Baseline: No HEP currently 9/12: pt completing HEP regularly.  Goal status: MET  LONG TERM GOALS: Target date: 04/27/2023      1.  Patient (> 53 years old) will complete five times sit to stand test in < 15 seconds indicating an increased LE strength and improved balance. Baseline:  Test second visit ; 05/29/2022: 40 sec pull to stance RUE 07/10/22: 22.82 sec. 08/18/22: 16.36 sec with UE support 09/29/22:19.32 sec, 42 sec without UE assist  9/9:19.86 sec no UE assist  Goal status: ONGOING/ PROGRESSING  2.  Patient will increase FOTO score by 8 or more points  to demonstrate statistically significant improvement in mobility and quality of life.  Baseline: Test second visit; 05/28/2040: 45; 07/10/22: 41, 08/18/22: 45 8/5:48;  1/6:46 Goal status: NOT MET   3.  Patient will increase 10 meter walk test to >.61m/s with hemiwalker as to improve gait speed for better community ambulation and to reduce fall risk. Baseline:  03/01/22: 0.69m/s  Goal status: REVISED  4. Patient will improve ability to ambulate on uneven surfaces as evidenced by ability to ambulate over simulated uneven terrain in clinic to improve independence with ambulation. 9/12: pt ambulates over red mat and blue mat without LOB, still not confident on red mat; 02/02/23: pt does  not wish to continue to work on this goal, no longer meaningful 12/16: Patient still feels is important to work on ambulating on uneven surfaces but mostly surfaces including inclines, declines, and other solid but uneven surfaces.  She reports needs to do this in order to ambulate out of her house as she has a ramp she is currently unable to do this safely and with confidence. Goal status: ONGOING  5.  Pt will ascend and descend 4 stairs with unilateral UE assist and with step through pattern of gait and without any sign of imbalance to improve her community mobility.  Baseline: Pt has R lateral lean with ascending and descending and has LLE internal rotation of foot when stepping down. 02/02/23: continued LLE impairments and presence of rigid AFO discount any possibility of a step-over-step pattern for stairs navigation.  Goal status: DISCONTINUED  6.  Pt will step on and off of curb or simulated curb using SPC in order to allow for improved community mobility.  Baseline: Unable, attempted 9/12; 02/02/23: Pt does not feel this is relevant, does not feel safe AMB overground with device on flat surfaces. 12/16: Patient feels it is more relevant for her to utilize a hemiwalker but does still feel she wants to  have the ability to step on and off a curb or at least a curb cut in a handicap type space 03/02/23: Pt able to navigate with close CGA and increased time to completion at last visit  Goal status: IN PROGRESS    ASSESSMENT:  CLINICAL IMPRESSION:     Patient arrived with good motivation form completion of pt activities.  Pt works toward more functional ambulation with object navigation with her hemiwalker. Pt also shows progress with practice of STS without use of hands. Pt still working on step through pattern of gait and will continue to benefit from part to whole practice of this. Pt will continue to benefit from skilled physical therapy intervention to address impairments, improve QOL, and  attain therapy goals.   Is important to note patient still has continued difficulty with her AFO.  AFO is causing some significant pressure and discomfort on the lateral aspect of her left lower extremity and with her history this could lead to pressure injury.  Patient also has difficulty donning the brace secondary to her left upper extremity spasticity and hemiparesis.  This makes this brace less appropriate for independent donning for ambulation within the home requiring a reliance on caregivers to always present in the home for donning and doffing.  A more appropriate brace would likely be a double upright AFO with a T-strap.  Patient was informed of pros and cons of this kind of brace but it would minimize the pressure and risk of pressure injuries or sores on her lower extremities.  OBJECTIVE IMPAIRMENTS: Abnormal gait, decreased activity tolerance, decreased balance, decreased endurance, decreased strength, decreased safety awareness, hypomobility, impaired flexibility, and impaired UE functional use.   ACTIVITY LIMITATIONS: lifting, bending, standing, stairs, transfers, bed mobility, and locomotion level  PARTICIPATION LIMITATIONS: meal prep, cleaning, shopping, and community activity  PERSONAL FACTORS: Time since onset of injury/illness/exacerbation and 3+ comorbidities: diabetes, HTN, Multiple previous strokes   are also affecting patient's functional outcome.   REHAB POTENTIAL: Fair secondary to time since onset and severely of visual deficits and L UE funcitonal use   CLINICAL DECISION MAKING: Stable/uncomplicated  EVALUATION COMPLEXITY: Low  PLAN:  PT FREQUENCY: 1-2x/week   PT DURATION: 8 weeks   PLANNED INTERVENTIONS: Therapeutic exercises, Therapeutic activity, Neuromuscular re-education, Balance training, Gait training, Patient/Family education, Self Care, Joint mobilization, Stair training, Moist heat, Manual therapy, and Re-evaluation  PLAN FOR NEXT SESSION:  Review  right goals to make them specific towards hemiwalker and improving her efficiency and safety with ambulation with this and submit  a new certification  12:42 PM, 04/22/23 Norman Herrlich PT ,DPT Physical Therapist- [place]  [facility]

## 2023-04-22 NOTE — Telephone Encounter (Signed)
 Called patient to discuss missed appointment and plan for appointment in the future.  Patient reports her hand is still recovering from surgery and she would like to come back on May 18, 2023 in order to allow for further healing of her surgical procedure.  Physical therapist informed scheduling staff to address schedule to this.  Norman Herrlich  ,DPT Physical Therapist- [place]  [facility]

## 2023-04-27 ENCOUNTER — Ambulatory Visit: Payer: PPO | Admitting: Physical Therapy

## 2023-04-27 ENCOUNTER — Other Ambulatory Visit: Payer: Self-pay

## 2023-04-27 ENCOUNTER — Ambulatory Visit: Payer: PPO | Admitting: Podiatry

## 2023-04-27 ENCOUNTER — Other Ambulatory Visit: Payer: Self-pay | Admitting: Dermatology

## 2023-04-27 ENCOUNTER — Encounter: Payer: Self-pay | Admitting: Podiatry

## 2023-04-27 VITALS — Ht 64.0 in | Wt 148.0 lb

## 2023-04-27 DIAGNOSIS — M79674 Pain in right toe(s): Secondary | ICD-10-CM

## 2023-04-27 DIAGNOSIS — Z794 Long term (current) use of insulin: Secondary | ICD-10-CM

## 2023-04-27 DIAGNOSIS — B351 Tinea unguium: Secondary | ICD-10-CM | POA: Diagnosis not present

## 2023-04-27 DIAGNOSIS — M79675 Pain in left toe(s): Secondary | ICD-10-CM

## 2023-04-27 DIAGNOSIS — E1142 Type 2 diabetes mellitus with diabetic polyneuropathy: Secondary | ICD-10-CM

## 2023-04-27 MED ORDER — KETOCONAZOLE 2 % EX CREA
1.0000 | TOPICAL_CREAM | Freq: Two times a day (BID) | CUTANEOUS | 6 refills | Status: DC
  Filled 2023-04-27: qty 60, 30d supply, fill #0
  Filled 2023-08-01: qty 60, 30d supply, fill #1
  Filled 2023-11-18 – 2023-12-10 (×4): qty 60, 30d supply, fill #2

## 2023-04-29 ENCOUNTER — Ambulatory Visit: Payer: PPO | Admitting: Physical Therapy

## 2023-04-29 ENCOUNTER — Other Ambulatory Visit: Payer: Self-pay

## 2023-04-30 ENCOUNTER — Other Ambulatory Visit: Payer: Self-pay

## 2023-04-30 NOTE — Progress Notes (Signed)
 Subjective:  Patient ID: [name], female    DOB: 22-Mar-1950,  MRN: [id number]  [name] presents to clinic today for at risk foot care with history of diabetic neuropathy and painful elongated mycotic toenails 1-5 bilaterally which are tender when wearing enclosed shoe gear. Pain is relieved with periodic professional debridement. She has h/o CVA with left sided weakness. She utilizes wheelchair for mobility. She has an AFO for the LLE, but really like to have an articulated device, but has to wait 5 years for a new device. Chief Complaint  Patient presents with   Nail Problem    Pt is here for [facility] last A1C was 6.9 PCP is Dr Nemiah Commander and LOV was in December.   New problem(s): None.   PCP is Enid Baas, MD.  Allergies  Allergen Reactions   Codeine Other (See Comments)    Syncope.   Morphine And Codeine Nausea And Vomiting and Other (See Comments)    Hallucinations.   Baclofen Nausea Only and Other (See Comments)    dizziness   Penicillins Other (See Comments)    Family history   Sulfamethoxazole Itching    Hands itching    Elemental Sulfur Itching    Per patient happened years ago.   Metformin Hcl Other (See Comments) and Diarrhea    diarrhea    Review of Systems: Negative except as noted in the HPI.  Objective: No changes noted in today's physical examination. There were no vitals filed for this visit. [name] is a pleasant 73 y.o. female WD, WN in NAD. AAO x 3.  Vascular Examination: Capillary refill time immediate b/l. Vascular status intact b/l with palpable pedal pulses. Pedal hair present b/l. No pain with calf compression b/l. Skin temperature gradient WNL b/l. No cyanosis or clubbing b/l. No ischemia or gangrene noted b/l. Trace edema noted BLE.  Neurological Examination: Protective sensation diminished with 10g monofilament b/l.  Dermatological Examination: Pedal skin with normal turgor, texture and tone b/l.  No open wounds. No interdigital  macerations.   Toenails 1-5 b/l thick, discolored, elongated with subungual debris and pain on dorsal palpation.   No hyperkeratotic nor porokeratotic lesions present on today's visit.  Musculoskeletal Examination: Dropfoot left lower extremity. Wearing AFO on left lower extremity. Pes planovalgus deformity noted b/l lower extremities.  Radiographs: None  Assessment/Plan: 1. Pain due to onychomycosis of toenails of both feet   2. Type 2 diabetes mellitus with diabetic polyneuropathy, with long-term current use of insulin (HCC)    Patient was evaluated and treated. All patient's and/or POA's questions/concerns addressed on today's visit. Toenails 1-5 debrided in length and girth without incident. Continue AFO for dropfoot LLE daily. Continue foot and shoe inspections daily. Monitor blood glucose per PCP/Endocrinologist's recommendations. Continue soft, supportive shoe gear daily. Report any pedal injuries to medical professional. Call office if there are any questions/concerns. -Patient/POA to call should there be question/concern in the interim.   Return in about 3 months (around 07/28/2023).  Freddie Breech, DPM      [place] LOCATION: [address] [place] [address]                   Office [phone]   [place] [facility]  LOCATION: [address] [place], [place] [address] Office [phone]

## 2023-05-01 ENCOUNTER — Other Ambulatory Visit: Payer: Self-pay

## 2023-05-04 ENCOUNTER — Ambulatory Visit: Payer: PPO | Admitting: Physical Therapy

## 2023-05-04 ENCOUNTER — Other Ambulatory Visit: Payer: Self-pay

## 2023-05-04 MED ORDER — LEVOTHYROXINE SODIUM 125 MCG PO TABS
125.0000 ug | ORAL_TABLET | Freq: Every day | ORAL | 3 refills | Status: AC
  Filled 2023-05-04: qty 90, 90d supply, fill #0
  Filled 2023-08-01: qty 90, 90d supply, fill #1
  Filled 2023-11-01: qty 90, 90d supply, fill #2
  Filled 2024-01-27: qty 90, 90d supply, fill #3

## 2023-05-05 ENCOUNTER — Other Ambulatory Visit: Payer: Self-pay

## 2023-05-05 DIAGNOSIS — R2681 Unsteadiness on feet: Secondary | ICD-10-CM | POA: Diagnosis not present

## 2023-05-05 DIAGNOSIS — Z8673 Personal history of transient ischemic attack (TIA), and cerebral infarction without residual deficits: Secondary | ICD-10-CM | POA: Diagnosis not present

## 2023-05-05 DIAGNOSIS — R519 Headache, unspecified: Secondary | ICD-10-CM | POA: Diagnosis not present

## 2023-05-05 DIAGNOSIS — R200 Anesthesia of skin: Secondary | ICD-10-CM | POA: Diagnosis not present

## 2023-05-05 DIAGNOSIS — I69359 Hemiplegia and hemiparesis following cerebral infarction affecting unspecified side: Secondary | ICD-10-CM | POA: Diagnosis not present

## 2023-05-05 DIAGNOSIS — R262 Difficulty in walking, not elsewhere classified: Secondary | ICD-10-CM | POA: Diagnosis not present

## 2023-05-05 DIAGNOSIS — Z7409 Other reduced mobility: Secondary | ICD-10-CM | POA: Diagnosis not present

## 2023-05-05 DIAGNOSIS — G250 Essential tremor: Secondary | ICD-10-CM | POA: Diagnosis not present

## 2023-05-05 DIAGNOSIS — R202 Paresthesia of skin: Secondary | ICD-10-CM | POA: Diagnosis not present

## 2023-05-05 DIAGNOSIS — H547 Unspecified visual loss: Secondary | ICD-10-CM | POA: Diagnosis not present

## 2023-05-05 MED ORDER — GABAPENTIN 300 MG PO CAPS
600.0000 mg | ORAL_CAPSULE | Freq: Three times a day (TID) | ORAL | 1 refills | Status: DC
Start: 2023-05-05 — End: 2024-01-07
  Filled 2023-05-05: qty 180, 30d supply, fill #0
  Filled 2023-06-05: qty 180, 30d supply, fill #1

## 2023-05-06 ENCOUNTER — Ambulatory Visit: Payer: PPO | Admitting: Physical Therapy

## 2023-05-06 DIAGNOSIS — E1142 Type 2 diabetes mellitus with diabetic polyneuropathy: Secondary | ICD-10-CM | POA: Diagnosis not present

## 2023-05-09 ENCOUNTER — Other Ambulatory Visit: Payer: Self-pay

## 2023-05-10 ENCOUNTER — Other Ambulatory Visit: Payer: Self-pay

## 2023-05-11 ENCOUNTER — Other Ambulatory Visit: Payer: Self-pay

## 2023-05-11 ENCOUNTER — Ambulatory Visit: Payer: PPO | Admitting: Physical Therapy

## 2023-05-11 MED ORDER — GLUCOSE BLOOD VI STRP
ORAL_STRIP | Freq: Three times a day (TID) | 11 refills | Status: AC
  Filled 2023-05-11: qty 100, 30d supply, fill #0
  Filled 2023-12-09: qty 100, 30d supply, fill #1

## 2023-05-11 MED ORDER — PANTOPRAZOLE SODIUM 40 MG PO TBEC
40.0000 mg | DELAYED_RELEASE_TABLET | Freq: Every morning | ORAL | 1 refills | Status: DC
  Filled 2023-05-11: qty 90, 90d supply, fill #0
  Filled 2023-08-17: qty 90, 90d supply, fill #1

## 2023-05-13 ENCOUNTER — Ambulatory Visit: Payer: PPO | Admitting: Physical Therapy

## 2023-05-14 ENCOUNTER — Encounter: Payer: Self-pay | Admitting: Dermatology

## 2023-05-14 ENCOUNTER — Ambulatory Visit: Payer: PPO | Admitting: Dermatology

## 2023-05-14 DIAGNOSIS — L820 Inflamed seborrheic keratosis: Secondary | ICD-10-CM

## 2023-05-14 NOTE — Progress Notes (Signed)
   Follow-Up Visit   Subjective  [name] is a 73 y.o. female who presents for the following: spot at right upper arm. Present for at least a year, it had a "tail" but that came off. It does get stuck on clothing and causes some discomfort but no bleeding or itching.   The patient has spots, moles and lesions to be evaluated, some may be new or changing and the patient may have concern these could be cancer.   The following portions of the chart were reviewed this encounter and updated as appropriate: medications, allergies, medical history  Review of Systems:  No other skin or systemic complaints except as noted in HPI or Assessment and Plan.  Objective  Well appearing patient in no apparent distress; mood and affect are within normal limits.   A focused examination was performed of the following areas: Right arm  Relevant exam findings are noted in the Assessment and Plan.  Right Upper Arm Erythematous stuck-on, waxy papule or plaque  Assessment & Plan     INFLAMED SEBORRHEIC KERATOSIS Right Upper Arm Symptomatic, irritating, patient would like treated.  Benign-appearing.  Call clinic for new or changing lesions.   Patient to RTC if not resolved in a few months, consider biopsy. Destruction of lesion - Right Upper Arm Complexity: simple   Destruction method: cryotherapy   Informed consent: discussed and consent obtained   Timeout:  patient name, date of birth, surgical site, and procedure verified Lesion destroyed using liquid nitrogen: Yes   Region frozen until ice ball extended beyond lesion: Yes   Cryo cycles: 1 or 2. Outcome: patient tolerated procedure well with no complications   Post-procedure details: wound care instructions given    Return for TBSE, as scheduled, with Dr. Roseanne Reno.  Anise Salvo, RMA, am acting as scribe for Elie Goody, MD .   Documentation: I have reviewed the above documentation for accuracy and completeness, and I agree  with the above.  Elie Goody, MD

## 2023-05-14 NOTE — Patient Instructions (Signed)

## 2023-05-18 ENCOUNTER — Ambulatory Visit: Payer: PPO | Admitting: Physical Therapy

## 2023-05-18 ENCOUNTER — Other Ambulatory Visit: Payer: Self-pay

## 2023-05-18 MED ORDER — RAMIPRIL 2.5 MG PO CAPS
2.5000 mg | ORAL_CAPSULE | Freq: Every day | ORAL | 1 refills | Status: DC
  Filled 2023-05-26: qty 90, 90d supply, fill #0
  Filled 2023-08-17: qty 90, 90d supply, fill #1

## 2023-05-19 ENCOUNTER — Other Ambulatory Visit: Payer: Self-pay

## 2023-05-20 ENCOUNTER — Ambulatory Visit: Payer: PPO | Admitting: Physical Therapy

## 2023-05-25 ENCOUNTER — Ambulatory Visit: Payer: PPO | Admitting: Physical Therapy

## 2023-05-26 ENCOUNTER — Other Ambulatory Visit: Payer: Self-pay

## 2023-05-27 ENCOUNTER — Ambulatory Visit: Payer: PPO | Admitting: Physical Therapy

## 2023-06-01 ENCOUNTER — Ambulatory Visit: Payer: PPO | Attending: Neurology | Admitting: Physical Therapy

## 2023-06-01 DIAGNOSIS — R262 Difficulty in walking, not elsewhere classified: Secondary | ICD-10-CM | POA: Diagnosis not present

## 2023-06-01 DIAGNOSIS — R2681 Unsteadiness on feet: Secondary | ICD-10-CM | POA: Insufficient documentation

## 2023-06-01 DIAGNOSIS — R2689 Other abnormalities of gait and mobility: Secondary | ICD-10-CM | POA: Diagnosis not present

## 2023-06-01 DIAGNOSIS — R269 Unspecified abnormalities of gait and mobility: Secondary | ICD-10-CM | POA: Insufficient documentation

## 2023-06-01 DIAGNOSIS — M6281 Muscle weakness (generalized): Secondary | ICD-10-CM | POA: Insufficient documentation

## 2023-06-01 NOTE — Therapy (Unsigned)
 OUTPATIENT PHYSICAL THERAPY RE-EVAL   Patient Name: [name] MRN: [id number] DOB:[birth year], 73 y.o., female Today's Date: 06/01/2023   PCP:  Enid Baas, MD   REFERRING PROVIDER: Morene Crocker, MD   END OF SESSION:  PT End of Session - 06/01/23 1449     Visit Number 1    Number of Visits 24    Date for PT Re-Evaluation 08/24/23    Authorization Type [employer]    Progress Note Due on Visit 10    PT Start Time 1445    PT Stop Time 1526    PT Time Calculation (min) 41 min    Equipment Utilized During Treatment Gait belt    Activity Tolerance Patient tolerated treatment well;No increased pain                 Past Medical History:  Diagnosis Date   Abdominal pain 12/06/2013   Abnormality of gait 01/26/2013   Altered bowel habits 12/11/2017   Bruises easily    Constipation    Crohn's disease (HCC)    Depression    Dizziness and giddiness 01/26/2013   Fecal incontinence alternating with constipation 12/11/2017   GERD (gastroesophageal reflux disease)    Hemianopia    left   Hemiparesis and alteration of sensations as late effects of stroke (HCC) 01/26/2013   Hyperlipidemia    Hypertension    Hypothyroidism    Lump in female breast    Panic disorder    PONV (postoperative nausea and vomiting)    Stroke (HCC) 2009   Tremor, essential 04/24/2020   Type II diabetes mellitus (HCC)    Past Surgical History:  Procedure Laterality Date   ANAL FISSURE REPAIR  2008   APPENDECTOMY  1986   BREAST BIOPSY Left 2006   neg   BREAST LUMPECTOMY Right 1990's   CESAREAN SECTION  1981; 1983   CHOLECYSTECTOMY  1986   SMALL INTESTINE SURGERY  05/1991   TUBAL LIGATION  1983   Patient Active Problem List   Diagnosis Date Noted   Bone spur 06/13/2021   Acute CVA (cerebrovascular accident) (HCC) 03/07/2021   CVA (cerebral vascular accident) (HCC) 11/01/2020   Chronic pain 04/25/2020   Chronic constipation 04/25/2020   Gastro-esophageal reflux  disease without esophagitis 04/25/2020   Hardening of the aorta (main artery of the heart) (HCC) 04/25/2020   History of iron deficiency anemia 04/25/2020   Hypercholesteremia 04/25/2020   Hypertension 04/25/2020   Personal history of transient ischemic attack (TIA), and cerebral infarction without residual deficits 04/25/2020   Pulmonary emphysema (HCC) 04/25/2020   Vitamin D deficiency 04/25/2020   Tremor, essential 04/24/2020   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  Rationale for Evaluation and Treatment: Rehabilitation  SUBJECTIVE:  SUBJECTIVE STATEMENT:    Pt reports back to PT following hand surgery. PT has not seen pt for almost 3 months, therefore, re-eval and new goals created and assessed. Pt reports her R hand discomfort is much improved. Pt wearing R glove with padded palm. Pt has not been walking much at all and has had a hard time without the use of her R UE during recovery.    Pt accompanied by: self  PERTINENT HISTORY: Pt has had one major stroke and 5 mini strokes since 2009. Pt is here since her doctor wants her to work on her strength and balance and she also wants to improve in these categories. Pt has hemianopsia with blindness inferior, left side and superior. Pt  initially had stroke in 2009 and was previously able to walk for long distances. Pt has 650 feet driveway that is gravel and used to walk up and down with the cane. Pt has issues with depth perception. Pt is unable to tell angles of concrete and holes in the ground. Pt has service dogs that are retired, one for guidance and one for helping with picking up and retrieving items. Pt had most recent stroke in January 2023 that further limited her field of vision.  Patient wants to work diligently on her strength, balance, mobility, and in order to improve her ability to ambulate on various surfaces in various scenarios.  Patient wanted to come to outpatient services to utilize equipment to improve her strength and a greater capacity than home health was able to assist her with.  She is doing home exercise program involving mostly standing with upper extremity support and activities to improve her strength.  Patient reports she is very nervous to ambulate without contact-guard assist and gait belt without being able to hold onto a wall or steady surface in order to provide her with tactile cues for the areas around her.  PAIN:  Are you having pain? None  PRECAUTIONS: Fall, hemianopsia  WEIGHT BEARING RESTRICTIONS: No  FALLS: Has patient fallen in last 6 months? No  PATIENT GOALS: Improve gait, improve strength, walk on unsteady surfaces  OBJECTIVE:   TODAY'S TREATMENT:                                                                                                                              DATE: 06/01/23   TA Ambulation x 130 ft with hemiwalker weaving between cones ( cones far apart) working object recognition and navigation  STS 2 x 10 reps without UE assist  Seated HS/ quad roll out with bolster x 10 ea  Standing step through gait part to whole practice ( foll step on one side then return to start position) x 10 ea side  Ambulation with focus on step through gait practice x 70 ft   PATIENT  EDUCATION: Education details:Pt educated throughout session about proper posture and technique with exercises. Improved exercise technique, movement at target joints, use of  target muscles after min to mod verbal, visual, tactile cues.  Person educated: Patient Education method: Explanation Education comprehension: verbalized understanding  HOME EXERCISE PROGRAM:  Access Code: [email or web address] URL: [email or web address] Date: 11/24/2022 Prepared by: Thresa Ross  Exercises - Alternating Step Taps with Counter Support  - 7 x weekly - 2 sets - 10 reps - Sit to Stand  - 1 x daily - 7 x weekly - 2 sets - 10 reps - Standing Tandem Balance with Counter Support  - 1 x daily - 3 x weekly - 2 sets - 2 reps - 10 hold - Standing Single Leg Stance with Counter Support  - 1 x daily - 3 x weekly - 2 sets - 2 reps - 5 hold - Seated Long Arc Quad with Ankle Weight  - 1 x daily - 7 x weekly - 2 sets - 10 reps  Previous: Pt performs leg lifts and squats with UE support    GOALS: Goals reviewed with patient? Yes  SHORT TERM GOALS: Target date: 06/24/2022    Patient will be independent in home exercise program to improve strength/mobility for better functional independence with ADLs. Baseline: No HEP currently 9/12: pt completing HEP regularly.  Goal status: MET  LONG TERM GOALS: Target date: 04/27/2023      1.  Patient (> 52 years old) will complete five times sit to stand test in < 15 seconds indicating an increased LE strength and improved balance. Baseline:  73.7 sec, Min A to prevent post LOB Goal status: NEW  2.  Pt will improve Stroke Impact Scale 16 by 8 points or greater to show progress with functional activities as it relates to her CVA.  Baseline: 45 Goal status: INITIAL   3.  Patient will increase 10 meter walk test to >.68m/s with hemiwalker as to improve gait speed for better community ambulation and to reduce fall risk. Baseline:  03/01/22: 0.71m/s  Goal status:  REVISED  4.  Pt will improve TUG score with hemiwalker to 50 sec or less in order to indicate improved balance with functional tasks  Baseline: *** Goal status: INITIAL   5.  Pt will ascend and descend 4 stairs with unilateral UE assist and without any sign of imbalance to improve her community mobility.  Baseline: Pt has R lateral lean with ascending and descending and has LLE internal rotation of foot when stepping down. 02/02/23: continued LLE impairments and presence of rigid AFO discount any possibility of a step-over-step pattern for stairs navigation.  Goal status: NEW  6.  Pt will step on and off of curb or simulated curb using SPC in order to allow for improved community mobility.  Baseline:  Goal status: IN PROGRESS    ASSESSMENT:  CLINICAL IMPRESSION:     Is important to note patient still has continued difficulty with her AFO.  AFO is causing some significant pressure and discomfort on the lateral aspect of her left lower extremity and with her history this could lead to pressure injury.  Patient also has difficulty donning the brace secondary to her left upper extremity spasticity and hemiparesis.  This makes this brace less appropriate for independent donning for ambulation within the home requiring a reliance on caregivers to always present in the home for donning and doffing.  A more appropriate brace would likely be a double upright AFO with a T-strap.  Patient was informed of pros and cons of this kind of brace but it would minimize the pressure and risk of pressure  injuries or sores on her lower extremities.  OBJECTIVE IMPAIRMENTS: Abnormal gait, decreased activity tolerance, decreased balance, decreased endurance, decreased strength, decreased safety awareness, hypomobility, impaired flexibility, and impaired UE functional use.   ACTIVITY LIMITATIONS: lifting, bending, standing, stairs, transfers, bed mobility, and locomotion level  PARTICIPATION LIMITATIONS: meal prep,  cleaning, shopping, and community activity  PERSONAL FACTORS: Time since onset of injury/illness/exacerbation and 3+ comorbidities: diabetes, HTN, Multiple previous strokes   are also affecting patient's functional outcome.   REHAB POTENTIAL: Fair secondary to time since onset and severely of visual deficits and L UE funcitonal use   CLINICAL DECISION MAKING: Stable/uncomplicated  EVALUATION COMPLEXITY: Low  PLAN:  PT FREQUENCY: 1-2x/week   PT DURATION: 8 weeks   PLANNED INTERVENTIONS: Therapeutic exercises, Therapeutic activity, Neuromuscular re-education, Balance training, Gait training, Patient/Family education, Self Care, Joint mobilization, Stair training, Moist heat, Manual therapy, and Re-evaluation  PLAN FOR NEXT SESSION:  Review right goals to make them specific towards hemiwalker and improving her efficiency and safety with ambulation with this and submit  a new certification  2:50 PM, 06/01/23 Norman Herrlich PT ,DPT Physical Therapist- [place]  [facility]

## 2023-06-03 ENCOUNTER — Ambulatory Visit: Payer: PPO | Admitting: Physical Therapy

## 2023-06-06 DIAGNOSIS — E1142 Type 2 diabetes mellitus with diabetic polyneuropathy: Secondary | ICD-10-CM | POA: Diagnosis not present

## 2023-06-08 ENCOUNTER — Ambulatory Visit: Payer: PPO | Admitting: Physical Therapy

## 2023-06-08 DIAGNOSIS — R2681 Unsteadiness on feet: Secondary | ICD-10-CM

## 2023-06-08 DIAGNOSIS — M6281 Muscle weakness (generalized): Secondary | ICD-10-CM

## 2023-06-08 DIAGNOSIS — R269 Unspecified abnormalities of gait and mobility: Secondary | ICD-10-CM

## 2023-06-08 DIAGNOSIS — R262 Difficulty in walking, not elsewhere classified: Secondary | ICD-10-CM | POA: Diagnosis not present

## 2023-06-08 DIAGNOSIS — R2689 Other abnormalities of gait and mobility: Secondary | ICD-10-CM

## 2023-06-08 NOTE — Therapy (Signed)
 OUTPATIENT PHYSICAL THERAPY RE-EVAL   Patient Name: [name] MRN: [id number] DOB:[birth year], 73 y.o., female Today's Date: 06/08/2023   PCP:  Enid Baas, MD   REFERRING PROVIDER: Morene Crocker, MD   END OF SESSION:  PT End of Session - 06/08/23 1612     Visit Number 2    Number of Visits 24    Date for PT Re-Evaluation 08/24/23    Authorization Type [employer]    Progress Note Due on Visit 10    PT Start Time 1445    PT Stop Time 1528    PT Time Calculation (min) 43 min    Equipment Utilized During Treatment Gait belt    Activity Tolerance Patient tolerated treatment well;No increased pain                  Past Medical History:  Diagnosis Date   Abdominal pain 12/06/2013   Abnormality of gait 01/26/2013   Altered bowel habits 12/11/2017   Bruises easily    Constipation    Crohn's disease (HCC)    Depression    Dizziness and giddiness 01/26/2013   Fecal incontinence alternating with constipation 12/11/2017   GERD (gastroesophageal reflux disease)    Hemianopia    left   Hemiparesis and alteration of sensations as late effects of stroke (HCC) 01/26/2013   Hyperlipidemia    Hypertension    Hypothyroidism    Lump in female breast    Panic disorder    PONV (postoperative nausea and vomiting)    Stroke (HCC) 2009   Tremor, essential 04/24/2020   Type II diabetes mellitus (HCC)    Past Surgical History:  Procedure Laterality Date   ANAL FISSURE REPAIR  2008   APPENDECTOMY  1986   BREAST BIOPSY Left 2006   neg   BREAST LUMPECTOMY Right 1990's   CESAREAN SECTION  1981; 1983   CHOLECYSTECTOMY  1986   SMALL INTESTINE SURGERY  05/1991   TUBAL LIGATION  1983   Patient Active Problem List   Diagnosis Date Noted   Bone spur 06/13/2021   Acute CVA (cerebrovascular accident) (HCC) 03/07/2021   CVA (cerebral vascular accident) (HCC) 11/01/2020   Chronic pain 04/25/2020   Chronic constipation 04/25/2020   Gastro-esophageal  reflux disease without esophagitis 04/25/2020   Hardening of the aorta (main artery of the heart) (HCC) 04/25/2020   History of iron deficiency anemia 04/25/2020   Hypercholesteremia 04/25/2020   Hypertension 04/25/2020   Personal history of transient ischemic attack (TIA), and cerebral infarction without residual deficits 04/25/2020   Pulmonary emphysema (HCC) 04/25/2020   Vitamin D deficiency 04/25/2020   Tremor, essential 04/24/2020   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   Right lower quadrant abdominal pain 08/18/2018   Spastic hemiplegia affecting nondominant side (HCC) 02/03/2014   Diabetes mellitus (HCC) 09/07/2013   Hypothyroidism 09/07/2013   Type 2 diabetes mellitus without complications (HCC) 09/07/2013   Hemiparesis and alteration of sensations as late effects of stroke (HCC) 01/26/2013   Unsteady gait 01/26/2013   Dizziness and giddiness 01/26/2013    ONSET DATE: 2009  REFERRING DIAG: Z86.73 (ICD-10-CM) - History of stroke   THERAPY DIAG:  Difficulty in walking, not elsewhere classified  Other abnormalities of gait and mobility  Unsteadiness on feet  Abnormality of gait and mobility  Muscle weakness (generalized)  Rationale for Evaluation and Treatment: Rehabilitation  SUBJECTIVE:  SUBJECTIVE STATEMENT:    Patient reports she was sore after last visit but overall she is doing well at this time.  Pt accompanied by: self  PERTINENT HISTORY: Pt has had one major stroke and 5 mini strokes since 2009. Pt is here since her doctor wants her to work on her strength and balance and she also wants to improve in these categories. Pt has hemianopsia with blindness inferior, left side and superior. Pt initially had stroke in 2009 and was previously able to walk for long distances.  Pt has 650 feet driveway that is gravel and used to walk up and down with the cane. Pt has issues with depth perception. Pt is unable to tell angles of concrete and holes in the ground. Pt has service dogs that are retired, one for guidance and one for helping with picking up and retrieving items. Pt had most recent stroke in January 2023 that further limited her field of vision.  Patient wants to work diligently on her strength, balance, mobility, and in order to improve her ability to ambulate on various surfaces in various scenarios.  Patient wanted to come to outpatient services to utilize equipment to improve her strength and a greater capacity than home health was able to assist her with.  She is doing home exercise program involving mostly standing with upper extremity support and activities to improve her strength.  Patient reports she is very nervous to ambulate without contact-guard assist and gait belt without being able to hold onto a wall or steady surface in order to provide her with tactile cues for the areas around her.  PAIN:  Are you having pain? None  PRECAUTIONS: Fall, hemianopsia  WEIGHT BEARING RESTRICTIONS: No  FALLS: Has patient fallen in last 6 months? No  PATIENT GOALS: Improve gait, improve strength, walk on unsteady surfaces  OBJECTIVE:   TODAY'S TREATMENT:                                                                                                                              DATE: 06/08/23  TA  : .201 m/s with hemiwalker , < 41m/s for community ambulator   Ambulation to chair with cues for step through gait   STS 2 x 5 reps   Ambulation with hemiwalker 2 steps x 50 feet  Stair assessment: Patient descends with step to gait pattern utilizing right upper extremity support, with descending stairs patient has significant difficulty showing cautionary behavior and also has very narrow base of support particularly when stepping down with the left lower  extremity.  Able to complete safely but did require close contact-guard assist for safety  Ambulation with hemiwalker cues for step through gait pattern when possible x 40 feet  Practiced with stair descent standing on step in parallel bars and then eccentrically lowering left lower extremity into floor with external cue to prevent narrow base of support when stepping down.  Patient does tend to use narrow  base of support likely because she has hemianopsia x 12 reps  Standing left hip ABD 2 x 10 reps      PATIENT EDUCATION: Education details:Pt educated throughout session about proper posture and technique with exercises. Improved exercise technique, movement at target joints, use of target muscles after min to mod verbal, visual, tactile cues.  Person educated: Patient Education method: Explanation Education comprehension: verbalized understanding  HOME EXERCISE PROGRAM:  Access Code: [email or web address] URL: [email or web address] Date: 11/24/2022 Prepared by: Marlynn Singer  Exercises - Alternating Step Taps with Counter Support  - 7 x weekly - 2 sets - 10 reps - Sit to Stand  - 1 x daily - 7 x weekly - 2 sets - 10 reps - Standing Tandem Balance with Counter Support  - 1 x daily - 3 x weekly - 2 sets - 2 reps - 10 hold - Standing Single Leg Stance with Counter Support  - 1 x daily - 3 x weekly - 2 sets - 2 reps - 5 hold - Seated Long Arc Quad with Ankle Weight  - 1 x daily - 7 x weekly - 2 sets - 10 reps  Previous: Pt performs leg lifts and squats with UE support    GOALS: Goals reviewed with patient? Yes  SHORT TERM GOALS: Target date: 06/29/2023      Patient will be independent in home exercise program to improve strength/mobility for better functional independence with ADLs. Baseline: No HEP currently 9/12: pt completing HEP regularly.  Goal status: MET  LONG TERM GOALS: Target date: 08/24/2023        1.  Patient (> 89 years old) will complete five times  sit to stand test in < 15 seconds indicating an increased LE strength and improved balance. Baseline:  73.7 sec, Min A to prevent post LOB Goal status: NEW  2.  Pt will improve Stroke Impact Scale 16 by 8 points or greater to show progress with functional activities as it relates to her CVA.  Baseline: 45 Goal status: INITIAL   3.  Patient will increase 10 meter walk test to >.30m/s with hemiwalker as to improve gait speed for better community ambulation and to reduce fall risk. Baseline: .201 m/s with hemiwalker Goal status: REVISED  4.  Pt will improve TUG score with hemiwalker to 50 sec or less in order to indicate improved balance with functional tasks  Baseline: 71 sec Goal status: INITIAL   5.  Pt will ascend and descend 4 stairs with unilateral UE assist and without any sign of imbalance to improve her community mobility.  Baseline: Patient descends with step to gait pattern utilizing right upper extremity support, with descending stairs patient has significant difficulty showing cautionary behavior and also has very narrow base of support particularly when stepping down with the left lower extremity. Goal status: NEW  6.  Pt will step on and off of curb or simulated curb using LRAD  in order to allow for improved community mobility.  Baseline: Patient able to a send and descend 1 step with questions during activity of which lower extremity use as well as only able to complete with support bar. Goal status: IN PROGRESS    ASSESSMENT:  CLINICAL IMPRESSION:    Patient presents to therapy following prolonged break from PT as she recovered from right hand surgery.  Patient presents now with some deconditioning as a result of inactivity.  Patient was unable to complete much exercise because she relies on her right upper extremity/surgical hand  to give her support with ambulation.  Now that her hand is recovering she is not able to complete in physical therapy interventions.  Patient  continues to display significant lower extremity strength deficits as evidenced by 5 times sit to stand as well as significant risk factors for falls as evidenced by timed up and go.  Patient would like to work toward goals of safely a sitting and descending stairs, acing and descending curbs, as well as ascending and descending inclines and declines with improved efficiency.  Will need to assess patient's 10 m walk test as well as stair a send and descend during next physical therapy session.  Following this we will continue to work on functional ambulation as well as functional strengthening in order to improve patient's mobility and quality of life.   Is important to note patient still has continued difficulty with her AFO.  AFO is causing some significant pressure and discomfort on the lateral aspect of her left lower extremity and with her history this could lead to pressure injury.  Patient also has difficulty donning the brace secondary to her left upper extremity spasticity and hemiparesis.  This makes this brace less appropriate for independent donning for ambulation within the home requiring a reliance on caregivers to always present in the home for donning and doffing.  A more appropriate brace would likely be a double upright AFO with a T-strap.  Patient was informed of pros and cons of this kind of brace but it would minimize the pressure and risk of pressure injuries or sores on her lower extremities.  OBJECTIVE IMPAIRMENTS: Abnormal gait, decreased activity tolerance, decreased balance, decreased endurance, decreased strength, decreased safety awareness, hypomobility, impaired flexibility, and impaired UE functional use.   ACTIVITY LIMITATIONS: lifting, bending, standing, stairs, transfers, bed mobility, and locomotion level  PARTICIPATION LIMITATIONS: meal prep, cleaning, shopping, and community activity  PERSONAL FACTORS: Time since onset of injury/illness/exacerbation and 3+  comorbidities: diabetes, HTN, Multiple previous strokes   are also affecting patient's functional outcome.   REHAB POTENTIAL: Fair secondary to time since onset and severely of visual deficits and L UE funcitonal use   CLINICAL DECISION MAKING: Stable/uncomplicated  EVALUATION COMPLEXITY: Low  PLAN:  PT FREQUENCY: 1-2x/week   PT DURATION: 8 weeks   PLANNED INTERVENTIONS: 97164- PT Re-evaluation, 97110-Therapeutic exercises, 97530- Therapeutic activity, 97112- Neuromuscular re-education, 97535- Self Care, 62130- Manual therapy, [phone]- Gait training, Patient/Family education, Balance training, Stair training, Joint mobilization, and Moist heat  PLAN FOR NEXT SESSION:  Ambulation outside when possible  Stair practice ( step to pattern with descent due to AFO limitations)   4:12 PM, 06/08/23 Edwina Gram PT ,DPT Physical Therapist- [place]  [facility]

## 2023-06-10 ENCOUNTER — Ambulatory Visit: Payer: PPO | Admitting: Physical Therapy

## 2023-06-10 DIAGNOSIS — R262 Difficulty in walking, not elsewhere classified: Secondary | ICD-10-CM

## 2023-06-10 DIAGNOSIS — M6281 Muscle weakness (generalized): Secondary | ICD-10-CM

## 2023-06-10 DIAGNOSIS — R2689 Other abnormalities of gait and mobility: Secondary | ICD-10-CM

## 2023-06-10 DIAGNOSIS — R269 Unspecified abnormalities of gait and mobility: Secondary | ICD-10-CM

## 2023-06-10 DIAGNOSIS — R2681 Unsteadiness on feet: Secondary | ICD-10-CM

## 2023-06-10 NOTE — Therapy (Signed)
 OUTPATIENT PHYSICAL THERAPY RE-EVAL   Patient Name: [name] MRN: [id number] DOB:1950/09/12, 73 y.o., female Today's Date: 06/10/2023   PCP:  Enid Baas, MD   REFERRING PROVIDER: Morene Crocker, MD   END OF SESSION:  PT End of Session - 06/10/23 1452     Visit Number 3    Number of Visits 24    Date for PT Re-Evaluation 08/24/23    Authorization Type [employer]    Progress Note Due on Visit 10    PT Start Time 1445    PT Stop Time 1527    PT Time Calculation (min) 42 min    Equipment Utilized During Treatment Gait belt    Activity Tolerance Patient tolerated treatment well;No increased pain    Behavior During Therapy [facility] for tasks assessed/performed                   Past Medical History:  Diagnosis Date   Abdominal pain 12/06/2013   Abnormality of gait 01/26/2013   Altered bowel habits 12/11/2017   Bruises easily    Constipation    Crohn's disease (HCC)    Depression    Dizziness and giddiness 01/26/2013   Fecal incontinence alternating with constipation 12/11/2017   GERD (gastroesophageal reflux disease)    Hemianopia    left   Hemiparesis and alteration of sensations as late effects of stroke (HCC) 01/26/2013   Hyperlipidemia    Hypertension    Hypothyroidism    Lump in female breast    Panic disorder    PONV (postoperative nausea and vomiting)    Stroke (HCC) 2009   Tremor, essential 04/24/2020   Type II diabetes mellitus (HCC)    Past Surgical History:  Procedure Laterality Date   ANAL FISSURE REPAIR  2008   APPENDECTOMY  1986   BREAST BIOPSY Left 2006   neg   BREAST LUMPECTOMY Right 1990's   CESAREAN SECTION  1981; 1983   CHOLECYSTECTOMY  1986   SMALL INTESTINE SURGERY  05/1991   TUBAL LIGATION  1983   Patient Active Problem List   Diagnosis Date Noted   Bone spur 06/13/2021   Acute CVA (cerebrovascular accident) (HCC) 03/07/2021   CVA (cerebral vascular accident) (HCC) 11/01/2020   Chronic pain  04/25/2020   Chronic constipation 04/25/2020   Gastro-esophageal reflux disease without esophagitis 04/25/2020   Hardening of the aorta (main artery of the heart) (HCC) 04/25/2020   History of iron deficiency anemia 04/25/2020   Hypercholesteremia 04/25/2020   Hypertension 04/25/2020   Personal history of transient ischemic attack (TIA), and cerebral infarction without residual deficits 04/25/2020   Pulmonary emphysema (HCC) 04/25/2020   Vitamin D deficiency 04/25/2020   Tremor, essential 04/24/2020   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   Right lower quadrant abdominal pain 08/18/2018   Spastic hemiplegia affecting nondominant side (HCC) 02/03/2014   Diabetes mellitus (HCC) 09/07/2013   Hypothyroidism 09/07/2013   Type 2 diabetes mellitus without complications (HCC) 09/07/2013   Hemiparesis and alteration of sensations as late effects of stroke (HCC) 01/26/2013   Unsteady gait 01/26/2013   Dizziness and giddiness 01/26/2013    ONSET DATE: 2009  REFERRING DIAG: Z86.73 (ICD-10-CM) - History of stroke   THERAPY DIAG:  Difficulty in walking, not elsewhere classified  Other abnormalities of gait and mobility  Unsteadiness on feet  Abnormality of gait and mobility  Muscle weakness (generalized)  Rationale for Evaluation and Treatment: Rehabilitation  SUBJECTIVE:  SUBJECTIVE STATEMENT:    Patient reports she has had some difficulty with what she thinks is her core musculature when she is putting on her shoes and feeling off balance.  Patient reports less soreness following last visit.  Pt accompanied by: self  PERTINENT HISTORY: Pt has had one major stroke and 5 mini strokes since 2009. Pt is here since her doctor wants her to work on her strength and balance and she also wants to  improve in these categories. Pt has hemianopsia with blindness inferior, left side and superior. Pt initially had stroke in 2009 and was previously able to walk for long distances. Pt has 650 feet driveway that is gravel and used to walk up and down with the cane. Pt has issues with depth perception. Pt is unable to tell angles of concrete and holes in the ground. Pt has service dogs that are retired, one for guidance and one for helping with picking up and retrieving items. Pt had most recent stroke in January 2023 that further limited her field of vision.  Patient wants to work diligently on her strength, balance, mobility, and in order to improve her ability to ambulate on various surfaces in various scenarios.  Patient wanted to come to outpatient services to utilize equipment to improve her strength and a greater capacity than home health was able to assist her with.  She is doing home exercise program involving mostly standing with upper extremity support and activities to improve her strength.  Patient reports she is very nervous to ambulate without contact-guard assist and gait belt without being able to hold onto a wall or steady surface in order to provide her with tactile cues for the areas around her.  PAIN:  Are you having pain? None  PRECAUTIONS: Fall, hemianopsia  WEIGHT BEARING RESTRICTIONS: No  FALLS: Has patient fallen in last 6 months? No  PATIENT GOALS: Improve gait, improve strength, walk on unsteady surfaces  OBJECTIVE:   TODAY'S TREATMENT:                                                                                                                              DATE: 06/10/23  TA  STS x 10 reps, no UE, cues for eccentric control with use of hips and trunk focus for control.   Gait with hemiwalker with cues for large R LE step length x80 ft   Seated postural control exercise on dynadisc x 60 sec  Seated on dynadisc marching 2 x 10 ea side, high difficulty with this  task, post LOB common  Step taps 2 x 10 ea LE  Ascending and descending steps x 4 steps, cues to keep L foot position from adducted stance on descent. Step to pattern only   Stand pivot transfer to personal WC to end session      PATIENT EDUCATION: Education details:Pt educated throughout session about proper posture and technique with exercises. Improved exercise technique, movement at target joints,  use of target muscles after min to mod verbal, visual, tactile cues.  Person educated: Patient Education method: Explanation Education comprehension: verbalized understanding  HOME EXERCISE PROGRAM:  Access Code: [email or web address] URL: [email or web address] Date: 11/24/2022 Prepared by: Thresa Ross  Exercises - Alternating Step Taps with Counter Support  - 7 x weekly - 2 sets - 10 reps - Sit to Stand  - 1 x daily - 7 x weekly - 2 sets - 10 reps - Standing Tandem Balance with Counter Support  - 1 x daily - 3 x weekly - 2 sets - 2 reps - 10 hold - Standing Single Leg Stance with Counter Support  - 1 x daily - 3 x weekly - 2 sets - 2 reps - 5 hold - Seated Long Arc Quad with Ankle Weight  - 1 x daily - 7 x weekly - 2 sets - 10 reps  Previous: Pt performs leg lifts and squats with UE support    GOALS: Goals reviewed with patient? Yes  SHORT TERM GOALS: Target date: 06/29/2023      Patient will be independent in home exercise program to improve strength/mobility for better functional independence with ADLs. Baseline: No HEP currently 9/12: pt completing HEP regularly.  Goal status: MET  LONG TERM GOALS: Target date: 08/24/2023        1.  Patient (> 31 years old) will complete five times sit to stand test in < 15 seconds indicating an increased LE strength and improved balance. Baseline:  73.7 sec, Min A to prevent post LOB Goal status: NEW  2.  Pt will improve Stroke Impact Scale 16 by 8 points or greater to show progress with functional activities as it  relates to her CVA.  Baseline: 45 Goal status: INITIAL   3.  Patient will increase 10 meter walk test to >.70m/s with hemiwalker as to improve gait speed for better community ambulation and to reduce fall risk. Baseline: .201 m/s with hemiwalker Goal status: REVISED  4.  Pt will improve TUG score with hemiwalker to 50 sec or less in order to indicate improved balance with functional tasks  Baseline: 71 sec Goal status: INITIAL   5.  Pt will ascend and descend 4 stairs with unilateral UE assist and without any sign of imbalance to improve her community mobility.  Baseline: Patient descends with step to gait pattern utilizing right upper extremity support, with descending stairs patient has significant difficulty showing cautionary behavior and also has very narrow base of support particularly when stepping down with the left lower extremity. Goal status: NEW  6.  Pt will step on and off of curb or simulated curb using LRAD  in order to allow for improved community mobility.  Baseline: Patient able to a send and descend 1 step with questions during activity of which lower extremity use as well as only able to complete with support bar. Goal status: IN PROGRESS    ASSESSMENT:  CLINICAL IMPRESSION:    Patient presents with good motivation for completion of physical therapy activities.  Patient continues with dynamic activities to improve her gait capacity and efficiency.  Patient also continues with activities to improve ability to navigate stairs as well as her general core strength for normal everyday activities within her home.  Patient will continue to benefit from skilled physical therapy to continue to progress her gait and safety with everyday activities. Pt will continue to benefit from skilled physical therapy intervention to address impairments, improve QOL, and attain therapy goals.  Is important to note patient still has continued difficulty with her AFO.  AFO is causing some  significant pressure and discomfort on the lateral aspect of her left lower extremity and with her history this could lead to pressure injury.  Patient also has difficulty donning the brace secondary to her left upper extremity spasticity and hemiparesis.  This makes this brace less appropriate for independent donning for ambulation within the home requiring a reliance on caregivers to always present in the home for donning and doffing.  A more appropriate brace would likely be a double upright AFO with a T-strap.  Patient was informed of pros and cons of this kind of brace but it would minimize the pressure and risk of pressure injuries or sores on her lower extremities.  OBJECTIVE IMPAIRMENTS: Abnormal gait, decreased activity tolerance, decreased balance, decreased endurance, decreased strength, decreased safety awareness, hypomobility, impaired flexibility, and impaired UE functional use.   ACTIVITY LIMITATIONS: lifting, bending, standing, stairs, transfers, bed mobility, and locomotion level  PARTICIPATION LIMITATIONS: meal prep, cleaning, shopping, and community activity  PERSONAL FACTORS: Time since onset of injury/illness/exacerbation and 3+ comorbidities: diabetes, HTN, Multiple previous strokes   are also affecting patient's functional outcome.   REHAB POTENTIAL: Fair secondary to time since onset and severely of visual deficits and L UE funcitonal use   CLINICAL DECISION MAKING: Stable/uncomplicated  EVALUATION COMPLEXITY: Low  PLAN:  PT FREQUENCY: 1-2x/week   PT DURATION: 8 weeks   PLANNED INTERVENTIONS: 97164- PT Re-evaluation, 97110-Therapeutic exercises, 97530- Therapeutic activity, 97112- Neuromuscular re-education, 97535- Self Care, [id number]- Manual therapy, [phone]- Gait training, Patient/Family education, Balance training, Stair training, Joint mobilization, and Moist heat  PLAN FOR NEXT SESSION:  Ambulation outside when possible  Stair practice ( step to pattern with descent  due to AFO limitations)   4:05 PM, 06/10/23 Edwina Gram PT ,DPT Physical Therapist- [place]  [facility]

## 2023-06-15 ENCOUNTER — Ambulatory Visit: Payer: PPO | Admitting: Physical Therapy

## 2023-06-15 DIAGNOSIS — M6281 Muscle weakness (generalized): Secondary | ICD-10-CM

## 2023-06-15 DIAGNOSIS — R2689 Other abnormalities of gait and mobility: Secondary | ICD-10-CM

## 2023-06-15 DIAGNOSIS — R269 Unspecified abnormalities of gait and mobility: Secondary | ICD-10-CM

## 2023-06-15 DIAGNOSIS — R2681 Unsteadiness on feet: Secondary | ICD-10-CM

## 2023-06-15 DIAGNOSIS — R262 Difficulty in walking, not elsewhere classified: Secondary | ICD-10-CM

## 2023-06-15 NOTE — Therapy (Signed)
 OUTPATIENT PHYSICAL THERAPY TREATMENT   Patient Name: [name] MRN: [id number] DOB:06/15/1950, 73 y.o., female Today's Date: 06/15/2023   PCP:  Rex Castor, MD   REFERRING PROVIDER: Bufford Carne, MD   END OF SESSION:  PT End of Session - 06/15/23 1455     Visit Number 4    Number of Visits 24    Date for PT Re-Evaluation 08/24/23    Authorization Type [employer]    Progress Note Due on Visit 10    PT Start Time 0245    PT Stop Time 0327    PT Time Calculation (min) 42 min    Equipment Utilized During Treatment Gait belt    Activity Tolerance Patient tolerated treatment well;No increased pain    Behavior During Therapy [facility] for tasks assessed/performed             Past Medical History:  Diagnosis Date   Abdominal pain 12/06/2013   Abnormality of gait 01/26/2013   Altered bowel habits 12/11/2017   Bruises easily    Constipation    Crohn's disease (HCC)    Depression    Dizziness and giddiness 01/26/2013   Fecal incontinence alternating with constipation 12/11/2017   GERD (gastroesophageal reflux disease)    Hemianopia    left   Hemiparesis and alteration of sensations as late effects of stroke (HCC) 01/26/2013   Hyperlipidemia    Hypertension    Hypothyroidism    Lump in female breast    Panic disorder    PONV (postoperative nausea and vomiting)    Stroke (HCC) 2009   Tremor, essential 04/24/2020   Type II diabetes mellitus (HCC)    Past Surgical History:  Procedure Laterality Date   ANAL FISSURE REPAIR  2008   APPENDECTOMY  1986   BREAST BIOPSY Left 2006   neg   BREAST LUMPECTOMY Right 1990's   CESAREAN SECTION  1981; 1983   CHOLECYSTECTOMY  1986   SMALL INTESTINE SURGERY  05/1991   TUBAL LIGATION  1983   Patient Active Problem List   Diagnosis Date Noted   Bone spur 06/13/2021   Acute CVA (cerebrovascular accident) (HCC) 03/07/2021   CVA (cerebral vascular accident) (HCC) 11/01/2020   Chronic pain 04/25/2020    Chronic constipation 04/25/2020   Gastro-esophageal reflux disease without esophagitis 04/25/2020   Hardening of the aorta (main artery of the heart) (HCC) 04/25/2020   History of iron deficiency anemia 04/25/2020   Hypercholesteremia 04/25/2020   Hypertension 04/25/2020   Personal history of transient ischemic attack (TIA), and cerebral infarction without residual deficits 04/25/2020   Pulmonary emphysema (HCC) 04/25/2020   Vitamin D deficiency 04/25/2020   Tremor, essential 04/24/2020   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   Right lower quadrant abdominal pain 08/18/2018   Spastic hemiplegia affecting nondominant side (HCC) 02/03/2014   Diabetes mellitus (HCC) 09/07/2013   Hypothyroidism 09/07/2013   Type 2 diabetes mellitus without complications (HCC) 09/07/2013   Hemiparesis and alteration of sensations as late effects of stroke (HCC) 01/26/2013   Unsteady gait 01/26/2013   Dizziness and giddiness 01/26/2013    ONSET DATE: 2009  REFERRING DIAG: Z86.73 (ICD-10-CM) - History of stroke   THERAPY DIAG:  Difficulty in walking, not elsewhere classified  Other abnormalities of gait and mobility  Unsteadiness on feet  Abnormality of gait and mobility  Muscle weakness (generalized)  Rationale for Evaluation and Treatment: Rehabilitation  SUBJECTIVE:  SUBJECTIVE STATEMENT:    Pt reports no changes since last session. Has been doing the seated march for core control and it is challenging at times and other times easy.   Pt accompanied by: self  PERTINENT HISTORY: Pt has had one major stroke and 5 mini strokes since 2009. Pt is here since her doctor wants her to work on her strength and balance and she also wants to improve in these categories. Pt has hemianopsia with blindness inferior,  left side and superior. Pt initially had stroke in 2009 and was previously able to walk for long distances. Pt has 650 feet driveway that is gravel and used to walk up and down with the cane. Pt has issues with depth perception. Pt is unable to tell angles of concrete and holes in the ground. Pt has service dogs that are retired, one for guidance and one for helping with picking up and retrieving items. Pt had most recent stroke in January 2023 that further limited her field of vision.  Patient wants to work diligently on her strength, balance, mobility, and in order to improve her ability to ambulate on various surfaces in various scenarios.  Patient wanted to come to outpatient services to utilize equipment to improve her strength and a greater capacity than home health was able to assist her with.  She is doing home exercise program involving mostly standing with upper extremity support and activities to improve her strength.  Patient reports she is very nervous to ambulate without contact-guard assist and gait belt without being able to hold onto a wall or steady surface in order to provide her with tactile cues for the areas around her.  PAIN:  Are you having pain? None  PRECAUTIONS: Fall, hemianopsia  WEIGHT BEARING RESTRICTIONS: No  FALLS: Has patient fallen in last 6 months? No  PATIENT GOALS: Improve gait, improve strength, walk on unsteady surfaces  OBJECTIVE:   TODAY'S TREATMENT:                                                                                                                              DATE: 06/15/23  TA  Step taps 2 x 10 ea LE  Ascending and descending steps x 4 steps, cues to keep L foot position from adducted stance on descent. Step to pattern only   STS x 10 reps, no UE, cues for eccentric control with use of hips and trunk focus for control.   Outdoor ambulation on sidewalks and on inclines for practice with functional ambulation  2 rounds with cues for no  UE assit on STS transitions to and from [facility]  - Patient tends to drift to the right with hemiwalker patient was challenged and instructed to veer to the left that physical therapist would based on the her distance from edge of sidewalk.  PT transports patient back to waiting room to end session  Patient requires intermittent rest breaks due to fatigue particularly fatigue felt  then her right gluteal region  PATIENT EDUCATION: Education details:Pt educated throughout session about proper posture and technique with exercises. Improved exercise technique, movement at target joints, use of target muscles after min to mod verbal, visual, tactile cues.  Person educated: Patient Education method: Explanation Education comprehension: verbalized understanding  HOME EXERCISE PROGRAM:  Access Code: [email or web address] URL: [email or web address] Date: 11/24/2022 Prepared by: Marlynn Singer  Exercises - Alternating Step Taps with Counter Support  - 7 x weekly - 2 sets - 10 reps - Sit to Stand  - 1 x daily - 7 x weekly - 2 sets - 10 reps - Standing Tandem Balance with Counter Support  - 1 x daily - 3 x weekly - 2 sets - 2 reps - 10 hold - Standing Single Leg Stance with Counter Support  - 1 x daily - 3 x weekly - 2 sets - 2 reps - 5 hold - Seated Long Arc Quad with Ankle Weight  - 1 x daily - 7 x weekly - 2 sets - 10 reps  Previous: Pt performs leg lifts and squats with UE support    GOALS: Goals reviewed with patient? Yes  SHORT TERM GOALS: Target date: 06/29/2023      Patient will be independent in home exercise program to improve strength/mobility for better functional independence with ADLs. Baseline: No HEP currently 9/12: pt completing HEP regularly.  Goal status: MET  LONG TERM GOALS: Target date: 08/24/2023        1.  Patient (> 56 years old) will complete five times sit to stand test in < 15 seconds indicating an increased LE strength and improved balance. Baseline:   73.7 sec, Min A to prevent post LOB Goal status: NEW  2.  Pt will improve Stroke Impact Scale 16 by 8 points or greater to show progress with functional activities as it relates to her CVA.  Baseline: 45 Goal status: INITIAL   3.  Patient will increase 10 meter walk test to >.109m/s with hemiwalker as to improve gait speed for better community ambulation and to reduce fall risk. Baseline: .201 m/s with hemiwalker Goal status: REVISED  4.  Pt will improve TUG score with hemiwalker to 50 sec or less in order to indicate improved balance with functional tasks  Baseline: 71 sec Goal status: INITIAL   5.  Pt will ascend and descend 4 stairs with unilateral UE assist and without any sign of imbalance to improve her community mobility.  Baseline: Patient descends with step to gait pattern utilizing right upper extremity support, with descending stairs patient has significant difficulty showing cautionary behavior and also has very narrow base of support particularly when stepping down with the left lower extremity. Goal status: NEW  6.  Pt will step on and off of curb or simulated curb using LRAD  in order to allow for improved community mobility.  Baseline: Patient able to a send and descend 1 step with questions during activity of which lower extremity use as well as only able to complete with support bar. Goal status: IN PROGRESS    ASSESSMENT:  CLINICAL IMPRESSION:    Patient presents with good motivation for completion of physical therapy activities.  Continues to work with functional training for her stairs and showed improvement with confidence and form with stair navigation this date.  Patient challenged with outdoor ambulation challenged with turning on slight inclines as well as ambulating on sidewalks while staying within can confines of sidewalk.  Patient has difficulty with drift  to the right due to her visual field deficits worked on improving her ability to stay in the middle of  the sidewalk for improved safety with any kind of ambulation around her house or in the community. Pt will continue to benefit from skilled physical therapy intervention to address impairments, improve QOL, and attain therapy goals.    Is important to note patient still has continued difficulty with her AFO.  AFO is causing some significant pressure and discomfort on the lateral aspect of her left lower extremity and with her history this could lead to pressure injury.  Patient also has difficulty donning the brace secondary to her left upper extremity spasticity and hemiparesis.  This makes this brace less appropriate for independent donning for ambulation within the home requiring a reliance on caregivers to always present in the home for donning and doffing.  A more appropriate brace would likely be a double upright AFO with a T-strap.  Patient was informed of pros and cons of this kind of brace but it would minimize the pressure and risk of pressure injuries or sores on her lower extremities.  OBJECTIVE IMPAIRMENTS: Abnormal gait, decreased activity tolerance, decreased balance, decreased endurance, decreased strength, decreased safety awareness, hypomobility, impaired flexibility, and impaired UE functional use.   ACTIVITY LIMITATIONS: lifting, bending, standing, stairs, transfers, bed mobility, and locomotion level  PARTICIPATION LIMITATIONS: meal prep, cleaning, shopping, and community activity  PERSONAL FACTORS: Time since onset of injury/illness/exacerbation and 3+ comorbidities: diabetes, HTN, Multiple previous strokes   are also affecting patient's functional outcome.   REHAB POTENTIAL: Fair secondary to time since onset and severely of visual deficits and L UE funcitonal use   CLINICAL DECISION MAKING: Stable/uncomplicated  EVALUATION COMPLEXITY: Low  PLAN:  PT FREQUENCY: 1-2x/week   PT DURATION: 8 weeks   PLANNED INTERVENTIONS: 97164- PT Re-evaluation, 97110-Therapeutic  exercises, 97530- Therapeutic activity, 97112- Neuromuscular re-education, 97535- Self Care, 16109- Manual therapy, [phone]- Gait training, Patient/Family education, Balance training, Stair training, Joint mobilization, and Moist heat  PLAN FOR NEXT SESSION:  Ambulation outside when possible  Stair practice ( step to pattern with descent due to AFO limitations)   2:56 PM, 06/15/23 Edwina Gram PT ,DPT Physical Therapist- [place]  [facility]

## 2023-06-16 DIAGNOSIS — E7800 Pure hypercholesterolemia, unspecified: Secondary | ICD-10-CM | POA: Diagnosis not present

## 2023-06-16 DIAGNOSIS — Z794 Long term (current) use of insulin: Secondary | ICD-10-CM | POA: Diagnosis not present

## 2023-06-16 DIAGNOSIS — Z862 Personal history of diseases of the blood and blood-forming organs and certain disorders involving the immune mechanism: Secondary | ICD-10-CM | POA: Diagnosis not present

## 2023-06-16 DIAGNOSIS — K5010 Crohn's disease of large intestine without complications: Secondary | ICD-10-CM | POA: Diagnosis not present

## 2023-06-16 DIAGNOSIS — I69051 Hemiplegia and hemiparesis following nontraumatic subarachnoid hemorrhage affecting right dominant side: Secondary | ICD-10-CM | POA: Diagnosis not present

## 2023-06-16 DIAGNOSIS — E119 Type 2 diabetes mellitus without complications: Secondary | ICD-10-CM | POA: Diagnosis not present

## 2023-06-16 DIAGNOSIS — D649 Anemia, unspecified: Secondary | ICD-10-CM | POA: Diagnosis not present

## 2023-06-16 DIAGNOSIS — J431 Panlobular emphysema: Secondary | ICD-10-CM | POA: Diagnosis not present

## 2023-06-16 DIAGNOSIS — E039 Hypothyroidism, unspecified: Secondary | ICD-10-CM | POA: Diagnosis not present

## 2023-06-17 ENCOUNTER — Ambulatory Visit: Payer: PPO | Admitting: Physical Therapy

## 2023-06-17 DIAGNOSIS — R262 Difficulty in walking, not elsewhere classified: Secondary | ICD-10-CM | POA: Diagnosis not present

## 2023-06-17 DIAGNOSIS — R2689 Other abnormalities of gait and mobility: Secondary | ICD-10-CM

## 2023-06-17 DIAGNOSIS — R2681 Unsteadiness on feet: Secondary | ICD-10-CM

## 2023-06-17 DIAGNOSIS — R269 Unspecified abnormalities of gait and mobility: Secondary | ICD-10-CM

## 2023-06-17 NOTE — Therapy (Signed)
 OUTPATIENT PHYSICAL THERAPY TREATMENT   Patient Name: [name] MRN: [id number] DOB:Jul 15, 1950, 73 y.o., female Today's Date: 06/17/2023   PCP:  Rex Castor, MD   REFERRING PROVIDER: Bufford Carne, MD   END OF SESSION:  PT End of Session - 06/17/23 1444     Visit Number 5    Number of Visits 24    Date for PT Re-Evaluation 08/24/23    Authorization Type [employer]    Progress Note Due on Visit 10    PT Start Time 1440    PT Stop Time 1520    PT Time Calculation (min) 40 min    Equipment Utilized During Treatment Gait belt    Activity Tolerance Patient tolerated treatment well;No increased pain    Behavior During Therapy [facility] for tasks assessed/performed              Past Medical History:  Diagnosis Date   Abdominal pain 12/06/2013   Abnormality of gait 01/26/2013   Altered bowel habits 12/11/2017   Bruises easily    Constipation    Crohn's disease (HCC)    Depression    Dizziness and giddiness 01/26/2013   Fecal incontinence alternating with constipation 12/11/2017   GERD (gastroesophageal reflux disease)    Hemianopia    left   Hemiparesis and alteration of sensations as late effects of stroke (HCC) 01/26/2013   Hyperlipidemia    Hypertension    Hypothyroidism    Lump in female breast    Panic disorder    PONV (postoperative nausea and vomiting)    Stroke (HCC) 2009   Tremor, essential 04/24/2020   Type II diabetes mellitus (HCC)    Past Surgical History:  Procedure Laterality Date   ANAL FISSURE REPAIR  2008   APPENDECTOMY  1986   BREAST BIOPSY Left 2006   neg   BREAST LUMPECTOMY Right 1990's   CESAREAN SECTION  1981; 1983   CHOLECYSTECTOMY  1986   SMALL INTESTINE SURGERY  05/1991   TUBAL LIGATION  1983   Patient Active Problem List   Diagnosis Date Noted   Bone spur 06/13/2021   Acute CVA (cerebrovascular accident) (HCC) 03/07/2021   CVA (cerebral vascular accident) (HCC) 11/01/2020   Chronic pain 04/25/2020    Chronic constipation 04/25/2020   Gastro-esophageal reflux disease without esophagitis 04/25/2020   Hardening of the aorta (main artery of the heart) (HCC) 04/25/2020   History of iron deficiency anemia 04/25/2020   Hypercholesteremia 04/25/2020   Hypertension 04/25/2020   Personal history of transient ischemic attack (TIA), and cerebral infarction without residual deficits 04/25/2020   Pulmonary emphysema (HCC) 04/25/2020   Vitamin D deficiency 04/25/2020   Tremor, essential 04/24/2020   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   Right lower quadrant abdominal pain 08/18/2018   Spastic hemiplegia affecting nondominant side (HCC) 02/03/2014   Diabetes mellitus (HCC) 09/07/2013   Hypothyroidism 09/07/2013   Type 2 diabetes mellitus without complications (HCC) 09/07/2013   Hemiparesis and alteration of sensations as late effects of stroke (HCC) 01/26/2013   Unsteady gait 01/26/2013   Dizziness and giddiness 01/26/2013    ONSET DATE: 2009  REFERRING DIAG: Z86.73 (ICD-10-CM) - History of stroke   THERAPY DIAG:  Difficulty in walking, not elsewhere classified  Other abnormalities of gait and mobility  Unsteadiness on feet  Abnormality of gait and mobility  Rationale for Evaluation and Treatment: Rehabilitation  SUBJECTIVE:  SUBJECTIVE STATEMENT:    Pt reports che had RBC checked and it was 6. Report she started iron pill.   Pt accompanied by: self  PERTINENT HISTORY: Pt has had one major stroke and 5 mini strokes since 2009. Pt is here since her doctor wants her to work on her strength and balance and she also wants to improve in these categories. Pt has hemianopsia with blindness inferior, left side and superior. Pt initially had stroke in 2009 and was previously able to walk for long  distances. Pt has 650 feet driveway that is gravel and used to walk up and down with the cane. Pt has issues with depth perception. Pt is unable to tell angles of concrete and holes in the ground. Pt has service dogs that are retired, one for guidance and one for helping with picking up and retrieving items. Pt had most recent stroke in January 2023 that further limited her field of vision.  Patient wants to work diligently on her strength, balance, mobility, and in order to improve her ability to ambulate on various surfaces in various scenarios.  Patient wanted to come to outpatient services to utilize equipment to improve her strength and a greater capacity than home health was able to assist her with.  She is doing home exercise program involving mostly standing with upper extremity support and activities to improve her strength.  Patient reports she is very nervous to ambulate without contact-guard assist and gait belt without being able to hold onto a wall or steady surface in order to provide her with tactile cues for the areas around her.  PAIN:  Are you having pain? None  PRECAUTIONS: Fall, hemianopsia  WEIGHT BEARING RESTRICTIONS: No  FALLS: Has patient fallen in last 6 months? No  PATIENT GOALS: Improve gait, improve strength, walk on unsteady surfaces  OBJECTIVE:   TODAY'S TREATMENT:                                                                                                                              DATE: 06/17/23  TA: transfer sit to stand and transfer from [facility] to Standard arm chair   NMR:  Seated on dyna disc  - x 2 min static  - 2 x 10 seated march  - x 10 trunk rotation   TE:   LAQ 2 x 10 with 3# AW  Seated R heel raise 2 x 15 reps  Seated hip adduction 2 x 13  Seated hip abduction 2 x 10 with GTB, min movement of L LE   Self care: informed pt of need to speak with MD prior to next PT session due to importance of this metric for safe exercise.   Pt required  frequent rest breaks due to low hemoglobin read in chart.   PATIENT EDUCATION: Education details:Pt educated throughout session about proper posture and technique with exercises. Improved exercise technique, movement at target joints, use of target muscles after min  to mod verbal, visual, tactile cues.  Person educated: Patient Education method: Explanation Education comprehension: verbalized understanding  HOME EXERCISE PROGRAM:  Access Code: [email or web address] URL: [email or web address] Date: 11/24/2022 Prepared by: Marlynn Singer  Exercises - Alternating Step Taps with Counter Support  - 7 x weekly - 2 sets - 10 reps - Sit to Stand  - 1 x daily - 7 x weekly - 2 sets - 10 reps - Standing Tandem Balance with Counter Support  - 1 x daily - 3 x weekly - 2 sets - 2 reps - 10 hold - Standing Single Leg Stance with Counter Support  - 1 x daily - 3 x weekly - 2 sets - 2 reps - 5 hold - Seated Long Arc Quad with Ankle Weight  - 1 x daily - 7 x weekly - 2 sets - 10 reps  Previous: Pt performs leg lifts and squats with UE support    GOALS: Goals reviewed with patient? Yes  SHORT TERM GOALS: Target date: 06/29/2023      Patient will be independent in home exercise program to improve strength/mobility for better functional independence with ADLs. Baseline: No HEP currently 9/12: pt completing HEP regularly.  Goal status: MET  LONG TERM GOALS: Target date: 08/24/2023        1.  Patient (> 33 years old) will complete five times sit to stand test in < 15 seconds indicating an increased LE strength and improved balance. Baseline:  73.7 sec, Min A to prevent post LOB Goal status: NEW  2.  Pt will improve Stroke Impact Scale 16 by 8 points or greater to show progress with functional activities as it relates to her CVA.  Baseline: 45 Goal status: INITIAL   3.  Patient will increase 10 meter walk test to >.29m/s with hemiwalker as to improve gait speed for better community  ambulation and to reduce fall risk. Baseline: .201 m/s with hemiwalker Goal status: REVISED  4.  Pt will improve TUG score with hemiwalker to 50 sec or less in order to indicate improved balance with functional tasks  Baseline: 71 sec Goal status: INITIAL   5.  Pt will ascend and descend 4 stairs with unilateral UE assist and without any sign of imbalance to improve her community mobility.  Baseline: Patient descends with step to gait pattern utilizing right upper extremity support, with descending stairs patient has significant difficulty showing cautionary behavior and also has very narrow base of support particularly when stepping down with the left lower extremity. Goal status: NEW  6.  Pt will step on and off of curb or simulated curb using LRAD  in order to allow for improved community mobility.  Baseline: Patient able to a send and descend 1 step with questions during activity of which lower extremity use as well as only able to complete with support bar. Goal status: IN PROGRESS    ASSESSMENT:  CLINICAL IMPRESSION:    Pt presents with reports of decreased red blood cell count.  Upon further investigation to chart it turned out this was decreased and hemoglobin and hemoglobin was 6.5 when tested yesterday.  Physical therapist only perform seated light activities with prolonged rest break in between in order to prevent excessive strain on system.  Patient instructed to consult with physician prior to next appointment to see if physical therapy is safe to continue with higher level interventions at this time.Pt will continue to benefit from skilled physical therapy intervention to address impairments, improve QOL, and attain therapy  goals.     Is important to note patient still has continued difficulty with her AFO.  AFO is causing some significant pressure and discomfort on the lateral aspect of her left lower extremity and with her history this could lead to pressure injury.   Patient also has difficulty donning the brace secondary to her left upper extremity spasticity and hemiparesis.  This makes this brace less appropriate for independent donning for ambulation within the home requiring a reliance on caregivers to always present in the home for donning and doffing.  A more appropriate brace would likely be a double upright AFO with a T-strap.  Patient was informed of pros and cons of this kind of brace but it would minimize the pressure and risk of pressure injuries or sores on her lower extremities.  OBJECTIVE IMPAIRMENTS: Abnormal gait, decreased activity tolerance, decreased balance, decreased endurance, decreased strength, decreased safety awareness, hypomobility, impaired flexibility, and impaired UE functional use.   ACTIVITY LIMITATIONS: lifting, bending, standing, stairs, transfers, bed mobility, and locomotion level  PARTICIPATION LIMITATIONS: meal prep, cleaning, shopping, and community activity  PERSONAL FACTORS: Time since onset of injury/illness/exacerbation and 3+ comorbidities: diabetes, HTN, Multiple previous strokes   are also affecting patient's functional outcome.   REHAB POTENTIAL: Fair secondary to time since onset and severely of visual deficits and L UE funcitonal use   CLINICAL DECISION MAKING: Stable/uncomplicated  EVALUATION COMPLEXITY: Low  PLAN:  PT FREQUENCY: 1-2x/week   PT DURATION: 8 weeks   PLANNED INTERVENTIONS: 97164- PT Re-evaluation, 97110-Therapeutic exercises, 97530- Therapeutic activity, 97112- Neuromuscular re-education, 97535- Self Care, 16109- Manual therapy, [phone]- Gait training, Patient/Family education, Balance training, Stair training, Joint mobilization, and Moist heat  PLAN FOR NEXT SESSION:  Ambulation outside when possible  Stair practice ( step to pattern with descent due to AFO limitations)   2:45 PM, 06/17/23 Edwina Gram PT ,DPT Physical Therapist- [place]  [facility]

## 2023-06-22 ENCOUNTER — Ambulatory Visit: Payer: PPO | Admitting: Physical Therapy

## 2023-06-22 ENCOUNTER — Encounter: Payer: Self-pay | Admitting: Podiatry

## 2023-06-22 ENCOUNTER — Ambulatory Visit: Admitting: Podiatry

## 2023-06-22 VITALS — Ht 64.0 in | Wt 148.0 lb

## 2023-06-22 DIAGNOSIS — E1142 Type 2 diabetes mellitus with diabetic polyneuropathy: Secondary | ICD-10-CM | POA: Diagnosis not present

## 2023-06-22 DIAGNOSIS — B351 Tinea unguium: Secondary | ICD-10-CM | POA: Diagnosis not present

## 2023-06-22 DIAGNOSIS — M79675 Pain in left toe(s): Secondary | ICD-10-CM

## 2023-06-22 DIAGNOSIS — M79674 Pain in right toe(s): Secondary | ICD-10-CM | POA: Diagnosis not present

## 2023-06-22 DIAGNOSIS — Z794 Long term (current) use of insulin: Secondary | ICD-10-CM

## 2023-06-24 ENCOUNTER — Ambulatory Visit: Payer: PPO | Admitting: Physical Therapy

## 2023-06-24 ENCOUNTER — Inpatient Hospital Stay: Attending: Oncology | Admitting: Oncology

## 2023-06-24 ENCOUNTER — Encounter: Payer: Self-pay | Admitting: Oncology

## 2023-06-24 ENCOUNTER — Telehealth: Payer: Self-pay

## 2023-06-24 ENCOUNTER — Inpatient Hospital Stay

## 2023-06-24 ENCOUNTER — Other Ambulatory Visit: Payer: Self-pay

## 2023-06-24 VITALS — BP 138/68 | HR 99 | Temp 98.1°F | Resp 18

## 2023-06-24 DIAGNOSIS — D509 Iron deficiency anemia, unspecified: Secondary | ICD-10-CM

## 2023-06-24 DIAGNOSIS — D649 Anemia, unspecified: Secondary | ICD-10-CM

## 2023-06-24 DIAGNOSIS — K50919 Crohn's disease, unspecified, with unspecified complications: Secondary | ICD-10-CM

## 2023-06-24 DIAGNOSIS — K5090 Crohn's disease, unspecified, without complications: Secondary | ICD-10-CM | POA: Insufficient documentation

## 2023-06-24 LAB — CBC WITH DIFFERENTIAL/PLATELET
Abs Immature Granulocytes: 0.04 10*3/uL (ref 0.00–0.07)
Basophils Absolute: 0.1 10*3/uL (ref 0.0–0.1)
Basophils Relative: 1 %
Eosinophils Absolute: 0.2 10*3/uL (ref 0.0–0.5)
Eosinophils Relative: 2 %
HCT: 24.7 % — ABNORMAL LOW (ref 36.0–46.0)
Hemoglobin: 6.6 g/dL — CL (ref 12.0–15.0)
Immature Granulocytes: 1 %
Lymphocytes Relative: 26 %
Lymphs Abs: 2.1 10*3/uL (ref 0.7–4.0)
MCH: 17.1 pg — ABNORMAL LOW (ref 26.0–34.0)
MCHC: 26.7 g/dL — ABNORMAL LOW (ref 30.0–36.0)
MCV: 63.8 fL — ABNORMAL LOW (ref 80.0–100.0)
Monocytes Absolute: 0.6 10*3/uL (ref 0.1–1.0)
Monocytes Relative: 8 %
Neutro Abs: 5.1 10*3/uL (ref 1.7–7.7)
Neutrophils Relative %: 62 %
Platelets: 270 10*3/uL (ref 150–400)
RBC: 3.87 MIL/uL (ref 3.87–5.11)
RDW: 20.9 % — ABNORMAL HIGH (ref 11.5–15.5)
WBC: 8 10*3/uL (ref 4.0–10.5)
nRBC: 0 % (ref 0.0–0.2)

## 2023-06-24 LAB — RETIC PANEL
Immature Retic Fract: 25.7 % — ABNORMAL HIGH (ref 2.3–15.9)
RBC.: 3.89 MIL/uL (ref 3.87–5.11)
Retic Count, Absolute: 99.2 10*3/uL (ref 19.0–186.0)
Retic Ct Pct: 2.6 % (ref 0.4–3.1)
Reticulocyte Hemoglobin: 17.5 pg — ABNORMAL LOW (ref >27.9)

## 2023-06-24 LAB — ABO/RH: ABO/RH(D): O NEG

## 2023-06-24 LAB — PREPARE RBC (CROSSMATCH)

## 2023-06-24 NOTE — Progress Notes (Signed)
 Received critical hemoglobin of 6.6 from [facility]. MD notified. Pt has been scheduled for 1 unit of blood.

## 2023-06-24 NOTE — Progress Notes (Signed)
 Hematology/Oncology Consult note [phone] [phone]        REFERRING PROVIDER: Rex Castor, MD   CHIEF COMPLAINTS/REASON FOR VISIT:  Evaluation of    ASSESSMENT & PLAN:   Iron deficiency anemia Previous labs reviewed and discussed with patient. Consistent with severe iron deficiency anemia. Lab Results  Component Value Date   HGB 6.6 (LL) 06/24/2023     I discussed IV Venofer treatments. I discussed about the potential risks including but not limited to allergic reactions/infusion reactions including anaphylactic reactions, diarrhea, phlebitis, high blood pressure, wheezing, SOB, skin rash, weight gain,dark urine, leg swelling, back pain, headache, nausea and fatigue, etc. patient is undecided and would like to see how her blood work result is today.  Recommend IV venofer weekly x 4   Today's CBC showed hemoglobin of 6.6.  Recommend 1 unit of PRBC transfusion. Etiology of iron deficiency anemia, consider malabsorption versus GI blood loss.  Patient has history of Crohn's disease, small bowel resection.  Recommend patient to reestablish care with GI Dr. Baldomero Bone for further evaluation.  I offered to send a message to Dr. Baldomero Bone and the patient declined.  She will call Dr. Ludger [place] office to request for a follow-up appointment.  Crohn disease (HCC) Recommend patient follow-up with GI.   Orders Placed This Encounter  Procedures   CBC with Differential/Platelet    Standing Status:   Future    Number of Occurrences:   1    Expected Date:   06/24/2023    Expiration Date:   06/23/2024   Retic Panel    Standing Status:   Future    Number of Occurrences:   1    Expected Date:   06/24/2023    Expiration Date:   06/23/2024   ABO/Rh    Standing Status:   Future    Number of Occurrences:   1    Expected Date:   06/24/2023    Expiration Date:   06/23/2024   Follow-up to be determined. All questions were answered. The patient knows to call the clinic with  any problems, questions or concerns.  Timmy Forbes, MD, PhD [facility] Hematology Oncology 06/24/2023   HISTORY OF PRESENTING ILLNESS:   [name] is a  73 y.o.  female with PMH listed below was seen in consultation at the request of  Rex Castor, MD  for evaluation of iron deficiency anemia.  06/16/2023, patient was seen by primary care provider.  She has experienced some fatigue and feeling cold.  Workup showed a hemoglobin of 6.5, iron saturation 2%, ferritin 3. Patient was recommended to start Feosol once daily for about a week..  She also takes multivitamin containing 8 mg of iron, though she has not been consistent with the multivitamin. She has noticed some improvement in her symptoms, particularly no longer feeling cold.  Patient reports that has been consuming less red meat and more chicken and [place]. She also consumes steel-cut oatmeal regularly. . To increase her iron intake, she has started eating Raisin Bran, which contains iron.  She reports constipation, which she attributes to the Feosol, but denies any blood in her stool. She has a history of intermittent constipation and diarrhea, which she manages through dietary changes. No recent changes in bowel habits prior to starting Feosol.  Her past medical history includes Crohn's disease, leading to a small bowel resection approximately 35 years ago, gallbladder removal. . She has a history of stroke and is on Plavix  and aspirin  for stroke prevention. She  experiences occasional shortness of breath, particularly with exertion, and occasional lightheadedness, which she attributes to her stroke history.  She has not had a recent colonoscopy, with the last one performed approximately 2021.  Patient was previously seen by Dr. Baldomero Bone.   MEDICAL HISTORY:  Past Medical History:  Diagnosis Date   Abdominal pain 12/06/2013   Abnormality of gait 01/26/2013   Altered bowel habits 12/11/2017   Bruises easily    Constipation    Crohn's  disease (HCC)    Depression    Dizziness and giddiness 01/26/2013   Fecal incontinence alternating with constipation 12/11/2017   GERD (gastroesophageal reflux disease)    Hemianopia    left   Hemiparesis and alteration of sensations as late effects of stroke (HCC) 01/26/2013   Hyperlipidemia    Hypertension    Hypothyroidism    Lump in female breast    Panic disorder    PONV (postoperative nausea and vomiting)    Stroke (HCC) 2009   Tremor, essential 04/24/2020   Type II diabetes mellitus (HCC)     SURGICAL HISTORY: Past Surgical History:  Procedure Laterality Date   ANAL FISSURE REPAIR  2008   APPENDECTOMY  1986   BREAST BIOPSY Left 2006   neg   BREAST LUMPECTOMY Right 1990's   CESAREAN SECTION  1981; 1983   CHOLECYSTECTOMY  1986   SMALL INTESTINE SURGERY  05/1991   TUBAL LIGATION  1983    SOCIAL HISTORY: Social History   Socioeconomic History   Marital status: Married    Spouse name: [name]   Number of children: 2   Years of education: doctorate   Highest education level: Not on file  Occupational History   Occupation: Copy  Tobacco Use   Smoking status: Former    Current packs/day: 0.00    Average packs/day: 1 pack/day for 20.0 years (20.0 ttl pk-yrs)    Types: Cigarettes    Start date: 04/16/1973    Quit date: 04/16/1993    Years since quitting: 30.2   Smokeless tobacco: Never  Substance and Sexual Activity   Alcohol use: Not Currently    Comment: "haven't drank since early 1980's"   Drug use: No   Sexual activity: Yes  Other Topics Concern   Not on file  Social History Narrative   Patient is married Hinton Luis), has 2 children   Patient is right handed   Caffeine consumption is rarely, drinks decaf   Social Drivers of Corporate investment banker Strain: Low Risk  (02/20/2023)   Received from [facility]   Overall Financial Resource Strain (CARDIA)    Difficulty of Paying Living Expenses: Not hard at all  Food Insecurity: No  Food Insecurity (02/20/2023)   Received from [facility]   Hunger Vital Sign    Worried About Running Out of Food in the Last Year: Never true    Ran Out of Food in the Last Year: Never true  Transportation Needs: No Transportation Needs (02/20/2023)   Received from [facility]   PRAPARE - Transportation    Lack of Transportation (Non-Medical): No    In the past 12 months, has lack of transportation kept you from medical appointments or from getting medications?: No  Physical Activity: Not on file  Stress: Not on file  Social Connections: Not on file  Intimate Partner Violence: Not on file    FAMILY HISTORY: Family History  Problem Relation Age of Onset   Heart disease Mother  Heart disease Father    Diabetes Brother    Diabetes Brother    Migraines Son     ALLERGIES:  is allergic to codeine, morphine  and codeine, baclofen, penicillins, sulfamethoxazole, elemental sulfur, and metformin  hcl.  MEDICATIONS:  Current Outpatient Medications  Medication Sig Dispense Refill   aspirin  325 MG tablet Take 1 tablet (325 mg total) by mouth daily. 30 tablet 2   clopidogrel  (PLAVIX ) 75 MG tablet Take 1 tablet (75 mg total) by mouth daily. (Patient taking differently: Take 75 mg by mouth every evening.) 30 tablet 0   clopidogrel  (PLAVIX ) 75 MG tablet Take 1 tablet (75 mg total) by mouth daily. 90 tablet 1   Continuous Glucose Sensor (GUARDIAN SENSOR 3) MISC Use 1 every 10 days. 3 each 11   Dulaglutide  (TRULICITY ) 1.5 MG/0.5ML SOAJ Inject 1.5 mg into the skin once a week. 2 mL 11   ferrous sulfate 325 (65 FE) MG EC tablet Take 325 mg by mouth 3 (three) times daily with meals.     fluticasone  (FLONASE ) 50 MCG/ACT nasal spray Place 2 sprays into both nostrils daily. 16 g 5   gabapentin  (NEURONTIN ) 300 MG capsule Take 2 capsules (600 mg total) by mouth 3 (three) times daily. 180 capsule 1   glucose blood test strip Use to test blood sugar  3 (three) times  daily. 100 each 11   insulin  degludec (TRESIBA  FLEXTOUCH) 100 UNIT/ML FlexTouch Pen Inject 30 Units into the skin in the morning.     insulin  degludec (TRESIBA  FLEXTOUCH) 100 UNIT/ML FlexTouch Pen Inject 30 Units into the skin daily. 15 mL 11   ketoconazole  (NIZORAL ) 2 % cream Apply 1 Application topically to groin and under arms 1 (one) to 2 (two) times daily as needed for rash. 60 g 6   levothyroxine  (SYNTHROID ) 125 MCG tablet Take 1 tablet (125 mcg total) by mouth daily Monday - Saturday. 90 tablet 3   metFORMIN  (GLUCOPHAGE ) 500 MG tablet Take 1 tablet (500 mg total) by mouth 2 (two) times daily with meals 180 tablet 3   metroNIDAZOLE  (METROCREAM ) 0.75 % cream Apply qd/bid to face for rosacea 45 g 11   metroNIDAZOLE  (METROCREAM ) 0.75 % cream Apply 1 Application topically to face 1 (one) to 2 (two) times daily for rosacea. 45 g 10   Multiple Vitamin (MULTIVITAMIN) tablet Take 1 tablet by mouth daily.     pantoprazole  (PROTONIX ) 40 MG tablet Take 40 mg by mouth in the morning.     pantoprazole  (PROTONIX ) 40 MG tablet Take 1 tablet (40 mg total) by mouth in the morning. 90 tablet 1   ramipril  (ALTACE ) 2.5 MG capsule Take 2.5 mg by mouth every evening.     ramipril  (ALTACE ) 2.5 MG capsule Take 1 capsule (2.5 mg total) by mouth daily. 90 capsule 1   rosuvastatin  (CRESTOR ) 20 MG tablet Take 1 tablet (20 mg total) by mouth daily. 90 tablet 3   No current facility-administered medications for this visit.    Review of Systems  Constitutional:  Positive for fatigue. Negative for appetite change, chills and fever.  HENT:   Negative for hearing loss and voice change.   Eyes:  Negative for eye problems.  Respiratory:  Positive for shortness of breath. Negative for chest tightness and cough.   Cardiovascular:  Negative for chest pain.  Gastrointestinal:  Positive for constipation. Negative for abdominal distention, abdominal pain, blood in stool, nausea and vomiting.  Endocrine: Negative for hot  flashes.  Genitourinary:  Negative for difficulty urinating  and frequency.   Musculoskeletal:  Negative for arthralgias.  Skin:  Negative for itching and rash.  Neurological:  Negative for extremity weakness.  Hematological:  Negative for adenopathy.  Psychiatric/Behavioral:  Negative for confusion.    PHYSICAL EXAMINATION:  Vitals:   06/24/23 1459  BP: 138/68  Pulse: 99  Resp: 18  Temp: 98.1 F (36.7 C)  SpO2: 98%   There were no vitals filed for this visit.  Physical Exam Constitutional:      General: She is not in acute distress.    Comments: Patient sits in the wheelchair  HENT:     Head: Normocephalic and atraumatic.  Eyes:     General: No scleral icterus. Cardiovascular:     Rate and Rhythm: Normal rate and regular rhythm.  Pulmonary:     Effort: Pulmonary effort is normal. No respiratory distress.     Breath sounds: No wheezing.  Abdominal:     General: Bowel sounds are normal. There is no distension.     Palpations: Abdomen is soft.  Musculoskeletal:     Cervical back: Normal range of motion and neck supple.  Skin:    General: Skin is warm and dry.     Coloration: Skin is pale.     Findings: No erythema or rash.  Neurological:     Mental Status: She is alert and oriented to person, place, and time. Mental status is at baseline.     Comments: Chronic left-sided weakness due to history of stroke.  Psychiatric:        Mood and Affect: Mood normal.     LABORATORY DATA:  I have reviewed the data as listed    Latest Ref Rng & Units 06/24/2023    3:53 PM 03/08/2021    9:38 AM 03/07/2021    3:36 PM  CBC  WBC 4.0 - 10.5 K/uL 8.0  6.2  7.1   Hemoglobin 12.0 - 15.0 g/dL 6.6  96.0  45.4   Hematocrit 36.0 - 46.0 % 24.7  36.3  37.7   Platelets 150 - 400 K/uL 270  234  238       Latest Ref Rng & Units 03/08/2021    9:38 AM 03/07/2021    3:36 PM 11/01/2020    6:42 PM  CMP  Glucose 70 - 99 mg/dL 098  119    BUN 8 - 23 mg/dL 11  10    Creatinine 1.47 - 1.00  mg/dL 8.29  5.62  1.30   Sodium 135 - 145 mmol/L 139  136    Potassium 3.5 - 5.1 mmol/L 3.7  3.7    Chloride 98 - 111 mmol/L 107  102    CO2 22 - 32 mmol/L 24  27    Calcium  8.9 - 10.3 mg/dL 9.1  9.4    Total Protein 6.5 - 8.1 g/dL  7.5    Total Bilirubin 0.3 - 1.2 mg/dL  0.4    Alkaline Phos 38 - 126 U/L  54    AST 15 - 41 U/L  20    ALT 0 - 44 U/L  15        RADIOGRAPHIC STUDIES: I have personally reviewed the radiological images as listed and agreed with the findings in the report. No results found.

## 2023-06-24 NOTE — Assessment & Plan Note (Signed)
 Recommend patient follow-up with GI.

## 2023-06-24 NOTE — Telephone Encounter (Signed)
 Called pt to notify her of hemoglobin result and the need for blood transfusion. Pt and husband did not answer. Detailed message left letting her know of plan. She has been scheduled for tomorrow afternoon and will be contacted in the morning to let her know about appt details.

## 2023-06-24 NOTE — Assessment & Plan Note (Signed)
 Previous labs reviewed and discussed with patient. Consistent with severe iron deficiency anemia. Lab Results  Component Value Date   HGB 6.6 (LL) 06/24/2023     I discussed IV Venofer treatments. I discussed about the potential risks including but not limited to allergic reactions/infusion reactions including anaphylactic reactions, diarrhea, phlebitis, high blood pressure, wheezing, SOB, skin rash, weight gain,dark urine, leg swelling, back pain, headache, nausea and fatigue, etc. patient is undecided and would like to see how her blood work result is today.  Recommend IV venofer weekly x 4   Today's CBC showed hemoglobin of 6.6.  Recommend 1 unit of PRBC transfusion. Etiology of iron deficiency anemia, consider malabsorption versus GI blood loss.  Patient has history of Crohn's disease, small bowel resection.  Recommend patient to reestablish care with GI Dr. Baldomero Bone for further evaluation.  I offered to send a message to Dr. Baldomero Bone and the patient declined.  She will call Dr. Ludger [place] office to request for a follow-up appointment.

## 2023-06-25 ENCOUNTER — Inpatient Hospital Stay: Attending: Oncology

## 2023-06-25 DIAGNOSIS — D509 Iron deficiency anemia, unspecified: Secondary | ICD-10-CM | POA: Diagnosis not present

## 2023-06-25 DIAGNOSIS — D649 Anemia, unspecified: Secondary | ICD-10-CM

## 2023-06-25 MED ORDER — SODIUM CHLORIDE 0.9% IV SOLUTION
250.0000 mL | INTRAVENOUS | Status: DC
  Administered 2023-06-25: 250 mL via INTRAVENOUS
  Filled 2023-06-25: qty 250

## 2023-06-25 MED ORDER — ACETAMINOPHEN 325 MG PO TABS
650.0000 mg | ORAL_TABLET | Freq: Once | ORAL | Status: AC
  Administered 2023-06-25: 650 mg via ORAL
  Filled 2023-06-25: qty 2

## 2023-06-25 MED ORDER — DIPHENHYDRAMINE HCL 25 MG PO CAPS
25.0000 mg | ORAL_CAPSULE | Freq: Once | ORAL | Status: AC
  Administered 2023-06-25: 25 mg via ORAL
  Filled 2023-06-25: qty 1

## 2023-06-25 NOTE — Patient Instructions (Signed)

## 2023-06-25 NOTE — Telephone Encounter (Signed)
 Spoke to pt and informed her of Blood transfusion time for today, pt agreeable. Also informed her that Dr. Wilhelmenia Harada says that given that hemoglobin is still low despite taking oral iron, she recommends Venofer weekly x4. Pt says that she will think about it. I told her that if she decides to proceed she can schedule appts when she is here this afternoon or she can call another day to schedule. Pt verbalized understanding.   To be scheduled, if pt decides to proceed:   Venofer weekly x4 (first dose is NEW)

## 2023-06-26 ENCOUNTER — Telehealth: Payer: Self-pay | Admitting: *Deleted

## 2023-06-26 ENCOUNTER — Encounter: Payer: Self-pay | Admitting: Oncology

## 2023-06-26 ENCOUNTER — Other Ambulatory Visit: Payer: Self-pay

## 2023-06-26 DIAGNOSIS — D509 Iron deficiency anemia, unspecified: Secondary | ICD-10-CM

## 2023-06-26 LAB — TYPE AND SCREEN
ABO/RH(D): O NEG
Antibody Screen: NEGATIVE
Unit division: 0

## 2023-06-26 LAB — BPAM RBC
Blood Product Expiration Date: 202505252359
ISSUE DATE / TIME: 202505011336
Unit Type and Rh: 5100

## 2023-06-26 NOTE — Telephone Encounter (Signed)
 Per Dr. Wilhelmenia Harada:   Please arrange her to get IV venofer weekly x 4.  Follow up in 3 months lab prior to Md +/- venofer.  Cbc iron tibc ferritin retic panel.   Since pt has decided to proceed with IV venofer, please schedule 3 month follow up and inform pt:   3 months: labs piror to MD/ +/- Venofer

## 2023-06-26 NOTE — Telephone Encounter (Signed)
 Colonoscopy date added to pt's chart. Note faxed to Dr. Ludger [place] office for pt to re establish care. Advised pt to also call and set up follow up appt.

## 2023-06-26 NOTE — Telephone Encounter (Signed)
 Patient wants to have a call back so that she can straighten out the things that the husband did not say correctly.  Spoke to the patient and she says that the husband had said she has had a colonoscopy about 10 years ago but she kept looking and the last time she got a colonoscopy was at [facility] in November 2021 with Dr. Alycia Babcock.  She also wants to have a referral sent to Dr. Baldomero Bone and wanted to have someone to referral over to Dr. Baldomero Bone.

## 2023-06-28 NOTE — Progress Notes (Signed)
  Subjective:  Patient ID: [name], female    DOB: Nov 20, 1950,  MRN: [id number]  [name] presents to clinic today for at risk foot care with history of diabetic neuropathy and painful mycotic toenails of both feet that are difficult to trim. Pain interferes with daily activities and wearing enclosed shoe gear comfortably.  Chief Complaint  Patient presents with   Nail Problem    Pt is here for [facility] last A1C was 6.4 PCP is Dr Primus Brookes and LOV was last week.   New problem(s): None.   PCP is Rex Castor, MD.  Allergies  Allergen Reactions   Codeine Other (See Comments)    Syncope.   Morphine  And Codeine Nausea And Vomiting and Other (See Comments)    Hallucinations.   Baclofen Nausea Only and Other (See Comments)    dizziness   Penicillins Other (See Comments)    Family history   Sulfamethoxazole Itching    Hands itching    Elemental Sulfur Itching    Per patient happened years ago.   Metformin  Hcl Other (See Comments) and Diarrhea    diarrhea   Review of Systems: Negative except as noted in the HPI.  Objective: No changes noted in today's physical examination. There were no vitals filed for this visit. [name] is a pleasant 73 y.o. female WD, WN in NAD. AAO x 3.  Vascular Examination: Capillary refill time immediate b/l. Vascular status intact b/l with palpable pedal pulses. Pedal hair present b/l. No pain with calf compression b/l. Skin temperature gradient WNL b/l. No cyanosis or clubbing b/l. No ischemia or gangrene noted b/l. Trace edema noted BLE.  Neurological Examination: Protective sensation diminished with 10g monofilament b/l.  Dermatological Examination: Pedal skin with normal turgor, texture and tone b/l.  No open wounds. No interdigital macerations.   Toenails 1-5 b/l thick, discolored, elongated with subungual debris and pain on dorsal palpation.   No hyperkeratotic nor porokeratotic lesions present on today's visit.  Musculoskeletal  Examination: Dropfoot left lower extremity. Wearing AFO on left lower extremity. Pes planovalgus deformity noted b/l lower extremities. Utilizes wheelchair for mobility assistance.  Radiographs: None  Assessment/Plan: 1. Pain due to onychomycosis of toenails of both feet   2. Type 2 diabetes mellitus with diabetic polyneuropathy, with long-term current use of insulin  [facility])    Consent given for treatment. Patient examined. All patient's and/or POA's questions/concerns addressed on today's visit. Mycotic toenails 1-5 debrided in length and girth without incident. Continue foot and shoe inspections daily. Monitor blood glucose per PCP/Endocrinologist's recommendations.Continue soft, supportive shoe gear daily. Report any pedal injuries to medical professional. Call office if there are any quesitons/concerns. -Patient/POA to call should there be question/concern in the interim.   Return in about 3 months (around 09/21/2023).  [name], DPM      [place] LOCATION: [address] [place] [address]                   Office [phone]   [facility] LOCATION: [address] [place], [place] [address] Office [phone]

## 2023-06-29 ENCOUNTER — Inpatient Hospital Stay

## 2023-06-29 ENCOUNTER — Ambulatory Visit: Payer: PPO | Admitting: Physical Therapy

## 2023-06-29 ENCOUNTER — Other Ambulatory Visit: Payer: Self-pay

## 2023-06-29 VITALS — BP 129/69 | HR 87 | Temp 96.8°F | Resp 18

## 2023-06-29 DIAGNOSIS — D509 Iron deficiency anemia, unspecified: Secondary | ICD-10-CM | POA: Diagnosis not present

## 2023-06-29 MED ORDER — SODIUM CHLORIDE 0.9% FLUSH
10.0000 mL | Freq: Once | INTRAVENOUS | Status: AC | PRN
Start: 2023-06-29 — End: 2023-06-29
  Administered 2023-06-29: 10 mL
  Filled 2023-06-29: qty 10

## 2023-06-29 MED ORDER — IRON SUCROSE 20 MG/ML IV SOLN
200.0000 mg | Freq: Once | INTRAVENOUS | Status: AC
  Administered 2023-06-29: 200 mg via INTRAVENOUS

## 2023-06-30 ENCOUNTER — Encounter: Payer: Self-pay | Admitting: Oncology

## 2023-06-30 ENCOUNTER — Telehealth: Payer: Self-pay | Admitting: Gastroenterology

## 2023-06-30 NOTE — Telephone Encounter (Signed)
 The patient called stating that her hematologist advised her to schedule an appointment with Dr. Baldomero Bone. I returned her call to confirm that we received her message. The patient is scheduled with Dr. Baldomero Bone on Jul 09, 2023, at 3:00 PM in the [place] office.

## 2023-07-01 ENCOUNTER — Ambulatory Visit: Payer: PPO | Admitting: Physical Therapy

## 2023-07-02 ENCOUNTER — Other Ambulatory Visit: Payer: Self-pay

## 2023-07-02 MED ORDER — GABAPENTIN 400 MG PO CAPS
400.0000 mg | ORAL_CAPSULE | Freq: Three times a day (TID) | ORAL | 1 refills | Status: AC
  Filled 2023-07-02: qty 270, 90d supply, fill #0
  Filled 2023-10-07 – 2023-12-30 (×3): qty 270, 90d supply, fill #1

## 2023-07-06 ENCOUNTER — Ambulatory Visit: Payer: PPO | Admitting: Physical Therapy

## 2023-07-06 ENCOUNTER — Inpatient Hospital Stay

## 2023-07-06 VITALS — BP 138/76 | HR 82 | Temp 97.2°F | Resp 18

## 2023-07-06 DIAGNOSIS — D509 Iron deficiency anemia, unspecified: Secondary | ICD-10-CM

## 2023-07-06 DIAGNOSIS — E1142 Type 2 diabetes mellitus with diabetic polyneuropathy: Secondary | ICD-10-CM | POA: Diagnosis not present

## 2023-07-06 MED ORDER — IRON SUCROSE 20 MG/ML IV SOLN
200.0000 mg | Freq: Once | INTRAVENOUS | Status: AC
  Administered 2023-07-06: 200 mg via INTRAVENOUS

## 2023-07-06 NOTE — Patient Instructions (Signed)

## 2023-07-08 ENCOUNTER — Ambulatory Visit: Payer: PPO | Admitting: Physical Therapy

## 2023-07-09 ENCOUNTER — Ambulatory Visit (INDEPENDENT_AMBULATORY_CARE_PROVIDER_SITE_OTHER): Admitting: Gastroenterology

## 2023-07-09 VITALS — BP 138/75 | HR 90 | Temp 98.2°F

## 2023-07-09 DIAGNOSIS — M9905 Segmental and somatic dysfunction of pelvic region: Secondary | ICD-10-CM | POA: Diagnosis not present

## 2023-07-09 DIAGNOSIS — K5909 Other constipation: Secondary | ICD-10-CM | POA: Diagnosis not present

## 2023-07-09 DIAGNOSIS — D509 Iron deficiency anemia, unspecified: Secondary | ICD-10-CM

## 2023-07-09 DIAGNOSIS — D508 Other iron deficiency anemias: Secondary | ICD-10-CM

## 2023-07-09 MED ORDER — LINACLOTIDE 72 MCG PO CAPS: 72.0000 ug | ORAL_CAPSULE | Freq: Every day | ORAL | Status: DC

## 2023-07-09 NOTE — Progress Notes (Signed)
 [name], MD [address]  [address]  [place], [place] [address]  Main: [phone]  Fax: [phone]    Gastroenterology Consultation  Referring Provider:     Rex Castor, MD Primary Care Physician:  Rex Castor, MD Primary Gastroenterologist: [facility], Dr. Eugenia Hess Reason for Consultation:     Constipation        HPI:   [name] is a 73 y.o. female referred by Dr. Rex Castor, MD  for consultation & management of chronic constipation.  Patient had history of possible Crohn's s/p ileal resection in 1990s.  She also has history of pelvic floor dyssynergia, underwent biofeedback in the past.  She has history of chronic constipation, reports having hard bowel movements with incomplete emptying.  When patient tried MiraLAX or Metamucil Linzess , that has resulted in diarrheal episodes.  He was previously seen by Dr. Peg Bouton, was referred to [facility] for second opinion.  She was told to stop the above medications, and she was told she has bile salt associated diarrhea, therefore she was started on Colestid which has resulted in constipation.  She decreased the dose to 1 pill daily.  For the last 1 month, she stopped Colestid because of severe constipation.  Usually, her initial BM is hard and lumpy, on Bristol stool scale 1, subsequent bowel movements become softer.  She has history of stroke resulting in left-sided weakness as well as partial vision loss which has limited her ability to be physically active.  Also, it is challenging for her to reach bathroom before she has a BM because of her left-sided weakness.  She manages to drink adequate amount of water.  She does have some fiber in her diet.  She had past history of iron  deficiency and B12 deficiency anemia.  Follow-up visit 07/09/2023 Ms. Benfer is here for follow-up of iron  deficiency anemia.  She was recently found to have severe microcytic anemia, hemoglobin of 6.6 dropped from 11  since 02/2021, MCV 63.8, normal platelets.  Patient was first seen by her PCP on 06/16/2023 because she was experiencing fatigue and feeling very cold.  Her serum ferritin was 3, she was started on oral iron  and has been taking multivitamin as well.  She did notice some improvement in her symptoms.  She denies any rectal bleeding, black stools, other than mild vague right-sided upper abdominal discomfort.  She denies any weight loss.  She does severe constipation, has been taking Colace only which just does not help much.  She received 1 unit of PRBCs and also IV iron  weekly for 4 doses.  NSAIDs: None  Antiplts/Anticoagulants/Anti thrombotics: None  GI Procedures:  Colonoscopy 03/11/2019 DIAGNOSIS    A. Ileum, terminal, endoscopic biopsy: Small intestinal mucosa with no significant pathologic finding.   B. Ileum, anastomosis, endoscopic biopsy: Intestinal mucosa with lamina propria fibrosis and ulceration, consistent with anastomotic site.   C. Colon, ascending, endoscopic biopsy: Colonic mucosa with no significant pathologic finding.   D. Colon, transverse, endoscopic biopsy: Colonic mucosa with no significant pathologic finding.  E. Colon, descending, endoscopic biopsy: Colonic mucosa with no significant pathologic finding.  F. Colon, rectosigmoid, endoscopic biopsy: Colonic mucosa with no significant pathologic finding.   Comment: The patient's history of Crohn's disease is noted. All parts are negative for granulomas or dysplasia  Past Medical History:  Diagnosis Date   Abdominal pain 12/06/2013   Abnormality of gait 01/26/2013   Altered bowel habits 12/11/2017   Bruises easily    Constipation  Crohn's disease (HCC)    Depression    Dizziness and giddiness 01/26/2013   Fecal incontinence alternating with constipation 12/11/2017   GERD (gastroesophageal reflux disease)    Hemianopia    left   Hemiparesis and alteration of sensations as late effects of stroke (HCC) 01/26/2013    Hyperlipidemia    Hypertension    Hypothyroidism    Lump in female breast    Panic disorder    PONV (postoperative nausea and vomiting)    Stroke (HCC) 2009   Tremor, essential 04/24/2020   Type II diabetes mellitus [facility])     Past Surgical History:  Procedure Laterality Date   ANAL FISSURE REPAIR  02/24/2006   APPENDECTOMY  02/25/1984   BREAST BIOPSY Left 02/25/2004   neg   BREAST LUMPECTOMY Right 10/25/1988   CESAREAN SECTION  1981; 1983   CHOLECYSTECTOMY  02/25/1984   COLONOSCOPY  12/2019   SMALL INTESTINE SURGERY  05/26/1991   TUBAL LIGATION  02/24/1981    Current Outpatient Medications:    aspirin  325 MG tablet, Take 1 tablet (325 mg total) by mouth daily., Disp: 30 tablet, Rfl: 2   clopidogrel  (PLAVIX ) 75 MG tablet, Take 1 tablet (75 mg total) by mouth daily. (Patient taking differently: Take 75 mg by mouth every evening.), Disp: 30 tablet, Rfl: 0   clopidogrel  (PLAVIX ) 75 MG tablet, Take 1 tablet (75 mg total) by mouth daily., Disp: 90 tablet, Rfl: 1   Continuous Glucose Sensor (GUARDIAN SENSOR 3) MISC, Use 1 every 10 days., Disp: 3 each, Rfl: 11   Dulaglutide  (TRULICITY ) 1.5 MG/0.5ML SOAJ, Inject 1.5 mg into the skin once a week., Disp: 2 mL, Rfl: 11   ferrous sulfate 325 (65 FE) MG EC tablet, Take 325 mg by mouth 3 (three) times daily with meals., Disp: , Rfl:    fluticasone  (FLONASE ) 50 MCG/ACT nasal spray, Place 2 sprays into both nostrils daily., Disp: 16 g, Rfl: 5   gabapentin  (NEURONTIN ) 300 MG capsule, Take 2 capsules (600 mg total) by mouth 3 (three) times daily., Disp: 180 capsule, Rfl: 1   gabapentin  (NEURONTIN ) 400 MG capsule, Take 1 capsule (400 mg total) by mouth 3 (three) times daily., Disp: 270 capsule, Rfl: 1   glucose blood test strip, Use to test blood sugar  3 (three) times daily., Disp: 100 each, Rfl: 11   insulin  degludec (TRESIBA  FLEXTOUCH) 100 UNIT/ML FlexTouch Pen, Inject 30 Units into the skin in the morning., Disp: , Rfl:    insulin  degludec  (TRESIBA  FLEXTOUCH) 100 UNIT/ML FlexTouch Pen, Inject 30 Units into the skin daily., Disp: 15 mL, Rfl: 11   ketoconazole  (NIZORAL ) 2 % cream, Apply 1 Application topically to groin and under arms 1 (one) to 2 (two) times daily as needed for rash., Disp: 60 g, Rfl: 6   levothyroxine  (SYNTHROID ) 125 MCG tablet, Take 1 tablet (125 mcg total) by mouth daily Monday - Saturday., Disp: 90 tablet, Rfl: 3   metFORMIN  (GLUCOPHAGE ) 500 MG tablet, Take 1 tablet (500 mg total) by mouth 2 (two) times daily with meals, Disp: 180 tablet, Rfl: 3   metroNIDAZOLE  (METROCREAM ) 0.75 % cream, Apply qd/bid to face for rosacea, Disp: 45 g, Rfl: 11   metroNIDAZOLE  (METROCREAM ) 0.75 % cream, Apply 1 Application topically to face 1 (one) to 2 (two) times daily for rosacea., Disp: 45 g, Rfl: 10   Multiple Vitamin (MULTIVITAMIN) tablet, Take 1 tablet by mouth daily., Disp: , Rfl:    pantoprazole  (PROTONIX ) 40 MG tablet, Take 40 mg  by mouth in the morning., Disp: , Rfl:    pantoprazole  (PROTONIX ) 40 MG tablet, Take 1 tablet (40 mg total) by mouth in the morning., Disp: 90 tablet, Rfl: 1   ramipril  (ALTACE ) 2.5 MG capsule, Take 2.5 mg by mouth every evening., Disp: , Rfl:    ramipril  (ALTACE ) 2.5 MG capsule, Take 1 capsule (2.5 mg total) by mouth daily., Disp: 90 capsule, Rfl: 1   rosuvastatin  (CRESTOR ) 20 MG tablet, Take 1 tablet (20 mg total) by mouth daily., Disp: 90 tablet, Rfl: 3   Family History  Problem Relation Age of Onset   Heart disease Mother    Heart disease Father    Diabetes Brother    Diabetes Brother    Migraines Son      Social History   Tobacco Use   Smoking status: Former    Current packs/day: 0.00    Average packs/day: 1 pack/day for 20.0 years (20.0 ttl pk-yrs)    Types: Cigarettes    Start date: 04/16/1973    Quit date: 04/16/1993    Years since quitting: 30.2   Smokeless tobacco: Never  Substance Use Topics   Alcohol use: Not Currently    Comment: "haven't drank since early 1980's"   Drug  use: No    Allergies as of 07/09/2023 - Review Complete 07/09/2023  Allergen Reaction Noted   Codeine Other (See Comments) 04/17/2011   Morphine  and codeine Nausea And Vomiting and Other (See Comments) 04/17/2011   Baclofen Nausea Only and Other (See Comments) 01/26/2013   Penicillins Other (See Comments) 12/06/2013   Sulfamethoxazole Itching 12/10/2017   Elemental sulfur Itching 04/17/2011   Metformin  hcl Other (See Comments) and Diarrhea 01/30/2020    Review of Systems:    All systems reviewed and negative except where noted in HPI.   Physical Exam:  BP 138/75 (BP Location: Right Arm, Patient Position: Sitting, Cuff Size: Normal)   Pulse 90   Temp 98.2 F (36.8 C) (Oral)  No LMP recorded. Patient is postmenopausal.  General:   Alert,  Well-developed, well-nourished, pleasant and cooperative in NAD Head:  Normocephalic and atraumatic. Eyes:  Sclera clear, no icterus.   Conjunctiva pink. Ears:  Normal auditory acuity. Nose:  No deformity, discharge, or lesions. Mouth:  No deformity or lesions,oropharynx pink & moist. Neck:  Supple; no masses or thyromegaly. Lungs:  Respirations even and unlabored.  Clear throughout to auscultation.   No wheezes, crackles, or rhonchi. No acute distress. Heart:  Regular rate and rhythm; no murmurs, clicks, rubs, or gallops. Abdomen:  Normal bowel sounds. Soft, non-tender and non-distended without masses, hepatosplenomegaly or hernias noted.  No guarding or rebound tenderness.   Rectal: Not performed Msk: Left upper and lower extremity weakness, patient appears left lower extremity orthotics Pulses:  Normal pulses noted. Extremities:  No clubbing or edema.  No cyanosis. Neurologic:  Alert and oriented x3;  grossly normal neurologically. Skin:  Intact without significant lesions or rashes. No jaundice. Psych:  Alert and cooperative. Normal mood and affect.  Imaging Studies: Reviewed  Assessment and Plan:   Leesa Leifheit is a 73 y.o. female  with history of stroke resulting in left-sided hemiparesis, on aspirin  320 mg as well as Plavix  75 mg daily ileal resection in 1990s, with ileoileal anastomosis, no evidence of active Crohn's disease chronic constipation, pelvic floor dyssynergia is seen in consultation for new onset of severe iron  deficiency anemia   Severe iron  deficiency anemia in setting of DAPT Discussed with patient regarding upper endoscopy and  colonoscopy, possible video capsule endoscopy.  Because of her limited functional status, she preferred to undergo upper endoscopy first and based on findings, she is agreeable to undergo colonoscopy Will schedule upper endoscopy after obtaining clearance from neurology as well as cardiology She can switch to aspirin  81 mg daily when she stops 325 mg  Chronic constipation, with pelvic floor dyssynergia Continue high-fiber diet Adequate intake of water Recommend Linzess  72 MCG daily  Follow up in 3 months   [name], MD

## 2023-07-10 ENCOUNTER — Encounter: Payer: Self-pay | Admitting: Gastroenterology

## 2023-07-10 ENCOUNTER — Telehealth: Payer: Self-pay

## 2023-07-10 DIAGNOSIS — K5909 Other constipation: Secondary | ICD-10-CM

## 2023-07-10 NOTE — Telephone Encounter (Signed)
 Procedure Clearance faxed to Neurology and Cardiology

## 2023-07-13 ENCOUNTER — Ambulatory Visit: Payer: PPO | Admitting: Physical Therapy

## 2023-07-13 ENCOUNTER — Inpatient Hospital Stay

## 2023-07-13 VITALS — BP 133/72 | HR 83 | Temp 97.1°F | Resp 18

## 2023-07-13 DIAGNOSIS — D509 Iron deficiency anemia, unspecified: Secondary | ICD-10-CM

## 2023-07-13 MED ORDER — IRON SUCROSE 20 MG/ML IV SOLN
200.0000 mg | Freq: Once | INTRAVENOUS | Status: AC
  Administered 2023-07-13: 200 mg via INTRAVENOUS

## 2023-07-13 MED ORDER — SODIUM CHLORIDE 0.9 % IV SOLN
INTRAVENOUS | Status: DC
  Filled 2023-07-13: qty 250

## 2023-07-13 NOTE — Patient Instructions (Signed)

## 2023-07-14 DIAGNOSIS — E113393 Type 2 diabetes mellitus with moderate nonproliferative diabetic retinopathy without macular edema, bilateral: Secondary | ICD-10-CM | POA: Diagnosis not present

## 2023-07-15 ENCOUNTER — Ambulatory Visit: Payer: PPO | Admitting: Physical Therapy

## 2023-07-21 ENCOUNTER — Other Ambulatory Visit: Payer: Self-pay

## 2023-07-21 MED ORDER — LINACLOTIDE 72 MCG PO CAPS
72.0000 ug | ORAL_CAPSULE | Freq: Every day | ORAL | 2 refills | Status: DC
  Filled 2023-07-21: qty 90, 90d supply, fill #0
  Filled 2023-11-18 – 2023-12-10 (×5): qty 90, 90d supply, fill #1

## 2023-07-21 NOTE — Addendum Note (Signed)
 Addended by: Lovie Rudder on: 07/21/2023 09:47 AM   Modules accepted: Orders

## 2023-07-22 ENCOUNTER — Ambulatory Visit: Payer: PPO | Admitting: Physical Therapy

## 2023-07-22 ENCOUNTER — Other Ambulatory Visit: Payer: Self-pay

## 2023-07-22 DIAGNOSIS — Z8673 Personal history of transient ischemic attack (TIA), and cerebral infarction without residual deficits: Secondary | ICD-10-CM | POA: Diagnosis not present

## 2023-07-22 DIAGNOSIS — R262 Difficulty in walking, not elsewhere classified: Secondary | ICD-10-CM | POA: Diagnosis not present

## 2023-07-22 DIAGNOSIS — G250 Essential tremor: Secondary | ICD-10-CM | POA: Diagnosis not present

## 2023-07-22 DIAGNOSIS — I69359 Hemiplegia and hemiparesis following cerebral infarction affecting unspecified side: Secondary | ICD-10-CM | POA: Diagnosis not present

## 2023-07-22 DIAGNOSIS — H547 Unspecified visual loss: Secondary | ICD-10-CM | POA: Diagnosis not present

## 2023-07-22 MED ORDER — GABAPENTIN 400 MG PO CAPS
400.0000 mg | ORAL_CAPSULE | Freq: Three times a day (TID) | ORAL | 1 refills | Status: AC
  Filled 2023-10-07 (×2): qty 270, 90d supply, fill #0
  Filled 2024-03-29: qty 270, 90d supply, fill #1

## 2023-07-24 ENCOUNTER — Encounter: Payer: Self-pay | Admitting: Gastroenterology

## 2023-07-27 ENCOUNTER — Ambulatory Visit: Payer: PPO | Admitting: Physical Therapy

## 2023-07-27 ENCOUNTER — Inpatient Hospital Stay: Attending: Oncology

## 2023-07-27 ENCOUNTER — Inpatient Hospital Stay

## 2023-07-27 VITALS — BP 133/76 | HR 79 | Temp 97.3°F | Resp 18

## 2023-07-27 DIAGNOSIS — D509 Iron deficiency anemia, unspecified: Secondary | ICD-10-CM

## 2023-07-27 MED ORDER — IRON SUCROSE 20 MG/ML IV SOLN
200.0000 mg | Freq: Once | INTRAVENOUS | Status: AC
  Administered 2023-07-27: 200 mg via INTRAVENOUS
  Filled 2023-07-27: qty 10

## 2023-07-27 MED ORDER — SODIUM CHLORIDE 0.9% FLUSH
10.0000 mL | Freq: Once | INTRAVENOUS | Status: AC | PRN
  Administered 2023-07-27: 10 mL
  Filled 2023-07-27: qty 10

## 2023-07-29 ENCOUNTER — Ambulatory Visit: Payer: PPO | Admitting: Physical Therapy

## 2023-07-29 DIAGNOSIS — E7800 Pure hypercholesterolemia, unspecified: Secondary | ICD-10-CM | POA: Diagnosis not present

## 2023-07-29 DIAGNOSIS — Z8673 Personal history of transient ischemic attack (TIA), and cerebral infarction without residual deficits: Secondary | ICD-10-CM | POA: Diagnosis not present

## 2023-07-29 DIAGNOSIS — I10 Essential (primary) hypertension: Secondary | ICD-10-CM | POA: Diagnosis not present

## 2023-07-29 DIAGNOSIS — I639 Cerebral infarction, unspecified: Secondary | ICD-10-CM | POA: Diagnosis not present

## 2023-08-01 ENCOUNTER — Encounter: Payer: Self-pay | Admitting: Gastroenterology

## 2023-08-03 ENCOUNTER — Other Ambulatory Visit: Payer: Self-pay

## 2023-08-03 ENCOUNTER — Inpatient Hospital Stay

## 2023-08-03 ENCOUNTER — Ambulatory Visit: Payer: PPO | Admitting: Physical Therapy

## 2023-08-03 ENCOUNTER — Other Ambulatory Visit

## 2023-08-03 DIAGNOSIS — D509 Iron deficiency anemia, unspecified: Secondary | ICD-10-CM | POA: Diagnosis not present

## 2023-08-03 LAB — HEMOGLOBIN AND HEMATOCRIT (CANCER CENTER ONLY)
HCT: 37.7 % (ref 36.0–46.0)
Hemoglobin: 11.4 g/dL — ABNORMAL LOW (ref 12.0–15.0)

## 2023-08-03 LAB — SAMPLE TO BLOOD BANK

## 2023-08-03 MED ORDER — INSULIN PEN NEEDLE 32G X 4 MM MISC
3 refills | Status: AC
  Filled 2023-08-03: qty 100, 100d supply, fill #0
  Filled 2023-10-12 – 2023-11-06 (×2): qty 100, 100d supply, fill #1
  Filled 2024-01-15 – 2024-01-27 (×2): qty 100, 100d supply, fill #2

## 2023-08-03 NOTE — Telephone Encounter (Signed)
 Received clearance from both, per cardiology, pt will need to hold blood thinner 5 days prior and can restart per GI provider's direction  Clearances sent to be scanned

## 2023-08-04 ENCOUNTER — Encounter: Payer: Self-pay | Admitting: Oncology

## 2023-08-04 ENCOUNTER — Other Ambulatory Visit: Payer: Self-pay

## 2023-08-05 ENCOUNTER — Ambulatory Visit: Payer: PPO | Admitting: Physical Therapy

## 2023-08-06 DIAGNOSIS — E1142 Type 2 diabetes mellitus with diabetic polyneuropathy: Secondary | ICD-10-CM | POA: Diagnosis not present

## 2023-08-10 ENCOUNTER — Ambulatory Visit: Payer: PPO | Admitting: Physical Therapy

## 2023-08-12 ENCOUNTER — Encounter: Payer: Self-pay | Admitting: Oncology

## 2023-08-12 ENCOUNTER — Ambulatory Visit: Payer: PPO | Admitting: Physical Therapy

## 2023-08-12 NOTE — Telephone Encounter (Signed)
 New referral faxed to [facility] for pt to re-establish care with Dr. Baldomero Bone

## 2023-08-13 ENCOUNTER — Encounter: Payer: Self-pay | Admitting: Podiatry

## 2023-08-14 ENCOUNTER — Other Ambulatory Visit: Payer: Self-pay

## 2023-08-17 ENCOUNTER — Ambulatory Visit: Payer: PPO | Admitting: Physical Therapy

## 2023-08-19 ENCOUNTER — Ambulatory Visit: Payer: PPO | Admitting: Physical Therapy

## 2023-08-24 ENCOUNTER — Ambulatory Visit: Payer: PPO | Admitting: Physical Therapy

## 2023-08-26 ENCOUNTER — Ambulatory Visit: Payer: PPO | Admitting: Physical Therapy

## 2023-08-31 ENCOUNTER — Ambulatory Visit: Payer: PPO | Admitting: Physical Therapy

## 2023-09-02 ENCOUNTER — Ambulatory Visit: Payer: PPO | Admitting: Physical Therapy

## 2023-09-05 DIAGNOSIS — E1142 Type 2 diabetes mellitus with diabetic polyneuropathy: Secondary | ICD-10-CM | POA: Diagnosis not present

## 2023-09-06 ENCOUNTER — Other Ambulatory Visit: Payer: Self-pay

## 2023-09-07 ENCOUNTER — Other Ambulatory Visit: Payer: Self-pay

## 2023-09-07 ENCOUNTER — Ambulatory Visit: Payer: PPO | Admitting: Physical Therapy

## 2023-09-08 ENCOUNTER — Other Ambulatory Visit: Payer: Self-pay

## 2023-09-08 DIAGNOSIS — Z794 Long term (current) use of insulin: Secondary | ICD-10-CM | POA: Diagnosis not present

## 2023-09-08 DIAGNOSIS — E1169 Type 2 diabetes mellitus with other specified complication: Secondary | ICD-10-CM | POA: Diagnosis not present

## 2023-09-08 DIAGNOSIS — E1142 Type 2 diabetes mellitus with diabetic polyneuropathy: Secondary | ICD-10-CM | POA: Diagnosis not present

## 2023-09-08 DIAGNOSIS — E1159 Type 2 diabetes mellitus with other circulatory complications: Secondary | ICD-10-CM | POA: Diagnosis not present

## 2023-09-08 DIAGNOSIS — E785 Hyperlipidemia, unspecified: Secondary | ICD-10-CM | POA: Diagnosis not present

## 2023-09-08 DIAGNOSIS — I152 Hypertension secondary to endocrine disorders: Secondary | ICD-10-CM | POA: Diagnosis not present

## 2023-09-08 MED ORDER — METFORMIN HCL 500 MG PO TABS
ORAL_TABLET | ORAL | 3 refills | Status: AC
  Filled 2023-09-08 – 2023-10-27 (×3): qty 270, 90d supply, fill #0
  Filled 2024-01-18 – 2024-01-20 (×2): qty 270, 90d supply, fill #1

## 2023-09-08 MED ORDER — CLOPIDOGREL BISULFATE 75 MG PO TABS
75.0000 mg | ORAL_TABLET | Freq: Every day | ORAL | 1 refills | Status: DC
  Filled 2023-09-08: qty 90, 90d supply, fill #0
  Filled 2023-12-09: qty 90, 90d supply, fill #1

## 2023-09-09 ENCOUNTER — Ambulatory Visit: Admitting: Physical Therapy

## 2023-09-11 ENCOUNTER — Other Ambulatory Visit: Payer: Self-pay

## 2023-09-14 ENCOUNTER — Other Ambulatory Visit: Payer: Self-pay

## 2023-09-14 ENCOUNTER — Ambulatory Visit: Admitting: Physical Therapy

## 2023-09-16 ENCOUNTER — Ambulatory Visit: Admitting: Physical Therapy

## 2023-09-21 ENCOUNTER — Ambulatory Visit: Admitting: Physical Therapy

## 2023-09-22 ENCOUNTER — Other Ambulatory Visit: Payer: Self-pay

## 2023-09-22 DIAGNOSIS — R140 Abdominal distension (gaseous): Secondary | ICD-10-CM | POA: Diagnosis not present

## 2023-09-22 DIAGNOSIS — K5909 Other constipation: Secondary | ICD-10-CM | POA: Diagnosis not present

## 2023-09-22 DIAGNOSIS — D509 Iron deficiency anemia, unspecified: Secondary | ICD-10-CM | POA: Diagnosis not present

## 2023-09-22 MED ORDER — XIFAXAN 550 MG PO TABS
550.0000 mg | ORAL_TABLET | Freq: Three times a day (TID) | ORAL | 0 refills | Status: DC
  Filled 2023-09-22 – 2023-09-28 (×3): qty 42, 14d supply, fill #0

## 2023-09-23 ENCOUNTER — Ambulatory Visit: Admitting: Physical Therapy

## 2023-09-24 ENCOUNTER — Other Ambulatory Visit: Payer: Self-pay

## 2023-09-25 ENCOUNTER — Other Ambulatory Visit: Payer: Self-pay

## 2023-09-28 ENCOUNTER — Ambulatory Visit: Admitting: Physical Therapy

## 2023-09-28 ENCOUNTER — Other Ambulatory Visit: Payer: Self-pay

## 2023-09-28 ENCOUNTER — Inpatient Hospital Stay: Attending: Oncology

## 2023-09-28 DIAGNOSIS — K5090 Crohn's disease, unspecified, without complications: Secondary | ICD-10-CM | POA: Diagnosis not present

## 2023-09-28 DIAGNOSIS — D509 Iron deficiency anemia, unspecified: Secondary | ICD-10-CM | POA: Diagnosis not present

## 2023-09-28 LAB — CBC WITH DIFFERENTIAL (CANCER CENTER ONLY)
Abs Immature Granulocytes: 0.02 K/uL (ref 0.00–0.07)
Basophils Absolute: 0 K/uL (ref 0.0–0.1)
Basophils Relative: 1 %
Eosinophils Absolute: 0.3 K/uL (ref 0.0–0.5)
Eosinophils Relative: 3 %
HCT: 35.7 % — ABNORMAL LOW (ref 36.0–46.0)
Hemoglobin: 11.8 g/dL — ABNORMAL LOW (ref 12.0–15.0)
Immature Granulocytes: 0 %
Lymphocytes Relative: 29 %
Lymphs Abs: 2.4 K/uL (ref 0.7–4.0)
MCH: 27.1 pg (ref 26.0–34.0)
MCHC: 33.1 g/dL (ref 30.0–36.0)
MCV: 82.1 fL (ref 80.0–100.0)
Monocytes Absolute: 0.8 K/uL (ref 0.1–1.0)
Monocytes Relative: 9 %
Neutro Abs: 4.7 K/uL (ref 1.7–7.7)
Neutrophils Relative %: 58 %
Platelet Count: 206 K/uL (ref 150–400)
RBC: 4.35 MIL/uL (ref 3.87–5.11)
RDW: 16.1 % — ABNORMAL HIGH (ref 11.5–15.5)
WBC Count: 8.2 K/uL (ref 4.0–10.5)
nRBC: 0 % (ref 0.0–0.2)

## 2023-09-28 LAB — FERRITIN: Ferritin: 8 ng/mL — ABNORMAL LOW (ref 11–307)

## 2023-09-28 LAB — RETIC PANEL
Immature Retic Fract: 14.9 % (ref 2.3–15.9)
RBC.: 4.39 MIL/uL (ref 3.87–5.11)
Retic Count, Absolute: 64.1 K/uL (ref 19.0–186.0)
Retic Ct Pct: 1.5 % (ref 0.4–3.1)
Reticulocyte Hemoglobin: 27.1 pg — ABNORMAL LOW (ref >27.9)

## 2023-09-28 LAB — IRON AND TIBC
Iron: 36 ug/dL (ref 28–170)
Saturation Ratios: 7 % — ABNORMAL LOW (ref 10.4–31.8)
TIBC: 500 ug/dL — ABNORMAL HIGH (ref 250–450)
UIBC: 464 ug/dL

## 2023-09-28 LAB — SAMPLE TO BLOOD BANK

## 2023-09-29 ENCOUNTER — Other Ambulatory Visit: Payer: Self-pay

## 2023-09-29 MED ORDER — METRONIDAZOLE 500 MG PO TABS
500.0000 mg | ORAL_TABLET | Freq: Two times a day (BID) | ORAL | 0 refills | Status: DC
  Filled 2023-09-29: qty 28, 14d supply, fill #0

## 2023-09-30 ENCOUNTER — Other Ambulatory Visit: Payer: Self-pay

## 2023-09-30 ENCOUNTER — Encounter: Payer: Self-pay | Admitting: Dermatology

## 2023-09-30 ENCOUNTER — Ambulatory Visit: Admitting: Physical Therapy

## 2023-09-30 DIAGNOSIS — I69051 Hemiplegia and hemiparesis following nontraumatic subarachnoid hemorrhage affecting right dominant side: Secondary | ICD-10-CM | POA: Diagnosis not present

## 2023-09-30 DIAGNOSIS — E039 Hypothyroidism, unspecified: Secondary | ICD-10-CM | POA: Diagnosis not present

## 2023-09-30 DIAGNOSIS — Z9181 History of falling: Secondary | ICD-10-CM | POA: Diagnosis not present

## 2023-09-30 DIAGNOSIS — D500 Iron deficiency anemia secondary to blood loss (chronic): Secondary | ICD-10-CM | POA: Diagnosis not present

## 2023-09-30 DIAGNOSIS — E119 Type 2 diabetes mellitus without complications: Secondary | ICD-10-CM | POA: Diagnosis not present

## 2023-09-30 DIAGNOSIS — Z794 Long term (current) use of insulin: Secondary | ICD-10-CM | POA: Diagnosis not present

## 2023-09-30 DIAGNOSIS — K5010 Crohn's disease of large intestine without complications: Secondary | ICD-10-CM | POA: Diagnosis not present

## 2023-09-30 MED ORDER — CIPROFLOXACIN HCL 500 MG PO TABS
500.0000 mg | ORAL_TABLET | Freq: Two times a day (BID) | ORAL | 0 refills | Status: AC
  Filled 2023-09-30: qty 20, 10d supply, fill #0

## 2023-10-01 ENCOUNTER — Other Ambulatory Visit: Payer: Self-pay

## 2023-10-05 ENCOUNTER — Encounter: Payer: Self-pay | Admitting: Oncology

## 2023-10-05 ENCOUNTER — Ambulatory Visit: Admitting: Physical Therapy

## 2023-10-05 ENCOUNTER — Inpatient Hospital Stay: Admitting: Oncology

## 2023-10-05 ENCOUNTER — Inpatient Hospital Stay

## 2023-10-05 VITALS — BP 131/77 | HR 75 | Temp 98.0°F | Resp 18 | Wt 153.6 lb

## 2023-10-05 VITALS — BP 126/76 | HR 76

## 2023-10-05 DIAGNOSIS — K50919 Crohn's disease, unspecified, with unspecified complications: Secondary | ICD-10-CM

## 2023-10-05 DIAGNOSIS — D509 Iron deficiency anemia, unspecified: Secondary | ICD-10-CM

## 2023-10-05 MED ORDER — IRON SUCROSE 20 MG/ML IV SOLN
200.0000 mg | Freq: Once | INTRAVENOUS | Status: AC
  Administered 2023-10-05 (×2): 200 mg via INTRAVENOUS
  Filled 2023-10-05: qty 10

## 2023-10-05 NOTE — Progress Notes (Signed)
 Hematology/Oncology Consult note [phone] [phone]        REFERRING PROVIDER: Sherial Bail, MD   CHIEF COMPLAINTS/REASON FOR VISIT:  Evaluation of iron  deficient anemia   ASSESSMENT & PLAN:   Iron  deficiency anemia Previous labs reviewed and discussed with patient. Consistent with severe iron  deficiency anemia. Lab Results  Component Value Date   HGB 11.8 (L) 09/28/2023   TIBC 500 (H) 09/28/2023   IRONPCTSAT 7 (L) 09/28/2023   FERRITIN 8 (L) 09/28/2023    Hemoglobin has improved.  Persistent iron  deficient.  Recommend additional IV Venofer  weekly x 4.  Crohn disease (HCC) Recommend patient to continue follow-up with gastroenterology.  She was recently seen by Dr. Unk.  She would like to defer endoscopy due to response to IV iron .   Orders Placed This Encounter  Procedures   CBC with Differential (Cancer Center Only)    Standing Status:   Future    Expected Date:   04/06/2024    Expiration Date:   07/05/2024   Iron  and TIBC    Standing Status:   Future    Expected Date:   04/06/2024    Expiration Date:   07/05/2024   Ferritin    Standing Status:   Future    Expected Date:   04/06/2024    Expiration Date:   07/05/2024   Vitamin B12    Standing Status:   Future    Expected Date:   04/06/2024    Expiration Date:   07/05/2024   Folate    Standing Status:   Future    Expected Date:   04/06/2024    Expiration Date:   07/05/2024   Retic Panel    Standing Status:   Future    Expected Date:   04/06/2024    Expiration Date:   07/05/2024   Follow-up to be determined. All questions were answered. The patient knows to call the clinic with any problems, questions or concerns.  Danielle Cap, MD, PhD [facility] Hematology Oncology 10/05/2023   HISTORY OF PRESENTING ILLNESS:   [name] is a  73 y.o.  female with PMH listed below was seen in consultation at the request of  Danielle Bail, MD  for evaluation of iron  deficiency  anemia.  06/16/2023, patient was seen by primary care provider.  She has experienced some fatigue and feeling cold.  Workup showed a hemoglobin of 6.5, iron  saturation 2%, ferritin 3. Patient was recommended to start Feosol once daily for about a week..  She also takes multivitamin containing 8 mg of iron , though she has not been consistent with the multivitamin. She has noticed some improvement in her symptoms, particularly no longer feeling cold.  Patient reports that has been consuming less red meat and more chicken and [place]. She also consumes steel-cut oatmeal regularly. . To increase her iron  intake, she has started eating Raisin Bran, which contains iron .  She reports constipation, which she attributes to the Feosol, but denies any blood in her stool. She has a history of intermittent constipation and diarrhea, which she manages through dietary changes. No recent changes in bowel habits prior to starting Feosol.  Her past medical history includes Crohn's disease, leading to a small bowel resection approximately 35 years ago, gallbladder removal. . She has a history of stroke and is on Plavix  and aspirin  for stroke prevention. She experiences occasional shortness of breath, particularly with exertion, and occasional lightheadedness, which she attributes to her stroke history.  She has not had a  recent colonoscopy, with the last one performed approximately 2021.  Patient was previously seen by Dr. Unk.   MEDICAL HISTORY:  Past Medical History:  Diagnosis Date   Abdominal pain 12/06/2013   Abnormality of gait 01/26/2013   Altered bowel habits 12/11/2017   Bruises easily    Constipation    Crohn's disease (HCC)    Depression    Dizziness and giddiness 01/26/2013   Fecal incontinence alternating with constipation 12/11/2017   GERD (gastroesophageal reflux disease)    Hemianopia    left   Hemiparesis and alteration of sensations as late effects of stroke (HCC) 01/26/2013    Hyperlipidemia    Hypertension    Hypothyroidism    Lump in female breast    Panic disorder    PONV (postoperative nausea and vomiting)    Stroke (HCC) 2009   Tremor, essential 04/24/2020   Type II diabetes mellitus (HCC)     SURGICAL HISTORY: Past Surgical History:  Procedure Laterality Date   ANAL FISSURE REPAIR  02/24/2006   APPENDECTOMY  02/25/1984   BREAST BIOPSY Left 02/25/2004   neg   BREAST LUMPECTOMY Right 10/25/1988   CESAREAN SECTION  1981; 1983   CHOLECYSTECTOMY  02/25/1984   COLONOSCOPY  12/2019   SMALL INTESTINE SURGERY  05/26/1991   TUBAL LIGATION  02/24/1981    SOCIAL HISTORY: Social History   Socioeconomic History   Marital status: Married    Spouse name: [name]   Number of children: 2   Years of education: doctorate   Highest education level: Not on file  Occupational History   Occupation: Copy  Tobacco Use   Smoking status: Former    Current packs/day: 0.00    Average packs/day: 1 pack/day for 20.0 years (20.0 ttl pk-yrs)    Types: Cigarettes    Start date: 04/16/1973    Quit date: 04/16/1993    Years since quitting: 30.4   Smokeless tobacco: Never  Substance and Sexual Activity   Alcohol use: Not Currently    Comment: haven't drank since early 1980's   Drug use: No   Sexual activity: Yes  Other Topics Concern   Not on file  Social History Narrative   Patient is married [name]), has 2 children   Patient is right handed   Caffeine consumption is rarely, drinks decaf   Social Drivers of Corporate investment banker Strain: Low Risk  (09/30/2023)   Received from [facility]   Overall Financial Resource Strain (CARDIA)    Difficulty of Paying Living Expenses: Not hard at all  Food Insecurity: No Food Insecurity (09/30/2023)   Received from [facility]   Hunger Vital Sign    Within the past 12 months, you worried that your food would run out before you got the money to buy more.: Never true     Within the past 12 months, the food you bought just didn't last and you didn't have money to get more.: Never true  Transportation Needs: No Transportation Needs (09/30/2023)   Received from [facility] - Transportation    In the past 12 months, has lack of transportation kept you from medical appointments or from getting medications?: No    Lack of Transportation (Non-Medical): No  Physical Activity: Not on file  Stress: Not on file  Social Connections: Not on file  Intimate Partner Violence: Not on file    FAMILY HISTORY: Family History  Problem Relation Age of Onset  Heart disease Mother    Heart disease Father    Diabetes Brother    Diabetes Brother    Migraines Son     ALLERGIES:  is allergic to codeine, morphine  and codeine, baclofen, penicillins, sulfamethoxazole, elemental sulfur, and metformin  hcl.  MEDICATIONS:  Current Outpatient Medications  Medication Sig Dispense Refill   aspirin  325 MG tablet Take 1 tablet (325 mg total) by mouth daily. 30 tablet 2   ciprofloxacin  (CIPRO ) 500 MG tablet Take 1 tablet (500 mg total) by mouth 2 (two) times daily for 10 days. 20 tablet 0   clopidogrel  (PLAVIX ) 75 MG tablet Take 1 tablet (75 mg total) by mouth daily. (Patient taking differently: Take 75 mg by mouth every evening.) 30 tablet 0   clopidogrel  (PLAVIX ) 75 MG tablet Take 1 tablet (75 mg total) by mouth daily. 90 tablet 1   clopidogrel  (PLAVIX ) 75 MG tablet Take 1 tablet (75 mg total) by mouth daily. 90 tablet 1   Dulaglutide  (TRULICITY ) 1.5 MG/0.5ML SOAJ Inject 1.5 mg into the skin once a week. 2 mL 11   fluticasone  (FLONASE ) 50 MCG/ACT nasal spray Place 2 sprays into both nostrils daily. 16 g 5   gabapentin  (NEURONTIN ) 400 MG capsule Take 1 capsule (400 mg total) by mouth 3 (three) times daily. 270 capsule 1   gabapentin  (NEURONTIN ) 400 MG capsule Take 1 capsule (400 mg total) by mouth 3 (three) times daily. 270 capsule 1   glucose blood test strip  Use to test blood sugar  3 (three) times daily. 100 each 11   insulin  degludec (TRESIBA  FLEXTOUCH) 100 UNIT/ML FlexTouch Pen Inject 30 Units into the skin in the morning.     insulin  degludec (TRESIBA  FLEXTOUCH) 100 UNIT/ML FlexTouch Pen Inject 30 Units into the skin daily. 15 mL 11   Insulin  Pen Needle (TECHLITE PLUS PEN NEEDLES) 32G X 4 MM MISC Use daily for insulin  administration 100 each 3   ketoconazole  (NIZORAL ) 2 % cream Apply 1 Application topically to groin and under arms 1 (one) to 2 (two) times daily as needed for rash. 60 g 6   levothyroxine  (SYNTHROID ) 125 MCG tablet Take 1 tablet (125 mcg total) by mouth daily Monday - Saturday. 90 tablet 3   linaclotide  (LINZESS ) 72 MCG capsule Take 1 capsule (72 mcg total) by mouth daily before breakfast. 90 capsule 2   metFORMIN  (GLUCOPHAGE ) 500 MG tablet Take 2 tablets (1,000 mg total) by mouth daily with breakfast AND 1 tablet (500 mg total) daily with supper. 270 tablet 3   metroNIDAZOLE  (METROCREAM ) 0.75 % cream Apply qd/bid to face for rosacea 45 g 11   metroNIDAZOLE  (METROCREAM ) 0.75 % cream Apply 1 Application topically to face 1 (one) to 2 (two) times daily for rosacea. 45 g 10   Multiple Vitamin (MULTIVITAMIN) tablet Take 1 tablet by mouth daily.     pantoprazole  (PROTONIX ) 40 MG tablet Take 40 mg by mouth in the morning.     pantoprazole  (PROTONIX ) 40 MG tablet Take 1 tablet (40 mg total) by mouth in the morning. 90 tablet 1   ramipril  (ALTACE ) 2.5 MG capsule Take 2.5 mg by mouth every evening.     ramipril  (ALTACE ) 2.5 MG capsule Take 1 capsule (2.5 mg total) by mouth daily. 90 capsule 1   rosuvastatin  (CRESTOR ) 20 MG tablet Take 1 tablet (20 mg total) by mouth daily. 90 tablet 3   ferrous sulfate 325 (65 FE) MG EC tablet Take 325 mg by mouth 3 (three) times daily with  meals.     gabapentin  (NEURONTIN ) 300 MG capsule Take 2 capsules (600 mg total) by mouth 3 (three) times daily. 180 capsule 1   No current facility-administered  medications for this visit.    Review of Systems  Constitutional:  Positive for fatigue. Negative for appetite change, chills and fever.  HENT:   Negative for hearing loss and voice change.   Eyes:  Negative for eye problems.  Respiratory:  Negative for chest tightness, cough and shortness of breath.   Cardiovascular:  Negative for chest pain.  Gastrointestinal:  Positive for constipation. Negative for abdominal distention, abdominal pain, blood in stool, nausea and vomiting.  Endocrine: Negative for hot flashes.  Genitourinary:  Negative for difficulty urinating and frequency.   Musculoskeletal:  Negative for arthralgias.  Skin:  Negative for itching and rash.  Neurological:  Negative for extremity weakness.  Hematological:  Negative for adenopathy.  Psychiatric/Behavioral:  Negative for confusion.    PHYSICAL EXAMINATION:  Vitals:   10/05/23 1058  BP: 131/77  Pulse: 75  Resp: 18  Temp: 98 F (36.7 C)  SpO2: 99%   Filed Weights   10/05/23 1058  Weight: 153 lb 9.6 oz (69.7 kg)    Physical Exam Constitutional:      General: She is not in acute distress.    Comments: Patient sits in the wheelchair  HENT:     Head: Normocephalic and atraumatic.  Eyes:     General: No scleral icterus. Cardiovascular:     Rate and Rhythm: Normal rate and regular rhythm.  Pulmonary:     Effort: Pulmonary effort is normal. No respiratory distress.     Breath sounds: No wheezing.  Abdominal:     General: Bowel sounds are normal. There is no distension.     Palpations: Abdomen is soft.  Musculoskeletal:     Cervical back: Normal range of motion and neck supple.  Skin:    General: Skin is warm and dry.     Coloration: Skin is pale.     Findings: No erythema or rash.  Neurological:     Mental Status: She is alert and oriented to person, place, and time. Mental status is at baseline.     Comments: Chronic left-sided weakness due to history of stroke.  Psychiatric:        Mood and  Affect: Mood normal.     LABORATORY DATA:  I have reviewed the data as listed    Latest Ref Rng & Units 09/28/2023   12:52 PM 08/03/2023    1:46 PM 06/24/2023    3:53 PM  CBC  WBC 4.0 - 10.5 K/uL 8.2   8.0   Hemoglobin 12.0 - 15.0 g/dL 88.1  88.5  6.6   Hematocrit 36.0 - 46.0 % 35.7  37.7  24.7   Platelets 150 - 400 K/uL 206   270       Latest Ref Rng & Units 03/08/2021    9:38 AM 03/07/2021    3:36 PM 11/01/2020    6:42 PM  CMP  Glucose 70 - 99 mg/dL 880  841    BUN 8 - 23 mg/dL 11  10    Creatinine 9.55 - 1.00 mg/dL 9.40  9.23  9.36   Sodium 135 - 145 mmol/L 139  136    Potassium 3.5 - 5.1 mmol/L 3.7  3.7    Chloride 98 - 111 mmol/L 107  102    CO2 22 - 32 mmol/L 24  27  Calcium  8.9 - 10.3 mg/dL 9.1  9.4    Total Protein 6.5 - 8.1 g/dL  7.5    Total Bilirubin 0.3 - 1.2 mg/dL  0.4    Alkaline Phos 38 - 126 U/L  54    AST 15 - 41 U/L  20    ALT 0 - 44 U/L  15        RADIOGRAPHIC STUDIES: I have personally reviewed the radiological images as listed and agreed with the findings in the report. No results found.

## 2023-10-05 NOTE — Assessment & Plan Note (Signed)
 Previous labs reviewed and discussed with patient. Consistent with severe iron  deficiency anemia. Lab Results  Component Value Date   HGB 11.8 (L) 09/28/2023   TIBC 500 (H) 09/28/2023   IRONPCTSAT 7 (L) 09/28/2023   FERRITIN 8 (L) 09/28/2023    Hemoglobin has improved.  Persistent iron  deficient.  Recommend additional IV Venofer  weekly x 4.

## 2023-10-05 NOTE — Assessment & Plan Note (Signed)
 Recommend patient to continue follow-up with gastroenterology.  She was recently seen by Dr. Unk.  She would like to defer endoscopy due to response to IV iron .

## 2023-10-06 DIAGNOSIS — Z794 Long term (current) use of insulin: Secondary | ICD-10-CM | POA: Diagnosis not present

## 2023-10-06 DIAGNOSIS — E079 Disorder of thyroid, unspecified: Secondary | ICD-10-CM | POA: Diagnosis not present

## 2023-10-06 DIAGNOSIS — E1142 Type 2 diabetes mellitus with diabetic polyneuropathy: Secondary | ICD-10-CM | POA: Diagnosis not present

## 2023-10-06 DIAGNOSIS — Z7982 Long term (current) use of aspirin: Secondary | ICD-10-CM | POA: Diagnosis not present

## 2023-10-06 DIAGNOSIS — K219 Gastro-esophageal reflux disease without esophagitis: Secondary | ICD-10-CM | POA: Diagnosis not present

## 2023-10-06 DIAGNOSIS — I639 Cerebral infarction, unspecified: Secondary | ICD-10-CM | POA: Diagnosis not present

## 2023-10-06 DIAGNOSIS — K5090 Crohn's disease, unspecified, without complications: Secondary | ICD-10-CM | POA: Diagnosis not present

## 2023-10-06 DIAGNOSIS — D509 Iron deficiency anemia, unspecified: Secondary | ICD-10-CM | POA: Diagnosis not present

## 2023-10-06 DIAGNOSIS — Z9181 History of falling: Secondary | ICD-10-CM | POA: Diagnosis not present

## 2023-10-06 DIAGNOSIS — I69354 Hemiplegia and hemiparesis following cerebral infarction affecting left non-dominant side: Secondary | ICD-10-CM | POA: Diagnosis not present

## 2023-10-06 DIAGNOSIS — Z7902 Long term (current) use of antithrombotics/antiplatelets: Secondary | ICD-10-CM | POA: Diagnosis not present

## 2023-10-06 DIAGNOSIS — E119 Type 2 diabetes mellitus without complications: Secondary | ICD-10-CM | POA: Diagnosis not present

## 2023-10-06 DIAGNOSIS — I10 Essential (primary) hypertension: Secondary | ICD-10-CM | POA: Diagnosis not present

## 2023-10-06 DIAGNOSIS — Z79899 Other long term (current) drug therapy: Secondary | ICD-10-CM | POA: Diagnosis not present

## 2023-10-06 DIAGNOSIS — Z7984 Long term (current) use of oral hypoglycemic drugs: Secondary | ICD-10-CM | POA: Diagnosis not present

## 2023-10-06 DIAGNOSIS — R251 Tremor, unspecified: Secondary | ICD-10-CM | POA: Diagnosis not present

## 2023-10-06 DIAGNOSIS — Z7985 Long-term (current) use of injectable non-insulin antidiabetic drugs: Secondary | ICD-10-CM | POA: Diagnosis not present

## 2023-10-06 DIAGNOSIS — Z87891 Personal history of nicotine dependence: Secondary | ICD-10-CM | POA: Diagnosis not present

## 2023-10-06 DIAGNOSIS — E785 Hyperlipidemia, unspecified: Secondary | ICD-10-CM | POA: Diagnosis not present

## 2023-10-07 ENCOUNTER — Other Ambulatory Visit: Payer: Self-pay

## 2023-10-07 ENCOUNTER — Ambulatory Visit: Admitting: Physical Therapy

## 2023-10-07 MED ORDER — TRULICITY 1.5 MG/0.5ML ~~LOC~~ SOAJ
1.5000 mg | SUBCUTANEOUS | 11 refills | Status: AC
  Filled 2023-10-07: qty 2, 28d supply, fill #0
  Filled 2023-11-05: qty 2, 28d supply, fill #1
  Filled 2023-12-01: qty 2, 28d supply, fill #2
  Filled 2023-12-29: qty 2, 28d supply, fill #3
  Filled 2024-01-26: qty 2, 28d supply, fill #4
  Filled 2024-02-21: qty 2, 28d supply, fill #5
  Filled 2024-03-20: qty 2, 28d supply, fill #6

## 2023-10-08 ENCOUNTER — Ambulatory Visit: Admitting: Podiatry

## 2023-10-08 ENCOUNTER — Encounter: Payer: Self-pay | Admitting: Podiatry

## 2023-10-08 DIAGNOSIS — M79675 Pain in left toe(s): Secondary | ICD-10-CM | POA: Diagnosis not present

## 2023-10-08 DIAGNOSIS — E1142 Type 2 diabetes mellitus with diabetic polyneuropathy: Secondary | ICD-10-CM | POA: Diagnosis not present

## 2023-10-08 DIAGNOSIS — B351 Tinea unguium: Secondary | ICD-10-CM | POA: Diagnosis not present

## 2023-10-08 DIAGNOSIS — M79674 Pain in right toe(s): Secondary | ICD-10-CM

## 2023-10-08 DIAGNOSIS — Z794 Long term (current) use of insulin: Secondary | ICD-10-CM

## 2023-10-12 ENCOUNTER — Ambulatory Visit: Admitting: Physical Therapy

## 2023-10-12 ENCOUNTER — Other Ambulatory Visit: Payer: Self-pay

## 2023-10-13 ENCOUNTER — Encounter: Payer: Self-pay | Admitting: Gastroenterology

## 2023-10-13 ENCOUNTER — Other Ambulatory Visit: Payer: Self-pay

## 2023-10-13 ENCOUNTER — Inpatient Hospital Stay

## 2023-10-13 VITALS — BP 129/72 | HR 81 | Temp 98.7°F | Resp 16

## 2023-10-13 DIAGNOSIS — D509 Iron deficiency anemia, unspecified: Secondary | ICD-10-CM | POA: Diagnosis not present

## 2023-10-13 MED ORDER — IRON SUCROSE 20 MG/ML IV SOLN
200.0000 mg | Freq: Once | INTRAVENOUS | Status: AC
  Administered 2023-10-13: 200 mg via INTRAVENOUS
  Filled 2023-10-13: qty 10

## 2023-10-13 NOTE — Patient Instructions (Signed)

## 2023-10-14 ENCOUNTER — Ambulatory Visit: Admitting: Physical Therapy

## 2023-10-14 NOTE — Progress Notes (Signed)
  Subjective:  Patient ID: [name], female    DOB: 26-Mar-1950,  MRN: [id number]  [name] presents to clinic today for at risk foot care with history of diabetic neuropathy and painful thick toenails that are difficult to trim. Pain interferes with ambulation. Aggravating factors include wearing enclosed shoe gear. Pain is relieved with periodic professional debridement.  Chief Complaint  Patient presents with   [facility]    Rm2 Diabetic foot care/ Dr. Lavenia Beaver last visit April 2025/A1c 5   New problem(s): None.   PCP is Beaver Lavenia, MD.  Allergies  Allergen Reactions   Codeine Other (See Comments)    Syncope.   Morphine  And Codeine Nausea And Vomiting and Other (See Comments)    Hallucinations.   Baclofen Nausea Only and Other (See Comments)    dizziness   Penicillins Other (See Comments)    Family history   Sulfamethoxazole Itching    Hands itching    Elemental Sulfur Itching    Per patient happened years ago.   Metformin  Hcl Other (See Comments) and Diarrhea    diarrhea    Review of Systems: Negative except as noted in the HPI.  Objective: No changes noted in today's physical examination. There were no vitals filed for this visit. [name] is a pleasant 73 y.o. female in NAD. AAO x 3.  Vascular Examination: Capillary refill time immediate b/l. Vascular status intact b/l with palpable pedal pulses. Pedal hair present b/l. No pain with calf compression b/l. Skin temperature gradient WNL b/l. No cyanosis or clubbing b/l. No ischemia or gangrene noted b/l. Trace edema noted BLE.  Neurological Examination: Protective sensation diminished with 10g monofilament b/l.  Dermatological Examination: Pedal skin with normal turgor, texture and tone b/l.  No open wounds. No interdigital macerations.   Toenails 1-5 b/l thick, discolored, elongated with subungual debris and pain on dorsal palpation.   No hyperkeratotic nor porokeratotic lesions present on today's  visit.  Musculoskeletal Examination: Dropfoot left lower extremity. Wearing AFO on left lower extremity. Pes planovalgus deformity noted b/l lower extremities. Utilizes wheelchair for mobility assistance.  Radiographs: None  Assessment/Plan: 1. Pain due to onychomycosis of toenails of both feet   2. Type 2 diabetes mellitus with diabetic polyneuropathy, with long-term current use of insulin  [facility])     Patient was evaluated and treated. All patient's and/or POA's questions/concerns addressed on today's visit. Toenails 1-5 debrided in length and girth without incident. Treatment was provided by assistant Mable FABIENE Cherry under my supervision. Continue foot and shoe inspections daily. Monitor blood glucose per PCP/Endocrinologist's recommendations. Continue soft, supportive shoe gear daily. Report any pedal injuries to medical professional. Call office if there are any questions/concerns.  Return in about 3 months (around 01/08/2024).  Delon LITTIE Merlin, DPM      [place] LOCATION: [address] [place] [address]                   Office [phone]   [facility] LOCATION: [address] [place], [place] [address] Office [phone]

## 2023-10-19 ENCOUNTER — Ambulatory Visit: Admitting: Physical Therapy

## 2023-10-21 ENCOUNTER — Ambulatory Visit: Admitting: Physical Therapy

## 2023-10-22 ENCOUNTER — Ambulatory Visit

## 2023-10-27 ENCOUNTER — Other Ambulatory Visit (HOSPITAL_COMMUNITY): Payer: Self-pay

## 2023-10-27 ENCOUNTER — Other Ambulatory Visit: Payer: Self-pay

## 2023-10-28 ENCOUNTER — Ambulatory Visit: Admitting: Physical Therapy

## 2023-10-29 ENCOUNTER — Inpatient Hospital Stay: Attending: Oncology

## 2023-10-29 VITALS — BP 135/76 | HR 85 | Temp 97.2°F | Resp 18

## 2023-10-29 DIAGNOSIS — D509 Iron deficiency anemia, unspecified: Secondary | ICD-10-CM | POA: Insufficient documentation

## 2023-10-29 MED ORDER — IRON SUCROSE 20 MG/ML IV SOLN
200.0000 mg | Freq: Once | INTRAVENOUS | Status: AC
  Administered 2023-10-29: 200 mg via INTRAVENOUS
  Filled 2023-10-29: qty 10

## 2023-10-29 NOTE — Patient Instructions (Signed)

## 2023-11-02 ENCOUNTER — Ambulatory Visit: Admitting: Physical Therapy

## 2023-11-03 ENCOUNTER — Inpatient Hospital Stay

## 2023-11-03 VITALS — BP 125/82 | HR 77 | Temp 96.0°F | Resp 18

## 2023-11-03 DIAGNOSIS — D509 Iron deficiency anemia, unspecified: Secondary | ICD-10-CM | POA: Diagnosis not present

## 2023-11-03 MED ORDER — SODIUM CHLORIDE 0.9% FLUSH
10.0000 mL | Freq: Once | INTRAVENOUS | Status: AC | PRN
  Administered 2023-11-03: 10 mL
  Filled 2023-11-03: qty 10

## 2023-11-03 MED ORDER — IRON SUCROSE 20 MG/ML IV SOLN
200.0000 mg | Freq: Once | INTRAVENOUS | Status: AC
  Administered 2023-11-03: 200 mg via INTRAVENOUS
  Filled 2023-11-03: qty 10

## 2023-11-04 ENCOUNTER — Ambulatory Visit: Admitting: Physical Therapy

## 2023-11-05 ENCOUNTER — Other Ambulatory Visit: Payer: Self-pay

## 2023-11-05 DIAGNOSIS — D509 Iron deficiency anemia, unspecified: Secondary | ICD-10-CM | POA: Diagnosis not present

## 2023-11-05 DIAGNOSIS — I69354 Hemiplegia and hemiparesis following cerebral infarction affecting left non-dominant side: Secondary | ICD-10-CM | POA: Diagnosis not present

## 2023-11-05 DIAGNOSIS — Z794 Long term (current) use of insulin: Secondary | ICD-10-CM | POA: Diagnosis not present

## 2023-11-05 DIAGNOSIS — E785 Hyperlipidemia, unspecified: Secondary | ICD-10-CM | POA: Diagnosis not present

## 2023-11-05 DIAGNOSIS — E119 Type 2 diabetes mellitus without complications: Secondary | ICD-10-CM | POA: Diagnosis not present

## 2023-11-05 DIAGNOSIS — K209 Esophagitis, unspecified without bleeding: Secondary | ICD-10-CM | POA: Diagnosis not present

## 2023-11-05 DIAGNOSIS — K219 Gastro-esophageal reflux disease without esophagitis: Secondary | ICD-10-CM | POA: Diagnosis not present

## 2023-11-06 ENCOUNTER — Other Ambulatory Visit: Payer: Self-pay

## 2023-11-06 DIAGNOSIS — E1142 Type 2 diabetes mellitus with diabetic polyneuropathy: Secondary | ICD-10-CM | POA: Diagnosis not present

## 2023-11-07 ENCOUNTER — Other Ambulatory Visit: Payer: Self-pay | Admitting: Dermatology

## 2023-11-07 ENCOUNTER — Other Ambulatory Visit: Payer: Self-pay

## 2023-11-08 ENCOUNTER — Other Ambulatory Visit: Payer: Self-pay

## 2023-11-08 ENCOUNTER — Other Ambulatory Visit: Payer: Self-pay | Admitting: Dermatology

## 2023-11-09 ENCOUNTER — Other Ambulatory Visit: Payer: Self-pay

## 2023-11-09 ENCOUNTER — Ambulatory Visit: Admitting: Physical Therapy

## 2023-11-09 MED ORDER — PANTOPRAZOLE SODIUM 40 MG PO TBEC
40.0000 mg | DELAYED_RELEASE_TABLET | Freq: Every morning | ORAL | 1 refills | Status: AC
  Filled 2023-11-09: qty 90, 90d supply, fill #0
  Filled 2024-02-03: qty 90, 90d supply, fill #1

## 2023-11-09 MED ORDER — CLOPIDOGREL BISULFATE 75 MG PO TABS
75.0000 mg | ORAL_TABLET | Freq: Every day | ORAL | 1 refills | Status: AC
  Filled 2023-11-09 – 2024-03-09 (×2): qty 90, 90d supply, fill #0

## 2023-11-09 MED ORDER — FLUTICASONE PROPIONATE 50 MCG/ACT NA SUSP
2.0000 | Freq: Every day | NASAL | 1 refills | Status: AC
  Filled 2023-11-09: qty 16, 30d supply, fill #0
  Filled 2024-01-27: qty 16, 30d supply, fill #1

## 2023-11-09 MED FILL — Metronidazole Cream 0.75%: CUTANEOUS | 23 days supply | Qty: 45 | Fill #0 | Status: CN

## 2023-11-11 ENCOUNTER — Ambulatory Visit: Admitting: Physical Therapy

## 2023-11-16 ENCOUNTER — Ambulatory Visit: Admitting: Physical Therapy

## 2023-11-17 ENCOUNTER — Other Ambulatory Visit: Payer: Self-pay

## 2023-11-17 MED ORDER — RAMIPRIL 2.5 MG PO CAPS
2.5000 mg | ORAL_CAPSULE | Freq: Every day | ORAL | 1 refills | Status: AC
  Filled 2023-11-17: qty 90, 90d supply, fill #0
  Filled 2024-02-16: qty 90, 90d supply, fill #1

## 2023-11-17 MED ORDER — ROSUVASTATIN CALCIUM 20 MG PO TABS
20.0000 mg | ORAL_TABLET | Freq: Every day | ORAL | 3 refills | Status: AC
  Filled 2023-11-17 – 2024-01-29 (×2): qty 90, 90d supply, fill #0

## 2023-11-17 MED ORDER — TRESIBA FLEXTOUCH 100 UNIT/ML ~~LOC~~ SOPN
34.0000 [IU] | PEN_INJECTOR | Freq: Every day | SUBCUTANEOUS | 11 refills | Status: AC
  Filled 2023-11-17 – 2023-12-04 (×18): qty 15, 44d supply, fill #0
  Filled 2024-01-10: qty 15, 44d supply, fill #1
  Filled 2024-02-26: qty 15, 44d supply, fill #2

## 2023-11-18 ENCOUNTER — Ambulatory Visit: Admitting: Physical Therapy

## 2023-11-18 ENCOUNTER — Other Ambulatory Visit: Payer: Self-pay

## 2023-11-18 MED FILL — Metronidazole Cream 0.75%: CUTANEOUS | 23 days supply | Qty: 45 | Fill #0 | Status: CN

## 2023-11-19 ENCOUNTER — Other Ambulatory Visit: Payer: Self-pay

## 2023-11-20 ENCOUNTER — Other Ambulatory Visit: Payer: Self-pay

## 2023-11-21 ENCOUNTER — Other Ambulatory Visit: Payer: Self-pay

## 2023-11-22 ENCOUNTER — Other Ambulatory Visit: Payer: Self-pay

## 2023-11-23 ENCOUNTER — Ambulatory Visit: Admitting: Physical Therapy

## 2023-11-23 ENCOUNTER — Other Ambulatory Visit: Payer: Self-pay

## 2023-11-24 ENCOUNTER — Other Ambulatory Visit: Payer: Self-pay

## 2023-11-25 ENCOUNTER — Other Ambulatory Visit: Payer: Self-pay

## 2023-11-25 ENCOUNTER — Ambulatory Visit: Admitting: Physical Therapy

## 2023-11-26 ENCOUNTER — Other Ambulatory Visit: Payer: Self-pay

## 2023-11-27 ENCOUNTER — Other Ambulatory Visit: Payer: Self-pay

## 2023-11-28 ENCOUNTER — Other Ambulatory Visit: Payer: Self-pay

## 2023-11-29 ENCOUNTER — Other Ambulatory Visit: Payer: Self-pay

## 2023-11-30 ENCOUNTER — Other Ambulatory Visit: Payer: Self-pay

## 2023-11-30 ENCOUNTER — Ambulatory Visit: Admitting: Physical Therapy

## 2023-12-01 ENCOUNTER — Other Ambulatory Visit: Payer: Self-pay

## 2023-12-02 ENCOUNTER — Ambulatory Visit: Admitting: Physical Therapy

## 2023-12-02 ENCOUNTER — Other Ambulatory Visit: Payer: Self-pay

## 2023-12-03 ENCOUNTER — Other Ambulatory Visit: Payer: Self-pay

## 2023-12-03 ENCOUNTER — Ambulatory Visit (INDEPENDENT_AMBULATORY_CARE_PROVIDER_SITE_OTHER): Admitting: Podiatry

## 2023-12-03 ENCOUNTER — Encounter: Payer: Self-pay | Admitting: Podiatry

## 2023-12-03 DIAGNOSIS — Z794 Long term (current) use of insulin: Secondary | ICD-10-CM | POA: Diagnosis not present

## 2023-12-03 DIAGNOSIS — E1142 Type 2 diabetes mellitus with diabetic polyneuropathy: Secondary | ICD-10-CM

## 2023-12-03 DIAGNOSIS — M79675 Pain in left toe(s): Secondary | ICD-10-CM

## 2023-12-03 DIAGNOSIS — B351 Tinea unguium: Secondary | ICD-10-CM

## 2023-12-03 DIAGNOSIS — M79674 Pain in right toe(s): Secondary | ICD-10-CM

## 2023-12-04 ENCOUNTER — Other Ambulatory Visit: Payer: Self-pay

## 2023-12-06 DIAGNOSIS — E1142 Type 2 diabetes mellitus with diabetic polyneuropathy: Secondary | ICD-10-CM | POA: Diagnosis not present

## 2023-12-07 ENCOUNTER — Ambulatory Visit: Admitting: Physical Therapy

## 2023-12-09 ENCOUNTER — Other Ambulatory Visit: Payer: Self-pay

## 2023-12-09 ENCOUNTER — Ambulatory Visit: Admitting: Physical Therapy

## 2023-12-09 DIAGNOSIS — K5090 Crohn's disease, unspecified, without complications: Secondary | ICD-10-CM | POA: Diagnosis not present

## 2023-12-09 DIAGNOSIS — K219 Gastro-esophageal reflux disease without esophagitis: Secondary | ICD-10-CM | POA: Diagnosis not present

## 2023-12-09 DIAGNOSIS — I69354 Hemiplegia and hemiparesis following cerebral infarction affecting left non-dominant side: Secondary | ICD-10-CM | POA: Diagnosis not present

## 2023-12-09 DIAGNOSIS — Z794 Long term (current) use of insulin: Secondary | ICD-10-CM | POA: Diagnosis not present

## 2023-12-09 DIAGNOSIS — D509 Iron deficiency anemia, unspecified: Secondary | ICD-10-CM | POA: Diagnosis not present

## 2023-12-09 DIAGNOSIS — E119 Type 2 diabetes mellitus without complications: Secondary | ICD-10-CM | POA: Diagnosis not present

## 2023-12-09 DIAGNOSIS — E785 Hyperlipidemia, unspecified: Secondary | ICD-10-CM | POA: Diagnosis not present

## 2023-12-11 ENCOUNTER — Other Ambulatory Visit (HOSPITAL_COMMUNITY): Payer: Self-pay

## 2023-12-11 ENCOUNTER — Other Ambulatory Visit: Payer: Self-pay

## 2023-12-11 NOTE — Progress Notes (Signed)
  Subjective:  Patient ID: [name], female    DOB: 1950-06-06,  MRN: [id number]  [name] presents to clinic today for at risk foot care with history of diabetic neuropathy and painful mycotic toenails x 10 which interfere with daily activities. Pain is relieved with periodic professional debridement. She states she has an area on left great toe which is most likely from the strap that was on her AFO. She had Pedorthist address it. Chief Complaint  Patient presents with   Toe Pain    Last A1c 7.4. Diabetic foot care. Dr. Sherial is her PCP. Last office visit was in April   New problem(s): None.   PCP is Sherial Bail, MD.  Allergies  Allergen Reactions   Codeine Other (See Comments)    Syncope.   Morphine  And Codeine Nausea And Vomiting and Other (See Comments)    Hallucinations.   Baclofen Nausea Only and Other (See Comments)    dizziness   Penicillins Other (See Comments)    Family history   Sulfamethoxazole Itching    Hands itching    Elemental Sulfur Itching    Per patient happened years ago.   Metformin  Hcl Other (See Comments) and Diarrhea    diarrhea    Review of Systems: Negative except as noted in the HPI.  Objective: No changes noted in today's physical examination. There were no vitals filed for this visit. [name] is a pleasant 73 y.o. female in NAD. AAO x 3.  Vascular Examination: Capillary refill time immediate b/l. Vascular status intact b/l with palpable pedal pulses. Pedal hair present b/l. No pain with calf compression b/l. Skin temperature gradient WNL b/l. No cyanosis or clubbing b/l. No ischemia or gangrene noted b/l. Trace edema noted BLE.  Neurological Examination: Protective sensation diminished with 10g monofilament b/l.  Dermatological Examination: Blanchable erythema noted dorsomedial aspect left hallux. No edema, no deep tissue injury.  Pedal skin with normal turgor, texture and tone b/l.  No open wounds. No interdigital  macerations.   Toenails 1-5 b/l thick, discolored, elongated with subungual debris and pain on dorsal palpation.   No hyperkeratotic nor porokeratotic lesions present on today's visit.  Musculoskeletal Examination: Dropfoot left lower extremity. Wearing AFO on left lower extremity. Pes planovalgus deformity noted b/l lower extremities. Utilizes wheelchair for mobility assistance.  Radiographs: None  Assessment/Plan: 1. Pain due to onychomycosis of toenails of both feet   2. Type 2 diabetes mellitus with diabetic polyneuropathy, with long-term current use of insulin  [facility])   Consent given for treatment. Patient examined. All patient's and/or POA's questions/concerns addressed on today's visit. Mycotic toenails 1-5 debrided in length and girth without incident. Continue foot and shoe inspections daily. Monitor blood glucose per PCP/Endocrinologist's recommendations.Continue soft, supportive shoe gear daily. Report any pedal injuries to medical professional. Call office if there are any quesitons/concerns.  Return in about 3 months (around 03/04/2024).  Delon LITTIE Merlin, DPM      [place] LOCATION: [address], [place] [address]                   Office [phone]   [facility] LOCATION: [address] [place], [place] [address] Office [phone]

## 2023-12-14 ENCOUNTER — Ambulatory Visit: Admitting: Physical Therapy

## 2023-12-14 ENCOUNTER — Other Ambulatory Visit: Payer: Self-pay

## 2023-12-16 ENCOUNTER — Ambulatory Visit: Admitting: Physical Therapy

## 2023-12-21 ENCOUNTER — Ambulatory Visit: Admitting: Physical Therapy

## 2023-12-23 ENCOUNTER — Ambulatory Visit: Admitting: Physical Therapy

## 2023-12-28 ENCOUNTER — Ambulatory Visit: Admitting: Physical Therapy

## 2023-12-30 ENCOUNTER — Ambulatory Visit: Admitting: Physical Therapy

## 2023-12-31 ENCOUNTER — Inpatient Hospital Stay: Attending: Oncology

## 2023-12-31 DIAGNOSIS — D509 Iron deficiency anemia, unspecified: Secondary | ICD-10-CM | POA: Diagnosis not present

## 2023-12-31 DIAGNOSIS — K5090 Crohn's disease, unspecified, without complications: Secondary | ICD-10-CM | POA: Diagnosis not present

## 2023-12-31 DIAGNOSIS — E538 Deficiency of other specified B group vitamins: Secondary | ICD-10-CM | POA: Insufficient documentation

## 2023-12-31 LAB — CBC WITH DIFFERENTIAL (CANCER CENTER ONLY)
Abs Immature Granulocytes: 0.03 K/uL (ref 0.00–0.07)
Basophils Absolute: 0 K/uL (ref 0.0–0.1)
Basophils Relative: 1 %
Eosinophils Absolute: 0.2 K/uL (ref 0.0–0.5)
Eosinophils Relative: 2 %
HCT: 39.9 % (ref 36.0–46.0)
Hemoglobin: 13.2 g/dL (ref 12.0–15.0)
Immature Granulocytes: 0 %
Lymphocytes Relative: 30 %
Lymphs Abs: 2 K/uL (ref 0.7–4.0)
MCH: 28.1 pg (ref 26.0–34.0)
MCHC: 33.1 g/dL (ref 30.0–36.0)
MCV: 85.1 fL (ref 80.0–100.0)
Monocytes Absolute: 0.5 K/uL (ref 0.1–1.0)
Monocytes Relative: 7 %
Neutro Abs: 4 K/uL (ref 1.7–7.7)
Neutrophils Relative %: 60 %
Platelet Count: 186 K/uL (ref 150–400)
RBC: 4.69 MIL/uL (ref 3.87–5.11)
RDW: 15.7 % — ABNORMAL HIGH (ref 11.5–15.5)
WBC Count: 6.8 K/uL (ref 4.0–10.5)
nRBC: 0 % (ref 0.0–0.2)

## 2023-12-31 LAB — IRON AND TIBC
Iron: 54 ug/dL (ref 28–170)
Saturation Ratios: 12 % (ref 10.4–31.8)
TIBC: 456 ug/dL — ABNORMAL HIGH (ref 250–450)
UIBC: 402 ug/dL

## 2023-12-31 LAB — RETIC PANEL
Immature Retic Fract: 11.7 % (ref 2.3–15.9)
RBC.: 4.65 MIL/uL (ref 3.87–5.11)
Retic Count, Absolute: 67.4 K/uL (ref 19.0–186.0)
Retic Ct Pct: 1.5 % (ref 0.4–3.1)
Reticulocyte Hemoglobin: 31.2 pg (ref >27.9)

## 2023-12-31 LAB — FOLATE: Folate: >20 ng/mL (ref >5.9)

## 2023-12-31 LAB — VITAMIN B12: Vitamin B-12: 207 pg/mL (ref 180–914)

## 2023-12-31 LAB — FERRITIN: Ferritin: 18 ng/mL (ref 11–307)

## 2024-01-04 ENCOUNTER — Ambulatory Visit: Admitting: Physical Therapy

## 2024-01-06 ENCOUNTER — Ambulatory Visit: Admitting: Physical Therapy

## 2024-01-06 DIAGNOSIS — E1142 Type 2 diabetes mellitus with diabetic polyneuropathy: Secondary | ICD-10-CM | POA: Diagnosis not present

## 2024-01-07 ENCOUNTER — Inpatient Hospital Stay (HOSPITAL_BASED_OUTPATIENT_CLINIC_OR_DEPARTMENT_OTHER): Admitting: Oncology

## 2024-01-07 ENCOUNTER — Encounter: Payer: Self-pay | Admitting: Oncology

## 2024-01-07 ENCOUNTER — Inpatient Hospital Stay

## 2024-01-07 VITALS — BP 130/73 | HR 81 | Temp 97.1°F | Resp 18 | Wt 154.0 lb

## 2024-01-07 VITALS — BP 126/80 | HR 81

## 2024-01-07 DIAGNOSIS — K50919 Crohn's disease, unspecified, with unspecified complications: Secondary | ICD-10-CM | POA: Diagnosis not present

## 2024-01-07 DIAGNOSIS — D509 Iron deficiency anemia, unspecified: Secondary | ICD-10-CM | POA: Diagnosis not present

## 2024-01-07 DIAGNOSIS — E538 Deficiency of other specified B group vitamins: Secondary | ICD-10-CM | POA: Diagnosis not present

## 2024-01-07 MED ORDER — IRON SUCROSE 20 MG/ML IV SOLN
200.0000 mg | Freq: Once | INTRAVENOUS | Status: AC
  Administered 2024-01-07: 200 mg via INTRAVENOUS
  Filled 2024-01-07: qty 10

## 2024-01-07 MED ORDER — CYANOCOBALAMIN 1000 MCG/ML IJ SOLN
1000.0000 ug | Freq: Once | INTRAMUSCULAR | Status: AC
  Administered 2024-01-07: 1000 ug via INTRAMUSCULAR
  Filled 2024-01-07: qty 1

## 2024-01-07 NOTE — Assessment & Plan Note (Signed)
 Recommend patient to continue follow-up with gastroenterology.

## 2024-01-07 NOTE — Assessment & Plan Note (Addendum)
 Previous labs reviewed and discussed with patient. Consistent with severe iron  deficiency anemia. Lab Results  Component Value Date   HGB 13.2 12/31/2023   TIBC 456 (H) 12/31/2023   IRONPCTSAT 12 12/31/2023   FERRITIN 18 12/31/2023    Hemoglobin has improved.  Persistent increased TIBC.  Recommend 1 additional dose of Venofer  200 mg to further improve iron  stores.

## 2024-01-07 NOTE — Progress Notes (Signed)
 Hematology/Oncology Consult note [phone] [phone]        REFERRING PROVIDER: Sherial Bail, MD   CHIEF COMPLAINTS/REASON FOR VISIT:  Low B12, iron  deficient anemia, chron's disease   ASSESSMENT & PLAN:   Iron  deficiency anemia Previous labs reviewed and discussed with patient. Consistent with severe iron  deficiency anemia. Lab Results  Component Value Date   HGB 13.2 12/31/2023   TIBC 456 (H) 12/31/2023   IRONPCTSAT 12 12/31/2023   FERRITIN 18 12/31/2023    Hemoglobin has improved.  Persistent increased TIBC.  Recommend 1 additional dose of Venofer  200 mg to further improve iron  stores.  Crohn disease (HCC) Recommend patient to continue follow-up with gastroenterology.    Low serum vitamin B12 Vitamin B12 207, less than 400.  Recommend B12 supplementation-1000 mcg IM injection monthly   Orders Placed This Encounter  Procedures   CBC with Differential (Cancer Center Only)    Standing Status:   Future    Expected Date:   07/06/2024    Expiration Date:   10/04/2024   Iron  and TIBC    Standing Status:   Future    Expected Date:   07/06/2024    Expiration Date:   10/04/2024   Ferritin    Standing Status:   Future    Expected Date:   07/06/2024    Expiration Date:   10/04/2024   Vitamin B12    Standing Status:   Future    Expected Date:   07/06/2024    Expiration Date:   10/04/2024   Follow-up 6 months All questions were answered. The patient knows to call the clinic with any problems, questions or concerns.  Zelphia Cap, MD, PhD [facility] Hematology Oncology 01/07/2024   HISTORY OF PRESENTING ILLNESS:   [name] is a  73 y.o.  female with PMH listed below was seen in consultation at the request of  Sherial Bail, MD  for evaluation of iron  deficiency anemia.  06/16/2023, patient was seen by primary care provider.  She has experienced some fatigue and feeling cold.  Workup showed a hemoglobin of 6.5, iron  saturation 2%,  ferritin 3. Patient was recommended to start Feosol once daily for about a week..  She also takes multivitamin containing 8 mg of iron , though she has not been consistent with the multivitamin. She has noticed some improvement in her symptoms, particularly no longer feeling cold.  Patient reports that has been consuming less red meat and more chicken and turkey. She also consumes steel-cut oatmeal regularly. . To increase her iron  intake, she has started eating Raisin Bran, which contains iron .  She reports constipation, which she attributes to the Feosol, but denies any blood in her stool. She has a history of intermittent constipation and diarrhea, which she manages through dietary changes. No recent changes in bowel habits prior to starting Feosol.  Her past medical history includes Crohn's disease, leading to a small bowel resection approximately 35 years ago, gallbladder removal. . She has a history of stroke and is on Plavix  and aspirin  for stroke prevention. She experiences occasional shortness of breath, particularly with exertion, and occasional lightheadedness, which she attributes to her stroke history.  She has not had a recent colonoscopy, with the last one performed approximately 2021.  Patient was previously seen by Dr. Unk.  INTERVAL HISTORY [name] is a 73 y.o. female who has above history reviewed by me today presents for follow up visit for iron  deficient anemia due to chron's disease/history of small bowel  resection.  Patient tolerated IV Venofer  treatments.  She feels fatigue is better.   MEDICAL HISTORY:  Past Medical History:  Diagnosis Date   Abdominal pain 12/06/2013   Abnormality of gait 01/26/2013   Altered bowel habits 12/11/2017   Bruises easily    Constipation    Crohn's disease (HCC)    Depression    Dizziness and giddiness 01/26/2013   Fecal incontinence alternating with constipation 12/11/2017   GERD (gastroesophageal reflux disease)    Hemianopia     left   Hemiparesis and alteration of sensations as late effects of stroke (HCC) 01/26/2013   Hyperlipidemia    Hypertension    Hypothyroidism    Lump in female breast    Panic disorder    PONV (postoperative nausea and vomiting)    Stroke (HCC) 2009   Tremor, essential 04/24/2020   Type II diabetes mellitus (HCC)     SURGICAL HISTORY: Past Surgical History:  Procedure Laterality Date   ANAL FISSURE REPAIR  02/24/2006   APPENDECTOMY  02/25/1984   BREAST BIOPSY Left 02/25/2004   neg   BREAST LUMPECTOMY Right 10/25/1988   CESAREAN SECTION  1981; 1983   CHOLECYSTECTOMY  02/25/1984   COLONOSCOPY  12/2019   SMALL INTESTINE SURGERY  05/26/1991   TUBAL LIGATION  02/24/1981    SOCIAL HISTORY: Social History   Socioeconomic History   Marital status: Married    Spouse name: [name]   Number of children: 2   Years of education: doctorate   Highest education level: Not on file  Occupational History   Occupation: Copy  Tobacco Use   Smoking status: Former    Current packs/day: 0.00    Average packs/day: 1 pack/day for 20.0 years (20.0 ttl pk-yrs)    Types: Cigarettes    Start date: 04/16/1973    Quit date: 04/16/1993    Years since quitting: 30.7   Smokeless tobacco: Never  Substance and Sexual Activity   Alcohol use: Not Currently    Comment: haven't drank since early 1980's   Drug use: No   Sexual activity: Yes  Other Topics Concern   Not on file  Social History Narrative   Patient is married [name], has 2 children   Patient is right handed   Caffeine consumption is rarely, drinks decaf   Social Drivers of Corporate Investment Banker Strain: Low Risk  (09/30/2023)   Received from [facility]   Overall Financial Resource Strain (CARDIA)    Difficulty of Paying Living Expenses: Not hard at all  Food Insecurity: No Food Insecurity (09/30/2023)   Received from [facility]   Hunger Vital Sign    Within the past 12 months,  you worried that your food would run out before you got the money to buy more.: Never true    Within the past 12 months, the food you bought just didn't last and you didn't have money to get more.: Never true  Transportation Needs: No Transportation Needs (09/30/2023)   Received from [facility] - Transportation    In the past 12 months, has lack of transportation kept you from medical appointments or from getting medications?: No    Lack of Transportation (Non-Medical): No  Physical Activity: Not on file  Stress: Not on file  Social Connections: Not on file  Intimate Partner Violence: Not on file    FAMILY HISTORY: Family History  Problem Relation Age of Onset   Heart disease Mother  Heart disease Father    Diabetes Brother    Diabetes Brother    Migraines Son     ALLERGIES:  is allergic to codeine, morphine  and codeine, baclofen, penicillins, sulfamethoxazole, elemental sulfur, and metformin  hcl.  MEDICATIONS:  Current Outpatient Medications  Medication Sig Dispense Refill   aspirin  325 MG tablet Take 1 tablet (325 mg total) by mouth daily. 30 tablet 2   ciprofloxacin  (CIPRO ) 500 MG tablet Take 1 tablet (500 mg total) by mouth 2 (two) times daily for 10 days. 20 tablet 0   clopidogrel  (PLAVIX ) 75 MG tablet Take 1 tablet (75 mg total) by mouth daily. 90 tablet 1   clopidogrel  (PLAVIX ) 75 MG tablet Take 1 tablet (75 mg total) by mouth daily. 90 tablet 1   Dulaglutide  (TRULICITY ) 1.5 MG/0.5ML SOAJ Inject 1.5 mg into the skin once a week. 2 mL 11   fluticasone  (FLONASE ) 50 MCG/ACT nasal spray Place 2 sprays into both nostrils daily. 16 g 1   gabapentin  (NEURONTIN ) 400 MG capsule Take 1 capsule (400 mg total) by mouth 3 (three) times daily. 270 capsule 1   gabapentin  (NEURONTIN ) 400 MG capsule Take 1 capsule (400 mg total) by mouth 3 (three) times daily. 270 capsule 1   glucose blood test strip Use to test blood sugar  3 (three) times daily. 100 each 11    insulin  degludec (TRESIBA  FLEXTOUCH) 100 UNIT/ML FlexTouch Pen Inject 30 Units into the skin in the morning.     insulin  degludec (TRESIBA  FLEXTOUCH) 100 UNIT/ML FlexTouch Pen Inject 34 Units into the skin daily. 15 mL 11   Insulin  Pen Needle (TECHLITE PLUS PEN NEEDLES) 32G X 4 MM MISC Use daily for insulin  administration 100 each 3   ketoconazole  (NIZORAL ) 2 % cream Apply 1 Application topically to groin and under arms 1 (one) to 2 (two) times daily as needed for rash. 60 g 6   levothyroxine  (SYNTHROID ) 125 MCG tablet Take 1 tablet (125 mcg total) by mouth daily Monday - Saturday. 90 tablet 3   metFORMIN  (GLUCOPHAGE ) 500 MG tablet Take 2 tablets (1,000 mg total) by mouth daily with breakfast AND 1 tablet (500 mg total) daily with supper. 270 tablet 3   metroNIDAZOLE  (METROCREAM ) 0.75 % cream Apply qd/bid to face for rosacea 45 g 11   metroNIDAZOLE  (METROCREAM ) 0.75 % cream Apply 1 Application topically to face 1 (one) to 2 (two) times daily for rosacea. 45 g 10   metroNIDAZOLE  (METROCREAM ) 0.75 % cream Apply 1 Application topically to face 1 (one) to 2 (two) times daily for rosacea. 45 g 10   pantoprazole  (PROTONIX ) 40 MG tablet Take 40 mg by mouth in the morning.     pantoprazole  (PROTONIX ) 40 MG tablet Take 1 tablet (40 mg total) by mouth in the morning. 90 tablet 1   ramipril  (ALTACE ) 2.5 MG capsule Take 2.5 mg by mouth every evening.     ramipril  (ALTACE ) 2.5 MG capsule Take 1 capsule (2.5 mg total) by mouth daily. 90 capsule 1   rosuvastatin  (CRESTOR ) 20 MG tablet Take 1 tablet (20 mg total) by mouth daily. 90 tablet 3   clopidogrel  (PLAVIX ) 75 MG tablet Take 1 tablet (75 mg total) by mouth daily. (Patient not taking: Reported on 01/07/2024) 30 tablet 0   No current facility-administered medications for this visit.    Review of Systems  Constitutional:  Positive for fatigue. Negative for appetite change, chills and fever.  HENT:   Negative for hearing loss and voice change.  Eyes:   Negative for eye problems.  Respiratory:  Negative for chest tightness, cough and shortness of breath.   Cardiovascular:  Negative for chest pain.  Gastrointestinal:  Negative for abdominal distention, abdominal pain, blood in stool, nausea and vomiting.  Endocrine: Negative for hot flashes.  Genitourinary:  Negative for difficulty urinating and frequency.   Musculoskeletal:  Negative for arthralgias.  Skin:  Negative for itching and rash.  Neurological:  Negative for extremity weakness.  Hematological:  Negative for adenopathy.  Psychiatric/Behavioral:  Negative for confusion.    PHYSICAL EXAMINATION:  Vitals:   01/07/24 1522  BP: 130/73  Pulse: 81  Resp: 18  Temp: (!) 97.1 F (36.2 C)  SpO2: 97%   Filed Weights   01/07/24 1522  Weight: 154 lb (69.9 kg)    Physical Exam Constitutional:      General: She is not in acute distress.    Comments: Patient sits in the wheelchair  HENT:     Head: Normocephalic and atraumatic.  Eyes:     General: No scleral icterus. Cardiovascular:     Rate and Rhythm: Normal rate and regular rhythm.  Pulmonary:     Effort: Pulmonary effort is normal. No respiratory distress.     Breath sounds: Normal breath sounds. No wheezing.  Abdominal:     General: There is no distension.  Musculoskeletal:     Cervical back: Normal range of motion and neck supple.  Skin:    General: Skin is warm and dry.     Findings: No erythema or rash.  Neurological:     Mental Status: She is alert and oriented to person, place, and time. Mental status is at baseline.     Comments: Chronic left-sided weakness due to history of stroke.- left lower extremity brace  Psychiatric:        Mood and Affect: Mood normal.     LABORATORY DATA:  I have reviewed the data as listed    Latest Ref Rng & Units 12/31/2023    1:15 PM 09/28/2023   12:52 PM 08/03/2023    1:46 PM  CBC  WBC 4.0 - 10.5 K/uL 6.8  8.2    Hemoglobin 12.0 - 15.0 g/dL 86.7  88.1  88.5   Hematocrit  36.0 - 46.0 % 39.9  35.7  37.7   Platelets 150 - 400 K/uL 186  206        Latest Ref Rng & Units 03/08/2021    9:38 AM 03/07/2021    3:36 PM 11/01/2020    6:42 PM  CMP  Glucose 70 - 99 mg/dL 880  841    BUN 8 - 23 mg/dL 11  10    Creatinine 9.55 - 1.00 mg/dL 9.40  9.23  9.36   Sodium 135 - 145 mmol/L 139  136    Potassium 3.5 - 5.1 mmol/L 3.7  3.7    Chloride 98 - 111 mmol/L 107  102    CO2 22 - 32 mmol/L 24  27    Calcium  8.9 - 10.3 mg/dL 9.1  9.4    Total Protein 6.5 - 8.1 g/dL  7.5    Total Bilirubin 0.3 - 1.2 mg/dL  0.4    Alkaline Phos 38 - 126 U/L  54    AST 15 - 41 U/L  20    ALT 0 - 44 U/L  15        RADIOGRAPHIC STUDIES: I have personally reviewed the radiological images as listed and agreed with the  findings in the report. No results found.

## 2024-01-07 NOTE — Assessment & Plan Note (Signed)
 Vitamin B12 207, less than 400.  Recommend B12 supplementation-1000 mcg IM injection monthly

## 2024-01-11 ENCOUNTER — Ambulatory Visit: Admitting: Physical Therapy

## 2024-01-12 ENCOUNTER — Ambulatory Visit (INDEPENDENT_AMBULATORY_CARE_PROVIDER_SITE_OTHER): Payer: PPO | Admitting: Dermatology

## 2024-01-12 ENCOUNTER — Other Ambulatory Visit: Payer: Self-pay

## 2024-01-12 ENCOUNTER — Encounter: Payer: Self-pay | Admitting: Dermatology

## 2024-01-12 DIAGNOSIS — L820 Inflamed seborrheic keratosis: Secondary | ICD-10-CM | POA: Diagnosis not present

## 2024-01-12 DIAGNOSIS — L219 Seborrheic dermatitis, unspecified: Secondary | ICD-10-CM | POA: Diagnosis not present

## 2024-01-12 DIAGNOSIS — D1801 Hemangioma of skin and subcutaneous tissue: Secondary | ICD-10-CM | POA: Diagnosis not present

## 2024-01-12 DIAGNOSIS — W908XXA Exposure to other nonionizing radiation, initial encounter: Secondary | ICD-10-CM | POA: Diagnosis not present

## 2024-01-12 DIAGNOSIS — L304 Erythema intertrigo: Secondary | ICD-10-CM

## 2024-01-12 DIAGNOSIS — L821 Other seborrheic keratosis: Secondary | ICD-10-CM

## 2024-01-12 DIAGNOSIS — Z1283 Encounter for screening for malignant neoplasm of skin: Secondary | ICD-10-CM | POA: Diagnosis not present

## 2024-01-12 DIAGNOSIS — L853 Xerosis cutis: Secondary | ICD-10-CM

## 2024-01-12 DIAGNOSIS — L570 Actinic keratosis: Secondary | ICD-10-CM | POA: Diagnosis not present

## 2024-01-12 DIAGNOSIS — D229 Melanocytic nevi, unspecified: Secondary | ICD-10-CM

## 2024-01-12 DIAGNOSIS — L814 Other melanin hyperpigmentation: Secondary | ICD-10-CM | POA: Diagnosis not present

## 2024-01-12 DIAGNOSIS — L578 Other skin changes due to chronic exposure to nonionizing radiation: Secondary | ICD-10-CM | POA: Diagnosis not present

## 2024-01-12 MED ORDER — KETOCONAZOLE 2 % EX CREA
TOPICAL_CREAM | CUTANEOUS | 6 refills | Status: AC
  Filled 2024-01-12: qty 60, 60d supply, fill #0
  Filled 2024-03-26: qty 60, 60d supply, fill #1

## 2024-01-12 NOTE — Progress Notes (Signed)
 Follow-Up Visit   Subjective  [name] is a 73 y.o. female who presents for the following: Skin Cancer Screening and Full Body Skin Exam. No personal Hx of skin cancer. [place].   Spot of concern on right cheek. Dur: months. Sore to touch.   The patient presents for Total-Body Skin Exam (TBSE) for skin cancer screening and mole check. The patient has spots, moles and lesions to be evaluated, some may be new or changing and the patient may have concern these could be cancer.    The following portions of the chart were reviewed this encounter and updated as appropriate: medications, allergies, medical history  Review of Systems:  No other skin or systemic complaints except as noted in HPI or Assessment and Plan.  Objective  Well appearing patient in no apparent distress; mood and affect are within normal limits.  A full examination was performed including scalp, head, eyes, ears, nose, lips, neck, chest, axillae, abdomen, back, buttocks, bilateral upper extremities, bilateral lower extremities, hands, feet, fingers, toes, fingernails, and toenails. All findings within normal limits unless otherwise noted below.   Relevant physical exam findings are noted in the Assessment and Plan.  Right Lower Cheek x1, L nasal tip x1, L lower elbow x1, forehead x4 (7) Erythematous thin papules/macules with gritty scale.  Left Dorsal Hand x1, L zygoma x1 (2) Erythematous keratotic or waxy stuck-on thin papule  Assessment & Plan   SKIN CANCER SCREENING PERFORMED TODAY.  ACTINIC DAMAGE - Chronic condition, secondary to cumulative UV/sun exposure - diffuse scaly erythematous macules with underlying dyspigmentation - Recommend daily broad spectrum sunscreen SPF 30+ to sun-exposed areas, reapply every 2 hours as needed.  - Staying in the shade or wearing long sleeves, sun glasses (UVA+UVB protection) and wide brim hats (4-inch brim around the entire circumference of the hat) are  also recommended for sun protection.  - Call for new or changing lesions.  LENTIGINES, SEBORRHEIC KERATOSES, HEMANGIOMAS - Benign normal skin lesions - Benign-appearing - Call for any changes  MELANOCYTIC NEVI - Tan-brown and/or pink-flesh-colored symmetric macules and papules - Benign appearing on exam today - Observation - Call clinic for new or changing moles - Recommend daily use of broad spectrum spf 30+ sunscreen to sun-exposed areas.    SEBORRHEIC KERATOSIS - Stuck-on, waxy, tan patch at right medial ankle  - Benign-appearing - Discussed benign etiology and prognosis. - Observe - Call for any changes   Xerosis - diffuse xerotic patches at chest - recommend gentle, hydrating skin care - gentle skin care handout given   SEBORRHEIC DERMATITIS Exam: Pink patches with diffuse scale at brows, forehead   Chronic and persistent condition with duration or expected duration over one year. Condition is bothersome/symptomatic for patient. Currently flared.   Seborrheic Dermatitis is a chronic persistent rash characterized by pinkness and scaling most commonly of the mid face but also can occur on the scalp (dandruff), ears; mid chest, mid back and groin.  It tends to be exacerbated by stress and cooler weather.  People who have neurologic disease may experience new onset or exacerbation of existing seborrheic dermatitis.  The condition is not curable but treatable and can be controlled.   Treatment Plan: Continue Ketoconazole  2% cream once to twice daily to forehead, eyebrows for flares.   Recommend OTC Head & Shoulders shampoo 2-3x per week, massage into scalp and let sit 3-5 minutes before rinsing.   INTERTRIGO Exam Body folds not examined today. Pt states clear  Chronic condition with duration or expected duration over one year. Currently well-controlled.     Intertrigo is a chronic recurrent rash that occurs in skin fold areas that may be associated with friction;  heat; moisture; yeast; fungus; and bacteria.  It is exacerbated by increased movement / activity; sweating; and higher atmospheric temperature.   Treatment Plan Continue ketoconazole  2% cream once to twice daily as needed.   AK (ACTINIC KERATOSIS) (7) Right Lower Cheek x1, L nasal tip x1, L lower elbow x1, forehead x4 (7) Vs ISK   Actinic keratoses are precancerous spots that appear secondary to cumulative UV radiation exposure/sun exposure over time. They are chronic with expected duration over 1 year. A portion of actinic keratoses will progress to squamous cell carcinoma of the skin. It is not possible to reliably predict which spots will progress to skin cancer and so treatment is recommended to prevent development of skin cancer.  Recommend daily broad spectrum sunscreen SPF 30+ to sun-exposed areas, reapply every 2 hours as needed.  Recommend staying in the shade or wearing long sleeves, sun glasses (UVA+UVB protection) and wide brim hats (4-inch brim around the entire circumference of the hat). Call for new or changing lesions. Destruction of lesion - Right Lower Cheek x1, L nasal tip x1, L lower elbow x1, forehead x4 (7)  Destruction method: cryotherapy   Informed consent: discussed and consent obtained   Lesion destroyed using liquid nitrogen: Yes   Region frozen until ice ball extended beyond lesion: Yes   Outcome: patient tolerated procedure well with no complications   Post-procedure details: wound care instructions given   Additional details:  Prior to procedure, discussed risks of blister formation, small wound, skin dyspigmentation, or rare scar following cryotherapy. Recommend Vaseline ointment to treated areas while healing.   INFLAMED SEBORRHEIC KERATOSIS (2) Left Dorsal Hand x1, L zygoma x1 (2) Symptomatic, irritating, patient would like treated. Destruction of lesion - Left Dorsal Hand x1, L zygoma x1 (2)  Destruction method: cryotherapy   Informed consent:  discussed and consent obtained   Lesion destroyed using liquid nitrogen: Yes   Region frozen until ice ball extended beyond lesion: Yes   Outcome: patient tolerated procedure well with no complications   Post-procedure details: wound care instructions given   Additional details:  Prior to procedure, discussed risks of blister formation, small wound, skin dyspigmentation, or rare scar following cryotherapy. Recommend Vaseline ointment to treated areas while healing.   Return in about 1 year (around 01/11/2025) for TBSE.  I, Kate Fought, CMA, am acting as scribe for Rexene Rattler, MD.   Documentation: I have reviewed the above documentation for accuracy and completeness, and I agree with the above.  Rexene Rattler, MD

## 2024-01-12 NOTE — Patient Instructions (Addendum)
 Cryotherapy Aftercare  Wash gently with soap and water everyday.   Apply Vaseline Jelly daily until healed.      Continue Ketoconazole  2% cream once to twice daily to forehead, eyebrows, body folds as needed for flares.   Recommend OTC Head & Shoulders shampoo 2-3x per week, massage into scalp and let sit 3-5 minutes before rinsing.     Gentle Skin Care Guide  1. Bathe no more than once a day.  2. Avoid bathing in hot water  3. Use a mild soap like Dove, Vanicream, Cetaphil, CeraVe. Can use Lever 2000 or Cetaphil antibacterial soap  4. Use soap only where you need it. On most days, use it under your arms, between your legs, and on your feet. Let the water rinse other areas unless visibly dirty.  5. When you get out of the bath/shower, use a towel to gently blot your skin dry, don't rub it.  6. While your skin is still a little damp, apply a moisturizing cream such as Vanicream, CeraVe, Cetaphil, Eucerin, Sarna lotion or plain Vaseline Jelly. For hands apply Neutrogena Norwegian Hand Cream or Excipial Hand Cream.  7. Reapply moisturizer any time you start to itch or feel dry.  8. Sometimes using free and clear laundry detergents can be helpful. Fabric softener sheets should be avoided. Downy Free & Gentle liquid, or any liquid fabric softener that is free of dyes and perfumes, it acceptable to use  9. If your doctor has given you prescription creams you may apply moisturizers over them      Recommend daily broad spectrum sunscreen SPF 30+ to sun-exposed areas, reapply every 2 hours as needed. Call for new or changing lesions.  Staying in the shade or wearing long sleeves, sun glasses (UVA+UVB protection) and wide brim hats (4-inch brim around the entire circumference of the hat) are also recommended for sun protection.      Melanoma ABCDEs  Melanoma is the most dangerous type of skin cancer, and is the leading cause of death from skin disease.  You are more likely to  develop melanoma if you: Have light-colored skin, light-colored eyes, or red or blond hair Spend a lot of time in the sun Tan regularly, either outdoors or in a tanning bed Have had blistering sunburns, especially during childhood Have a close family member who has had a melanoma Have atypical moles or large birthmarks  Early detection of melanoma is key since treatment is typically straightforward and cure rates are extremely high if we catch it early.   The first sign of melanoma is often a change in a mole or a new dark spot.  The ABCDE system is a way of remembering the signs of melanoma.  A for asymmetry:  The two halves do not match. B for border:  The edges of the growth are irregular. C for color:  A mixture of colors are present instead of an even brown color. D for diameter:  Melanomas are usually (but not always) greater than 6mm - the size of a pencil eraser. E for evolution:  The spot keeps changing in size, shape, and color.  Please check your skin once per month between visits. You can use a small mirror in front and a large mirror behind you to keep an eye on the back side or your body.   If you see any new or changing lesions before your next follow-up, please call to schedule a visit.  Please continue daily skin protection including broad spectrum  sunscreen SPF 30+ to sun-exposed areas, reapplying every 2 hours as needed when you're outdoors.   Staying in the shade or wearing long sleeves, sun glasses (UVA+UVB protection) and wide brim hats (4-inch brim around the entire circumference of the hat) are also recommended for sun protection.     Due to recent changes in healthcare laws, you may see results of your pathology and/or laboratory studies on MyChart before the doctors have had a chance to review them. We understand that in some cases there may be results that are confusing or concerning to you. Please understand that not all results are received at the same time and  often the doctors may need to interpret multiple results in order to provide you with the best plan of care or course of treatment. Therefore, we ask that you please give us  2 business days to thoroughly review all your results before contacting the office for clarification. Should we see a critical lab result, you will be contacted sooner.   If You Need Anything After Your Visit  If you have any questions or concerns for your doctor, please call our main line at [phone] and press option 4 to reach your doctor's medical assistant. If no one answers, please leave a voicemail as directed and we will return your call as soon as possible. Messages left after 4 pm will be answered the following business day.   You may also send us  a message via MyChart. We typically respond to MyChart messages within 1-2 business days.  For prescription refills, please ask your pharmacy to contact our office. Our fax number is [phone].  If you have an urgent issue when the clinic is closed that cannot wait until the next business day, you can page your doctor at the number below.    Please note that while we do our best to be available for urgent issues outside of office hours, we are not available 24/7.   If you have an urgent issue and are unable to reach us , you may choose to seek medical care at your doctor's office, retail clinic, urgent care center, or emergency room.  If you have a medical emergency, please immediately call 911 or go to the emergency department.  Pager Numbers  - Dr. Hester: [phone]  - Dr. Jackquline: [phone]  - Dr. Claudene: [phone]   - Dr. Raymund: [phone]  In the event of inclement weather, please call our main line at [phone] for an update on the status of any delays or closures.  Dermatology Medication Tips: Please keep the boxes that topical medications come in in order to help keep track of the instructions about where and how to use these.  Pharmacies typically print the medication instructions only on the boxes and not directly on the medication tubes.   If your medication is too expensive, please contact our office at [phone] option 4 or send us  a message through MyChart.   We are unable to tell what your co-pay for medications will be in advance as this is different depending on your insurance coverage. However, we may be able to find a substitute medication at lower cost or fill out paperwork to get insurance to cover a needed medication.   If a prior authorization is required to get your medication covered by your insurance company, please allow us  1-2 business days to complete this process.  Drug prices often vary depending on where the prescription is filled and some pharmacies may offer cheaper prices.  The  website www.goodrx.com contains coupons for medications through different pharmacies. The prices here do not account for what the cost may be with help from insurance (it may be cheaper with your insurance), but the website can give you the price if you did not use any insurance.  - You can print the associated coupon and take it with your prescription to the pharmacy.  - You may also stop by our office during regular business hours and pick up a GoodRx coupon card.  - If you need your prescription sent electronically to a different pharmacy, notify our office through [facility] or by phone at [phone] option 4.     Si Usted Necesita Algo Despus de Su Visita  Tambin puede enviarnos un mensaje a travs de Clinical Cytogeneticist. Por lo general respondemos a los mensajes de MyChart en el transcurso de 1 a 2 das hbiles.  Para renovar recetas, por favor pida a su farmacia que se ponga en contacto con nuestra oficina. Randi lakes de fax es [place] [phone].  Si tiene un asunto urgente cuando la clnica est cerrada y que no puede esperar hasta el siguiente da hbil, puede llamar/localizar a su doctor(a) al nmero  que aparece a continuacin.   Por favor, tenga en cuenta que aunque hacemos todo lo posible para estar disponibles para asuntos urgentes fuera del horario de [place], no estamos disponibles las 24 horas del da, los 7 [address]  [address] de la [place].   Si tiene un problema urgente y no puede comunicarse con nosotros, puede optar por buscar atencin mdica  en el consultorio de su doctor(a), en una clnica privada, en un centro de atencin urgente o en una sala de emergencias.  Si tiene engineer, drilling, por favor llame inmediatamente al 911 o vaya a la sala de emergencias.  Nmeros de bper  - Dr. Hester: [phone]  - Dra. Jackquline: [phone]  - Dr. Claudene: [phone]  - Dra. Kitts: [phone]  En caso de inclemencias del [place], por favor llame a nuestra lnea principal al [phone] para una actualizacin sobre el estado de cualquier retraso o cierre.  Consejos para la medicacin en dermatologa: Por favor, guarde las cajas en las que vienen los medicamentos de uso tpico para ayudarle a seguir las instrucciones sobre dnde y cmo usarlos. Las farmacias generalmente imprimen las instrucciones del medicamento slo en las cajas y no directamente en los tubos del [place].   Si su medicamento es muy caro, por favor, pngase en contacto con landry rieger llamando al [phone] y presione la opcin 4 o envenos un mensaje a travs de Clinical Cytogeneticist.   No podemos decirle cul ser su copago por los medicamentos por adelantado ya que esto es diferente dependiendo de la cobertura de su seguro. Sin embargo, es posible que podamos encontrar un medicamento sustituto a audiological scientist un formulario para que el seguro cubra el medicamento que se considera necesario.   Si se requiere una autorizacin previa para que su compaa de seguros cubra su medicamento, por favor permtanos de 1 a 2 das hbiles para completar este proceso.  Los precios de los medicamentos varan con frecuencia  dependiendo del environmental consultant de dnde se surte la receta y alguna farmacias pueden ofrecer precios ms baratos.  El sitio web www.goodrx.com tiene cupones para medicamentos de health and safety inspector. Los precios aqu no tienen en cuenta lo que podra costar con la ayuda del seguro (puede ser ms barato con su seguro), pero el sitio web puede darle el precio si no dance movement psychotherapist  ningn seguro.  - Puede imprimir el cupn correspondiente y llevarlo con su receta a la farmacia.  - Tambin puede pasar por nuestra oficina durante el horario de atencin regular y education officer, museum una tarjeta de cupones de GoodRx.  - Si necesita que su receta se enve electrnicamente a una farmacia diferente, informe a nuestra oficina a travs de MyChart de [place] o por telfono llamando al [phone] y presione la opcin 4.

## 2024-01-13 ENCOUNTER — Ambulatory Visit: Admitting: Physical Therapy

## 2024-01-15 ENCOUNTER — Other Ambulatory Visit: Payer: Self-pay

## 2024-01-15 ENCOUNTER — Encounter: Payer: Self-pay | Admitting: Dermatology

## 2024-01-18 ENCOUNTER — Ambulatory Visit: Admitting: Physical Therapy

## 2024-01-20 ENCOUNTER — Ambulatory Visit: Admitting: Physical Therapy

## 2024-01-20 ENCOUNTER — Other Ambulatory Visit: Payer: Self-pay

## 2024-01-25 ENCOUNTER — Ambulatory Visit: Admitting: Physical Therapy

## 2024-01-27 ENCOUNTER — Ambulatory Visit: Admitting: Physical Therapy

## 2024-01-29 ENCOUNTER — Other Ambulatory Visit: Payer: Self-pay

## 2024-02-01 ENCOUNTER — Ambulatory Visit: Admitting: Physical Therapy

## 2024-02-03 ENCOUNTER — Other Ambulatory Visit: Payer: Self-pay

## 2024-02-03 ENCOUNTER — Ambulatory Visit: Admitting: Physical Therapy

## 2024-02-03 DIAGNOSIS — Z8673 Personal history of transient ischemic attack (TIA), and cerebral infarction without residual deficits: Secondary | ICD-10-CM | POA: Diagnosis not present

## 2024-02-03 DIAGNOSIS — I10 Essential (primary) hypertension: Secondary | ICD-10-CM | POA: Diagnosis not present

## 2024-02-03 DIAGNOSIS — J439 Emphysema, unspecified: Secondary | ICD-10-CM | POA: Diagnosis not present

## 2024-02-03 DIAGNOSIS — I69051 Hemiplegia and hemiparesis following nontraumatic subarachnoid hemorrhage affecting right dominant side: Secondary | ICD-10-CM | POA: Diagnosis not present

## 2024-02-03 DIAGNOSIS — E7800 Pure hypercholesterolemia, unspecified: Secondary | ICD-10-CM | POA: Diagnosis not present

## 2024-02-03 DIAGNOSIS — I639 Cerebral infarction, unspecified: Secondary | ICD-10-CM | POA: Diagnosis not present

## 2024-02-04 ENCOUNTER — Inpatient Hospital Stay: Attending: Oncology

## 2024-02-04 DIAGNOSIS — E113393 Type 2 diabetes mellitus with moderate nonproliferative diabetic retinopathy without macular edema, bilateral: Secondary | ICD-10-CM | POA: Diagnosis not present

## 2024-02-05 ENCOUNTER — Inpatient Hospital Stay: Attending: Oncology

## 2024-02-05 DIAGNOSIS — E538 Deficiency of other specified B group vitamins: Secondary | ICD-10-CM | POA: Diagnosis present

## 2024-02-05 DIAGNOSIS — D509 Iron deficiency anemia, unspecified: Secondary | ICD-10-CM

## 2024-02-05 MED ORDER — CYANOCOBALAMIN 1000 MCG/ML IJ SOLN
1000.0000 ug | Freq: Once | INTRAMUSCULAR | Status: AC
  Administered 2024-02-05: 1000 ug via INTRAMUSCULAR
  Filled 2024-02-05: qty 1

## 2024-02-08 ENCOUNTER — Ambulatory Visit: Admitting: Podiatry

## 2024-02-08 ENCOUNTER — Ambulatory Visit: Admitting: Physical Therapy

## 2024-02-08 DIAGNOSIS — M79674 Pain in right toe(s): Secondary | ICD-10-CM

## 2024-02-08 DIAGNOSIS — Z794 Long term (current) use of insulin: Secondary | ICD-10-CM

## 2024-02-08 DIAGNOSIS — E1142 Type 2 diabetes mellitus with diabetic polyneuropathy: Secondary | ICD-10-CM

## 2024-02-08 DIAGNOSIS — M79675 Pain in left toe(s): Secondary | ICD-10-CM

## 2024-02-08 DIAGNOSIS — B351 Tinea unguium: Secondary | ICD-10-CM

## 2024-02-10 ENCOUNTER — Ambulatory Visit: Admitting: Physical Therapy

## 2024-02-14 ENCOUNTER — Encounter: Payer: Self-pay | Admitting: Podiatry

## 2024-02-14 NOTE — Progress Notes (Signed)
"  °  Subjective:  Patient ID: [name], female    DOB: 22-Feb-1951,  MRN: [id number]  [name] presents to clinic today for painful thick toenails that are difficult to trim. Pain interferes with ambulation. Aggravating factors include wearing enclosed shoe gear. Pain is relieved with periodic professional debridement.  Patient states she will be getting casted for a new AFO soon. Chief Complaint  Patient presents with   Nail Problem    She saw Dr. Sherial in Aug. She denies being diabetic    PCP is Sherial Bail, MD.  Allergies[1]  Review of Systems: Negative except as noted in the HPI.  Objective: No changes noted in today's physical examination. There were no vitals filed for this visit. [name] is a pleasant 73 y.o. female WD, WN in NAD. AAO x 3.  Vascular Examination: Capillary refill time immediate b/l. Vascular status intact b/l with palpable pedal pulses. Pedal hair present b/l. No pain with calf compression b/l. Skin temperature gradient WNL b/l. No cyanosis or clubbing b/l. No ischemia or gangrene noted b/l. Trace edema noted BLE.  Neurological Examination: Protective sensation diminished with 10g monofilament b/l.  Dermatological Examination: Blanchable erythema noted dorsomedial aspect left hallux. No edema, no deep tissue injury.  Pedal skin with normal turgor, texture and tone b/l.  No open wounds. No interdigital macerations.   Toenails 1-5 b/l thick, discolored, elongated with subungual debris and pain on dorsal palpation.   No hyperkeratotic nor porokeratotic lesions present on today's visit.  Musculoskeletal Examination: Dropfoot left lower extremity. Wearing AFO on left lower extremity. Pes planovalgus deformity noted b/l lower extremities. Utilizes wheelchair for mobility assistance.  Radiographs: None  Assessment/Plan: 1. Pain due to onychomycosis of toenails of both feet   2. Type 2 diabetes mellitus with diabetic polyneuropathy, with  long-term current use of insulin  [facility])   Patient was evaluated and treated. All patient's and/or POA's questions/concerns addressed on today's visit. Toenails 1-5 b/l debrided in length and girth without incident. Continue soft, supportive shoe gear daily. Report any pedal injuries to medical professional. Call office if there are any questions/concerns. -Patient/POA to call should there be question/concern in the interim.   Return in about 9 weeks (around 04/11/2024).  Delon LITTIE Merlin, DPM      [place] LOCATION: [address], [place] [address]                   Office [phone]   [place] LOCATION: [address] [place], [place] [address] Office [phone]     [1]  Allergies Allergen Reactions   Codeine Other (See Comments)    Syncope.   Morphine  And Codeine Nausea And Vomiting and Other (See Comments)    Hallucinations.   Baclofen Nausea Only and Other (See Comments)    dizziness   Penicillins Other (See Comments)    Family history   Sulfamethoxazole Itching    Hands itching    Elemental Sulfur Itching    Per patient happened years ago.   Metformin  Hcl Other (See Comments) and Diarrhea    diarrhea   "

## 2024-02-15 ENCOUNTER — Ambulatory Visit: Admitting: Physical Therapy

## 2024-02-17 ENCOUNTER — Ambulatory Visit: Admitting: Physical Therapy

## 2024-02-22 ENCOUNTER — Ambulatory Visit: Admitting: Physical Therapy

## 2024-02-24 ENCOUNTER — Ambulatory Visit: Admitting: Physical Therapy

## 2024-02-29 ENCOUNTER — Other Ambulatory Visit: Payer: Self-pay

## 2024-02-29 ENCOUNTER — Ambulatory Visit: Admitting: Physical Therapy

## 2024-03-02 ENCOUNTER — Ambulatory Visit: Admitting: Physical Therapy

## 2024-03-02 ENCOUNTER — Other Ambulatory Visit: Payer: Self-pay | Admitting: Internal Medicine

## 2024-03-02 DIAGNOSIS — J431 Panlobular emphysema: Secondary | ICD-10-CM

## 2024-03-02 DIAGNOSIS — R0602 Shortness of breath: Secondary | ICD-10-CM

## 2024-03-02 DIAGNOSIS — Z0000 Encounter for general adult medical examination without abnormal findings: Secondary | ICD-10-CM

## 2024-03-07 ENCOUNTER — Inpatient Hospital Stay

## 2024-03-07 ENCOUNTER — Ambulatory Visit: Admitting: Physical Therapy

## 2024-03-09 ENCOUNTER — Other Ambulatory Visit: Payer: Self-pay

## 2024-03-09 ENCOUNTER — Ambulatory Visit: Admitting: Physical Therapy

## 2024-03-10 ENCOUNTER — Other Ambulatory Visit: Payer: Self-pay

## 2024-03-10 ENCOUNTER — Ambulatory Visit
Admission: RE | Admit: 2024-03-10 | Discharge: 2024-03-10 | Disposition: A | Discharge status: Home / Self Care | Source: Ambulatory Visit | Attending: Internal Medicine | Admitting: Internal Medicine

## 2024-03-10 DIAGNOSIS — J431 Panlobular emphysema: Secondary | ICD-10-CM | POA: Diagnosis present

## 2024-03-10 DIAGNOSIS — R0602 Shortness of breath: Secondary | ICD-10-CM | POA: Insufficient documentation

## 2024-03-10 DIAGNOSIS — Z0000 Encounter for general adult medical examination without abnormal findings: Secondary | ICD-10-CM | POA: Insufficient documentation

## 2024-03-10 MED ORDER — CLOPIDOGREL BISULFATE 75 MG PO TABS
75.0000 mg | ORAL_TABLET | Freq: Every day | ORAL | 0 refills | Status: AC
  Filled 2024-03-10: qty 90, 90d supply, fill #0

## 2024-03-14 ENCOUNTER — Ambulatory Visit: Admitting: Physical Therapy

## 2024-03-30 ENCOUNTER — Other Ambulatory Visit: Payer: Self-pay

## 2024-03-31 ENCOUNTER — Encounter: Payer: Self-pay | Admitting: Oncology

## 2024-04-01 ENCOUNTER — Other Ambulatory Visit: Payer: Self-pay

## 2024-04-01 ENCOUNTER — Ambulatory Visit: Admitting: Podiatry

## 2024-04-01 NOTE — Telephone Encounter (Signed)
 Patient last seen on 11/13 and it was recommended for monthly B12 inj X5. Pt has only gotten injections on 11/13 and 12/12, Cancelled 1/12.   She had labs on 1/13 at Dr. Willodean office and B12 level was 591. Pt would like to know if she should continue B12 injectios here as Dr. Sherial told her that all her numbers had improved and were WNL. Please advise.

## 2024-04-05 ENCOUNTER — Ambulatory Visit: Admitting: Podiatry

## 2024-04-07 ENCOUNTER — Inpatient Hospital Stay

## 2024-05-05 ENCOUNTER — Inpatient Hospital Stay

## 2024-06-06 ENCOUNTER — Inpatient Hospital Stay

## 2024-06-30 ENCOUNTER — Inpatient Hospital Stay

## 2024-07-06 ENCOUNTER — Inpatient Hospital Stay: Admitting: Oncology

## 2024-07-06 ENCOUNTER — Inpatient Hospital Stay

## 2025-01-24 ENCOUNTER — Encounter: Admitting: Dermatology
